# Patient Record
Sex: Female | Born: 1935 | Race: White | Hispanic: No | Smoking: Never smoker
Health system: Southern US, Community
[De-identification: ages and names within clinical notes are randomized; demographics above are authoritative.]

## PROBLEM LIST (undated history)

## (undated) DIAGNOSIS — L818 Other specified disorders of pigmentation: Secondary | ICD-10-CM

## (undated) DIAGNOSIS — I779 Disorder of arteries and arterioles, unspecified: Secondary | ICD-10-CM

## (undated) DIAGNOSIS — I10 Essential (primary) hypertension: Secondary | ICD-10-CM

## (undated) DIAGNOSIS — M1990 Unspecified osteoarthritis, unspecified site: Secondary | ICD-10-CM

## (undated) DIAGNOSIS — I209 Angina pectoris, unspecified: Secondary | ICD-10-CM

## (undated) DIAGNOSIS — I509 Heart failure, unspecified: Secondary | ICD-10-CM

## (undated) DIAGNOSIS — M8580 Other specified disorders of bone density and structure, unspecified site: Secondary | ICD-10-CM

## (undated) DIAGNOSIS — E785 Hyperlipidemia, unspecified: Secondary | ICD-10-CM

## (undated) DIAGNOSIS — K219 Gastro-esophageal reflux disease without esophagitis: Secondary | ICD-10-CM

## (undated) DIAGNOSIS — I2510 Atherosclerotic heart disease of native coronary artery without angina pectoris: Secondary | ICD-10-CM

## (undated) DIAGNOSIS — I340 Nonrheumatic mitral (valve) insufficiency: Secondary | ICD-10-CM

## (undated) DIAGNOSIS — I5030 Unspecified diastolic (congestive) heart failure: Secondary | ICD-10-CM

## (undated) DIAGNOSIS — R0602 Shortness of breath: Secondary | ICD-10-CM

## (undated) HISTORY — DX: Heart failure, unspecified: I50.9

## (undated) HISTORY — DX: Disorder of arteries and arterioles, unspecified: I77.9

## (undated) HISTORY — DX: Other specified disorders of pigmentation: L81.8

## (undated) HISTORY — DX: Hyperlipidemia, unspecified: E78.5

## (undated) HISTORY — DX: Nonrheumatic mitral (valve) insufficiency: I34.0

## (undated) HISTORY — PX: CATARACT EXTRACTION: SUR2

## (undated) HISTORY — DX: Essential (primary) hypertension: I10

## (undated) HISTORY — DX: Atherosclerotic heart disease of native coronary artery without angina pectoris: I25.10

## (undated) HISTORY — DX: Other specified disorders of bone density and structure, unspecified site: M85.80

---

## 1999-11-24 HISTORY — PX: CORONARY ANGIOPLASTY WITH STENT PLACEMENT: SHX49

## 2000-06-18 ENCOUNTER — Ambulatory Visit (HOSPITAL_COMMUNITY): Admission: RE | Admit: 2000-06-18 | Discharge: 2000-06-19 | Payer: Self-pay | Admitting: Cardiovascular Disease

## 2005-12-06 ENCOUNTER — Emergency Department (HOSPITAL_COMMUNITY): Admission: EM | Admit: 2005-12-06 | Discharge: 2005-12-06 | Payer: Self-pay | Admitting: Emergency Medicine

## 2006-04-02 ENCOUNTER — Ambulatory Visit: Payer: Self-pay | Admitting: Internal Medicine

## 2006-06-18 ENCOUNTER — Ambulatory Visit: Payer: Self-pay | Admitting: Family Medicine

## 2006-07-01 ENCOUNTER — Ambulatory Visit: Payer: Self-pay | Admitting: Family Medicine

## 2006-07-30 ENCOUNTER — Ambulatory Visit: Payer: Self-pay | Admitting: Family Medicine

## 2006-08-05 ENCOUNTER — Encounter (INDEPENDENT_AMBULATORY_CARE_PROVIDER_SITE_OTHER): Payer: Self-pay | Admitting: Specialist

## 2006-08-05 ENCOUNTER — Ambulatory Visit: Payer: Self-pay | Admitting: Internal Medicine

## 2006-09-06 ENCOUNTER — Ambulatory Visit: Payer: Self-pay | Admitting: Internal Medicine

## 2006-09-17 ENCOUNTER — Ambulatory Visit: Payer: Self-pay | Admitting: Internal Medicine

## 2006-10-22 ENCOUNTER — Ambulatory Visit: Payer: Self-pay | Admitting: Internal Medicine

## 2006-11-05 ENCOUNTER — Ambulatory Visit: Payer: Self-pay | Admitting: Internal Medicine

## 2006-12-18 DIAGNOSIS — M949 Disorder of cartilage, unspecified: Secondary | ICD-10-CM

## 2006-12-18 DIAGNOSIS — M899 Disorder of bone, unspecified: Secondary | ICD-10-CM

## 2006-12-18 DIAGNOSIS — M8580 Other specified disorders of bone density and structure, unspecified site: Secondary | ICD-10-CM | POA: Insufficient documentation

## 2007-09-21 ENCOUNTER — Encounter (INDEPENDENT_AMBULATORY_CARE_PROVIDER_SITE_OTHER): Payer: Self-pay | Admitting: Family Medicine

## 2007-09-21 ENCOUNTER — Ambulatory Visit: Payer: Self-pay | Admitting: Family Medicine

## 2007-09-21 DIAGNOSIS — M25519 Pain in unspecified shoulder: Secondary | ICD-10-CM | POA: Insufficient documentation

## 2007-09-26 ENCOUNTER — Telehealth (INDEPENDENT_AMBULATORY_CARE_PROVIDER_SITE_OTHER): Payer: Self-pay | Admitting: *Deleted

## 2008-02-21 ENCOUNTER — Emergency Department (HOSPITAL_COMMUNITY): Admission: EM | Admit: 2008-02-21 | Discharge: 2008-02-21 | Payer: Self-pay | Admitting: Emergency Medicine

## 2008-11-02 ENCOUNTER — Other Ambulatory Visit: Admission: RE | Admit: 2008-11-02 | Discharge: 2008-11-02 | Payer: Self-pay | Admitting: Gynecology

## 2008-11-02 ENCOUNTER — Encounter: Payer: Self-pay | Admitting: Gynecology

## 2008-11-02 ENCOUNTER — Ambulatory Visit: Payer: Self-pay | Admitting: Gynecology

## 2008-11-05 ENCOUNTER — Encounter: Payer: Self-pay | Admitting: Internal Medicine

## 2008-12-05 DIAGNOSIS — K5730 Diverticulosis of large intestine without perforation or abscess without bleeding: Secondary | ICD-10-CM | POA: Insufficient documentation

## 2008-12-05 DIAGNOSIS — J45909 Unspecified asthma, uncomplicated: Secondary | ICD-10-CM | POA: Insufficient documentation

## 2008-12-05 DIAGNOSIS — E785 Hyperlipidemia, unspecified: Secondary | ICD-10-CM | POA: Insufficient documentation

## 2008-12-05 DIAGNOSIS — K921 Melena: Secondary | ICD-10-CM | POA: Insufficient documentation

## 2008-12-10 ENCOUNTER — Ambulatory Visit: Payer: Self-pay | Admitting: Gynecology

## 2008-12-10 ENCOUNTER — Ambulatory Visit: Payer: Self-pay | Admitting: Internal Medicine

## 2009-11-08 ENCOUNTER — Ambulatory Visit: Payer: Self-pay | Admitting: Gynecology

## 2009-11-08 ENCOUNTER — Other Ambulatory Visit: Admission: RE | Admit: 2009-11-08 | Discharge: 2009-11-08 | Payer: Self-pay | Admitting: Gynecology

## 2010-12-12 ENCOUNTER — Other Ambulatory Visit
Admission: RE | Admit: 2010-12-12 | Discharge: 2010-12-12 | Payer: Self-pay | Source: Home / Self Care | Admitting: Gynecology

## 2010-12-12 ENCOUNTER — Ambulatory Visit
Admission: RE | Admit: 2010-12-12 | Discharge: 2010-12-12 | Payer: Self-pay | Source: Home / Self Care | Attending: Gynecology | Admitting: Gynecology

## 2010-12-12 ENCOUNTER — Other Ambulatory Visit: Payer: Self-pay | Admitting: Gynecology

## 2010-12-25 ENCOUNTER — Ambulatory Visit (INDEPENDENT_AMBULATORY_CARE_PROVIDER_SITE_OTHER): Payer: Medicare Other | Admitting: Gynecology

## 2010-12-25 ENCOUNTER — Ambulatory Visit (INDEPENDENT_AMBULATORY_CARE_PROVIDER_SITE_OTHER): Payer: Medicare Other | Admitting: Cardiovascular Disease

## 2010-12-25 ENCOUNTER — Other Ambulatory Visit: Payer: Self-pay | Admitting: Gynecology

## 2010-12-25 DIAGNOSIS — N72 Inflammatory disease of cervix uteri: Secondary | ICD-10-CM

## 2010-12-25 DIAGNOSIS — I2510 Atherosclerotic heart disease of native coronary artery without angina pectoris: Secondary | ICD-10-CM

## 2010-12-25 DIAGNOSIS — I119 Hypertensive heart disease without heart failure: Secondary | ICD-10-CM

## 2010-12-25 DIAGNOSIS — N7689 Other specified inflammation of vagina and vulva: Secondary | ICD-10-CM

## 2011-04-10 NOTE — Cardiovascular Report (Signed)
[place]  Patient:    [name], [name]                    MRN: [id number] Proc. Date: 06/18/00 Adm. Date:  [id number] Attending:  Koren Bound CC:         Cardiac Catheterization Laboratory             Gabriel Earing, M.D.                        Cardiac Catheterization  INDICATIONS:  [name] is a 77 year old female who was referred with symptoms of unstable angina.  She is referred for heart catheterization for further evaluation.  PROCEDURES:  Left heart catheterization with percutaneous transluminal coronary angioplasty and stenting of her right coronary artery.  DESCRIPTION OF PROCEDURE:  The right femoral artery was easily cannulated using the modified Seldinger technique.  HEMODYNAMICS:  The left ventricular pressure is 110/15 with an aortic pressure of 116/58.  ANGIOGRAPHY:  The left main coronary artery is relatively smooth and normal.  The left anterior descending artery has mild irregularities between 10% and 20%.  There is several moderate sized diagonal branches which have only minor luminal irregularities.  The LAD gives off two diagonal branches and then tapers fairly quickly to a fairly small branch.  It gives off multiple septal branches, each of which are normal.  The left circumflex artery is a moderate to large vessel.  There is a proximal 30% stenosis which is not flow-limiting.  The circumflex artery gives off a first obtuse marginal artery which has a 30-40% stenosis in the proximal aspect.  The distal left circumflex has a 50% stenosis just after giving off the first OM.  The left circumflex artery gives off a posterolateral segment artery which is unremarkable.  The right coronary artery is large and dominant.  There is a tight 99% stenosis in the mid vessel.  There is TIMI grade 2 flow down this vessel.  The posterior descending artery and posterolateral segment artery have only minor luminal  irregularities.  There appears to be a 30% stenosis just proximal fibrillation to this tight 99% stenosis.  LEFT VENTRICULOGRAM:  The left ventriculogram was performed in a 30 RAO position.  It reveals overall well-preserved left ventricular systolic function.  There is mild hypokinesis involving the inferior wall.  There is mild mitral regurgitation.  PERCUTANEOUS TRANSLUMINAL CORONARY ANGIOPLASTY:  Because of the patients severe coronary artery disease, we elected to proceed with angioplasty and stent procedure.  The patient was given 4100 units of heparin.  The ACT was 268 seconds.  A double bolus Integrilin drip was given.  The right coronary artery was engaged using a 7 [place] Judkins right 4 guide.  The right coronary artery was easily wired under fluoroscopy using a Patriot guidewire.  A 3.75 x 15 mm Maverick balloon was positioned across the stenosis.  It was inflated once up to 4 atmospheres for 40 seconds.  This resulted in a greatly improved vessel lumen with approximately a 50% residual stenosis.  There was no evidence of edge dissection.  A followup angiography revealed that the plaque burden extended approximately 10 mm up beyond the original stenosis.  At this point, a 3.5 x 23 mm Tetra stent was positioned across the whole length of the plaque. It was deployed at 12 atmospheres for 55 seconds which resulted in a very nice angiographic lumen.  It did appear that  there was a slight dog bone type stenosis in the middle of the stent and along the proximal edge.  At this point the 3.75 x 15 mm Maverick was repositioned back into the midportion of the stent.  It was inflated up to 10 atmospheres for 31 seconds in the mid stent and then pulled back to the proximal edge.  It was inflated there at 10 atmospheres for 30 seconds as well.  This gives a final size of 3.95 mm. Followup angiography revealed a very nice lumen with a nice step-up and step-down.  There was TIMI grade 3  flow.  There was no evidence of edge dissection.  The patient tolerated the procedure quite well.  COMPLICATIONS:  None.  CONCLUSIONS: 1. Successful percutaneous transluminal coronary angioplasty and stenting    of the right coronary artery. 2. Mild irregularities involving the other coronary arteries. DD:  06/18/00 TD:  06/19/00 Job: [id number]

## 2011-04-10 NOTE — Assessment & Plan Note (Signed)
[facility]                             PULMONARY OFFICE NOTE   NAME:HURLOCKERBreyana, [name]                    MRN:          [id number]  DATE:10/22/2006                            DOB:          1928/06/30    HISTORY:  This is a 77 year old white female initially seen on September 06, 2006 for a cough that dates back since she was 77 years old,  associated with nasal congestion and sensation of post-nasal drainage.  I recommended a sinus CT scan, which has returned negative for  significant abnormalities, effective September 17, 2006, and recommended  empiric treatment directed at rhinitis and reflux, as well as cyclical  coughing (see previous evaluation September 06, 2006).   She returns today stating she is only a little bit better and was not  able to actually take the medications as they were recommended (see  comments below).  She did take Protonix before breakfast only, not  before supper, and did not stop the Fosamax as recommended.   MEDICATIONS AT PRESENT:  Still consist of Fosamax and atenolol, as well  as Diovan, aspirin, and Rhinabid.   PHYSICAL EXAMINATION:  She is a depressed-appearing ambulatory white  female with somewhat of a helpless, hopeless affect and attitude.  VITAL SIGNS:  Stable vital signs.  HEENT:  Minimal turbinate edema.  Oropharynx is clear.  There is no  evidence of significant post-nasal drainage or cobblestoning.  NECK:  Supple without cervical adenopathy or tenderness. Trachea is  midline.  No thyromegaly.  LUNGS:  Fields are perfectly clear bilaterally to auscultation and  percussion.  There is no cough or wheeze on inspiratory or expiratory  maneuvers.  CARDIAC:  Regular rate and rhythm without murmur, gallop, or rub.  ABDOMEN:  Soft and benign.  EXTREMITIES:  Warm without calf tenderness, cyanosis, clubbing, or  edema.   IMPRESSION:  Cyclical upper airway coughing that is somewhat better  with treatment directed at  rhinitis and reflux, but she really did not  get the message that I hoped she would in terms of the nature of  empiric treatment for cyclical coughing, and only followed half the  instructions she was given.   I, therefore, spent an extra 15 to 20 minutes with her today going over  the same information I did on the previous visit, including the eye  doctor analogy to help her understand that her response to therapy will  depend on a successful outcome for treatment of cough that dates back to  age 55 will depend on her being straight-forward about not only her  taking the medicines I recommend consistently than returning here for  the next step using the 14-step algorithm designed by Dr. Stark Falls at [facility] to whom I offered to refer her  (having never encountered a patient before who was coughing since the  age of 59).   However, she agreed to try to work with me and, therefore, I recommended  the following.  Rather than redesign the wheel I simply showed her the instructions  she received last time and asked her  to take the medicines exactly as  they were prescribed.  Namely, I have asked her to stop the Fosamax for  now and take Protonix consistently before meals twice daily (enough  samples given for the next 2 weeks), to take Drixoral Cold and Allergy  b.i.d. (because she cannot afford Bromfed PD), and to use a 6-day course  of prednisone to gain control over the inflammatory component to the  problem.  Followup will be in 2 weeks, at which time the next logical  step in the workup would be to consider adding Reglan to her regimen,  and also doing more in terms of eliminating the cycle of coughing with  perhaps a course of tramadol and, perhaps, the next step after that a  methacholine challenge step to rule out asthma (but only after reflux  and rhinitis have been treated aggressively consistently).     Charlaine Dalton. Sherene Sires, MD, [facility]  Electronically  Signed    MBW/MedQ  DD: 10/22/2006  DT: 10/24/2006  Job #: [id number]   cc:   Leanne Chang, M.D.

## 2011-04-10 NOTE — Assessment & Plan Note (Signed)
[facility]                               PULMONARY OFFICE NOTE   NAME:[name], [name]                    MRN:          [id number]  DATE:09/06/2006                            DOB:          1928-03-01    HISTORY:  A 77 year old white female who states that she has had cough on  and off since she was 77 years old associated with nasal congestion and a  sensation of postnasal drainage that seems somewhat better from shots that  she received from Dr. Colvin Caroli from 1971 to 1991 and definitely worse since  May of 2007 with now daily dry coughing that is worse as the day goes on.  It is not necessarily waking her from her sleep and associated with only a  minimal increase in nasal congestion but a sensation of something draining  down the back of her throat.  She actually does not bring up excessive  mucus or having any nocturnal disturbance once she gets to sleep related to  the complaint.  She also denies any associated dyspnea, chest pain, fevers,  chills, sweats, without any PND, leg swelling, or overt reflux or sinus  symptoms.   She has, so far, failed to respond to various nasal preparations (she could  not tell me which ones but says nothing works or reflux medicines).  She  remembers she took Protonix and it did not help and did the best with  Allerest, which she said she could not take because it made her muscles  ache.  Does not seem to improve as much with Claritin.   PAST MEDICAL HISTORY:  Significant for:  1. Asthma.  2. Hyperlipidemia.  3. Sinus problems.   MEDICATIONS:  Diovan, atenolol, aspirin, and Fosamax for about 4 years.   SOCIAL HISTORY:  She has never smoked.  She denies any unusual travel, pet,  or hobby exposure.   FAMILY HISTORY:  Significant for allergies in her daughter and her father.   REVIEW OF SYSTEMS:  Taken in detail on the worksheet, significant for  problems as outlined above.   PHYSICAL EXAMINATION:  This is  an anxious white female who had a very  difficult time answering questions in a straight forward fashion.  She is afebrile on vital signs.  HEENT:  Unremarkable.  She is edentulous.  There is mild to moderate  turbinate edema with minimal pallor.  Ear canals clear bilaterally.  NECK:  Supple without cervical adenopathy or tenderness.  Trachea is midline  with no megaly.  LUNGS:  Fields are perfectly clear bilaterally to auscultation and  percussion with no cough elicited on inspiratory, expiratory maneuvers.  HEART:  Regular rhythm without murmur, gallop, or rub.  ABDOMEN:  Soft, benign.  EXTREMITIES:  Warm without calf tenderness, cyanosis, clubbing, or edema.   Chest x-ray was performed by Dr. Blossom Hoops within the last month and is  reportedly normal but not available.   Sinus CT scan was requested today.   IMPRESSION:  Chronic cough in a background setting of perennial rhinitis for  which Allerest seemed to be the best treatment but cannot  be tolerated.  This raises the issue why suddenly, given the background pattern of  rhinitis, she is now evolved to a refractory cough.  This would either be  because the rhinitis is out of control (which does not seem to be the case  clinically since she can sleep without excessive mucus drainage) or she has  a second problem like reflux that is exacerbated by coughing.   That is, I suspect that perennial rhinitis has caused upper airway cough  syndrome which is exacerbated by the fact that she is coughing so much she  has secondary reflux.   I attempted to help the patient understand the complexity of managing  chronic cough but made very little headway using any of the usual analogies  I use and could not seem to get the patient to understand the nature of  empiric trials of therapy or sense that she was going to enthusiastically  embrace anything that I had to say today.  However, I did the best I could  to reach an understanding with her  that regarding the nature of empiric  therapy based on the recently published cough guidelines and recommended the  following specifics today:   1. Take Protonix every single day before breakfast and before supper for      the next 2 weeks and then return here for followup while eliminating      Fosamax from her regimen (since it may be contributing to reflux) in      the short run only.  2. Tried to help her understand the cyclical nature of her chronic cough      with the recommendation that she use prednisone for 6 days only to gain      control of inflammation caused by the cough and suppress postnasal drip      syndrome with Bromfed-PD 1 b.i.d. (since she has found that Claritin, a      nonsedating, and therefore a non-anticholinergic antihistamine) is not      effective in controlling the cough.  3. Followup will be in 2 weeks with a sinus CT scan to be reviewed in the      meantime.            ______________________________  [name], MD, [facility]      MBW/MedQ  DD:  09/06/2006  DT:  09/07/2006  Job #:  [id number]   cc:   Leanne Chang, M.D.

## 2011-04-10 NOTE — Assessment & Plan Note (Signed)
[facility]                             PULMONARY OFFICE NOTE   NAME:[name], [name]                    MRN:          [id number]  DATE:11/05/2006                            DOB:          February 13, 1928    HISTORY OF PRESENT ILLNESS:  The patient is a 77 year old white female  patient of Dr. Thurston Hole who has a known history of cyclical upper airway  coughing that returns for a 2-week followup.  Last visit, the patient  had been started on the prednisone taper, adding in Drixoral Cold and  Allergy and Protonix with increase up to twice a day.  The patient  returns today reporting that she has not had any significant improvement  in her symptoms.  Still feels like she has a tickle in the back of her  throat requiring her to cough quite often.  The patient did undergo a CT  of the sinuses in October which was essentially unremarkable and a  previous endoscopy in September 2007 which showed a normal exam.   PAST MEDICAL HISTORY:  The patient denies any purulent sputum, fever,  chest pain, orthopnea, PND, or leg swelling.   PAST MEDICAL HISTORY:  Reviewed.   CURRENT MEDICATIONS:  Reviewed.   PHYSICAL EXAM:  The patient is a pleasant female in no acute distress.  Temperature 100.7.  Blood pressure is 128/64.  O2 saturation is 94% on  room air.  HEENT:  Nasal mucosa was somewhat pale, nontender sinuses to percussion.  Posterior pharynx clear.  NECK:  Supple without adenopathy.  No JVD.  Lung sounds are clear without any wheezes.  No crackles.  CARDIAC:  Regular rate and rhythm.  ABDOMEN:  Soft, benign.  EXTREMITIES:  Warm without any edema.   IMPRESSION AND PLAN:  Upper airway cyclical cough.  The patient is to  continue on her aggressive cough suppressive regimen.  She will add in  Delsym 2 teaspoons twice daily and may use tramadol q. 4 p.r.n. to stop  coughing.  She will continue on Protonix twice daily and  Drixoral Cold and Allergy as needed for  postnasal drip and throat  clearing.  The patient will continue to hold on Fosamax and recheck here  in 2-3 weeks with Dr. Sherene [name] or sooner if needed.      Rubye Oaks, NP  Electronically Signed      [name]. Cynthia Sires, MD, [facility]  Electronically Signed   TP/MedQ  DD: 11/05/2006  DT: 11/05/2006  Job #: [id number]

## 2011-04-21 ENCOUNTER — Other Ambulatory Visit: Payer: Self-pay | Admitting: Cardiovascular Disease

## 2011-04-21 NOTE — Telephone Encounter (Signed)
Chart said dose is 25mg  and request is 50mg , msg left to call us with clarification.Alfonso Ramus RN

## 2011-04-22 ENCOUNTER — Other Ambulatory Visit: Payer: Self-pay | Admitting: *Deleted

## 2011-04-22 MED ORDER — ATENOLOL 50 MG PO TABS: 50.0000 mg | ORAL_TABLET | Freq: Every day | ORAL | Status: DC

## 2011-04-22 NOTE — Telephone Encounter (Signed)
Spoke with pharmacy and the pt has been on tenormin 50mg  daily for a long time, no gaps in refills no breaking tab in half, order refilled as faxed. Unable to reach pt.Alfonso Ramus RN

## 2011-04-22 NOTE — Telephone Encounter (Signed)
msg left for her to return call re med dose.Alfonso Ramus RN

## 2011-04-24 ENCOUNTER — Telehealth: Payer: Self-pay | Admitting: Cardiovascular Disease

## 2011-04-24 NOTE — Telephone Encounter (Signed)
PT SAID SOMEONE CALLED HER FROM OUR # AND SHE IS NOT SURE WHY. WANTS A CALL BACK.

## 2011-05-21 ENCOUNTER — Other Ambulatory Visit: Payer: Self-pay | Admitting: Cardiology

## 2011-05-21 NOTE — Telephone Encounter (Signed)
Med refill

## 2011-05-22 ENCOUNTER — Encounter: Payer: Self-pay | Admitting: *Deleted

## 2011-05-26 ENCOUNTER — Other Ambulatory Visit: Payer: Self-pay | Admitting: *Deleted

## 2011-05-26 DIAGNOSIS — E785 Hyperlipidemia, unspecified: Secondary | ICD-10-CM

## 2011-05-28 ENCOUNTER — Other Ambulatory Visit: Payer: Self-pay | Admitting: Cardiovascular Disease

## 2011-05-28 ENCOUNTER — Encounter: Payer: Self-pay | Admitting: Cardiovascular Disease

## 2011-05-28 ENCOUNTER — Other Ambulatory Visit (INDEPENDENT_AMBULATORY_CARE_PROVIDER_SITE_OTHER): Payer: Medicare Other | Admitting: *Deleted

## 2011-05-28 ENCOUNTER — Ambulatory Visit (INDEPENDENT_AMBULATORY_CARE_PROVIDER_SITE_OTHER): Payer: Medicare Other | Admitting: Cardiovascular Disease

## 2011-05-28 VITALS — BP 138/80 | HR 60 | Wt 146.0 lb

## 2011-05-28 DIAGNOSIS — E785 Hyperlipidemia, unspecified: Secondary | ICD-10-CM

## 2011-05-28 DIAGNOSIS — I2510 Atherosclerotic heart disease of native coronary artery without angina pectoris: Secondary | ICD-10-CM | POA: Insufficient documentation

## 2011-05-28 LAB — BASIC METABOLIC PANEL
CO2: 29 mEq/L (ref 19–32)
Calcium: 9 mg/dL (ref 8.4–10.5)
Chloride: 101 mEq/L (ref 96–112)
Sodium: 136 mEq/L (ref 135–145)

## 2011-05-28 LAB — LIPID PANEL
HDL: 44.6 mg/dL (ref >39.00)
Total CHOL/HDL Ratio: 5
VLDL: 19.2 mg/dL (ref 0.0–40.0)

## 2011-05-28 LAB — LDL CHOLESTEROL, DIRECT: Direct LDL: 155.6 mg/dL

## 2011-05-28 LAB — HEPATIC FUNCTION PANEL
Alkaline Phosphatase: 43 U/L (ref 39–117)
Bilirubin, Direct: 0 mg/dL (ref 0.0–0.3)
Total Bilirubin: 0.5 mg/dL (ref 0.3–1.2)
Total Protein: 7.3 g/dL (ref 6.0–8.3)

## 2011-05-28 NOTE — Assessment & Plan Note (Signed)
Mrs. [name] is doing very well. She has not had any episodes of angina. She still remains fairly active. We'll continue with her same medications. I'll see her again in 6 months for office visit, lipid profile, basic metabolic profile, and HFP.

## 2011-05-28 NOTE — Progress Notes (Signed)
[name] Date of Birth  05-30-1928 [facility] [address]     [address] [place], [place]  [address] [phone]  Fax  [phone]  History of Present Illness:  Pt has a history of CAD - s/p stenting of the RCA in 2001.  No recent episodes of angina.  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  She gets fair amount of exercise.   Current Outpatient Prescriptions on File Prior to Visit  Medication Sig Dispense Refill  . aspirin 81 MG tablet Take 81 mg by mouth daily.        Marland Kitchen DIOVAN 160 MG tablet TAKE ONE TABLET BY MOUTH EVERY DAY  30 each  PRN  . DISCONTD: atenolol (TENORMIN) 50 MG tablet Take 1 tablet (50 mg total) by mouth daily.  90 tablet  1    Allergies  Allergen Reactions  . Codeine   . Crestor (Rosuvastatin Calcium)   . Pantoprazole Sodium     REACTION: cough  . Pravachol   . Zocor (Simvastatin - High Dose)     Past Medical History  Diagnosis Date  . Hypertension   . Coronary artery disease     status post PTCA and stenting of right coronary artery  . Asthma   . CHF (congestive heart failure)     Past Surgical History  Procedure Date  . Coronary angioplasty with stent placement     coronary angioplasty & stenting of right coronary artery  -- Mild irregularities involving the other coronary arteries    History  Smoking status  . Never Smoker   Smokeless tobacco  . Not on file    History  Alcohol Use No    Family History  Problem Relation Age of Onset  . Coronary artery disease Mother   . Cancer Brother   . Cancer Brother   . Cancer Son   . Cancer Son   . Cancer Son   . Diabetes Child     Reviw of Systems:  Reviewed in the HPI.  All other systems are negative.  Physical Exam: BP 138/80  Pulse 60  Wt 146 lb (66.225 kg) The patient is alert and oriented x 3.  The mood and affect are normal.   Skin: warm and dry.  Color is normal.    HEENT:    the sclera are nonicteric.  The mucous membranes are moist.  The carotids are 2+ without bruits.  There is no thyromegaly.  There is no JVD.    Lungs: clear.  The chest wall is non tender.    Heart: regular rate with a normal S1 and S2.  There are no murmurs, gallops, or rubs. The PMI is not displaced.     Abdomin: good bowel sounds.  There is no guarding or rebound.  There is no hepatosplenomegaly or tenderness.  There are no masses.   Extremities:  no clubbing, cyanosis, or edema.  The legs are without rashes.  The distal pulses are intact.   Neuro:  Cranial nerves II - XII are intact.  Motor and sensory functions are intact.    The gait is normal.  Assessment / Plan:

## 2011-06-01 ENCOUNTER — Telehealth: Payer: Self-pay | Admitting: *Deleted

## 2011-06-01 NOTE — Telephone Encounter (Signed)
Spoke w/ grand daughter pt at work, told her all her results were good and to call back with questions, i had tried several attempts prior.

## 2011-08-17 LAB — POCT I-STAT, CHEM 8
BUN: 14
Chloride: 100
Potassium: 4
Sodium: 138

## 2011-08-17 LAB — CBC
HCT: 36.4
Hemoglobin: 12.5
MCV: 90.8
Platelets: 149 — ABNORMAL LOW
WBC: 9.7

## 2011-08-17 LAB — POCT CARDIAC MARKERS
CKMB, poc: 1
Myoglobin, poc: 65
Operator id: 294521
Troponin i, poc: <0.05
Troponin i, poc: <0.05

## 2011-08-17 LAB — D-DIMER, QUANTITATIVE: D-Dimer, Quant: 2.27 — ABNORMAL HIGH

## 2011-11-25 ENCOUNTER — Inpatient Hospital Stay (HOSPITAL_COMMUNITY)
Admission: EM | Admit: 2011-11-25 | Discharge: 2011-11-29 | DRG: 815 | Disposition: A | Discharge status: Home / Self Care | Payer: No Typology Code available for payment source | Attending: General Surgery | Admitting: General Surgery

## 2011-11-25 ENCOUNTER — Other Ambulatory Visit: Payer: Self-pay

## 2011-11-25 ENCOUNTER — Encounter (HOSPITAL_COMMUNITY): Payer: Self-pay | Admitting: *Deleted

## 2011-11-25 ENCOUNTER — Emergency Department (HOSPITAL_COMMUNITY): Payer: No Typology Code available for payment source

## 2011-11-25 DIAGNOSIS — K921 Melena: Secondary | ICD-10-CM

## 2011-11-25 DIAGNOSIS — S060XAA Concussion with loss of consciousness status unknown, initial encounter: Secondary | ICD-10-CM | POA: Diagnosis present

## 2011-11-25 DIAGNOSIS — S36039A Unspecified laceration of spleen, initial encounter: Secondary | ICD-10-CM

## 2011-11-25 DIAGNOSIS — K5730 Diverticulosis of large intestine without perforation or abscess without bleeding: Secondary | ICD-10-CM

## 2011-11-25 DIAGNOSIS — S060X9A Concussion with loss of consciousness of unspecified duration, initial encounter: Secondary | ICD-10-CM

## 2011-11-25 DIAGNOSIS — Z833 Family history of diabetes mellitus: Secondary | ICD-10-CM

## 2011-11-25 DIAGNOSIS — S0101XA Laceration without foreign body of scalp, initial encounter: Secondary | ICD-10-CM

## 2011-11-25 DIAGNOSIS — Z8249 Family history of ischemic heart disease and other diseases of the circulatory system: Secondary | ICD-10-CM

## 2011-11-25 DIAGNOSIS — R079 Chest pain, unspecified: Secondary | ICD-10-CM

## 2011-11-25 DIAGNOSIS — S36030A Superficial (capsular) laceration of spleen, initial encounter: Principal | ICD-10-CM | POA: Diagnosis present

## 2011-11-25 DIAGNOSIS — S2691XA Contusion of heart, unspecified with or without hemopericardium, initial encounter: Secondary | ICD-10-CM

## 2011-11-25 DIAGNOSIS — I517 Cardiomegaly: Secondary | ICD-10-CM

## 2011-11-25 DIAGNOSIS — K219 Gastro-esophageal reflux disease without esophagitis: Secondary | ICD-10-CM

## 2011-11-25 DIAGNOSIS — S0100XA Unspecified open wound of scalp, initial encounter: Secondary | ICD-10-CM | POA: Diagnosis present

## 2011-11-25 DIAGNOSIS — Z7982 Long term (current) use of aspirin: Secondary | ICD-10-CM

## 2011-11-25 DIAGNOSIS — Z888 Allergy status to other drugs, medicaments and biological substances status: Secondary | ICD-10-CM

## 2011-11-25 DIAGNOSIS — S2242XA Multiple fractures of ribs, left side, initial encounter for closed fracture: Secondary | ICD-10-CM | POA: Diagnosis present

## 2011-11-25 DIAGNOSIS — I2510 Atherosclerotic heart disease of native coronary artery without angina pectoris: Secondary | ICD-10-CM

## 2011-11-25 DIAGNOSIS — M899 Disorder of bone, unspecified: Secondary | ICD-10-CM

## 2011-11-25 DIAGNOSIS — S060X0A Concussion without loss of consciousness, initial encounter: Secondary | ICD-10-CM | POA: Diagnosis present

## 2011-11-25 DIAGNOSIS — Z9861 Coronary angioplasty status: Secondary | ICD-10-CM

## 2011-11-25 DIAGNOSIS — I10 Essential (primary) hypertension: Secondary | ICD-10-CM | POA: Diagnosis present

## 2011-11-25 DIAGNOSIS — E785 Hyperlipidemia, unspecified: Secondary | ICD-10-CM

## 2011-11-25 DIAGNOSIS — Z8781 Personal history of (healed) traumatic fracture: Secondary | ICD-10-CM | POA: Diagnosis present

## 2011-11-25 DIAGNOSIS — Z79899 Other long term (current) drug therapy: Secondary | ICD-10-CM

## 2011-11-25 DIAGNOSIS — M25519 Pain in unspecified shoulder: Secondary | ICD-10-CM

## 2011-11-25 DIAGNOSIS — S2249XA Multiple fractures of ribs, unspecified side, initial encounter for closed fracture: Secondary | ICD-10-CM

## 2011-11-25 DIAGNOSIS — M949 Disorder of cartilage, unspecified: Secondary | ICD-10-CM | POA: Diagnosis present

## 2011-11-25 DIAGNOSIS — Y92410 Unspecified street and highway as the place of occurrence of the external cause: Secondary | ICD-10-CM

## 2011-11-25 DIAGNOSIS — IMO0002 Reserved for concepts with insufficient information to code with codable children: Secondary | ICD-10-CM | POA: Diagnosis present

## 2011-11-25 DIAGNOSIS — J45909 Unspecified asthma, uncomplicated: Secondary | ICD-10-CM

## 2011-11-25 HISTORY — DX: Unspecified laceration of spleen, initial encounter: S36.039A

## 2011-11-25 LAB — COMPREHENSIVE METABOLIC PANEL
AST: 33 U/L (ref 0–37)
Albumin: 3.2 g/dL — ABNORMAL LOW (ref 3.5–5.2)
Calcium: 8.8 mg/dL (ref 8.4–10.5)
Creatinine, Ser: 0.77 mg/dL (ref 0.50–1.10)

## 2011-11-25 LAB — CARDIAC PANEL(CRET KIN+CKTOT+MB+TROPI)
Relative Index: 2.4 (ref 0.0–2.5)
Total CK: 376 U/L — ABNORMAL HIGH (ref 7–177)
Total CK: 395 U/L — ABNORMAL HIGH (ref 7–177)

## 2011-11-25 LAB — CBC
HCT: 34.1 % — ABNORMAL LOW (ref 36.0–46.0)
Hemoglobin: 12.4 g/dL (ref 12.0–15.0)
MCH: 30.8 pg (ref 26.0–34.0)
MCHC: 32.6 g/dL (ref 30.0–36.0)
MCV: 93.8 fL (ref 78.0–100.0)
RBC: 4.02 MIL/uL (ref 3.87–5.11)
RDW: 13.4 % (ref 11.5–15.5)
WBC: 11.2 10*3/uL — ABNORMAL HIGH (ref 4.0–10.5)

## 2011-11-25 LAB — GLUCOSE, CAPILLARY: Glucose-Capillary: 140 mg/dL — ABNORMAL HIGH (ref 70–99)

## 2011-11-25 LAB — POCT I-STAT, CHEM 8
BUN: 9 mg/dL (ref 6–23)
Calcium, Ion: 1.14 mmol/L (ref 1.12–1.32)
Chloride: 99 mEq/L (ref 96–112)
Glucose, Bld: 148 mg/dL — ABNORMAL HIGH (ref 70–99)
TCO2: 30 mmol/L (ref 0–100)

## 2011-11-25 LAB — MRSA PCR SCREENING: MRSA by PCR: NEGATIVE

## 2011-11-25 LAB — URINALYSIS, MICROSCOPIC ONLY
Bilirubin Urine: NEGATIVE
Glucose, UA: NEGATIVE mg/dL
Ketones, ur: NEGATIVE mg/dL
Leukocytes, UA: NEGATIVE
Specific Gravity, Urine: 1.01 (ref 1.005–1.030)
pH: 7.5 (ref 5.0–8.0)

## 2011-11-25 LAB — TROPONIN I: Troponin I: <0.3 ng/mL (ref <0.30)

## 2011-11-25 LAB — LACTIC ACID, PLASMA: Lactic Acid, Venous: 2.4 mmol/L — ABNORMAL HIGH (ref 0.5–2.2)

## 2011-11-25 LAB — SAMPLE TO BLOOD BANK

## 2011-11-25 LAB — PROTIME-INR: Prothrombin Time: 12.4 seconds (ref 11.6–15.2)

## 2011-11-25 MED ORDER — TETANUS-DIPHTH-ACELL PERTUSSIS 5-2.5-18.5 LF-MCG/0.5 IM SUSP
INTRAMUSCULAR | Status: AC
  Administered 2011-11-25: 0.5 mL via INTRAMUSCULAR
  Filled 2011-11-25: qty 0.5

## 2011-11-25 MED ORDER — HYDROCODONE-ACETAMINOPHEN 5-325 MG PO TABS
1.0000 | ORAL_TABLET | ORAL | Status: DC | PRN
  Administered 2011-11-25 – 2011-11-29 (×15): 1 via ORAL
  Filled 2011-11-25 (×14): qty 1

## 2011-11-25 MED ORDER — ONDANSETRON HCL 4 MG PO TABS
4.0000 mg | ORAL_TABLET | Freq: Four times a day (QID) | ORAL | Status: DC | PRN
  Administered 2011-11-25: 4 mg via ORAL
  Filled 2011-11-25: qty 1

## 2011-11-25 MED ORDER — OLMESARTAN MEDOXOMIL 20 MG PO TABS
20.0000 mg | ORAL_TABLET | Freq: Every day | ORAL | Status: DC
  Administered 2011-11-26 – 2011-11-29 (×4): 20 mg via ORAL
  Filled 2011-11-25 (×5): qty 1

## 2011-11-25 MED ORDER — TETANUS-DIPHTHERIA TOXOIDS TD 5-2 LFU IM INJ: 0.5000 mL | INJECTION | Freq: Once | INTRAMUSCULAR | Status: DC

## 2011-11-25 MED ORDER — BACITRACIN ZINC 500 UNIT/GM EX OINT
TOPICAL_OINTMENT | Freq: Two times a day (BID) | CUTANEOUS | Status: DC
  Administered 2011-11-25 – 2011-11-29 (×9): via TOPICAL
  Filled 2011-11-25: qty 15

## 2011-11-25 MED ORDER — NON FORMULARY: 10.0000 mg | Freq: Every day | Status: DC

## 2011-11-25 MED ORDER — LIDOCAINE-EPINEPHRINE 1 %-1:100000 IJ SOLN
INTRAMUSCULAR | Status: AC
  Filled 2011-11-25: qty 2

## 2011-11-25 MED ORDER — ONDANSETRON HCL 4 MG/2ML IJ SOLN
4.0000 mg | Freq: Four times a day (QID) | INTRAMUSCULAR | Status: DC | PRN
  Administered 2011-11-25: 4 mg via INTRAVENOUS
  Filled 2011-11-25: qty 2

## 2011-11-25 MED ORDER — IOHEXOL 300 MG/ML  SOLN
80.0000 mL | Freq: Once | INTRAMUSCULAR | Status: AC | PRN
  Administered 2011-11-25: 80 mL via INTRAVENOUS

## 2011-11-25 MED ORDER — TRAMADOL HCL 50 MG PO TABS
50.0000 mg | ORAL_TABLET | Freq: Four times a day (QID) | ORAL | Status: DC
  Administered 2011-11-25 – 2011-11-29 (×15): 50 mg via ORAL
  Filled 2011-11-25 (×20): qty 1

## 2011-11-25 MED ORDER — CEFAZOLIN SODIUM 1-5 GM-% IV SOLN
INTRAVENOUS | Status: AC
  Administered 2011-11-25: 1 g via INTRAVENOUS
  Filled 2011-11-25: qty 50

## 2011-11-25 MED ORDER — OMEPRAZOLE 2 MG/ML ORAL SUSPENSION
10.0000 mg | Freq: Every day | ORAL | Status: DC
  Administered 2011-11-26 – 2011-11-29 (×4): 10 mg via ORAL
  Filled 2011-11-25 (×5): qty 5

## 2011-11-25 MED ORDER — MORPHINE SULFATE 2 MG/ML IJ SOLN: 2.0000 mg | INTRAMUSCULAR | Status: DC | PRN

## 2011-11-25 MED ORDER — SODIUM CHLORIDE 0.9 % IV BOLUS (SEPSIS): 500.0000 mL | Freq: Once | INTRAVENOUS | Status: DC

## 2011-11-25 MED ORDER — HYDROCODONE-ACETAMINOPHEN 5-325 MG PO TABS
2.0000 | ORAL_TABLET | ORAL | Status: DC | PRN
  Administered 2011-11-28: 2 via ORAL
  Filled 2011-11-25 (×2): qty 2

## 2011-11-25 MED ORDER — CEFAZOLIN SODIUM 1-5 GM-% IV SOLN
1.0000 g | Freq: Once | INTRAVENOUS | Status: AC
  Administered 2011-11-25: 1 g via INTRAVENOUS

## 2011-11-25 MED ORDER — BACITRACIN ZINC 500 UNIT/GM EX OINT
TOPICAL_OINTMENT | CUTANEOUS | Status: DC | PRN
  Filled 2011-11-25: qty 15

## 2011-11-25 MED ORDER — DOCUSATE SODIUM 100 MG PO CAPS
100.0000 mg | ORAL_CAPSULE | Freq: Two times a day (BID) | ORAL | Status: DC
  Administered 2011-11-25 – 2011-11-29 (×8): 100 mg via ORAL
  Filled 2011-11-25 (×8): qty 1

## 2011-11-25 MED ORDER — ATENOLOL 50 MG PO TABS
50.0000 mg | ORAL_TABLET | Freq: Every day | ORAL | Status: DC
  Administered 2011-11-26: 50 mg via ORAL
  Administered 2011-11-27: 25 mg via ORAL
  Administered 2011-11-28 – 2011-11-29 (×2): 50 mg via ORAL
  Filled 2011-11-25 (×5): qty 1

## 2011-11-25 MED ORDER — POTASSIUM CHLORIDE IN NACL 20-0.45 MEQ/L-% IV SOLN
INTRAVENOUS | Status: DC
  Administered 2011-11-25: 12:00:00 via INTRAVENOUS
  Administered 2011-11-26: 20 mL/h via INTRAVENOUS
  Filled 2011-11-25 (×2): qty 1000

## 2011-11-25 MED ORDER — HYDROCODONE-ACETAMINOPHEN 5-325 MG PO TABS: 0.5000 | ORAL_TABLET | ORAL | Status: DC | PRN

## 2011-11-25 NOTE — Progress Notes (Signed)
  Echocardiogram 2D Echocardiogram has been performed.  [name] 11/25/2011, 2:30 PM

## 2011-11-25 NOTE — Procedures (Signed)
Procedure: Revision and repair scalp laceration 13cm  Preop diagnosis: Scalp laceration  Postop diagnosis: Same  Surgeon: Charma Igo PA-C  Assist: None  EBL: None  Complications: None  Findings: Consent was obtained verbally from the patient requested her daughter signed a consent form. This was done. The laceration was then anesthetized with approximately 19 mL of 2% lidocaine with epinephrine. It was scrubbed with Betadine swab sticks. The wound was then cleaned with 4 x 4 soaked in sterile saline. A skin stapler was used to approximate the wound edges. The temporary sutures were removed. Patient tolerated procedure well.  Freeman Caldron, PA-C Pager: [phone] General Trauma PA Pager: [phone]

## 2011-11-25 NOTE — ED Notes (Signed)
Pt noted to have a large laceration that extends from the top of her scalp down to the back.  Bleeding is slow but controlled at this time.  Laceration is 2 inches wide.

## 2011-11-25 NOTE — ED Notes (Signed)
Pt being stitched up by Dr. Shela Commons.  Pt tolerating well.  No distress noted.

## 2011-11-25 NOTE — ED Notes (Signed)
Tinnie Gens PA with CCM will stitch up laceration to scalp

## 2011-11-25 NOTE — ED Notes (Signed)
Family at bedside. 

## 2011-11-25 NOTE — ED Notes (Signed)
Dr Elease Hashimoto at bedside, pt is ready to transport once MD exam is completed

## 2011-11-25 NOTE — ED Notes (Signed)
Pt in CT scan.  Taken by [employer]

## 2011-11-25 NOTE — Progress Notes (Signed)
CRITICAL VALUE ALERT  Critical value received:  ckmb 10.3  Date of notification:  11/25/11  Time of notification:  1500  Critical value read back:yes  Nurse who received alert:  Morrie Sheldon  MD notified (1st page):  wyatt  Time of first page:  1502  MD notified (2nd page):  Time of second page:  Responding MD:  wyatt  Time MD responded:  [phone]

## 2011-11-25 NOTE — ED Notes (Signed)
Dr wyatt at bedside speaking with pt and family.
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
## 2011-11-25 NOTE — ED Notes (Signed)
Pt complaining of discomfort to head where staples are, will give meds as ordered for trauma. Face cleaned of dry blood.

## 2011-11-25 NOTE — Procedures (Signed)
Laceration is no longer bleeding.  This patient has been seen and I agree with the findings and treatment plan.  Marta Lamas. Gae Bon, MD, FACS [phone] (pager) [phone] (direct pager) Trauma Surgeon
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
## 2011-11-25 NOTE — ED Notes (Signed)
PA jeffery made aware of BP. Order received for 500cc bolus. Pt denies any complaints. Pt alert and oriented. Family remains at bedside.

## 2011-11-25 NOTE — ED Notes (Signed)
Pt and family updated on poc.

## 2011-11-25 NOTE — Progress Notes (Signed)
PT Cancellation Note  Treatment cancelled today due to medical issues with patient which prohibited therapy.  Patient just got up to the floor.  Nursing asked PT to come back later today if able.  Will try to re-attempt this pm.  Thanks.  [facility] [phone] (pager)
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
## 2011-11-26 ENCOUNTER — Inpatient Hospital Stay (HOSPITAL_COMMUNITY): Payer: No Typology Code available for payment source

## 2011-11-26 DIAGNOSIS — I2510 Atherosclerotic heart disease of native coronary artery without angina pectoris: Secondary | ICD-10-CM

## 2011-11-26 LAB — CARDIAC PANEL(CRET KIN+CKTOT+MB+TROPI)
Relative Index: 1.8 (ref 0.0–2.5)
Total CK: 389 U/L — ABNORMAL HIGH (ref 7–177)
Troponin I: <0.3 ng/mL (ref <0.30)

## 2011-11-26 LAB — CBC
MCH: 31.5 pg (ref 26.0–34.0)
Platelets: 151 10*3/uL (ref 150–400)
RBC: 3.3 MIL/uL — ABNORMAL LOW (ref 3.87–5.11)
RDW: 13.4 % (ref 11.5–15.5)

## 2011-11-26 MED ORDER — NITROGLYCERIN 0.4 MG SL SUBL
SUBLINGUAL_TABLET | SUBLINGUAL | Status: AC
  Administered 2011-11-26: 0.4 mg via SUBLINGUAL
  Filled 2011-11-26: qty 25

## 2011-11-26 MED ORDER — NITROGLYCERIN 0.4 MG SL SUBL
0.4000 mg | SUBLINGUAL_TABLET | SUBLINGUAL | Status: DC | PRN
  Administered 2011-11-26 (×2): 0.4 mg via SUBLINGUAL
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
## 2011-11-26 NOTE — Progress Notes (Signed)
Pt c/o CP that was unrelieved by prn pain medication. Pt's vitals are stable with no change from baseline- HR SR 70's, BP 139/71, O2 sats 95% on 2L [place], with a RR of 17. Pt denies radiation of pain to arm or jaw. No EKG changes. MD Lindie Spruce paged to make aware. New orders received for nitro SL.   Will continue to monitor.   Felipa Emory
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
## 2011-11-27 NOTE — Progress Notes (Signed)
Trauma Service Note  Subjective: The patient is in bed with no complaints while at rest, says it only hurts when she tries to move.  Objective: Vital signs in last 24 hours: Temp:  [97.9 F (36.6 C)-98.9 F (37.2 C)] 98.6 F (37 C) (01/04 0725) Pulse Rate:  [57-77] 64  (01/04 0700) Resp:  [10-16] 12  (01/04 0700) BP: (94-163)/(43-82) 142/59 mmHg (01/04 0700) SpO2:  [89 %-100 %] 100 % (01/04 0700) Weight:  [150 lb 5.7 oz (68.2 kg)] 150 lb 5.7 oz (68.2 kg) (01/04 0500)    Intake/Output from previous day: 01/03 0701 - 01/04 0700 In: 960 [P.O.:500; I.V.:460] Out: 2200 [Urine:2200] Intake/Output this shift:    General: No acute distress  Lungs: Diminished a bit on the right, saturations are good.  Abd: Soft, good bowel sounds, appetite not back up to normal yet  Extremities: No DVT findings  Neuro: Intact  Lab Results: CBC   Basename 11/26/11 11/25/11 1651  WBC 8.7 10.0  HGB 10.4* 11.1*  HCT 30.9* 34.1*  PLT 151 148*   BMET  Basename 11/25/11 0638 11/25/11 0625  NA 139 136  K 3.8 3.7  CL 99 99  CO2 -- 30  GLUCOSE 148* 148*  BUN 9 10  CREATININE 0.80 0.77  CALCIUM -- 8.8   PT/INR  Basename 11/25/11 0625  LABPROT 12.4  INR 0.91   ABG No results found for this basename: PHART:2,PCO2:2,PO2:2,HCO3:2 in the last 72 hours  Studies/Results: Port Cxray  11/26/2011  *RADIOLOGY REPORT*  Clinical Data: Rib fractures, follow-up  PORTABLE CHEST - 1 VIEW  Comparison: Portable chest x-ray of 11/25/2011  Findings: There has been a slight increase in opacity at the left lung base most consistent with atelectasis and possible small left effusion. Left posterior rib fractures are noted.  The right lung is clear.  The heart is mildly enlarged and stable.  IMPRESSION: Slight increase in opacity at the left lung base.  Probable atelectasis and question tiny effusion.  Left rib fractures.  Original Report Authenticated By: Juline Patch, M.D.     Anti-infectives: Anti-infectives     Start     Dose/Rate Route Frequency Ordered Stop   11/25/11 0915   ceFAZolin (ANCEF) IVPB 1 g/50 mL premix        1 g 100 mL/hr over 30 Minutes Intravenous  Once 11/25/11 4098 11/25/11 0950          Assessment/Plan: s/p  Saline lock IV Transfer to floor Check labs in AM May be able to go home tomorrow.  LOS: 2 days   Marta Lamas. Gae Bon, MD, FACS [phone] Trauma Surgeon 11/27/2011
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
## 2011-11-27 NOTE — Progress Notes (Signed)
Clinical Social Worker following patient for discharge planning needs.  Per PT note, recommendation is for patient to return home with HHPT.  Patient confirmed that she will have family support in and out of the house at discharge.      Clinical Social Worker will sign off for now as social work intervention is no longer needed. Please consult [place] again if new need arises.  [address] [place], [place] [phone]

## 2011-11-27 NOTE — Progress Notes (Signed)
Report called to recving RN, tx w/ NT w/ Pt tolerating well. Rcvd by Nt.

## 2011-11-28 LAB — CBC
MCH: 31.3 pg (ref 26.0–34.0)
MCHC: 33.5 g/dL (ref 30.0–36.0)
MCV: 93.5 fL (ref 78.0–100.0)
Platelets: 121 10*3/uL — ABNORMAL LOW (ref 150–400)
RDW: 13.3 % (ref 11.5–15.5)

## 2011-11-28 LAB — DIFFERENTIAL
Basophils Absolute: 0 10*3/uL (ref 0.0–0.1)
Basophils Relative: 0 % (ref 0–1)
Eosinophils Absolute: 0 10*3/uL (ref 0.0–0.7)
Eosinophils Relative: 0 % (ref 0–5)
Neutrophils Relative %: 68 % (ref 43–77)

## 2011-11-28 MED ORDER — POLYETHYLENE GLYCOL 3350 17 G PO PACK
17.0000 g | PACK | Freq: Every day | ORAL | Status: DC
  Administered 2011-11-28 – 2011-11-29 (×2): 17 g via ORAL
  Filled 2011-11-28 (×2): qty 1

## 2011-11-28 NOTE — Progress Notes (Signed)
Physical Therapy Treatment Patient Details Name: [name] MRN: [id number] DOB: [birth year] Today's Date: 11/28/2011  PT Assessment/Plan  PT - Assessment/Plan Comments on Treatment Session: Pt did very well today. Ambulated on RA per MD request to wean. With fatigue her sats dropped to lower 80s but with cueing she recovered to >90% within 10 seconds by deep breathing. Pt left on RA to eat breakfast, RN aware. Pt likely to go home tomorrow. Needs to practice 1 step prior to d/c home.  PT Plan: Discharge plan remains appropriate PT Frequency: Min 5X/week Follow Up Recommendations: Home health PT Equipment Recommended: No equipment recommended. (she has RW at home which she reports she will use) PT Goals  Acute Rehab PT Goals PT Goal Formulation: With patient PT Goal: Sit to Stand - Progress: Progressing toward goal PT Goal: Stand to Sit - Progress: Progressing toward goal PT Transfer Goal: Bed to Chair/Chair to Bed - Progress: Progressing toward goal PT Goal: Ambulate - Progress: Progressing toward goal PT Goal: Up/Down Stairs - Progress: Not met PT Goal: Perform Home Exercise Program - Progress: Not met Additional Goals PT Goal: Additional Goal #1 - Progress: Discontinued (comment) (no need for this goal at this time)  PT Treatment Precautions/Restrictions  Precautions Precautions: Fall Restrictions Weight Bearing Restrictions: No Mobility (including Balance) Bed Mobility Bed Mobility: Yes Rolling Right: 5: Supervision Rolling Right Details (indicate cue type and reason): cueing for sequencing Right Sidelying to Sit: 4: Min assist Right Sidelying to Sit Details (indicate cue type and reason): min tactile cues for sequencing roll; pt initially attempting to sit straight up from supine position so educated on log roll for decreased pain and increased ease of movement Transfers Sit to Stand: 5: Supervision;From bed;From toilet Sit to Stand Details (indicate cue type and  reason): verbal cues for hand placement and set up for RW Stand to Sit: 5: Supervision;To chair/3-in-1;To toilet Stand to Sit Details: cueing for safe hand placement Ambulation/Gait Ambulation/Gait Assistance: 5: Supervision Ambulation/Gait Assistance Details (indicate cue type and reason): pt amb with RW 175 ft x2 with one 3 min rest break in between; amb with flexed posture relying more heavily on RW during gait secondary to rib pain; cueing for deeper breathing as she desated to lower 80s when fatigued needing rest break; cueing to relax shoulders and for more erect posture; slow gaurded gait with decreased strde length bilaterally  Ambulation Distance (Feet): 350 Feet Assistive device: Rolling walker  Posture/Postural Control Posture/Postural Control: No significant limitations Balance Balance Assessed: No Exercise    End of Session PT - End of Session Equipment Utilized During Treatment: Gait belt Activity Tolerance: Patient tolerated treatment well;Patient limited by pain;Patient limited by fatigue Patient left: in chair Nurse Communication: Mobility status for transfers;Mobility status for ambulation General Behavior During Session: [facility] for tasks performed Cognition: [facility] for tasks performed  [facility] HELEN 11/28/2011, 9:13 AM
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
## 2011-11-29 LAB — CBC
HCT: 27.8 % — ABNORMAL LOW (ref 36.0–46.0)
Hemoglobin: 9.4 g/dL — ABNORMAL LOW (ref 12.0–15.0)
MCH: 31.5 pg (ref 26.0–34.0)
MCHC: 33.8 g/dL (ref 30.0–36.0)
MCV: 93.3 fL (ref 78.0–100.0)
Platelets: 151 K/uL (ref 150–400)
RBC: 2.98 MIL/uL — ABNORMAL LOW (ref 3.87–5.11)
RDW: 13.4 % (ref 11.5–15.5)
WBC: 6.5 K/uL (ref 4.0–10.5)

## 2011-11-29 MED ORDER — HYDROCODONE-ACETAMINOPHEN 5-325 MG PO TABS: 1.0000 | ORAL_TABLET | ORAL | Status: AC | PRN

## 2011-11-29 MED ORDER — BACITRACIN ZINC 500 UNIT/GM EX OINT: TOPICAL_OINTMENT | Freq: Two times a day (BID) | CUTANEOUS | Status: AC
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
## 2011-11-29 NOTE — Progress Notes (Signed)
Physical Therapy Treatment Patient Details Name: [name] MRN: [id number] DOB: 05-May-1928 Today's Date: 11/29/2011  PT Assessment/Plan  PT - Assessment/Plan Comments on Treatment Session: Progressed with mobility with stair education completed today. No dyspnea today with minimal cues needed for pursed lip breathing to assist with breathing during mobility. PT Plan: Discharge plan remains appropriate PT Frequency: Min 5X/week Follow Up Recommendations: Home health PT Equipment Recommended: None recommended by PT PT Goals  Acute Rehab PT Goals PT Goal: Sit to Stand - Progress: Progressing toward goal PT Goal: Stand to Sit - Progress: Progressing toward goal PT Transfer Goal: Bed to Chair/Chair to Bed - Progress: Progressing toward goal PT Goal: Ambulate - Progress: Progressing toward goal PT Goal: Up/Down Stairs - Progress: Progressing toward goal PT Goal: Perform Home Exercise Program - Progress: Other (comment) (not addressed this session)  PT Treatment Precautions/Restrictions  Precautions Precautions: Fall Required Braces or Orthoses: No Restrictions Weight Bearing Restrictions: No Mobility (including Balance) Bed Mobility Rolling Right: 5: Supervision Rolling Right Details (indicate cue type and reason): HOB flat and no rails used. cues to reach across body with left arm. Right Sidelying to Sit: 5: Supervision;HOB flat Right Sidelying to Sit Details (indicate cue type and reason): no rails. cues to use right elbow and left hand to push trunk into sitting position. Transfers Sit to Stand: 5: Supervision;From bed;With upper extremity assist Sit to Stand Details (indicate cue type and reason): cues for safe hand placement (pt attempted to pull up on RW, cues to place hands on the surface from which standing as it is more sturdy) Stand to Sit: 5: Supervision;To chair/3-in-1;Without upper extremity assist Stand to Sit Details: good technique with controled  descent Ambulation/Gait Ambulation/Gait: Yes Ambulation/Gait Assistance: 5: Supervision Ambulation/Gait Assistance Details (indicate cue type and reason): cues for pursed lip breathing and upright posture with gait Ambulation Distance (Feet): 200 Feet Assistive device: Rolling walker Gait Pattern: Decreased stride length;Trunk flexed;Step-through pattern Stairs: Yes Stairs Assistance: 5: Supervision Stairs Assistance Details (indicate cue type and reason): cues for sequency with going up/down 1 step x2 with RW forwards Stair Management Technique: Forwards;With walker Number of Stairs: 1  (1step x2 trials)  Posture/Postural Control Posture/Postural Control: No significant limitations Exercise    End of Session PT - End of Session Equipment Utilized During Treatment: Gait belt Activity Tolerance: Patient tolerated treatment well Patient left: in chair;with call bell in reach Nurse Communication: Mobility status for transfers General Behavior During Session: [facility] for tasks performed Cognition: [facility] for tasks performed  Sallyanne Kuster 11/29/2011, 10:02 AM  Sallyanne Kuster, PTA Office- [phone]
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
## 2011-11-30 ENCOUNTER — Telehealth: Payer: Self-pay | Admitting: Orthopedic Surgery

## 2011-11-30 NOTE — Telephone Encounter (Signed)
Gave appointment for 1/10 for staple removal.

## 2011-12-03 ENCOUNTER — Encounter (INDEPENDENT_AMBULATORY_CARE_PROVIDER_SITE_OTHER): Payer: Self-pay

## 2011-12-03 ENCOUNTER — Ambulatory Visit (INDEPENDENT_AMBULATORY_CARE_PROVIDER_SITE_OTHER): Payer: Medicare Other | Admitting: Orthopedic Surgery

## 2011-12-03 DIAGNOSIS — S0100XA Unspecified open wound of scalp, initial encounter: Secondary | ICD-10-CM

## 2011-12-03 DIAGNOSIS — S0101XA Laceration without foreign body of scalp, initial encounter: Secondary | ICD-10-CM
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
## 2011-12-10 ENCOUNTER — Encounter (INDEPENDENT_AMBULATORY_CARE_PROVIDER_SITE_OTHER): Payer: Self-pay

## 2011-12-10 ENCOUNTER — Ambulatory Visit (INDEPENDENT_AMBULATORY_CARE_PROVIDER_SITE_OTHER): Payer: Medicare Other | Admitting: Physician Assistant

## 2011-12-10 DIAGNOSIS — S36039A Unspecified laceration of spleen, initial encounter: Secondary | ICD-10-CM

## 2011-12-10 DIAGNOSIS — S060X9A Concussion with loss of consciousness of unspecified duration, initial encounter: Secondary | ICD-10-CM

## 2011-12-10 DIAGNOSIS — S0101XA Laceration without foreign body of scalp, initial encounter: Secondary | ICD-10-CM

## 2011-12-10 DIAGNOSIS — S2249XA Multiple fractures of ribs, unspecified side, initial encounter for closed fracture: Secondary | ICD-10-CM

## 2011-12-10 DIAGNOSIS — S3609XA Other injury of spleen, initial encounter: Secondary | ICD-10-CM

## 2011-12-10 DIAGNOSIS — S060XAA Concussion with loss of consciousness status unknown, initial encounter: Secondary | ICD-10-CM

## 2011-12-10 DIAGNOSIS — S0100XA Unspecified open wound of scalp, initial encounter: Secondary | ICD-10-CM

## 2011-12-10 DIAGNOSIS — S2242XA Multiple fractures of ribs, left side, initial encounter for closed fracture: Secondary | ICD-10-CM

## 2011-12-10 NOTE — Patient Instructions (Signed)
You may start some driving, but avoid the pain medication prior to driving. Call in 2 weeks to let us know how things are going.  We may need to start some Physical Therapy if your neck remains painful.
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
## 2011-12-18 ENCOUNTER — Encounter: Payer: Medicare Other | Admitting: Gynecology

## 2011-12-22 ENCOUNTER — Ambulatory Visit (INDEPENDENT_AMBULATORY_CARE_PROVIDER_SITE_OTHER): Payer: Medicare Other | Admitting: Gynecology

## 2011-12-22 ENCOUNTER — Encounter: Payer: Self-pay | Admitting: Gynecology

## 2011-12-22 VITALS — BP 140/88 | Ht 58.5 in | Wt 146.0 lb

## 2011-12-22 DIAGNOSIS — I10 Essential (primary) hypertension: Secondary | ICD-10-CM

## 2011-12-22 DIAGNOSIS — Z780 Asymptomatic menopausal state: Secondary | ICD-10-CM

## 2011-12-22 DIAGNOSIS — M949 Disorder of cartilage, unspecified: Secondary | ICD-10-CM

## 2011-12-22 DIAGNOSIS — N952 Postmenopausal atrophic vaginitis: Secondary | ICD-10-CM

## 2011-12-22 DIAGNOSIS — N390 Urinary tract infection, site not specified: Secondary | ICD-10-CM

## 2011-12-22 DIAGNOSIS — M8580 Other specified disorders of bone density and structure, unspecified site: Secondary | ICD-10-CM

## 2011-12-22 DIAGNOSIS — R635 Abnormal weight gain: Secondary | ICD-10-CM

## 2011-12-22 DIAGNOSIS — E559 Vitamin D deficiency, unspecified: Secondary | ICD-10-CM | POA: Insufficient documentation

## 2011-12-22 DIAGNOSIS — Z1211 Encounter for screening for malignant neoplasm of colon: Secondary | ICD-10-CM

## 2011-12-22 LAB — URINALYSIS W MICROSCOPIC + REFLEX CULTURE
Casts: NONE SEEN
Glucose, UA: NEGATIVE mg/dL
Ketones, ur: NEGATIVE mg/dL
pH: 6 (ref 5.0–8.0)

## 2011-12-22 MED ORDER — NONFORMULARY OR COMPOUNDED ITEM: Status: DC

## 2011-12-22 NOTE — Patient Instructions (Signed)
Cholesterol Control Diet  Cholesterol levels in your body are determined significantly by your diet. Cholesterol levels may also be related to heart disease. The following material helps to explain this relationship and discusses what you can do to help keep your heart healthy. Not all cholesterol is bad. Low-density lipoprotein (LDL) cholesterol is the "bad" cholesterol. It may cause fatty deposits to build up inside your arteries. High-density lipoprotein (HDL) cholesterol is "good." It helps to remove the "bad" LDL cholesterol from your blood. Cholesterol is a very important risk factor for heart disease. Other risk factors are high blood pressure, smoking, stress, heredity, and weight. The heart muscle gets its supply of blood through the coronary arteries. If your LDL cholesterol is high and your HDL cholesterol is low, you are at risk for having fatty deposits build up in your coronary arteries. This leaves less room through which blood can flow. Without sufficient blood and oxygen, the heart muscle cannot function properly and you may feel chest pains (angina pectoris). When a coronary artery closes up entirely, a part of the heart muscle may die, causing a heart attack (myocardial infarction). CHECKING CHOLESTEROL When your caregiver sends your blood to a lab to be analyzed for cholesterol, a complete lipid (fat) profile may be done. With this test, the total amount of cholesterol and levels of LDL and HDL are determined. Triglycerides are a type of fat that circulates in the blood and can also be used to determine heart disease risk. The list below describes what the numbers should be: Test: Total Cholesterol.  Less than 200 mg/dl.  Test: LDL "bad cholesterol."  Less than 100 mg/dl.   Less than 70 mg/dl if you are at very high risk of a heart attack or sudden cardiac death.  Test: HDL "good cholesterol."  Greater than 50 mg/dl for women.    Greater than 40 mg/dl for men.  Test: Triglycerides.  Less than 150 mg/dl.  CONTROLLING CHOLESTEROL WITH DIET Although exercise and lifestyle factors are important, your diet is key. That is because certain foods are known to raise cholesterol and others to lower it. The goal is to balance foods for their effect on cholesterol and more importantly, to replace saturated and trans fat with other types of fat, such as monounsaturated fat, polyunsaturated fat, and omega-3 fatty acids. On average, a person should consume no more than 15 to 17 g of saturated fat daily. Saturated and trans fats are considered "bad" fats, and they will raise LDL cholesterol. Saturated fats are primarily found in animal products such as meats, butter, and cream. However, that does not mean you need to sacrifice all your favorite foods. Today, there are good tasting, low-fat, low-cholesterol substitutes for most of the things you like to eat. Choose low-fat or nonfat alternatives. Choose round or loin cuts of red meat, since these types of cuts are lowest in fat and cholesterol. Chicken (without the skin), fish, veal, and ground turkey breast are excellent choices. Eliminate fatty meats, such as hot dogs and salami. Even shellfish have little or no saturated fat. Have a 3 oz (85 g) portion when you eat lean meat, poultry, or fish. Trans fats are also called "partially hydrogenated oils." They are oils that have been scientifically manipulated so that they are solid at room temperature resulting in a longer shelf life and improved taste and texture of foods in which they are added. Trans fats are found in stick margarine, some tub margarines, cookies, crackers, and baked goods.    When baking and cooking, oils are an excellent substitute for butter. The monounsaturated oils are especially beneficial since it is believed they lower LDL and raise HDL. The oils you should avoid entirely are saturated tropical oils, such as coconut and  palm.  Remember to eat liberally from food groups that are naturally free of saturated and trans fat, including fish, fruit, vegetables, beans, grains (barley, rice, couscous, bulgur wheat), and pasta (without cream sauces).  IDENTIFYING FOODS THAT LOWER CHOLESTEROL  Soluble fiber may lower your cholesterol. This type of fiber is found in fruits such as apples, vegetables such as broccoli, potatoes, and carrots, legumes such as beans, peas, and lentils, and grains such as barley. Foods fortified with plant sterols (phytosterol) may also lower cholesterol. You should eat at least 2 g per day of these foods for a cholesterol lowering effect.  Read package labels to identify low-saturated fats, trans fats free, and low-fat foods at the supermarket. Select cheeses that have only 2 to 3 g saturated fat per ounce. Use a heart-healthy tub margarine that is free of trans fats or partially hydrogenated oil. When buying baked goods (cookies, crackers), avoid partially hydrogenated oils. Breads and muffins should be made from whole grains (whole-wheat or whole oat flour, instead of "flour" or "enriched flour"). Buy non-creamy canned soups with reduced salt and no added fats.  FOOD PREPARATION TECHNIQUES  Never deep-fry. If you must fry, either stir-fry, which uses very little fat, or use non-stick cooking sprays. When possible, broil, bake, or roast meats, and steam vegetables. Instead of dressing vegetables with butter or margarine, use lemon and herbs, applesauce and cinnamon (for squash and sweet potatoes), nonfat yogurt, salsa, and low-fat dressings for salads.  LOW-SATURATED FAT / LOW-FAT FOOD SUBSTITUTES Meats / Saturated Fat (g)  Avoid: Steak, marbled (3 oz/85 g) / 11 g   Choose: Steak, lean (3 oz/85 g) / 4 g   Avoid: Hamburger (3 oz/85 g) / 7 g   Choose: Hamburger, lean (3 oz/85 g) / 5 g   Avoid: Ham (3 oz/85 g) / 6 g   Choose: Ham, lean cut (3 oz/85 g) / 2.4 g   Avoid: Chicken, with skin, dark  meat (3 oz/85 g) / 4 g   Choose: Chicken, skin removed, dark meat (3 oz/85 g) / 2 g   Avoid: Chicken, with skin, light meat (3 oz/85 g) / 2.5 g   Choose: Chicken, skin removed, light meat (3 oz/85 g) / 1 g  Dairy / Saturated Fat (g)  Avoid: Whole milk (1 cup) / 5 g   Choose: Low-fat milk, 2% (1 cup) / 3 g   Choose: Low-fat milk, 1% (1 cup) / 1.5 g   Choose: Skim milk (1 cup) / 0.3 g   Avoid: Hard cheese (1 oz/28 g) / 6 g   Choose: Skim milk cheese (1 oz/28 g) / 2 to 3 g   Avoid: Cottage cheese, 4% fat (1 cup) / 6.5 g   Choose: Low-fat cottage cheese, 1% fat (1 cup) / 1.5 g   Avoid: Ice cream (1 cup) / 9 g   Choose: Sherbet (1 cup) / 2.5 g   Choose: Nonfat frozen yogurt (1 cup) / 0.3 g   Choose: Frozen fruit bar / trace   Avoid: Whipped cream (1 tbs) / 3.5 g   Choose: Nondairy whipped topping (1 tbs) / 1 g  Condiments / Saturated Fat (g)  Avoid: Mayonnaise (1 tbs) / 2 g   Choose: Low-fat   mayonnaise (1 tbs) / 1 g   Avoid: Butter (1 tbs) / 7 g   Choose: Extra light margarine (1 tbs) / 1 g   Avoid: Coconut oil (1 tbs) / 11.8 g   Choose: Olive oil (1 tbs) / 1.8 g   Choose: Corn oil (1 tbs) / 1.7 g   Choose: Safflower oil (1 tbs) / 1.2 g   Choose: Sunflower oil (1 tbs) / 1.4 g   Choose: Soybean oil (1 tbs) / 2.4 g   Choose: Canola oil (1 tbs) / 1 g  Document Released: 11/09/2005 Document Revised: 07/22/2011 Document Reviewed: 04/30/2011 Exit[employer] Patient Information 2012 [employer].  Exercise to Lose Weight Exercise and a healthy diet may help you lose weight. Your doctor may suggest specific exercises. EXERCISE IDEAS AND TIPS  Choose low-cost things you enjoy doing, such as walking, bicycling, or exercising to workout videos.   Take stairs instead of the elevator.   Walk during your lunch break.   Park your car further away from work or school.   Go to a gym or an exercise class.   Start with 5 to 10 minutes of exercise each day. Build up to  30 minutes of exercise 4 to 6 days a week.   Wear shoes with good support and comfortable clothes.   Stretch before and after working out.   Work out until you breathe harder and your heart beats faster.   Drink extra water when you exercise.   Do not do so much that you hurt yourself, feel dizzy, or get very short of breath.  Exercises that burn about 150 calories:  Running 1  miles in 15 minutes.   Playing volleyball for 45 to 60 minutes.   Washing and waxing a car for 45 to 60 minutes.   Playing touch football for 45 minutes.   Walking 1  miles in 35 minutes.   Pushing a stroller 1  miles in 30 minutes.   Playing basketball for 30 minutes.   Raking leaves for 30 minutes.   Bicycling 5 miles in 30 minutes.   Walking 2 miles in 30 minutes.   Dancing for 30 minutes.   Shoveling snow for 15 minutes.   Swimming laps for 20 minutes.   Walking up stairs for 15 minutes.   Bicycling 4 miles in 15 minutes.   Gardening for 30 to 45 minutes.   Jumping rope for 15 minutes.   Washing windows or floors for 45 to 60 minutes.  Document Released: 12/12/2010 Document Revised: 07/22/2011 Document Reviewed: 12/12/2010 [employer] Patient Information 2012 [employer].  

## 2011-12-22 NOTE — Progress Notes (Signed)
[name] 10/08/1928 [id number]   History:    78 y.o.  for the GYN exam. Review of her record indicates that she has not seen an internist her family practice doctor will several years. She is seeing Dr. Lourena Simmonds cardiologist. Patient has a history of a cardiac stent placed in 2001 and he has been prescribing her medication for her hypertension such as atenolol and Diovan. Her blood pressure today was 140/88. Her last bone density study was in March of 2012. Her lowest T score was at the right femoral neck -1.3 no Frax analysis due to the fact the patient is on drug-free holiday from Fosamax (2001-2007) she is taking her calcium and vitamin D. Last colonoscopy in 2009 reported to be normal. 2010 patient had a biopsy from the fourchette any was benign lentigo.   Past medical history,surgical history, family history and social history were all reviewed and documented in the EPIC chart.  Gynecologic History No LMP recorded. Patient is postmenopausal. Contraception: none Last Pa 2012Results were: normal Last mammogram: 2013. Results were: Results pending  Obstetric History OB History    Grav Para Term Preterm Abortions TAB SAB Ect Mult Living   3 3 3       3      # Outc Date GA Lbr Len/2nd Wgt Sex Del Anes PTL Lv   1 TRM     F SVD  No Yes   2 TRM     F SVD  No Yes   3 TRM     F SVD  No Yes       ROS:  Was performed and pertinent positives and negatives are included in the history.  Exam: chaperone present  BP 140/88  Ht 4' 10.5" (1.486 m)  Wt 146 lb (66.225 kg)  BMI 29.99 kg/m2  Body mass index is 29.99 kg/(m^2).  General appearance : Well developed well nourished female. No acute distress HEENT: Neck supple, trachea midline, no carotid bruits, no thyroidmegaly Lungs: Clear to auscultation, no rhonchi or wheezes, or rib retractions  Heart: Regular rate and rhythm, no murmurs or gallops Breast:Examined in sitting and supine position were symmetrical in appearance, no palpable  masses or tenderness,  no skin retraction, no nipple inversion, no nipple discharge, no skin discoloration, no axillary or supraclavicular lymphadenopathy Abdomen: no palpable masses or tenderness, no rebound or guarding Extremities: no edema or skin discoloration or tenderness  Pelvic:  Bartholin, Urethra, Skene Glands: Within normal limits             Vagina: No gross lesions or discharge atrophy  Cervix: No gross lesions or discharge  Uterus  axial, normal size, shape and consistency, non-tender and mobile  Adnexa  Without masses or tenderness  Anus and perineum  normal   Rectovaginal  normal sphincter tone without palpated masses or tenderness             Hemoccult obtained results pending at time of this dictation     Assessment/Plan:  78 y.o. female with evidence of vaginal atrophy. She will be placed on the following regimen: Estradiol 0.02% (1 mL pre-filled applicator) to apply twice a week. This will be called in to the compounding pharmacy. She was encouraged to take her calcium and vitamin D twice a day for osteoporosis prevention. She will need her next bone density study next March. She is encouraged to do her monthly self breast examination. She is encouraged to engage in weightbearing exercises 45 minutes 3 times a week. No  Pap smear was done today she is now 78 years of age has had normal Pap smears in the past Lines discussed. Fecal occult blood testing result pending at time of this dictation. Names of internist and family practice doctors in the community was provided for her to make an appointment to get established since her past Dr. has moved.   Ok Edwards MD, 3:09 PM 12/22/2011

## 2011-12-23 ENCOUNTER — Encounter: Payer: Self-pay | Admitting: Gynecology

## 2011-12-23 LAB — CBC WITH DIFFERENTIAL/PLATELET
Eosinophils Absolute: 0.3 10*3/uL (ref 0.0–0.7)
Eosinophils Relative: 5 % (ref 0–5)
Lymphs Abs: 1.6 10*3/uL (ref 0.7–4.0)
MCH: 29.8 pg (ref 26.0–34.0)
MCV: 95.3 fL (ref 78.0–100.0)
Monocytes Relative: 9 % (ref 3–12)
Platelets: 270 10*3/uL (ref 150–400)
RBC: 3.59 MIL/uL — ABNORMAL LOW (ref 3.87–5.11)

## 2011-12-23 LAB — COMPREHENSIVE METABOLIC PANEL
ALT: <8 U/L (ref 0–35)
AST: 14 U/L (ref 0–37)
Calcium: 8.7 mg/dL (ref 8.4–10.5)
Chloride: 102 mEq/L (ref 96–112)
Creat: 0.72 mg/dL (ref 0.50–1.10)
Potassium: 3.7 mEq/L (ref 3.5–5.3)
Sodium: 140 mEq/L (ref 135–145)
Total Protein: 6.5 g/dL (ref 6.0–8.3)

## 2011-12-23 LAB — CHOLESTEROL, TOTAL: Cholesterol: 175 mg/dL (ref 0–200)

## 2011-12-24 ENCOUNTER — Telehealth (INDEPENDENT_AMBULATORY_CARE_PROVIDER_SITE_OTHER): Payer: Self-pay | Admitting: Physician Assistant

## 2011-12-24 DIAGNOSIS — S161XXA Strain of muscle, fascia and tendon at neck level, initial encounter: Secondary | ICD-10-CM

## 2011-12-24 NOTE — Progress Notes (Signed)
Addended by: Ladona Horns E on: 12/24/2011 04:41 PM   Modules accepted: Orders

## 2011-12-24 NOTE — Telephone Encounter (Signed)
[name] is still having some neck pain and would like a referral for PT- Referral sent to [facility] by fax and will do in Epic system as well. She would like to return to work, but I encouraged her to see how she did with therapy for the next week or two before returning to work.

## 2011-12-25 ENCOUNTER — Ambulatory Visit (INDEPENDENT_AMBULATORY_CARE_PROVIDER_SITE_OTHER): Payer: Medicare Other | Admitting: Cardiovascular Disease

## 2011-12-25 ENCOUNTER — Encounter: Payer: Self-pay | Admitting: Cardiovascular Disease

## 2011-12-25 VITALS — BP 130/80 | HR 82 | Ht <= 58 in | Wt 143.0 lb

## 2011-12-25 DIAGNOSIS — I2510 Atherosclerotic heart disease of native coronary artery without angina pectoris: Secondary | ICD-10-CM

## 2011-12-25 NOTE — Assessment & Plan Note (Signed)
[name] is doing very well. We'll continue with her same medications. I'll see her again in 6 months.

## 2011-12-25 NOTE — Patient Instructions (Signed)
Your physician wants you to follow-up in: 6 months  You will receive a reminder letter in the mail two months in advance. If you don't receive a letter, please call our office to schedule the follow-up appointment.  Your physician recommends that you return for a FASTING lipid profile: 6 months   
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
## 2011-12-28 ENCOUNTER — Telehealth (INDEPENDENT_AMBULATORY_CARE_PROVIDER_SITE_OTHER): Payer: Self-pay | Admitting: Physician Assistant

## 2011-12-28 NOTE — Telephone Encounter (Signed)
Spoke with [name] concerning [name]'s having increased SOB with activities.   Started to feel SOB with activities over past couple of weeks.   Doesn't feel that much discomfort from her ribs anymore, but just feeling more SOB over past 2 weeks. She also reports not sleeping and feeling tired all the time. Referred her back to her PCP at this point . She has already called to try and get in to see her PCP and is waiting to hear back.

## 2011-12-29 ENCOUNTER — Ambulatory Visit: Payer: Medicare Other | Attending: Surgery | Admitting: Physical Therapy

## 2011-12-29 ENCOUNTER — Other Ambulatory Visit (INDEPENDENT_AMBULATORY_CARE_PROVIDER_SITE_OTHER): Payer: Self-pay | Admitting: Surgery

## 2011-12-29 DIAGNOSIS — IMO0001 Reserved for inherently not codable concepts without codable children: Secondary | ICD-10-CM | POA: Insufficient documentation

## 2011-12-29 DIAGNOSIS — M2569 Stiffness of other specified joint, not elsewhere classified: Secondary | ICD-10-CM | POA: Insufficient documentation

## 2011-12-29 DIAGNOSIS — M542 Cervicalgia: Secondary | ICD-10-CM | POA: Insufficient documentation

## 2011-12-30 ENCOUNTER — Other Ambulatory Visit: Payer: Self-pay | Admitting: *Deleted

## 2011-12-30 DIAGNOSIS — R8290 Unspecified abnormal findings in urine: Secondary | ICD-10-CM

## 2011-12-31 ENCOUNTER — Emergency Department (HOSPITAL_COMMUNITY): Payer: Medicare Other

## 2011-12-31 ENCOUNTER — Other Ambulatory Visit: Payer: Self-pay

## 2011-12-31 ENCOUNTER — Other Ambulatory Visit: Payer: Self-pay | Admitting: Interventional Radiology

## 2011-12-31 ENCOUNTER — Encounter (HOSPITAL_COMMUNITY): Payer: Self-pay | Admitting: Nurse Practitioner

## 2011-12-31 ENCOUNTER — Emergency Department (HOSPITAL_COMMUNITY)
Admit: 2011-12-31 | Discharge: 2011-12-31 | Disposition: A | Discharge status: Home / Self Care | Payer: Medicare Other | Attending: Interventional Radiology | Admitting: Interventional Radiology

## 2011-12-31 ENCOUNTER — Emergency Department (HOSPITAL_COMMUNITY)
Admission: EM | Admit: 2011-12-31 | Discharge: 2011-12-31 | Disposition: A | Discharge status: Home / Self Care | Payer: Medicare Other | Attending: Emergency Medicine | Admitting: Emergency Medicine

## 2011-12-31 DIAGNOSIS — R0609 Other forms of dyspnea: Secondary | ICD-10-CM | POA: Insufficient documentation

## 2011-12-31 DIAGNOSIS — I10 Essential (primary) hypertension: Secondary | ICD-10-CM | POA: Insufficient documentation

## 2011-12-31 DIAGNOSIS — Z7982 Long term (current) use of aspirin: Secondary | ICD-10-CM | POA: Insufficient documentation

## 2011-12-31 DIAGNOSIS — I2510 Atherosclerotic heart disease of native coronary artery without angina pectoris: Secondary | ICD-10-CM | POA: Insufficient documentation

## 2011-12-31 DIAGNOSIS — J942 Hemothorax: Secondary | ICD-10-CM

## 2011-12-31 DIAGNOSIS — Z79899 Other long term (current) drug therapy: Secondary | ICD-10-CM | POA: Insufficient documentation

## 2011-12-31 DIAGNOSIS — J45909 Unspecified asthma, uncomplicated: Secondary | ICD-10-CM | POA: Insufficient documentation

## 2011-12-31 DIAGNOSIS — R5381 Other malaise: Secondary | ICD-10-CM | POA: Insufficient documentation

## 2011-12-31 DIAGNOSIS — I509 Heart failure, unspecified: Secondary | ICD-10-CM | POA: Insufficient documentation

## 2011-12-31 DIAGNOSIS — R0602 Shortness of breath: Secondary | ICD-10-CM | POA: Insufficient documentation

## 2011-12-31 DIAGNOSIS — R0989 Other specified symptoms and signs involving the circulatory and respiratory systems: Secondary | ICD-10-CM | POA: Insufficient documentation

## 2011-12-31 DIAGNOSIS — J90 Pleural effusion, not elsewhere classified: Secondary | ICD-10-CM | POA: Insufficient documentation

## 2011-12-31 LAB — COMPREHENSIVE METABOLIC PANEL
AST: 14 U/L (ref 0–37)
Albumin: 3.1 g/dL — ABNORMAL LOW (ref 3.5–5.2)
Alkaline Phosphatase: 80 U/L (ref 39–117)
BUN: 7 mg/dL (ref 6–23)
CO2: 30 mEq/L (ref 19–32)
Chloride: 94 mEq/L — ABNORMAL LOW (ref 96–112)
Creatinine, Ser: 0.52 mg/dL (ref 0.50–1.10)
GFR calc non Af Amer: 87 mL/min — ABNORMAL LOW (ref >90)
Potassium: 4.1 mEq/L (ref 3.5–5.1)
Total Bilirubin: 0.4 mg/dL (ref 0.3–1.2)

## 2011-12-31 LAB — CBC
HCT: 34.5 % — ABNORMAL LOW (ref 36.0–46.0)
MCV: 91 fL (ref 78.0–100.0)
RBC: 3.79 MIL/uL — ABNORMAL LOW (ref 3.87–5.11)
RDW: 13.4 % (ref 11.5–15.5)
WBC: 9.6 10*3/uL (ref 4.0–10.5)

## 2011-12-31 LAB — PRO B NATRIURETIC PEPTIDE: Pro B Natriuretic peptide (BNP): 730.1 pg/mL — ABNORMAL HIGH (ref 0–450)
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
## 2011-12-31 NOTE — ED Notes (Signed)
Patient transported to CT 

## 2011-12-31 NOTE — ED Notes (Signed)
Patient transported to Ultrasound 

## 2011-12-31 NOTE — Procedures (Signed)
Successful lt US thoracentesis No comp Stable 1100 cc bloody pleural fld removed
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
## 2011-12-31 NOTE — ED Notes (Signed)
Pt c/o sob x2 wks, increase sob this am, pt [facility] today d/t possible fluid on her Left lung, pt presents w/a disk from a CXR

## 2012-01-01 ENCOUNTER — Ambulatory Visit: Payer: Medicare Other | Admitting: Physical Therapy

## 2012-01-01 ENCOUNTER — Telehealth: Payer: Self-pay | Admitting: Orthopedic Surgery

## 2012-01-01 ENCOUNTER — Other Ambulatory Visit: Payer: Medicare Other

## 2012-01-01 MED ORDER — HYDROCODONE-ACETAMINOPHEN 5-325 MG PO TABS: 1.0000 | ORAL_TABLET | ORAL | Status: DC | PRN

## 2012-01-01 NOTE — Telephone Encounter (Signed)
Daughter called to schedule appointment and patient wanted a refill on her pain medicine as she was hurting more since the procedure. She does have a PCP now and I suggested she continue to follow-up with them for the effusion. Since it was late on Friday I agreed to call in a small amount of Norco to get her through the weekend until she could contact her PCP. Called in to [facility] [phone].

## 2012-01-12 ENCOUNTER — Ambulatory Visit: Payer: Medicare Other | Admitting: Physical Therapy

## 2012-01-14 ENCOUNTER — Ambulatory Visit: Payer: Medicare Other | Admitting: Physical Therapy

## 2012-01-19 ENCOUNTER — Ambulatory Visit: Payer: Medicare Other | Admitting: Physical Therapy

## 2012-01-21 ENCOUNTER — Other Ambulatory Visit: Payer: Self-pay | Admitting: *Deleted

## 2012-01-21 ENCOUNTER — Ambulatory Visit: Payer: Medicare Other | Admitting: Physical Therapy

## 2012-01-21 MED ORDER — ATENOLOL 50 MG PO TABS: 25.0000 mg | ORAL_TABLET | Freq: Every day | ORAL | Status: DC

## 2012-01-26 ENCOUNTER — Ambulatory Visit: Payer: Medicare Other | Attending: Surgery | Admitting: Physical Therapy

## 2012-01-26 DIAGNOSIS — IMO0001 Reserved for inherently not codable concepts without codable children: Secondary | ICD-10-CM | POA: Insufficient documentation

## 2012-01-26 DIAGNOSIS — M2569 Stiffness of other specified joint, not elsewhere classified: Secondary | ICD-10-CM | POA: Insufficient documentation

## 2012-01-26 DIAGNOSIS — M542 Cervicalgia: Secondary | ICD-10-CM | POA: Insufficient documentation

## 2012-01-28 ENCOUNTER — Telehealth: Payer: Self-pay | Admitting: Cardiovascular Disease

## 2012-01-28 ENCOUNTER — Ambulatory Visit: Payer: Medicare Other | Admitting: Physical Therapy

## 2012-01-28 NOTE — Telephone Encounter (Signed)
New problem:  Per Denny Peon, patient blood pressure has increase during the office visit 168/78. Can medication Diovan be increase patient taken 160 mg daily.

## 2012-01-28 NOTE — Telephone Encounter (Signed)
Dr Elease Hashimoto said its ok to increase diovan to 320 mg daily, PA Erin advised

## 2012-01-28 NOTE — Telephone Encounter (Signed)
msg left that dr Elease Hashimoto out this am and will contact this afternoon

## 2012-02-02 ENCOUNTER — Ambulatory Visit: Payer: Medicare Other | Admitting: Physical Therapy

## 2012-02-04 ENCOUNTER — Ambulatory Visit: Payer: Medicare Other | Admitting: Physical Therapy

## 2012-03-23 ENCOUNTER — Other Ambulatory Visit: Payer: Self-pay | Admitting: *Deleted

## 2012-03-23 MED ORDER — NITROGLYCERIN 0.4 MG SL SUBL: 0.4000 mg | SUBLINGUAL_TABLET | SUBLINGUAL | Status: DC | PRN

## 2012-07-08 ENCOUNTER — Encounter: Payer: Self-pay | Admitting: Cardiovascular Disease

## 2012-07-08 ENCOUNTER — Ambulatory Visit (INDEPENDENT_AMBULATORY_CARE_PROVIDER_SITE_OTHER): Payer: Medicare Other | Admitting: Cardiovascular Disease

## 2012-07-08 VITALS — BP 128/78 | HR 63 | Ht <= 58 in | Wt 147.8 lb

## 2012-07-08 DIAGNOSIS — I10 Essential (primary) hypertension: Secondary | ICD-10-CM | POA: Insufficient documentation

## 2012-07-08 DIAGNOSIS — I2510 Atherosclerotic heart disease of native coronary artery without angina pectoris: Secondary | ICD-10-CM

## 2012-07-08 NOTE — Patient Instructions (Addendum)
Your physician wants you to follow-up in: 6 months  You will receive a reminder letter in the mail two months in advance. If you don't receive a letter, please call our office to schedule the follow-up appointment.  PT DECLINES HAVING CHOLESTEROL LEVELS CHECKED, STATES SHE DOES NOT  LIKE THE MED'S TO REDUCE IT SO I  DON'T WANT TO DO THE LAB TEST.

## 2012-07-08 NOTE — Assessment & Plan Note (Signed)
[name] has a history of coronary artery disease. She has not had any episodes of angina recently. We'll continue with her current medications.

## 2012-07-08 NOTE — Assessment & Plan Note (Signed)
[name]  presents today for further evaluation of her high blood pressure. Her blood pressure was initially measured as 180/90.   I found her blood pressure to be quite a bit lower after she had stopped and rested for a little bit. I think it her blood pressure is actually fairly well controlled.  I cautioned her about eating extra salt.
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
## 2012-11-04 ENCOUNTER — Telehealth: Payer: Self-pay | Admitting: Cardiovascular Disease

## 2012-11-04 MED ORDER — ATENOLOL 25 MG PO TABS: 25.0000 mg | ORAL_TABLET | Freq: Every day | ORAL | Status: DC

## 2012-11-04 NOTE — Telephone Encounter (Signed)
Pt dosen't want to have cut her atenolol 50mg  in half anymore so she needs a Rx called into [facility] [place] on [address] For 25mg 

## 2012-11-04 NOTE — Telephone Encounter (Signed)
MAR updated, script sent, pt informed by msg.

## 2012-12-27 ENCOUNTER — Encounter: Payer: Self-pay | Admitting: Gynecology

## 2013-01-10 ENCOUNTER — Telehealth: Payer: Self-pay | Admitting: Cardiovascular Disease

## 2013-01-10 MED ORDER — VALSARTAN 160 MG PO TABS: 160.0000 mg | ORAL_TABLET | Freq: Every day | ORAL | Status: DC

## 2013-01-10 NOTE — Telephone Encounter (Signed)
Pt's dtr calling re refill requested yesterday not called in yet and pt out, [facility] [address], pls call when done [phone]

## 2013-01-10 NOTE — Telephone Encounter (Signed)
Refill done/ family called.

## 2013-01-13 ENCOUNTER — Ambulatory Visit (INDEPENDENT_AMBULATORY_CARE_PROVIDER_SITE_OTHER): Payer: Medicare Other | Admitting: Gynecology

## 2013-01-13 ENCOUNTER — Encounter: Payer: Self-pay | Admitting: Gynecology

## 2013-01-13 VITALS — BP 140/84 | Ht <= 58 in | Wt 148.0 lb

## 2013-01-13 DIAGNOSIS — L292 Pruritus vulvae: Secondary | ICD-10-CM | POA: Insufficient documentation

## 2013-01-13 DIAGNOSIS — L293 Anogenital pruritus, unspecified: Secondary | ICD-10-CM

## 2013-01-13 DIAGNOSIS — M899 Disorder of bone, unspecified: Secondary | ICD-10-CM

## 2013-01-13 DIAGNOSIS — L290 Pruritus ani: Secondary | ICD-10-CM | POA: Insufficient documentation

## 2013-01-13 DIAGNOSIS — Z8639 Personal history of other endocrine, nutritional and metabolic disease: Secondary | ICD-10-CM

## 2013-01-13 DIAGNOSIS — M8580 Other specified disorders of bone density and structure, unspecified site: Secondary | ICD-10-CM

## 2013-01-13 DIAGNOSIS — N952 Postmenopausal atrophic vaginitis: Secondary | ICD-10-CM

## 2013-01-13 NOTE — Patient Instructions (Addendum)
Shingles Vaccine What You Need to Know WHAT IS SHINGLES?  Shingles is a painful skin rash, often with blisters. It is also called Herpes Zoster or just Zoster.  A shingles rash usually appears on one side of the face or body and lasts from 2 to 4 weeks. Its main symptom is pain, which can be quite severe. Other symptoms of shingles can include fever, headache, chills, and upset stomach. Very rarely, a shingles infection can lead to pneumonia, hearing problems, blindness, brain inflammation (encephalitis), or death.  For about 1 person in 5, severe pain can continue even after the rash clears up. This is called post-herpetic neuralgia.  Shingles is caused by the Varicella Zoster virus. This is the same virus that causes chickenpox. Only someone who has had a case of chickenpox or rarely, has gotten chickenpox vaccine, can get shingles. The virus stays in your body. It can reappear many years later to cause a case of shingles.  You cannot catch shingles from another person with shingles. However, a person who has never had chickenpox (or chickenpox vaccine) could get chickenpox from someone with shingles. This is not very common.  Shingles is far more common in people 50 and older than in younger people. It is also more common in people whose immune systems are weakened because of a disease such as cancer or drugs such as steroids or chemotherapy.  At least 1 million people get shingles per year in the [place]. SHINGLES VACCINE  A vaccine for shingles was licensed in 2006. In clinical trials, the vaccine reduced the risk of shingles by 50%. It can also reduce the pain in people who still get shingles after being vaccinated.  A single dose of shingles vaccine is recommended for adults 60 years of age and older. SOME PEOPLE SHOULD NOT GET SHINGLES VACCINE OR SHOULD WAIT A person should not get shingles vaccine if he or she:  Has ever had a life-threatening allergic reaction to gelatin, the  antibiotic neomycin, or any other component of shingles vaccine. Tell your caregiver if you have any severe allergies.  Has a weakened immune system because of current:  AIDS or another disease that affects the immune system.  Treatment with drugs that affect the immune system, such as prolonged use of high-dose steroids.  Cancer treatment, such as radiation or chemotherapy.  Cancer affecting the bone marrow or lymphatic system, such as leukemia or lymphoma.  Is pregnant, or might be pregnant. Women should not become pregnant until at least 4 weeks after getting shingles vaccine. Someone with a minor illness, such as a cold, may be vaccinated. Anyone with a moderate or severe acute illness should usually wait until he or she recovers before getting the vaccine. This includes anyone with a temperature of 101.3 F (38 C) or higher. WHAT ARE THE RISKS FROM SHINGLES VACCINE?  A vaccine, like any medicine, could possibly cause serious problems, such as severe allergic reactions. However, the risk of a vaccine causing serious harm, or death, is extremely small.  No serious problems have been identified with shingles vaccine. Mild Problems  Redness, soreness, swelling, or itching at the site of the injection (about 1 person in 3).  Headache (about 1 person in 70). Like all vaccines, shingles vaccine is being closely monitored for unusual or severe problems. WHAT IF THERE IS A MODERATE OR SEVERE REACTION? What should I look for? Any unusual condition, such as a severe allergic reaction or a high fever. If a severe allergic reaction   occurred, it would be within a few minutes to an hour after the shot. Signs of a serious allergic reaction can include difficulty breathing, weakness, hoarseness or wheezing, a fast heartbeat, hives, dizziness, paleness, or swelling of the throat. What should I do?  Call your caregiver, or get the person to a caregiver right away.  Tell the caregiver what  happened, the date and time it happened, and when the vaccination was given.  Ask the caregiver to report the reaction by filing a Vaccine Adverse Event Reporting System (VAERS) form. Or, you can file this report through the VAERS web site at www.vaers.LAgents.no or by calling 1-408-365-7257. VAERS does not provide medical advice. HOW CAN I LEARN MORE?  Ask your caregiver. He or she can give you the vaccine package insert or suggest other sources of information.  Contact the Centers for Disease Control and Prevention (CDC):  Call (806)450-3394 (1-800-CDC-INFO).  Visit the CDC website at PicCapture.uy CDC Shingles Vaccine VIS (08/28/08) Document Released: 09/06/2006 Document Revised: 02/01/2012 Document Reviewed: 08/28/2008 [facility] Patient Information 2013 [place], [place].                                                                             Patient information: Lichen sclerosus (The Basics)  What is lichen sclerosus? - Lichen sclerosus is a condition that causes white patches on the skin. It usually happens on the genitals, where it can also cause itching or pain. It is more common in women than in men. Women are most likely to get lichen sclerosus before puberty or after menopause. Puberty is the time in a girl's life when she starts getting periods. Menopause is the time in a woman's life when she stops getting them. But adult women who still have periods can also get lichen sclerosus.  What are the symptoms of lichen sclerosus? - In women and girls, symptoms can include: Itching of the vulva - This is the area around the opening of the vagina (figure 1).  Itching, bleeding, or pain around the anus - This is the opening where bowel movements come out.  Discomfort or dull pain in the vulva  Fluid that comes out of the vagina - Doctors call this "vaginal discharge."  Pain during sex or urination  Skin changes around the vulva or anus, such as:  White, wrinkled skin  Bruises   Cracked skin that sometimes bleeds  In men, lichen sclerosus is most common in men who are not circumcised. Men who are not circumcised have extra skin over the tip of the penis. The skin is called the "foreskin." Symptoms in men can include:  A tight foreskin - It might be hard to pull back for cleaning.  Skin changes on the foreskin that look like scars  A penis that is less sensitive than usual  Painful erections  Trouble urinating  Lichen sclerosus can cause white patches in areas other than the genitals. These can happen in women and men, but children do not usually get them. The patches are most common on the upper body. People with dark skin might have patches that are darker or lighter than their normal skin. Should I see a doctor or nurse? - See your doctor or  nurse if:  You or your child has itching, bleeding, or pain in the vulva or penis  You have white, light, or dark patches on your skin  Will I need tests? - Yes. Your doctor or nurse will do an exam and learn about your symptoms. Adults with lichen sclerosus often have a test called a "biopsy." In this test, a doctor takes a small sample of skin from the area with symptoms. Another doctor looks at the sample under a microscope. It can show if lichen sclerosus or a different condition is causing symptoms. Children do not usually need a biopsy.  People with lichen sclerosus sometimes have other health conditions. The doctor might do tests for these, such as:  Tests on a sample of fluid from the vagina, for women - These tests can show if there is an infection in the vagina.  Blood tests - These can show signs of other medical conditions. How is lichen sclerosus treated? - Doctors cannot get rid of lichen sclerosus that affects the vulva, but they can treat it. It is important for women with this condition to get treatment as soon as possible. Lichen sclerosus that is not treated can make scars form in the vulva. The scars can cause  permanent damage.  Lichen sclerosus treatments for women include: Learning how to keep the vulva healthy (table 1)  Medicines to relieve symptoms and keep scars from forming - These can be an ointment to put on the skin, a shot, or pills taken by mouth.  Surgery to remove scar tissue, if scars have formed. Lichen sclerosus treatments for men include medicines and surgery. The most common surgery is called "circumcision." In this surgery, a doctor removes the foreskin.  Lichen sclerosus that is not on the vulva or penis might not need treatment. If it bothers you, your doctor or nurse can suggest different treatments to relieve symptoms.  What will my life be like? - Lichen sclerosus is usually a lifelong condition. Adults who get lichen sclerosus on the genitals have a higher risk of cancer in the genital area. They should see a doctor or nurse at least 1 or 2 times a year. The doctor will check for cancer and any other changes. Women should check the vulva every month for any changes. A doctor or nurse can show the right way to check.  Doctors do not think children who get lichen sclerosus have the same cancer risk as adults who get it.

## 2013-01-13 NOTE — Progress Notes (Signed)
[name] 27-Jun-1928 [id number]   History:    79 y.o. who presented to the office today with several complaints.she was complaining of a vulvar and perianal pruritus. She has history vitamin D deficiency as well as osteopenia. Her primary physician is Dr.Erin Wallace Cullens who has been doing her lab work. Her cardiologist is Dr. Lourena Simmonds.Patient has a history of a cardiac stent placed in 2001 and he has been prescribing her medication for her hypertension such as atenolol and Diovan.Her last bone density study was in March of 2012. Her lowest T score was at the right femoral neck -1.3 no Frax analysis due to the fact the patient is on drug-free holiday from Fosamax (2001-2007) she is taking her calcium and vitamin D. Last colonoscopy in 2009 reported to be normal. 2010 patient had a biopsy from the fourchette any was benign lentigo.patient's last mammogram was in January 2014 was normal. She states that she does her monthly self breast examination. She states her flu shot and Tdap vaccines are up-to-date but she has not had her shingles vaccine.   Past medical history,surgical history, family history and social history were all reviewed and documented in the EPIC chart.  Gynecologic History No LMP recorded. Patient is postmenopausal. Contraception: post menopausal status Last Pap: 2012. Results were: normal Last mammogram: 2014. Results were: normal  Obstetric History OB History   Grav Para Term Preterm Abortions TAB SAB Ect Mult Living   3 3 3       3      # Outc Date GA Lbr Len/2nd Wgt Sex Del Anes PTL Lv   1 TRM     F SVD  No Yes   2 TRM     F SVD  No Yes   3 TRM     F SVD  No Yes       ROS: A ROS was performed and pertinent positives and negatives are included in the history.  GENERAL: No fevers or chills. HEENT: No change in vision, no earache, sore throat or sinus congestion. NECK: No pain or stiffness. CARDIOVASCULAR: No chest pain or pressure. No palpitations. PULMONARY: No shortness of  breath, cough or wheeze. GASTROINTESTINAL: No abdominal pain, nausea, vomiting or diarrhea, melena or bright red blood per rectum. GENITOURINARY: No urinary frequency, urgency, hesitancy or dysuria. MUSCULOSKELETAL: No joint or muscle pain, no back pain, no recent trauma. DERMATOLOGIC: No rash, no itching, no lesions. ENDOCRINE: No polyuria, polydipsia, no heat or cold intolerance. No recent change in weight. HEMATOLOGICAL: No anemia or easy bruising or bleeding. NEUROLOGIC: No headache, seizures, numbness, tingling or weakness. PSYCHIATRIC: No depression, no loss of interest in normal activity or change in sleep pattern.     Exam: chaperone present  BP 140/84  Ht 4\' 10"  (1.473 m)  Wt 148 lb (67.132 kg)  BMI 30.94 kg/m2  Body mass index is 30.94 kg/(m^2).  General appearance : Well developed well nourished female. No acute distress HEENT: Neck supple, trachea midline, no carotid bruits, no thyroidmegaly Lungs: Clear to auscultation, no rhonchi or wheezes, or rib retractions  Heart: Regular rate and rhythm, no murmurs or gallops Breast:Examined in sitting and supine position were symmetrical in appearance, no palpable masses or tenderness,  no skin retraction, no nipple inversion, no nipple discharge, no skin discoloration, no axillary or supraclavicular lymphadenopathy Abdomen: no palpable masses or tenderness, no rebound or guarding Extremities: no edema or skin discoloration or tenderness  Pelvic:  Bartholin, Urethra, Skene Glands: Within normal limits  Vagina: atrophic changes Cervix: No gross lesions or discharge  Uterus  axial, normal size, shape and consistency, non-tender and mobile  Adnexa  Without masses or tenderness  Anus and perineum  normal   Rectovaginal  normal sphincter tone without palpated masses or tenderness             Hemoccult card provided   .  Assessment/Plan:  79 y.o. female  was found to have perianal and perineal leukoplakic areas highly  suspicious for lichen simplex chronicus or lichen sclerosus. She will return back next week for followup biopsy. We will check her vitamin D level because of her history vitamin D deficiency. She was given a requisition for shingles vaccine and literature information was provided. She was also given Hemoccult card system it to the office for testing. We discussed importance of calcium and vitamin D and regular exercise for osteoporosis prevention. She will schedule her bone density study also which is overdue. Patient with history of osteopenia on drug holiday since she was on Fosamax from the year 2001-2007.   Ok Edwards MD, 11:07 AM 01/13/2013

## 2013-01-14 LAB — VITAMIN D 25 HYDROXY (VIT D DEFICIENCY, FRACTURES): Vit D, 25-Hydroxy: 40 ng/mL (ref 30–89)

## 2013-01-20 ENCOUNTER — Encounter: Payer: Self-pay | Admitting: Gynecology

## 2013-01-20 ENCOUNTER — Ambulatory Visit (INDEPENDENT_AMBULATORY_CARE_PROVIDER_SITE_OTHER): Payer: Medicare Other | Admitting: Gynecology

## 2013-01-20 VITALS — BP 146/88

## 2013-01-20 DIAGNOSIS — K6289 Other specified diseases of anus and rectum: Secondary | ICD-10-CM

## 2013-01-20 DIAGNOSIS — K629 Disease of anus and rectum, unspecified: Secondary | ICD-10-CM

## 2013-01-20 MED ORDER — LIDOCAINE HCL 2 % EX GEL: CUTANEOUS | Status: DC | PRN

## 2013-01-20 NOTE — Patient Instructions (Addendum)
Will call you with pathology report next week. The local anesthetic jell is ready to be picked up at your pharmacy. You can apply it as many times as you like for discomfort.

## 2013-01-20 NOTE — Progress Notes (Signed)
Patient ID: [name], female   DOB: 10-23-1928, 79 y.o.   MRN: [id number]  79 year old patient that was seen in the office on on February 21 was noted to have perianal and perineal leukoplakic areas highly suspicious for lichen simplex chronicus or lichen sclerosus. She was complaining of irritation and itching in this area.  Physical Exam  Genitourinary:     The area noted on the perirectal region as described on the picture above was biopsy in the following manner.the area was cleansed with Betadine solution. 1% lidocaine was infiltrated subdermally. A keypunch biopsy was utilized from the area mostly represented and submitted for histological evaluation. Silver nitrate and Monsel solution were utilized for hemostasis. 2% Xylocaine gel was applied topically after completion. Prescription for 2% Xylocaine gel was called in to her pharmacy. We will call her with the results and manage accordingly within a week.

## 2013-01-24 ENCOUNTER — Telehealth: Payer: Self-pay | Admitting: Cardiovascular Disease

## 2013-01-24 ENCOUNTER — Emergency Department (HOSPITAL_COMMUNITY): Payer: Medicare Other

## 2013-01-24 ENCOUNTER — Emergency Department (HOSPITAL_COMMUNITY)
Admission: EM | Admit: 2013-01-24 | Discharge: 2013-01-24 | Disposition: A | Discharge status: Home / Self Care | Payer: Medicare Other | Attending: Emergency Medicine | Admitting: Emergency Medicine

## 2013-01-24 ENCOUNTER — Encounter (HOSPITAL_COMMUNITY): Payer: Self-pay | Admitting: *Deleted

## 2013-01-24 DIAGNOSIS — Z8739 Personal history of other diseases of the musculoskeletal system and connective tissue: Secondary | ICD-10-CM | POA: Insufficient documentation

## 2013-01-24 DIAGNOSIS — I509 Heart failure, unspecified: Secondary | ICD-10-CM | POA: Insufficient documentation

## 2013-01-24 DIAGNOSIS — Z79899 Other long term (current) drug therapy: Secondary | ICD-10-CM | POA: Insufficient documentation

## 2013-01-24 DIAGNOSIS — R269 Unspecified abnormalities of gait and mobility: Secondary | ICD-10-CM | POA: Insufficient documentation

## 2013-01-24 DIAGNOSIS — R2681 Unsteadiness on feet: Secondary | ICD-10-CM

## 2013-01-24 DIAGNOSIS — I2510 Atherosclerotic heart disease of native coronary artery without angina pectoris: Secondary | ICD-10-CM | POA: Insufficient documentation

## 2013-01-24 DIAGNOSIS — Z9861 Coronary angioplasty status: Secondary | ICD-10-CM | POA: Insufficient documentation

## 2013-01-24 DIAGNOSIS — Z87828 Personal history of other (healed) physical injury and trauma: Secondary | ICD-10-CM | POA: Insufficient documentation

## 2013-01-24 DIAGNOSIS — Z7982 Long term (current) use of aspirin: Secondary | ICD-10-CM | POA: Insufficient documentation

## 2013-01-24 DIAGNOSIS — Z872 Personal history of diseases of the skin and subcutaneous tissue: Secondary | ICD-10-CM | POA: Insufficient documentation

## 2013-01-24 DIAGNOSIS — M79609 Pain in unspecified limb: Secondary | ICD-10-CM

## 2013-01-24 DIAGNOSIS — Z8639 Personal history of other endocrine, nutritional and metabolic disease: Secondary | ICD-10-CM | POA: Insufficient documentation

## 2013-01-24 DIAGNOSIS — J45909 Unspecified asthma, uncomplicated: Secondary | ICD-10-CM | POA: Insufficient documentation

## 2013-01-24 DIAGNOSIS — I10 Essential (primary) hypertension: Secondary | ICD-10-CM | POA: Insufficient documentation

## 2013-01-24 NOTE — ED Notes (Signed)
Pt sent here by her pcp for r/o dvt to R leg.  Pt has been "walking to her left" since last Mon.  Thursday she was seen by her pcp and had neg head CT and neg UA.  He told her to come here to r/o RLE dvt d/t fact that pt is experiencing a hot sensation to R leg.  No redness/warmth noted.  

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
## 2013-01-24 NOTE — ED Notes (Signed)
Pt taken to waiting room with daughter; pt alert and mentating appropriately upon d/c. Pt denies pain. Pt given d/c teaching and follow up care instructions. Pt verbalizes understanding and has no further questions upon d/c.

## 2013-01-24 NOTE — ED Notes (Addendum)
Pt has been sent her to rule out DVT in right leg; pt states for about a week she has been "walking to the left" pt states she would go towards the left when she was walking; pt denies numbness and tingling in affected leg; pt able to move leg; pt capillary refill and pulses normal in affected leg. Pt alert and mentating appropriately.

## 2013-01-24 NOTE — Telephone Encounter (Signed)
New problem    C/O walks to left . Can't walk a straight line.  PCP did a CT scan - normal . Urine culture. Daughter feels like she off balance, patient complaining that her right leg has a temp.

## 2013-01-24 NOTE — ED Notes (Signed)
RN and EDP made aware of VS

## 2013-01-24 NOTE — Telephone Encounter (Signed)
Told to go to ed, they agreed to plan.

## 2013-01-24 NOTE — ED Notes (Signed)
Pt alert and mentating appropriately; pt denies pain and denies needs at the time

## 2013-01-24 NOTE — ED Notes (Signed)
Vascular lab called and informed that pt stable to travel via wheelchair to vascular lab

## 2013-01-24 NOTE — ED Notes (Signed)
Dr Juleen China notified of pts blood pressure and verifies pt stable for d/c at this time

## 2013-01-25 ENCOUNTER — Telehealth: Payer: Self-pay

## 2013-01-25 ENCOUNTER — Telehealth: Payer: Self-pay | Admitting: Cardiovascular Disease

## 2013-01-25 NOTE — Telephone Encounter (Signed)
I had spoken with patient and informed her biopsy benign but you wanted to recheck area and discuss treatment next week.  She can only come on Fridays because she works the other days.  You are off next Friday.  She made her appointment for Friday, March 21 and wanted to be sure that was okay with you.  She did say the biopsy site is doing fine and not bothering her at all.

## 2013-01-25 NOTE — Telephone Encounter (Signed)
See my note below. Meantime patient has called back and schedule follow-up visit for next Tuesday 01/31/13.  Sorry to have bothered you unnecessarily.

## 2013-01-25 NOTE — Telephone Encounter (Signed)
App made with daughter, pt needs a Friday app and wants after 4pm because the patient works, i was not able to give her the preferred time but gave her the soonest Friday available, told her to see her pcp for further work up on her leg, seen in ed and had doppler that was negative for clot. Agreed to plan.

## 2013-01-25 NOTE — Telephone Encounter (Signed)
Pt had MRI done and was also seen in ED and providers next avail is 5/15 but pt daughter wants her seen prior to that and they want to see him

## 2013-01-30 ENCOUNTER — Other Ambulatory Visit: Payer: Self-pay | Admitting: Anesthesiology

## 2013-01-30 DIAGNOSIS — Z1211 Encounter for screening for malignant neoplasm of colon: Secondary | ICD-10-CM

## 2013-01-31 ENCOUNTER — Ambulatory Visit (INDEPENDENT_AMBULATORY_CARE_PROVIDER_SITE_OTHER): Payer: Medicare Other | Admitting: Gynecology

## 2013-01-31 ENCOUNTER — Encounter: Payer: Self-pay | Admitting: Gynecology

## 2013-01-31 ENCOUNTER — Other Ambulatory Visit: Payer: Self-pay | Admitting: Gynecology

## 2013-01-31 DIAGNOSIS — L408 Other psoriasis: Secondary | ICD-10-CM

## 2013-01-31 DIAGNOSIS — L409 Psoriasis, unspecified: Secondary | ICD-10-CM

## 2013-01-31 DIAGNOSIS — Z1211 Encounter for screening for malignant neoplasm of colon: Secondary | ICD-10-CM

## 2013-01-31 MED ORDER — CLOBETASOL PROPIONATE 0.05 % EX CREA: TOPICAL_CREAM | CUTANEOUS | Status: DC

## 2013-01-31 NOTE — Patient Instructions (Addendum)
Patient information: Psoriasis   What is psoriasis? - Psoriasis is a skin condition that makes your skin thick and red. It also often causes silver or white scales to form on the skin. Doctors do not know what causes psoriasis.  What are the symptoms of psoriasis? - The symptoms of psoriasis can include: ?Areas of skin that are dry or red, and that are usually covered with silvery or white scales  ?Rashes on the scalp, genitals, or in skin folds (like the folds you have at the elbow) ?Itching ?Nail changes that make the fingernails or toenails look pitted, crumbly, or different in color  Psoriasis has an emotional effect, too. People with the condition often feel embarrassed by their skin, and some get depressed or anxious. If you have these problems, mention them to your doctor or nurse. You might feel better with counseling or another type of mental health treatment. Is there a test for psoriasis? - No, there is no test. But your doctor or nurse should be able to tell if you have psoriasis by looking at your skin and by asking you questions. In rare cases, doctors take a small sample of skin to check if psoriasis is the problem. What can I do to reduce my symptoms? - Use unscented thick moisturizing creams and ointments to keep the skin from getting too dry.  How is psoriasis treated? - There are treatments that can relieve the symptoms of psoriasis. But the condition cannot be cured.  Treatments for psoriasis come in creams and ointments, pills, or shots. There is also a form of light therapy that can help with psoriasis. All treatments for psoriasis work by slowing the growth of skin, controlling the immune response that causes psoriasis, or both. Most people need to try different treatments or combinations of treatments before they figure out what works best. The medicines that are used most often are called steroids. These medicines are applied to the skin. What is psoriatic arthritis? - Psoriatic  arthritis is a form of arthritis that can affect some people with psoriasis. It causes pain and swelling in the joints.  People with psoriatic arthritis can take medicines to reduce pain and swelling. Exercise and physical therapy can also help. Plus, some of the same medicines that help with the skin problems caused by psoriasis also help with psoriatic arthritis

## 2013-01-31 NOTE — Progress Notes (Signed)
Patient presented to the office today for followup after perirectal biopsy was done in the office on February 28 see previous note with picture. Pathology report as follows:  [place] - SPONGIOTIC PSORIASIFORM DERMATITIS - PAS STAIN IS NEGATIVE FOR FUNGAL ORGANISMS. - NO DYSPLASIA OR MALIGNANCY IDENTIFIED. - SEE COMMENT. Microscopic Comment The findings may represent an irritant/contact dermatitis  Exam: Area biopsied previously healing.  Patient has informed me that her father and her daughter have history of psoriasis and patient also an area behind her ear/scalp there appears evidence of psoriasis as well.  Patient was applying to her perirectal area preparation H. It may have irritated the area even more.  Patient will be placed on clobetasol 0.05% to apply to the area twice a day for 10-14 days and then I would like her to apply once weekly and she will return to the office for followup in 6 months. Literature information on the psoriasis was provided.

## 2013-04-21 ENCOUNTER — Ambulatory Visit (INDEPENDENT_AMBULATORY_CARE_PROVIDER_SITE_OTHER): Payer: Medicare Other | Admitting: Cardiovascular Disease

## 2013-04-21 ENCOUNTER — Encounter: Payer: Self-pay | Admitting: Cardiovascular Disease

## 2013-04-21 VITALS — BP 154/67 | HR 65 | Ht <= 58 in | Wt 148.0 lb

## 2013-04-21 DIAGNOSIS — I10 Essential (primary) hypertension: Secondary | ICD-10-CM

## 2013-04-21 DIAGNOSIS — I2510 Atherosclerotic heart disease of native coronary artery without angina pectoris: Secondary | ICD-10-CM

## 2013-04-21 MED ORDER — CARVEDILOL 12.5 MG PO TABS: 12.5000 mg | ORAL_TABLET | Freq: Two times a day (BID) | ORAL | Status: DC
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
## 2013-04-21 NOTE — Patient Instructions (Addendum)
Your physician wants you to follow-up in: 6 months You will receive a reminder letter in the mail two months in advance. If you don't receive a letter, please call our office to schedule the follow-up appointment.  Your physician has recommended you make the following change in your medication: check blood pressure daily and call with questions or concerns [phone] Stop atenolol Start coreg/ carvedilol 12.5 mg twice a day 12 hours apart.  REDUCE HIGH SODIUM FOODS LIKE CANNED SOUP, GRAVY, SAUCES, READY PREPARED FOODS LIKE FROZEN FOODS; LEAN CUISINE, LASAGNA. BACON, SAUSAGE, LUNCH MEAT, FAST FOODS..hot dogs and chips  DASH Diet The DASH diet stands for "Dietary Approaches to Stop Hypertension." It is a healthy eating plan that has been shown to reduce high blood pressure (hypertension) in as little as 14 days, while also possibly providing other significant health benefits. These other health benefits include reducing the risk of breast cancer after menopause and reducing the risk of type 2 diabetes, heart disease, colon cancer, and stroke. Health benefits also include weight loss and slowing kidney failure in patients with chronic kidney disease.  DIET GUIDELINES  Limit salt (sodium). Your diet should contain less than 1500 mg of sodium daily.  Limit refined or processed carbohydrates. Your diet should include mostly whole grains. Desserts and added sugars should be used sparingly.  Include small amounts of heart-healthy fats. These types of fats include nuts, oils, and tub margarine. Limit saturated and trans fats. These fats have been shown to be harmful in the body. CHOOSING FOODS  The following food groups are based on a 2000 calorie diet. See your Registered Dietitian for individual calorie needs. Grains and Grain Products (6 to 8 servings daily)  Eat More Often: Whole-wheat bread, brown rice, whole-grain or wheat pasta, quinoa, popcorn without added fat or salt (air popped).  Eat Less  Often: White bread, white pasta, white rice, cornbread. Vegetables (4 to 5 servings daily)  Eat More Often: Fresh, frozen, and canned vegetables. Vegetables may be raw, steamed, roasted, or grilled with a minimal amount of fat.  Eat Less Often/Avoid: Creamed or fried vegetables. Vegetables in a cheese sauce. Fruit (4 to 5 servings daily)  Eat More Often: All fresh, canned (in natural juice), or frozen fruits. Dried fruits without added sugar. One hundred percent fruit juice ( cup [237 mL] daily).  Eat Less Often: Dried fruits with added sugar. Canned fruit in light or heavy syrup. [employer], Fish, and Poultry (2 servings or less daily. One serving is 3 to 4 oz [85-114 g]).  Eat More Often: Ninety percent or leaner ground beef, tenderloin, sirloin. Round cuts of beef, chicken breast, [place] breast. All fish. Grill, bake, or broil your meat. Nothing should be fried.  Eat Less Often/Avoid: Fatty cuts of meat, [place], or chicken leg, thigh, or wing. Fried cuts of meat or fish. Dairy (2 to 3 servings)  Eat More Often: Low-fat or fat-free milk, low-fat plain or light yogurt, reduced-fat or part-skim cheese.  Eat Less Often/Avoid: Milk (whole, 2%).Whole milk yogurt. Full-fat cheeses. Nuts, Seeds, and Legumes (4 to 5 servings per week)  Eat More Often: All without added salt.  Eat Less Often/Avoid: Salted nuts and seeds, canned beans with added salt. Fats and Sweets (limited)  Eat More Often: Vegetable oils, tub margarines without trans fats, sugar-free gelatin. Mayonnaise and salad dressings.  Eat Less Often/Avoid: Coconut oils, palm oils, butter, stick margarine, cream, half and half, cookies, candy, pie. FOR MORE INFORMATION The Dash Diet Eating Plan: www.dashdiet.org  Document Released: 10/29/2011 Document Revised: 02/01/2012 Document Reviewed: 10/29/2011 [facility] Patient Information 2014 [address], [place].

## 2013-04-21 NOTE — Assessment & Plan Note (Signed)
Stable

## 2013-04-21 NOTE — Assessment & Plan Note (Signed)
[name] continues to have mildly elevated blood pressure. We will discontinue the atenolol and start her on carvedilol 12.5 mg twice a day. We will give her information on the DASH diet.  We'll see her again in 6 months for followup visit.

## 2013-05-05 ENCOUNTER — Telehealth: Payer: Self-pay | Admitting: Cardiovascular Disease

## 2013-05-05 NOTE — Telephone Encounter (Signed)
New Problem   Pt's daughter states that since her mom has started taking the new BP medication she has felt dizzy/swimmy headed. She wants to speak with a nurse.

## 2013-05-05 NOTE — Telephone Encounter (Signed)
Left message to call back  

## 2013-05-08 NOTE — Telephone Encounter (Signed)
LMTCB

## 2013-05-09 NOTE — Telephone Encounter (Signed)
Follow UP  Ms [name] returning your call

## 2013-05-09 NOTE — Telephone Encounter (Signed)
Per daughter, pt couldn't stand the "drunk" feeling she had when she was taking the carvedilol 12.5mg  BID, she would start to feel less swimmy headed and then it would be time to take another one.  Pt stopped carvedilol Pt went back to atenolol that was stopped till she is advised further. Pt aware she will not hear back till next week.  Please advise.

## 2013-05-12 NOTE — Telephone Encounter (Signed)
Patient did not tolerate coreg.  Ok for her to go back onto Atenolol

## 2013-05-15 NOTE — Telephone Encounter (Signed)
msg left with daughter, med list updated,

## 2013-05-16 ENCOUNTER — Telehealth: Payer: Self-pay | Admitting: Cardiovascular Disease

## 2013-05-16 MED ORDER — ATENOLOL 50 MG PO TABS: 50.0000 mg | ORAL_TABLET | Freq: Every day | ORAL | Status: DC

## 2013-05-16 NOTE — Telephone Encounter (Signed)
Pt's daughter called to clarify how to take Atenolol medication. Pt's daughter is aware that pt is to take 50 mg atenolol one tablet once a day, and to stop the Amlodipine medication. A prescription send to [facility] in [place]. Pt's daughter aware.

## 2013-05-16 NOTE — Telephone Encounter (Signed)
New problem  Per pts daughter pt did not understand instructions on how to take telenol and pts daughter wants someone to explain to her

## 2013-06-09 DIAGNOSIS — R0689 Other abnormalities of breathing: Secondary | ICD-10-CM | POA: Insufficient documentation

## 2013-06-09 DIAGNOSIS — R0600 Dyspnea, unspecified: Secondary | ICD-10-CM

## 2013-06-09 DIAGNOSIS — G4700 Insomnia, unspecified: Secondary | ICD-10-CM | POA: Insufficient documentation

## 2013-06-09 DIAGNOSIS — J45909 Unspecified asthma, uncomplicated: Secondary | ICD-10-CM | POA: Insufficient documentation

## 2013-06-09 DIAGNOSIS — D649 Anemia, unspecified: Secondary | ICD-10-CM | POA: Insufficient documentation

## 2013-06-09 HISTORY — DX: Other abnormalities of breathing: R06.00

## 2013-06-09 HISTORY — DX: Dyspnea, unspecified: R06.89

## 2013-07-05 ENCOUNTER — Other Ambulatory Visit: Payer: Self-pay

## 2013-07-05 MED ORDER — VALSARTAN 160 MG PO TABS: 320.0000 mg | ORAL_TABLET | Freq: Every day | ORAL | Status: DC

## 2013-07-06 ENCOUNTER — Other Ambulatory Visit: Payer: Self-pay | Admitting: *Deleted

## 2013-08-04 ENCOUNTER — Ambulatory Visit (INDEPENDENT_AMBULATORY_CARE_PROVIDER_SITE_OTHER): Payer: Medicare Other | Admitting: Gynecology

## 2013-08-04 ENCOUNTER — Encounter: Payer: Self-pay | Admitting: Gynecology

## 2013-08-04 VITALS — BP 140/80

## 2013-08-04 DIAGNOSIS — L409 Psoriasis, unspecified: Secondary | ICD-10-CM | POA: Insufficient documentation

## 2013-08-04 DIAGNOSIS — L290 Pruritus ani: Secondary | ICD-10-CM

## 2013-08-04 DIAGNOSIS — L408 Other psoriasis: Secondary | ICD-10-CM

## 2013-08-04 MED ORDER — CLOBETASOL PROPIONATE 0.05 % EX CREA: TOPICAL_CREAM | CUTANEOUS | Status: DC

## 2013-08-04 NOTE — Patient Instructions (Signed)
Psoriasis Psoriasis is a common, long-lasting (chronic) inflammation of the skin. It affects both men and women equally, of all ages and all races. Psoriasis cannot be passed from person to person (not contagious). Psoriasis varies from mild to very severe. When severe, it can greatly affect your quality of life. Psoriasis is an inflammatory disorder affecting the skin as well as other organs including the joints (causing an arthritis). With psoriasis, the skin sheds its top layer of cells more rapidly than it does in someone without psoriasis. CAUSES  The cause of psoriasis is largely unknown. Genetics, your immune system, and the environment seem to play a role in causing psoriasis. Factors that can make psoriasis worse include:  Damage or trauma to the skin, such as cuts, scrapes, and sunburn. This damage often causes new areas of psoriasis (lesions).  Winter dryness and lack of sunlight.  Medicines such as lithium, beta-blockers, antimalarial drugs, ACE inhibitors, nonsteroidal anti-inflammatory drugs (ibuprofen, aspirin), and terbinafine. Let your caregiver know if you are taking any of these drugs.  Alcohol. Excessive alcohol use should be avoided if you have psoriasis. Drinking large amounts of alcohol can affect:  How well your psoriasis treatment works.  How safe your psoriasis treatment is.  Smoking. If you smoke, ask your caregiver for help to quit.  Stress.  Bacterial or viral infections.  Arthritis. Arthritis associated with psoriasis (psoriatic arthritis) affects less than 10% of patients with psoriasis. The arthritic intensity does not always match the skin psoriasis intensity. It is important to let your caregiver know if your joints hurt or if they are stiff. SYMPTOMS  The most common form of psoriasis begins with little red bumps that gradually become larger. The bumps begin to form scales that flake off easily. The lower layers of scales stick together. When these scales  are scratched or removed, the underlying skin is tender and bleeds easily. These areas then grow in size and may become large. Psoriasis often creates a rash that looks the same on both sides of the body (symmetrical). It often affects the elbows, knees, groin, genitals, arms, legs, scalp, and nails. Affected nails often have pitting, loosen, thicken, crumble, and are difficult to treat.  "Inverse psoriasis"occurs in the armpits, under breasts, in skin folds, and around the groin, buttocks, and genitals.  "Guttate psoriasis" generally occurs in children and young adults following a recent sore throat (strep throat). It begins with many small, red, scaly spots on the skin. It clears spontaneously in weeks or a few months without treatment. DIAGNOSIS  Psoriasis is diagnosed by physical exam. A tissue sample (biopsy) may also be taken. TREATMENT The treatment of psoriasis depends on your age, health, and living conditions.  Steroid (cortisone) creams, lotions, and ointments may be used. These treatments are associated with thinning of the skin, blood vessels that get larger (dilated), loss of skin pigmentation, and easy bruising. It is important to use these steroids as directed by your caregiver. Only treat the affected areas and not the normal, unaffected skin. People on long-term steroid treatment should wear a medical alert bracelet. Injections may be used in areas that are difficult to treat.  Scalp treatments are available as shampoos, solutions, sprays, foams, and oils. Avoid scratching the scalp and picking at the scales.  Anthralin medicine works well on areas that are difficult to treat. However, it stains clothes and skin and may cause temporary irritation.  Synthetic vitamin D (calcipotriene)can be used on small areas. It is available by prescription. The forms   of synthetic vitamin D available in health food stores do not help with psoriasis.  Coal tarsare available in various strengths  for psoriasis that is difficult to treat. They are one of the longest used treatments for difficult to treat psoriasis. However, they are messy to use.  Light therapy (UV therapy) can be carefully and professionally monitored in a dermatologist's office. Careful sunbathing is helpful for many people as directed by your caregiver. The exposure should be just long enough to cause a mild redness (erythema) of your skin. Avoid sunburn as this may make the condition worse. Sunscreen (SPF of 30 or higher) should be used to protect against sunburn. Cataracts, wrinkles, and skin aging are some of the harmful side effects of light therapy.  If creams (topical medicines) fail, there are several other options for systemic or oral medicines your caregiver can suggest. Psoriasis can sometimes be very difficult to treat. It can come and go. It is necessary to follow up with your caregiver regularly if your psoriasis is difficult to treat. Usually, with persistence you can get a good amount of relief. Maintaining consistent care is important. Do not change caregivers just because you do not see immediate results. It may take several trials to find the right combination of treatment for you. PREVENTING FLARE-UPS  Wear gloves while you wash dishes, while cleaning, and when you are outside in the cold.  If you have radiators, place a bowl of water or damp towel on the radiator. This will help put water back in the air. You can also use a humidifier to keep the air moist. Try to keep the humidity at about 60% in your home.  Apply moisturizer while your skin is still damp from bathing or showering. This traps water in the skin.  Avoid long, hot baths or showers. Keep soap use to a minimum. Soaps dry out the skin and wash away the protective oils. Use a fragrance free, dye free soap.  Drink enough water and fluids to keep your urine clear or pale yellow. Not drinking enough water depletes your skin's water  supply.  Turn off the heat at night and keep it low during the day. Cool air is less drying. SEEK MEDICAL CARE IF:  You have increasing pain in the affected areas.  You have uncontrolled bleeding in the affected areas.  You have increasing redness or warmth in the affected areas.  You start to have pain or stiffness in your joints.  You start feeling depressed about your condition.  You have a fever. Document Released: 11/06/2000 Document Revised: 02/01/2012 Document Reviewed: 05/04/2011 Exit[employer] Patient Information 2014 [employer].  

## 2013-08-04 NOTE — Progress Notes (Signed)
Patient presented to the office for 6 months followup after peri-rectal biopsy had demonstrated following:  Perianus  - SPONGIOTIC PSORIASIFORM DERMATITIS  - PAS STAIN IS NEGATIVE FOR FUNGAL ORGANISMS.  - NO DYSPLASIA OR MALIGNANCY IDENTIFIED.  - SEE COMMENT.  Microscopic Comment  The findings may represent an irritant/contact dermatitis  Patient had been prescribed clobetasol 0.05% to apply to the area twice a day for 10-14 days and then she was applied it once weekly. Patient stated that her pruritus has decreased significantly and she is otherwise asymptomatic. She had been suffering from perianal parotids for many years.  Exam: There were no vulvar, perineal, or perirectal lesions.  Assessment/plan: Patient with perineal  PSORIASIFORM DERMATITIS which has responded to clobetasol. She is going to continue to apply a thin film to the perineum and perirectal area twice a week.

## 2013-10-13 ENCOUNTER — Encounter: Payer: Self-pay | Admitting: Cardiovascular Disease

## 2013-10-13 ENCOUNTER — Ambulatory Visit (INDEPENDENT_AMBULATORY_CARE_PROVIDER_SITE_OTHER): Payer: Medicare Other | Admitting: Cardiovascular Disease

## 2013-10-13 ENCOUNTER — Encounter (INDEPENDENT_AMBULATORY_CARE_PROVIDER_SITE_OTHER): Payer: Self-pay

## 2013-10-13 VITALS — BP 144/80 | HR 62 | Ht <= 58 in | Wt 152.0 lb

## 2013-10-13 DIAGNOSIS — E785 Hyperlipidemia, unspecified: Secondary | ICD-10-CM

## 2013-10-13 DIAGNOSIS — I2510 Atherosclerotic heart disease of native coronary artery without angina pectoris: Secondary | ICD-10-CM

## 2013-10-13 DIAGNOSIS — I10 Essential (primary) hypertension: Secondary | ICD-10-CM

## 2013-10-13 LAB — HEPATIC FUNCTION PANEL
AST: 27 U/L (ref 0–37)
Albumin: 3.8 g/dL (ref 3.5–5.2)
Alkaline Phosphatase: 39 U/L (ref 39–117)
Total Protein: 6.6 g/dL (ref 6.0–8.3)

## 2013-10-13 LAB — BASIC METABOLIC PANEL
Calcium: 9.4 mg/dL (ref 8.4–10.5)
Chloride: 100 mEq/L (ref 96–112)
Creatinine, Ser: 0.7 mg/dL (ref 0.4–1.2)
Sodium: 138 mEq/L (ref 135–145)

## 2013-10-13 LAB — LIPID PANEL
Cholesterol: 165 mg/dL (ref 0–200)
HDL: 40.1 mg/dL (ref >39.00)
LDL Cholesterol: 111 mg/dL — ABNORMAL HIGH (ref 0–99)
Triglycerides: 71 mg/dL (ref 0.0–149.0)

## 2013-10-13 NOTE — Patient Instructions (Signed)
Your physician recommends that you return for lab work in: TODAY   Your physician wants you to follow-up in: 1 YEAR  You will receive a reminder letter in the mail two months in advance. If you don't receive a letter, please call our office to schedule the follow-up appointment.     
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
## 2013-10-13 NOTE — Assessment & Plan Note (Signed)
She denies any angina.  Continue current meds

## 2013-10-17 ENCOUNTER — Telehealth: Payer: Self-pay | Admitting: Cardiovascular Disease

## 2013-10-17 NOTE — Telephone Encounter (Signed)
Labs reviewed and sent to pcp.

## 2013-10-17 NOTE — Telephone Encounter (Signed)
New message    Calling to get lab results from 11-21.

## 2013-12-26 ENCOUNTER — Encounter: Payer: Self-pay | Admitting: Gynecology

## 2014-02-26 ENCOUNTER — Other Ambulatory Visit: Payer: Self-pay | Admitting: Cardiovascular Disease

## 2014-02-28 ENCOUNTER — Other Ambulatory Visit: Payer: Self-pay | Admitting: *Deleted

## 2014-02-28 MED ORDER — VALSARTAN 160 MG PO TABS: 320.0000 mg | ORAL_TABLET | Freq: Every day | ORAL | Status: DC

## 2014-03-09 DIAGNOSIS — E669 Obesity, unspecified: Secondary | ICD-10-CM | POA: Insufficient documentation

## 2014-03-23 HISTORY — PX: CORONARY ANGIOPLASTY WITH STENT PLACEMENT: SHX49

## 2014-04-06 ENCOUNTER — Telehealth: Payer: Self-pay | Admitting: Cardiovascular Disease

## 2014-04-06 NOTE — Telephone Encounter (Signed)
Calling stating she has had chest discomfort past several days when she walks any distance.  Today she took NTG with relief.  Advised her and her daughter that she needed to go to ER.  Both refused.  Wanted an appointment in office next week.  Worked her in to see Dr. Elease HashimotoNahser on Tues 5/19. She states she only has the discomfort if walks any distance.  Advised she needs to go to ER if she has any pain over the weekend.  They understand and will comply.

## 2014-04-06 NOTE — Telephone Encounter (Signed)
New Message:  Pt's daughter states the pt experiences chest pain or chest discomfort when she walks.. States her chest pain was so bad this morning that she took a Nitro... PT wants an appt ASAP w/ Dr. Elease HashimotoNahser.. Also requests to speak w/ nurse

## 2014-04-10 ENCOUNTER — Encounter: Payer: Self-pay | Admitting: Cardiovascular Disease

## 2014-04-10 ENCOUNTER — Ambulatory Visit (INDEPENDENT_AMBULATORY_CARE_PROVIDER_SITE_OTHER): Payer: Medicare Other | Admitting: Cardiovascular Disease

## 2014-04-10 ENCOUNTER — Other Ambulatory Visit: Payer: Self-pay | Admitting: Cardiovascular Disease

## 2014-04-10 ENCOUNTER — Encounter: Payer: Self-pay | Admitting: Nurse Practitioner

## 2014-04-10 VITALS — BP 132/73 | HR 60 | Ht <= 58 in | Wt 150.0 lb

## 2014-04-10 DIAGNOSIS — I200 Unstable angina: Secondary | ICD-10-CM

## 2014-04-10 DIAGNOSIS — I209 Angina pectoris, unspecified: Secondary | ICD-10-CM

## 2014-04-10 DIAGNOSIS — I10 Essential (primary) hypertension: Secondary | ICD-10-CM

## 2014-04-10 DIAGNOSIS — I2510 Atherosclerotic heart disease of native coronary artery without angina pectoris: Secondary | ICD-10-CM

## 2014-04-10 DIAGNOSIS — E785 Hyperlipidemia, unspecified: Secondary | ICD-10-CM

## 2014-04-10 LAB — CBC WITH DIFFERENTIAL/PLATELET
BASOS ABS: 0 10*3/uL (ref 0.0–0.1)
Basophils Relative: 0.6 % (ref 0.0–3.0)
EOS ABS: 0.4 10*3/uL (ref 0.0–0.7)
Eosinophils Relative: 6 % — ABNORMAL HIGH (ref 0.0–5.0)
HCT: 38.5 % (ref 36.0–46.0)
Hemoglobin: 13.1 g/dL (ref 12.0–15.0)
LYMPHS PCT: 23.2 % (ref 12.0–46.0)
Lymphs Abs: 1.5 10*3/uL (ref 0.7–4.0)
MCHC: 34 g/dL (ref 30.0–36.0)
MCV: 90.8 fl (ref 78.0–100.0)
Monocytes Absolute: 0.5 10*3/uL (ref 0.1–1.0)
Monocytes Relative: 7.8 % (ref 3.0–12.0)
Neutro Abs: 3.9 10*3/uL (ref 1.4–7.7)
Neutrophils Relative %: 62.4 % (ref 43.0–77.0)
Platelets: 226 10*3/uL (ref 150.0–400.0)
RBC: 4.24 Mil/uL (ref 3.87–5.11)
RDW: 14.3 % (ref 11.5–15.5)
WBC: 6.3 10*3/uL (ref 4.0–10.5)

## 2014-04-10 LAB — PROTIME-INR
INR: 0.9 ratio (ref 0.8–1.0)
PROTHROMBIN TIME: 10.5 s (ref 9.6–13.1)

## 2014-04-10 LAB — BASIC METABOLIC PANEL
BUN: 12 mg/dL (ref 6–23)
CHLORIDE: 92 meq/L — AB (ref 96–112)
CO2: 31 mEq/L (ref 19–32)
CREATININE: 0.8 mg/dL (ref 0.4–1.2)
Calcium: 9.1 mg/dL (ref 8.4–10.5)
GFR: 77.01 mL/min (ref >60.00)
Glucose, Bld: 102 mg/dL — ABNORMAL HIGH (ref 70–99)
POTASSIUM: 4.5 meq/L (ref 3.5–5.1)
Sodium: 128 mEq/L — ABNORMAL LOW (ref 135–145)

## 2014-04-10 MED ORDER — ASPIRIN EC 81 MG PO TBEC: 81.0000 mg | DELAYED_RELEASE_TABLET | Freq: Every day | ORAL | Status: DC

## 2014-04-10 NOTE — Assessment & Plan Note (Signed)
[name]  presents today with recent onset of angina. She has a history of an RCA stent in 2001. She now has clear-cut episodes of angina when she walks to the mailbox. Is associated with shortness of breath and severe chest tightness. She gets very fatigued. The pain resolved after she stops and rests for about 5 minutes. She also has been taking nitroglycerin glycerin.  We'll set her up for cardiac catheterization later this week. We have discussed the risks, benefits, and options of cardiac apposition. She understands and agrees to proceed.
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
## 2014-04-10 NOTE — Patient Instructions (Addendum)
Your physician has requested that you have a cardiac catheterization. Cardiac catheterization is used to diagnose and/or treat various heart conditions. Doctors may recommend this procedure for a number of different reasons. The most common reason is to evaluate chest pain. Chest pain can be a symptom of coronary artery disease (CAD), and cardiac catheterization can show whether plaque is narrowing or blocking your heart's arteries. This procedure is also used to evaluate the valves, as well as measure the blood flow and oxygen levels in different parts of your heart. For further information please visit [email or web address] Please follow instruction sheet, as given.  Your physician recommends that you have lab work:  TODAY  Your physician has recommended you make the following change in your medication:  DECREASE Aspirin to 81 mg once daily

## 2014-04-11 ENCOUNTER — Ambulatory Visit (HOSPITAL_COMMUNITY)
Admission: RE | Admit: 2014-04-11 | Discharge: 2014-04-12 | Disposition: A | Discharge status: Home / Self Care | Payer: Medicare Other | Source: Ambulatory Visit | Attending: Interventional Cardiology | Admitting: Interventional Cardiology

## 2014-04-11 ENCOUNTER — Encounter (HOSPITAL_COMMUNITY): Payer: Self-pay | Admitting: General Practice

## 2014-04-11 ENCOUNTER — Encounter (HOSPITAL_COMMUNITY)
Admission: RE | Disposition: A | Discharge status: Home / Self Care | Payer: Medicare Other | Source: Ambulatory Visit | Attending: Interventional Cardiology

## 2014-04-11 DIAGNOSIS — Z79899 Other long term (current) drug therapy: Secondary | ICD-10-CM | POA: Insufficient documentation

## 2014-04-11 DIAGNOSIS — Z7982 Long term (current) use of aspirin: Secondary | ICD-10-CM | POA: Insufficient documentation

## 2014-04-11 DIAGNOSIS — Y831 Surgical operation with implant of artificial internal device as the cause of abnormal reaction of the patient, or of later complication, without mention of misadventure at the time of the procedure: Secondary | ICD-10-CM | POA: Insufficient documentation

## 2014-04-11 DIAGNOSIS — I200 Unstable angina: Secondary | ICD-10-CM

## 2014-04-11 DIAGNOSIS — T82897A Other specified complication of cardiac prosthetic devices, implants and grafts, initial encounter: Secondary | ICD-10-CM | POA: Insufficient documentation

## 2014-04-11 DIAGNOSIS — E785 Hyperlipidemia, unspecified: Secondary | ICD-10-CM | POA: Diagnosis present

## 2014-04-11 DIAGNOSIS — D649 Anemia, unspecified: Secondary | ICD-10-CM | POA: Insufficient documentation

## 2014-04-11 DIAGNOSIS — E871 Hypo-osmolality and hyponatremia: Secondary | ICD-10-CM | POA: Insufficient documentation

## 2014-04-11 DIAGNOSIS — I2510 Atherosclerotic heart disease of native coronary artery without angina pectoris: Secondary | ICD-10-CM | POA: Insufficient documentation

## 2014-04-11 DIAGNOSIS — Z955 Presence of coronary angioplasty implant and graft: Secondary | ICD-10-CM

## 2014-04-11 DIAGNOSIS — J45909 Unspecified asthma, uncomplicated: Secondary | ICD-10-CM | POA: Insufficient documentation

## 2014-04-11 DIAGNOSIS — I5032 Chronic diastolic (congestive) heart failure: Secondary | ICD-10-CM | POA: Insufficient documentation

## 2014-04-11 DIAGNOSIS — I209 Angina pectoris, unspecified: Secondary | ICD-10-CM | POA: Diagnosis present

## 2014-04-11 DIAGNOSIS — I509 Heart failure, unspecified: Secondary | ICD-10-CM | POA: Insufficient documentation

## 2014-04-11 DIAGNOSIS — I10 Essential (primary) hypertension: Secondary | ICD-10-CM | POA: Diagnosis present

## 2014-04-11 HISTORY — DX: Shortness of breath: R06.02

## 2014-04-11 HISTORY — PX: LEFT HEART CATHETERIZATION WITH CORONARY ANGIOGRAM: SHX5451

## 2014-04-11 HISTORY — DX: Unspecified osteoarthritis, unspecified site: M19.90

## 2014-04-11 HISTORY — DX: Gastro-esophageal reflux disease without esophagitis: K21.9

## 2014-04-11 HISTORY — DX: Angina pectoris, unspecified: I20.9

## 2014-04-11 LAB — POCT ACTIVATED CLOTTING TIME: Activated Clotting Time: 764 seconds

## 2014-04-11 SURGERY — LEFT HEART CATHETERIZATION WITH CORONARY ANGIOGRAM
Anesthesia: LOCAL

## 2014-04-11 MED ORDER — SODIUM CHLORIDE 0.9 % IV BOLUS (SEPSIS)
250.0000 mL | Freq: Once | INTRAVENOUS | Status: AC
  Administered 2014-04-11: 15:00:00 250 mL via INTRAVENOUS

## 2014-04-11 MED ORDER — NITROGLYCERIN 0.4 MG SL SUBL: 0.4000 mg | SUBLINGUAL_TABLET | SUBLINGUAL | Status: DC | PRN

## 2014-04-11 MED ORDER — IPRATROPIUM BROMIDE 0.06 % NA SOLN
1.0000 | Freq: Every day | NASAL | Status: DC
  Administered 2014-04-11 – 2014-04-12 (×2): 1 via NASAL
  Filled 2014-04-11: qty 15

## 2014-04-11 MED ORDER — CLOPIDOGREL BISULFATE 75 MG PO TABS
75.0000 mg | ORAL_TABLET | Freq: Every day | ORAL | Status: DC
  Administered 2014-04-12: 10:00:00 75 mg via ORAL
  Filled 2014-04-11: qty 1

## 2014-04-11 MED ORDER — VERAPAMIL HCL 2.5 MG/ML IV SOLN
INTRAVENOUS | Status: AC
  Filled 2014-04-11: qty 2

## 2014-04-11 MED ORDER — NITROGLYCERIN 0.2 MG/ML ON CALL CATH LAB
INTRAVENOUS | Status: AC
  Filled 2014-04-11: qty 1

## 2014-04-11 MED ORDER — SODIUM CHLORIDE 0.9 % IV BOLUS (SEPSIS)
250.0000 mL | Freq: Once | INTRAVENOUS | Status: AC | PRN
Start: 2014-04-11 — End: 2014-04-11

## 2014-04-11 MED ORDER — FENTANYL CITRATE 0.05 MG/ML IJ SOLN
INTRAMUSCULAR | Status: AC
  Filled 2014-04-11: qty 2

## 2014-04-11 MED ORDER — LORATADINE 10 MG PO TABS
10.0000 mg | ORAL_TABLET | Freq: Every day | ORAL | Status: DC
  Administered 2014-04-12: 10:00:00 10 mg via ORAL
  Filled 2014-04-11: qty 1

## 2014-04-11 MED ORDER — SODIUM CHLORIDE 0.9 % IJ SOLN: 3.0000 mL | Freq: Two times a day (BID) | INTRAMUSCULAR | Status: DC

## 2014-04-11 MED ORDER — ASPIRIN 81 MG PO CHEW
81.0000 mg | CHEWABLE_TABLET | ORAL | Status: AC
  Administered 2014-04-11: 81 mg via ORAL
  Filled 2014-04-11: qty 1

## 2014-04-11 MED ORDER — ATENOLOL 50 MG PO TABS
50.0000 mg | ORAL_TABLET | Freq: Every day | ORAL | Status: DC
  Administered 2014-04-12: 10:00:00 50 mg via ORAL
  Filled 2014-04-11: qty 1

## 2014-04-11 MED ORDER — BIVALIRUDIN 250 MG IV SOLR
INTRAVENOUS | Status: AC
Start: 2014-04-11 — End: 2014-04-11
  Filled 2014-04-11: qty 250

## 2014-04-11 MED ORDER — SODIUM CHLORIDE 0.9 % IV SOLN
INTRAVENOUS | Status: DC
  Administered 2014-04-11: 1000 mL via INTRAVENOUS

## 2014-04-11 MED ORDER — LIDOCAINE HCL (PF) 1 % IJ SOLN
INTRAMUSCULAR | Status: AC
  Filled 2014-04-11: qty 30

## 2014-04-11 MED ORDER — HYDROCHLOROTHIAZIDE 12.5 MG PO CAPS
12.5000 mg | ORAL_CAPSULE | Freq: Every day | ORAL | Status: DC
  Administered 2014-04-11: 12.5 mg via ORAL
  Filled 2014-04-11: qty 1

## 2014-04-11 MED ORDER — SODIUM CHLORIDE 0.9 % IJ SOLN: 3.0000 mL | INTRAMUSCULAR | Status: DC | PRN

## 2014-04-11 MED ORDER — IRBESARTAN 150 MG PO TABS
150.0000 mg | ORAL_TABLET | Freq: Every day | ORAL | Status: DC
  Administered 2014-04-12: 150 mg via ORAL
  Filled 2014-04-11: qty 1

## 2014-04-11 MED ORDER — HEPARIN SODIUM (PORCINE) 1000 UNIT/ML IJ SOLN
INTRAMUSCULAR | Status: AC
  Filled 2014-04-11: qty 1

## 2014-04-11 MED ORDER — ISOSORBIDE MONONITRATE ER 60 MG PO TB24
60.0000 mg | ORAL_TABLET | Freq: Every day | ORAL | Status: DC
  Administered 2014-04-11 – 2014-04-12 (×2): 60 mg via ORAL
  Filled 2014-04-11 (×2): qty 1

## 2014-04-11 MED ORDER — HYDROCHLOROTHIAZIDE 25 MG PO TABS
12.5000 mg | ORAL_TABLET | Freq: Every day | ORAL | Status: DC
  Filled 2014-04-11: qty 0.5

## 2014-04-11 MED ORDER — HEPARIN (PORCINE) IN NACL 2-0.9 UNIT/ML-% IJ SOLN
INTRAMUSCULAR | Status: AC
  Filled 2014-04-11: qty 1000

## 2014-04-11 MED ORDER — SODIUM CHLORIDE 0.9 % IV SOLN: 250.0000 mL | INTRAVENOUS | Status: DC | PRN

## 2014-04-11 MED ORDER — SODIUM CHLORIDE 0.9 % IV SOLN
INTRAVENOUS | Status: DC
  Administered 2014-04-11: 11:00:00 via INTRAVENOUS

## 2014-04-11 MED ORDER — MIDAZOLAM HCL 2 MG/2ML IJ SOLN
INTRAMUSCULAR | Status: AC
  Filled 2014-04-11: qty 2

## 2014-04-11 MED ORDER — CLOPIDOGREL BISULFATE 300 MG PO TABS
ORAL_TABLET | ORAL | Status: AC
  Filled 2014-04-11: qty 2

## 2014-04-11 MED ORDER — ASPIRIN EC 325 MG PO TBEC
325.0000 mg | DELAYED_RELEASE_TABLET | Freq: Every day | ORAL | Status: DC
  Administered 2014-04-12: 10:00:00 325 mg via ORAL
  Filled 2014-04-11: qty 1

## 2014-04-11 NOTE — Progress Notes (Addendum)
Called by nursing regarding BP running low - 75 systolic. She is asymptomatic. Pt got all her AM meds this morning per nurse. On post-cath IVF. Will give 250 cc bolus now, repeat if BP does not come up. Will DC HCTZ. Has no further antihypertensives due tonight. She was on Diovan 320mg  daily at home - note this has been decreased to irbesartan 150mg  daily (half dose equivalent to 160mg  of Diovan). Monitor BP closely. Have asked nursing to update us on BP later this evening so we can make some determinations if AM meds need to be scaled back further. Dayna Dunn PA-C
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
## 2014-04-11 NOTE — Progress Notes (Signed)
TR BAND REMOVAL  LOCATION:    right radial  DEFLATED PER PROTOCOL:    yes  TIME BAND OFF / DRESSING APPLIED:    1430   SITE UPON ARRIVAL:    Level 0  SITE AFTER BAND REMOVAL:    Level 0  REVERSE ALLEN'S TEST:     positive  CIRCULATION SENSATION AND MOVEMENT:    Within Normal Limits   yes  COMMENTS:  Tolerated procedure well 

## 2014-04-11 NOTE — CV Procedure (Signed)
Left Heart Catheterization with Coronary Angiography and PCI Report  [name]  80 y.o.  female 03/26/1928  Procedure Date: 04/11/2014 Referring Physician: Kristeen MissPhilip Nahser, M.D. Primary Cardiologist: Kristeen MissPhilip Nahser, M.D.  INDICATIONS: Class III angina pectoris, Prior coronary stent 2002  PROCEDURE: 1. Left heart catheterization; 2. Coronary angiography; 3. Left ventriculography; 4. DES distal RCA  CONSENT:  The risks, benefits, and details of the procedure were explained in detail to the patient. Risks including death, stroke, heart attack, kidney injury, allergy, limb ischemia, bleeding and radiation injury were discussed.  The patient verbalized understanding and wanted to proceed.  Informed written consent was obtained.  PROCEDURE TECHNIQUE:  After Xylocaine anesthesia a 5 French Slender sheath was placed in the right radial artery with an angiocath and the modified Seldinger technique.  Coronary angiography was done using a 5 F JR 4 and JL 3.5 cm catheter.  Left ventriculography was done using the JR 4 catheter and hand injection.   The digital images were reviewed. The patient has three-vessel coronary disease. There is moderately severe distal circumflex, and moderately severe to severe mid and distal LAD disease. There is severe in-stent restenosis, greater than 95% in the distal margin of the previously placed mid right coronary stent. Of the vessels identified to be disease, the right coronary is clearly the most severely involved and felt to be the culprit for the patient's limiting angina. We proceeded with PCI on the severe restenosis in the mid right coronary. The origin of the right coronary has a somewhat anterior takeoff and was difficult to cannulate from the radial approach. There is also significant tortuosity within the subclavian making catheter torque ability difficult.  She was loaded with bivalirudin and ACT documented to be therapeutic. 600 mg of Plavix was  given orally. A 0.014 Pro-water guidewire was used after we chose a 6 [place]'s "right coronary guide catheter. The vessel wiring was difficult because of poor guide support. We ultimately placed a buddy wire to better stabilize opposition of the vessel. We initially attempted to use a Cutting Balloon, 2.5 x 10 millimeters in diameter. It would not cross the stenosis. We then use a 2.5 x 12 mm long Euphora to predilate. We will then able to use a 2.5 x 10 cutting balloon. Cutting balloon at 8 atmospheres for 60 seconds left chest discomfort. Predilatation with you for was performed to 17 atmospheres. We then positioned and deployed a 3.0 x 18 Xience Alpine with some difficulty. The buddy wire was retrieved prior to deployment. Postdilatation was performed with a 3.25 x 12 [place] track to 14 atmospheres. 2 inflations were performed. Post procedure the patient's chest discomfort resolved. No acute EKG changes were noted.  Hemostasis was achieved with the radial wrist band.   CONTRAST:  Total of 200 cc.  COMPLICATIONS:  None   HEMODYNAMICS:  Aortic pressure 102/49 mmHg; LV pressure 104/80 mmHg; LVEDP 11 mm mercury  ANGIOGRAPHIC DATA:   The left main coronary artery is heavily calcified but widely patent.  The left anterior descending artery is heavily calcified with a segmental 60-80% mid vessel stenosis after the first diagonal. The apical LAD after a moderate size diagonal contains 80% stenosis..  The left circumflex artery is is large. Three obtuse marginals arise from the circumflex.. The distal circumflex contains segmental obstruction in the 70% range before small third obtuse marginal arises..  The right coronary artery is dominant, and heavily calcified. A mid stent is noted by cinefluoroscopy. The distal  one third of the stent contains ISR of greater than 95%. The distal vessel contains moderate irregularities as does the proximal vessel. There is 50% stenoses both proximally and distal to  the stent. Large PDA and left ventricular branches arise distally.  PCI RESULTS: The greater than 95% mid RCA ISR reduced to 0% with TIMI grade 3 flow after angioplasty with cutting balloon and restenting with a 30 by 18 Xience Alpine, post dilated to 3.25 mm at 14 atmospheres.  LEFT VENTRICULOGRAM:  Left ventricular angiogram was done in the 30 RAO projection and revealed symmetrical hyperdynamic function with estimated EF of 65%   IMPRESSIONS:  1. Class III angina pectoris felt to be related to high-grade in-stent restenosis of the right coronary.  2. Angioplasty and restenting of the mid RCA with reduction in greater than 95% stenosis to 0% with TIMI grade 3 flow.  3. Moderate to moderately severe mid to distal LAD and distal circumflex.  4. Hyperdynamic left ventricular systolic function   RECOMMENDATION:  Aspirin and Plavix  Add long-acting nitrates to treat potential ischemia in the apical LAD territory  Will need a lower dose of diuretic therapy due to hyponatremia noted on the precath laboratory with sodium of 128.  IV fluids overnight.

## 2014-04-11 NOTE — Interval H&P Note (Signed)
Cath Lab Visit (complete for each Cath Lab visit)  Clinical Evaluation Leading to the Procedure:   ACS: no  Non-ACS:    Anginal Classification: CCS III  Anti-ischemic medical therapy: Minimal Therapy (1 class of medications)  Non-Invasive Test Results: No non-invasive testing performed  Prior CABG: No previous CABG      History and Physical Interval Note:  04/11/2014 7:38 AM  [name]  has presented today for surgery, with the diagnosis of angina  The various methods of treatment have been discussed with the patient and family. After consideration of risks, benefits and other options for treatment, the patient has consented to  Procedure(s): LEFT HEART CATHETERIZATION WITH CORONARY ANGIOGRAM (N/A) as a surgical intervention .  The patient's history has been reviewed, patient examined, no change in status, stable for surgery.  I have reviewed the patient's chart and labs.  Questions were answered to the patient's satisfaction.     Lyn RecordsHenry W Smith III

## 2014-04-12 LAB — BASIC METABOLIC PANEL
BUN: 11 mg/dL (ref 6–23)
CO2: 26 mEq/L (ref 19–32)
Calcium: 8.7 mg/dL (ref 8.4–10.5)
Chloride: 96 mEq/L (ref 96–112)
Creatinine, Ser: 0.71 mg/dL (ref 0.50–1.10)
GFR calc Af Amer: 89 mL/min — ABNORMAL LOW (ref >90)
GFR, EST NON AFRICAN AMERICAN: 77 mL/min — AB (ref >90)
GLUCOSE: 101 mg/dL — AB (ref 70–99)
Potassium: 4.4 mEq/L (ref 3.7–5.3)
Sodium: 133 mEq/L — ABNORMAL LOW (ref 137–147)

## 2014-04-12 LAB — CBC
HCT: 32.6 % — ABNORMAL LOW (ref 36.0–46.0)
Hemoglobin: 10.9 g/dL — ABNORMAL LOW (ref 12.0–15.0)
MCH: 30.5 pg (ref 26.0–34.0)
MCHC: 33.4 g/dL (ref 30.0–36.0)
MCV: 91.3 fL (ref 78.0–100.0)
Platelets: 188 10*3/uL (ref 150–400)
RBC: 3.57 MIL/uL — ABNORMAL LOW (ref 3.87–5.11)
RDW: 14.2 % (ref 11.5–15.5)
WBC: 5.8 10*3/uL (ref 4.0–10.5)

## 2014-04-12 LAB — PLATELET INHIBITION P2Y12: Platelet Function  P2Y12: 272 [PRU] (ref 194–418)

## 2014-04-12 MED ORDER — ISOSORBIDE MONONITRATE ER 60 MG PO TB24: 60.0000 mg | ORAL_TABLET | Freq: Every day | ORAL | Status: DC

## 2014-04-12 MED ORDER — HYDROCHLOROTHIAZIDE 25 MG PO TABS: 12.5000 mg | ORAL_TABLET | Freq: Every day | ORAL | Status: DC

## 2014-04-12 MED ORDER — VALSARTAN 160 MG PO TABS: 160.0000 mg | ORAL_TABLET | Freq: Every day | ORAL | Status: DC

## 2014-04-12 MED ORDER — CLOPIDOGREL BISULFATE 75 MG PO TABS: 75.0000 mg | ORAL_TABLET | Freq: Every day | ORAL | Status: DC

## 2014-04-12 MED FILL — Sodium Chloride IV Soln 0.9%: INTRAVENOUS | Qty: 50 | Status: AC

## 2014-04-12 NOTE — Progress Notes (Signed)
CARDIAC REHAB PHASE I   PRE:  Rate/Rhythm: 84 SR  BP:  Supine: 138/60  Sitting:   Standing:    SaO2:   MODE:  Ambulation: 500 ft   POST:  Rate/Rhythm: 110 ST  BP:  Supine:   Sitting: 148/63  Standing:    SaO2: 97 RA [id number] Pt tolerated ambulation well without c/o of cp. She has some DOE, RA sat after walk 97%. Pt admits to not doing much walking or exercise. 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 Completed stent discharge education with pt. She voices understanding Pt agrees to Outpt. CRP in [place], will send referral.  Cynthia CopaLisa Malacai Grantz RN 04/12/2014 9:01 AM

## 2014-04-12 NOTE — Discharge Instructions (Signed)
PLEASE REMEMBER TO BRING ALL OF YOUR MEDICATIONS TO EACH OF YOUR FOLLOW-UP OFFICE VISITS. ° °PLEASE ATTEND ALL SCHEDULED FOLLOW-UP APPOINTMENTS.  ° °Activity: Increase activity slowly as tolerated. You may shower, but no soaking baths (or swimming) for 1 week. No driving for 2 days. No lifting over 5 lbs for 1 week. No sexual activity for 1 week.  ° °You May Return to Work: in 1 week (if applicable) ° °Wound Care: You may wash cath site gently with soap and water. Keep cath site clean and dry. If you notice pain, swelling, bleeding or pus at your cath site, please call [phone]. ° ° ° °Cardiac Cath Site Care °Refer to this sheet in the next few weeks. These instructions provide you with information on caring for yourself after your procedure. Your caregiver may also give you more specific instructions. Your treatment has been planned according to current medical practices, but problems sometimes occur. Call your caregiver if you have any problems or questions after your procedure. °HOME CARE INSTRUCTIONS °· You may shower 24 hours after the procedure. Remove the bandage (dressing) and gently wash the site with plain soap and water. Gently pat the site dry.  °· Do not apply powder or lotion to the site.  °· Do not sit in a bathtub, swimming pool, or whirlpool for 5 to 7 days.  °· No bending, squatting, or lifting anything over 10 pounds (4.5 kg) as directed by your caregiver.  °· Inspect the site at least twice daily.  °· Do not drive home if you are discharged the same day of the procedure. Have someone else drive you.  °· You may drive 24 hours after the procedure unless otherwise instructed by your caregiver.  °What to expect: °· Any bruising will usually fade within 1 to 2 weeks.  °· Blood that collects in the tissue (hematoma) may be painful to the touch. It should usually decrease in size and tenderness within 1 to 2 weeks.  °SEEK IMMEDIATE MEDICAL CARE IF: °· You have unusual pain at the site or down the  affected limb.  °· You have redness, warmth, swelling, or pain at the site.  °· You have drainage (other than a small amount of blood on the dressing).  °· You have chills.  °· You have a fever or persistent symptoms for more than 72 hours.  °· You have a fever and your symptoms suddenly get worse.  °· Your leg becomes pale, cool, tingly, or numb.  °· You have heavy bleeding from the site. Hold pressure on the site.  °Document Released: 12/12/2010 Document Revised: 10/29/2011 Document Reviewed: 12/12/2010 °ExitCare® Patient Information ©2012 [employer]. ° °

## 2014-04-12 NOTE — Discharge Summary (Signed)
CARDIOLOGY DISCHARGE SUMMARY   Patient ID: [name] MRN: [id number] DOB/AGE: 26-Feb-1928 80 y.o.  Admit date: 04/11/2014 Discharge date: 04/12/2014  PCP: Aura Dials, MD Primary Cardiologist: Dr. Elease Hashimoto  Primary Discharge Diagnosis:   Angina, class III  Secondary Discharge Diagnosis:    HYPERLIPIDEMIA   HTN (hypertension)   Hyponatremia   Anemia   Procedures: 1. Left heart catheterization; 2. Coronary angiography; 3. Left ventriculography; 4. DES distal RCA   Hospital Course: [name] is a 80 y.o. female with a history of CAD. She saw Dr. Elease Hashimoto for progressive exertional angina associated with dyspnea. Cardiac catheterization was indicated and she came to the hospital for the procedure on 05/20.  Full results are below. She had angioplasty and stenting to the mid RCA for in-stent restenosis. Moderate disease in the LAD and circumflex will be treated medically. Her EF is preserved.  This-procedure, she had some problems with hypotension, treated with IV fluids and responded. Her medications were adjusted, with a reduction in her home ARB and diuretic. She had nitrates added to her medication regimen for the residual coronary artery disease. Additionally, her diuretic dose was decreased because of hyponatremia seen on pre-admission labs.  On 05/21, she was seen by Dr. Katrinka Blazing and all data were reviewed. She was mildly anemic which was felt secondary to procedures and blood draws. This can be followed as an outpatient. Her blood pressure had stabilized and she was having no chest pain or shortness of breath with ambulation. No further inpatient workup is indicated and she is considered stable for discharge, to follow up as an outpatient.  BP 138/60  Pulse 84  Temp(Src) 98.1 F (36.7 C) (Oral)  Resp 18  Ht 4\' 10"  (1.473 m)  Wt 156 lb 8.4 oz (71 kg)  BMI 32.72 kg/m2  SpO2 95% General: Well developed, well nourished, female in no acute distress Head: Eyes  PERRLA, No xanthomas.   Normocephalic and atraumatic  Lungs: Clear bilaterally to auscultation. Heart: HRRR S1 S2, without MRG.  Pulses are 2+ & equal. No carotid bruit. No JVD. Abdomen: Bowel sounds are present, abdomen soft and non-tender without masses or  hernias noted. Msk: Normal strength and tone for age. Extremities: No clubbing, cyanosis or edema.  Right radial cath site without ecchymosis or hematoma  Skin:  No rashes or lesions noted. Neuro: Alert and oriented X 3. Psych:  Good affect, responds appropriately   Labs:  Lab Results  Component Value Date   WBC 5.8 04/12/2014   HGB 10.9* 04/12/2014   HCT 32.6* 04/12/2014   MCV 91.3 04/12/2014   PLT 188 04/12/2014     Recent Labs Lab 04/12/14 0500  NA 133*  K 4.4  CL 96  CO2 26  BUN 11  CREATININE 0.71  CALCIUM 8.7  GLUCOSE 101*    Recent Labs  04/10/14 1051  INR 0.9      Radiology: No results found.  Cardiac Cath: 04/11/2014 ANGIOGRAPHIC DATA: The left main coronary artery is heavily calcified but widely patent.  The left anterior descending artery is heavily calcified with a segmental 60-80% mid vessel stenosis after the first diagonal. The apical LAD after a moderate size diagonal contains 80% stenosis..  The left circumflex artery is is large. Three obtuse marginals arise from the circumflex.. The distal circumflex contains segmental obstruction in the 70% range before small third obtuse marginal arises..  The right coronary artery is dominant, and heavily calcified. A mid stent is noted by  cinefluoroscopy. The distal one third of the stent contains ISR of greater than 95%. The distal vessel contains moderate irregularities as does the proximal vessel. There is 50% stenoses both proximally and distal to the stent. Large PDA and left ventricular branches arise distally.  PCI RESULTS: The greater than 95% mid RCA ISR reduced to 0% with TIMI grade 3 flow after angioplasty with cutting balloon and restenting with a 30  by 18 Xience Alpine, post dilated to 3.25 mm at 14 atmospheres.  LEFT VENTRICULOGRAM: Left ventricular angiogram was done in the 30 RAO projection and revealed symmetrical hyperdynamic function with estimated EF of 65%  IMPRESSIONS: 1. Class III angina pectoris felt to be related to high-grade in-stent restenosis of the right coronary.  2. Angioplasty and restenting of the mid RCA with reduction in greater than 95% stenosis to 0% with TIMI grade 3 flow.  3. Moderate to moderately severe mid to distal LAD and distal circumflex.  4. Hyperdynamic left ventricular systolic function  RECOMMENDATION: Aspirin and Plavix  Add long-acting nitrates to treat potential ischemia in the apical LAD territory  Will need a lower dose of diuretic therapy due to hyponatremia noted on the precath laboratory with sodium of 128.  EKG: 04/12/2014 Sinus rhythm, left bundle branch block Vent. rate 76 BPM PR interval 208 ms QRS duration 152 ms QT/QTc 432/486 ms P-R-T axes 57 10 80  FOLLOW UP PLANS AND APPOINTMENTS Allergies  Allergen Reactions  . Codeine Cough  . Crestor [Rosuvastatin Calcium] Cough  . Pantoprazole Sodium Cough  . Pravachol Cough  . Zocor [Simvastatin - High Dose] Cough     Medication List         aspirin EC 81 MG tablet  Take 81 mg by mouth daily.     atenolol 50 MG tablet  Commonly known as:  TENORMIN  Take 50 mg by mouth daily.     cetirizine 10 MG tablet  Commonly known as:  ZYRTEC  Take 10 mg by mouth daily.     clobetasol cream 0.05 %  Commonly known as:  TEMOVATE  Apply 1 application topically 2 (two) times a week. No specific days     clopidogrel 75 MG tablet  Commonly known as:  PLAVIX  Take 1 tablet (75 mg total) by mouth daily with breakfast.     hydrochlorothiazide 25 MG tablet  Commonly known as:  HYDRODIURIL  Take 0.5 tablets (12.5 mg total) by mouth daily.     ipratropium 0.06 % nasal spray  Commonly known as:  ATROVENT  Place 1 spray into both nostrils  daily.     isosorbide mononitrate 60 MG 24 hr tablet  Commonly known as:  IMDUR  Take 1 tablet (60 mg total) by mouth daily.     nitroGLYCERIN 0.4 MG SL tablet  Commonly known as:  NITROSTAT  Place 1 tablet (0.4 mg total) under the tongue every 5 (five) minutes as needed.     valsartan 160 MG tablet  Commonly known as:  DIOVAN  Take 1 tablet (160 mg total) by mouth daily.        Discharge Instructions   Diet - low sodium heart healthy    Complete by:  As directed      Increase activity slowly    Complete by:  As directed           Follow-up Information   Follow up with Tereso NewcomerScott Weaver, PA-C On 05/02/2014. (See for Dr. Elease HashimotoNahser at 10:25 am)    Specialty:  Physician Assistant   Contact information:   [address] [place] [address] [phone]       BRING ALL MEDICATIONS WITH YOU TO FOLLOW UP APPOINTMENTS  Time spent with patient to include physician time: 41 min Signed: Darrol JumpRhonda G Barrett, PA-C 04/12/2014, 8:51 AM Co-Sign MD

## 2014-04-13 NOTE — Discharge Summary (Signed)
Patient has done well post PCI and is ambulating with not symptoms. Denies side effects related to Imdur. Plan and management has been constructed with patient ready for discharge today and f/u with Dr. Elease Hashimoto. Note is accurate.

## 2014-04-17 ENCOUNTER — Telehealth: Payer: Self-pay | Admitting: Cardiovascular Disease

## 2014-04-17 NOTE — Telephone Encounter (Signed)
Called patient's daughter after speaking with Dr. Elease Hashimoto by telephone regarding patient's symptoms.  When I spoke to patient's daughter she reported that patient has been taking Diovan 320 mg as prescribed by Boyd Kerbs, Practitioner at Dr. Bonney Leitz office.  Dr. Elease Hashimoto advised patient hold diuretic for a couple of days and decrease beta blocker to 1/2 dose for a couple of days, however he was not aware that patient has been taking 320 mg Diovan daily.  I advised daughter that patient should take Diovan 160 mg daily and continue Atenolol 50 mg daily and hold the HCTZ tomorrow and Thursday since patient has already taken today.  I advised her to call me back Thursday to report if the patient has any additional episodes of dizziness and/or vomiting.  Daughter verbalized understanding and agreement and states she advised her mother to take medications with food at breakfast.

## 2014-04-17 NOTE — Telephone Encounter (Signed)
Spoke with patient's daughter who states patient had episode of dizziness x 1 on Sunday that resolved and then today patient became dizzy after arriving at work and vomited x 1.  Patient reported to daughter that dizziness resolved after 15 minutes and she drove herself back home.  Patient reported that BP when she arrived back home was 118/79; pulse unknown.  Daughter states she thinks the patient is waiting too long to eat breakfast after taking morning medications and that the patient is not drinking enough water.  Daughter advised patient to eat protein at breakfast instead of dairy products.  I reviewed medications with patient's daughter and she agreed - states she advised her mother to take one blood pressure medicine in the morning and one in the evening.  I advised daughter that I will send message to Dr. Elease Hashimoto and call her back with his advice.  Daughter verbalized understanding and agreement.

## 2014-04-17 NOTE — Telephone Encounter (Signed)
Pt's daughter called in concerned. Since her mother has been taking plavix she been real dizzy and throwing up. Please call and advise.

## 2014-04-19 ENCOUNTER — Encounter: Payer: Self-pay | Admitting: Cardiovascular Disease

## 2014-04-19 ENCOUNTER — Encounter: Payer: Self-pay | Admitting: Nurse Practitioner

## 2014-04-19 NOTE — Telephone Encounter (Signed)
Left message for patient to call back  

## 2014-04-19 NOTE — Telephone Encounter (Signed)
Spoke with patient's daughter, [name], who states patient is doing okay today; states patient is trying to drink more water instead of soda.  Daughter states patient's blood pressure has been better today - states it was low x 1 yesterday but it improved 30 minutes after patient drank a glass of water.  Daughter states patient took 1 NTG on Tuesday night for 1 episode of chest pain that awakened her.  Patient denies any chest pain since that time.  I advised daughter to monitor patient's episodes of chest pain and to notify us if it worsens or occurs more frequently.  Daughter states patient is taking medications with food and stomach issues seem to have improved.  Dr. Elease Hashimoto, who is in the office today, is aware and advised for patient to continue on current regimen of holding Hydrochlorothiazide and to take Diovan 160 mg.  I advised daughter to call back next week to assess how well this regimen is working.  Daughter verbalized understanding and agreement.

## 2014-04-19 NOTE — Telephone Encounter (Signed)
Follow up    Pt's daughter reported pt is doing better.  BP still drops some. Tus night had CP took Nitro during the night.   Today/127/75 hr 76.  Does the pt need to restart her fluid pill?   Pt's daughter would like a call back if any questions.   Pt needs a Dr note to return to work on Monday emailed to [email or web address].

## 2014-04-24 ENCOUNTER — Telehealth: Payer: Self-pay | Admitting: Cardiovascular Disease

## 2014-04-24 NOTE — Telephone Encounter (Signed)
Spoke with [name], patient's daughter who states patient needs a work clearance faxed to [employer] at [phone]; phone number [phone].  [name] states patient is feeling well - states she is happy to be back at work and says she feels great  Spoke with Anthoney Harada at [employer] who states work clearance has been received and thanked me for the call

## 2014-04-24 NOTE — Telephone Encounter (Signed)
Patient needs a note faxed immediately to work, stating it is okay for her to work. The fax number is [phone]. Any questions please call.

## 2014-04-25 ENCOUNTER — Other Ambulatory Visit: Payer: Self-pay | Admitting: Cardiovascular Disease

## 2014-05-02 ENCOUNTER — Encounter: Payer: Medicare Other | Admitting: Physician Assistant

## 2014-05-04 ENCOUNTER — Other Ambulatory Visit: Payer: Self-pay | Admitting: *Deleted

## 2014-05-04 ENCOUNTER — Ambulatory Visit (INDEPENDENT_AMBULATORY_CARE_PROVIDER_SITE_OTHER): Payer: Medicare Other | Admitting: Nurse Practitioner

## 2014-05-04 ENCOUNTER — Encounter: Payer: Self-pay | Admitting: Nurse Practitioner

## 2014-05-04 VITALS — BP 148/78 | HR 65 | Ht <= 58 in | Wt 153.1 lb

## 2014-05-04 DIAGNOSIS — I10 Essential (primary) hypertension: Secondary | ICD-10-CM

## 2014-05-04 DIAGNOSIS — Z9861 Coronary angioplasty status: Secondary | ICD-10-CM

## 2014-05-04 DIAGNOSIS — E785 Hyperlipidemia, unspecified: Secondary | ICD-10-CM

## 2014-05-04 DIAGNOSIS — Z955 Presence of coronary angioplasty implant and graft: Secondary | ICD-10-CM

## 2014-05-04 DIAGNOSIS — I2510 Atherosclerotic heart disease of native coronary artery without angina pectoris: Secondary | ICD-10-CM

## 2014-05-04 DIAGNOSIS — D649 Anemia, unspecified: Secondary | ICD-10-CM

## 2014-05-04 LAB — BASIC METABOLIC PANEL
BUN: 12 mg/dL (ref 6–23)
CO2: 31 mEq/L (ref 19–32)
Calcium: 8.9 mg/dL (ref 8.4–10.5)
Chloride: 101 mEq/L (ref 96–112)
Creatinine, Ser: 0.8 mg/dL (ref 0.4–1.2)
GFR: 78.18 mL/min (ref >60.00)
Glucose, Bld: 90 mg/dL (ref 70–99)
Potassium: 3.7 mEq/L (ref 3.5–5.1)
Sodium: 137 mEq/L (ref 135–145)

## 2014-05-04 LAB — CBC
HCT: 32.3 % — ABNORMAL LOW (ref 36.0–46.0)
Hemoglobin: 10.8 g/dL — ABNORMAL LOW (ref 12.0–15.0)
MCHC: 33.4 g/dL (ref 30.0–36.0)
MCV: 92.6 fl (ref 78.0–100.0)
Platelets: 193 10*3/uL (ref 150.0–400.0)
RBC: 3.48 Mil/uL — ABNORMAL LOW (ref 3.87–5.11)
RDW: 15.1 % (ref 11.5–15.5)
WBC: 5.4 10*3/uL (ref 4.0–10.5)

## 2014-05-04 MED ORDER — NITROGLYCERIN 0.4 MG SL SUBL: 0.4000 mg | SUBLINGUAL_TABLET | SUBLINGUAL | Status: DC | PRN
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
## 2014-05-04 NOTE — Patient Instructions (Addendum)
Stay on your current medicines - restart the HCTZ - 1/2 pill daily  Continue to momitor your BP at home - call us if staying above 150 most of the time  We will check lab today  See Dr. Elease HashimotoNahser in 3 months  Call the [facility] office at [phone] if you have any questions, problems or concerns.

## 2014-06-08 ENCOUNTER — Other Ambulatory Visit (INDEPENDENT_AMBULATORY_CARE_PROVIDER_SITE_OTHER): Payer: Medicare Other

## 2014-06-08 DIAGNOSIS — D649 Anemia, unspecified: Secondary | ICD-10-CM

## 2014-06-08 LAB — CBC WITH DIFFERENTIAL/PLATELET
Basophils Absolute: 0 10*3/uL (ref 0.0–0.1)
Basophils Relative: 0.6 % (ref 0.0–3.0)
Eosinophils Absolute: 0.2 10*3/uL (ref 0.0–0.7)
Eosinophils Relative: 4.9 % (ref 0.0–5.0)
HCT: 34.3 % — ABNORMAL LOW (ref 36.0–46.0)
Hemoglobin: 11.6 g/dL — ABNORMAL LOW (ref 12.0–15.0)
Lymphocytes Relative: 29.1 % (ref 12.0–46.0)
Lymphs Abs: 1.4 10*3/uL (ref 0.7–4.0)
MCHC: 33.8 g/dL (ref 30.0–36.0)
MCV: 91.3 fl (ref 78.0–100.0)
Monocytes Absolute: 0.4 10*3/uL (ref 0.1–1.0)
Monocytes Relative: 9.2 % (ref 3.0–12.0)
Neutro Abs: 2.7 10*3/uL (ref 1.4–7.7)
Neutrophils Relative %: 56.2 % (ref 43.0–77.0)
Platelets: 184 10*3/uL (ref 150.0–400.0)
RBC: 3.76 Mil/uL — ABNORMAL LOW (ref 3.87–5.11)
RDW: 13.9 % (ref 11.5–15.5)
WBC: 4.7 10*3/uL (ref 4.0–10.5)

## 2014-07-30 ENCOUNTER — Other Ambulatory Visit: Payer: Self-pay | Admitting: Cardiovascular Disease

## 2014-08-10 ENCOUNTER — Ambulatory Visit (INDEPENDENT_AMBULATORY_CARE_PROVIDER_SITE_OTHER): Payer: Medicare Other | Admitting: Gynecology

## 2014-08-10 ENCOUNTER — Encounter: Payer: Self-pay | Admitting: Gynecology

## 2014-08-10 VITALS — BP 148/86 | Ht <= 58 in | Wt 151.0 lb

## 2014-08-10 DIAGNOSIS — M949 Disorder of cartilage, unspecified: Secondary | ICD-10-CM

## 2014-08-10 DIAGNOSIS — M899 Disorder of bone, unspecified: Secondary | ICD-10-CM

## 2014-08-10 DIAGNOSIS — M8580 Other specified disorders of bone density and structure, unspecified site: Secondary | ICD-10-CM

## 2014-08-10 DIAGNOSIS — N898 Other specified noninflammatory disorders of vagina: Secondary | ICD-10-CM

## 2014-08-10 DIAGNOSIS — L408 Other psoriasis: Secondary | ICD-10-CM

## 2014-08-10 DIAGNOSIS — L409 Psoriasis, unspecified: Secondary | ICD-10-CM

## 2014-08-10 DIAGNOSIS — Z23 Encounter for immunization: Secondary | ICD-10-CM

## 2014-08-10 DIAGNOSIS — Z8639 Personal history of other endocrine, nutritional and metabolic disease: Secondary | ICD-10-CM | POA: Insufficient documentation

## 2014-08-10 MED ORDER — HYDROCORTISONE 2.5 % EX CREA: TOPICAL_CREAM | CUTANEOUS | Status: DC

## 2014-08-10 NOTE — Patient Instructions (Addendum)
Influenza Virus Vaccine injection (Fluarix) What is this medicine? INFLUENZA VIRUS VACCINE (in floo EN zuh VAHY ruhs vak SEEN) helps to reduce the risk of getting influenza also known as the flu. This medicine may be used for other purposes; ask your health care provider or pharmacist if you have questions. COMMON BRAND NAME(S): Fluarix, Fluzone What should I tell my health care provider before I take this medicine? They need to know if you have any of these conditions: -bleeding disorder like hemophilia -fever or infection -Guillain-Barre syndrome or other neurological problems -immune system problems -infection with the human immunodeficiency virus (HIV) or AIDS -low blood platelet counts -multiple sclerosis -an unusual or allergic reaction to influenza virus vaccine, eggs, chicken proteins, latex, gentamicin, other medicines, foods, dyes or preservatives -pregnant or trying to get pregnant -breast-feeding How should I use this medicine? This vaccine is for injection into a muscle. It is given by a health care professional. A copy of Vaccine Information Statements will be given before each vaccination. Read this sheet carefully each time. The sheet may change frequently. Talk to your pediatrician regarding the use of this medicine in children. Special care may be needed. Overdosage: If you think you have taken too much of this medicine contact a poison control center or emergency room at once. NOTE: This medicine is only for you. Do not share this medicine with others. What if I miss a dose? This does not apply. What may interact with this medicine? -chemotherapy or radiation therapy -medicines that lower your immune system like etanercept, anakinra, infliximab, and adalimumab -medicines that treat or prevent blood clots like warfarin -phenytoin -steroid medicines like prednisone or cortisone -theophylline -vaccines This list may not describe all possible interactions. Give your  health care provider a list of all the medicines, herbs, non-prescription drugs, or dietary supplements you use. Also tell them if you smoke, drink alcohol, or use illegal drugs. Some items may interact with your medicine. What should I watch for while using this medicine? Report any side effects that do not go away within 3 days to your doctor or health care professional. Call your health care provider if any unusual symptoms occur within 6 weeks of receiving this vaccine. You may still catch the flu, but the illness is not usually as bad. You cannot get the flu from the vaccine. The vaccine will not protect against colds or other illnesses that may cause fever. The vaccine is needed every year. What side effects may I notice from receiving this medicine? Side effects that you should report to your doctor or health care professional as soon as possible: -allergic reactions like skin rash, itching or hives, swelling of the face, lips, or tongue Side effects that usually do not require medical attention (report to your doctor or health care professional if they continue or are bothersome): -fever -headache -muscle aches and pains -pain, tenderness, redness, or swelling at site where injected -weak or tired This list may not describe all possible side effects. Call your doctor for medical advice about side effects. You may report side effects to FDA at 1-800-FDA-1088. Where should I keep my medicine? This vaccine is only given in a clinic, pharmacy, doctor's office, or other health care setting and will not be stored at home. NOTE: This sheet is a summary. It may not cover all possible information. If you have questions about this medicine, talk to your doctor, pharmacist, or health care provider.  2015, Elsevier/Gold Standard. (2008-06-06 09:30:40)   Remember to schedule  your bone density this year!

## 2014-08-10 NOTE — Progress Notes (Addendum)
[name] July 13, 1928 [id number]   History:    80 y.o.  for annual exam and for followup on her perianal psoriasis that was diagnosed last year. Patient had been prescribed clobetasol and has stopped it for the past few months because of cost. Patient had stents placed in 2001 and recently this year.She has history vitamin D deficiency as well as osteopenia. Her primary physician is Dr.Erin Wallace Cullens who has been doing her lab work. Her cardiologist is Dr. Lourena Simmonds.Her last bone density study was in March of 2012. Her lowest T score was at the right femoral neck -1.3 no Frax analysis due to the fact the patient is on drug-free holiday from Fosamax (2001-2007) she is taking her calcium and vitamin D. Last colonoscopy in 2009 reported to be normal. 2010 patient had a biopsy from the fourchette . Patient is otherwise asymptomatic today. Patient requested a flu vaccine today. Tdap vaccines up to date.     Past medical history,surgical history, family history and social history were all reviewed and documented in the EPIC chart.  Gynecologic History No LMP recorded. Patient is postmenopausal. Contraception: post menopausal status Last Pap: 2012. Results were: normal Last mammogram: 2015. Results were: normal  Obstetric History OB History  Gravida Para Term Preterm AB SAB TAB Ectopic Multiple Living  # Outcome Date GA Lbr Len/2nd Weight Sex Delivery Anes PTL Lv  3 TRM     F SVD  N Y  2 TRM     F SVD  N Y  1 TRM     F SVD  N Y       ROS: A ROS was performed and pertinent positives and negatives are included in the history.  GENERAL: No fevers or chills. HEENT: No change in vision, no earache, sore throat or sinus congestion. NECK: No pain or stiffness. CARDIOVASCULAR: No chest pain or pressure. No palpitations. PULMONARY: No shortness of breath, cough or wheeze. GASTROINTESTINAL: No abdominal pain, nausea, vomiting or diarrhea, melena or bright red blood per rectum.  GENITOURINARY: No urinary frequency, urgency, hesitancy or dysuria. MUSCULOSKELETAL: No joint or muscle pain, no back pain, no recent trauma. DERMATOLOGIC: No rash, no itching, no lesions. ENDOCRINE: No polyuria, polydipsia, no heat or cold intolerance. No recent change in weight. HEMATOLOGICAL: No anemia or easy bruising or bleeding. NEUROLOGIC: No headache, seizures, numbness, tingling or weakness. PSYCHIATRIC: No depression, no loss of interest in normal activity or change in sleep pattern.     Exam: chaperone present  BP 148/86  Ht  (1.473 m)  Wt 151 lb (68.493 kg)  BMI 31.57 kg/m2  Body mass index is 31.57 kg/(m^2).  General appearance : Well developed well nourished female. No acute distress HEENT: Neck supple, trachea midline, no carotid bruits, no thyroidmegaly Lungs: Clear to auscultation, no rhonchi or wheezes, or rib retractions  Heart: Regular rate and rhythm, no murmurs or gallops Breast:Examined in sitting and supine position were symmetrical in appearance, no palpable masses or tenderness,  no skin retraction, no nipple inversion, no nipple discharge, no skin discoloration, no axillary or supraclavicular lymphadenopathy Abdomen: no palpable masses or tenderness, no rebound or guarding Extremities: no edema or skin discoloration or tenderness  Pelvic:  Fourchette:              Physical Exam  Genitourinary:        Bartholin, Urethra, Skene Glands: Within normal limits  Vagina: No gross lesions or discharge, atrophic changes  Cervix: No gross lesions or discharge  Uterus  axial, normal size, shape and consistency, non-tender and mobile  Adnexa  Without masses or tenderness  Anus and perineum  normal   Rectovaginal  normal sphincter tone without palpated masses or tenderness             Hemoccult PCP provides     Assessment/Plan:  80 y.o. female originally exam and followup on her perianal psoriasis there was diagnosed last year. The psoriasis is  healed. I will prescribe her to present corticosteroid cream to apply once a week to see this is cheaper yhan  the clobetasol. Patient would many years ago had history of lentigo at the fourchette but this is more white in appearance/leukoplakic. Will have returned back to the office in 1-2 weeks for colposcopy and biopsy. She was reminded to schedule her overdue bone density study. Will check her vitamin D level today. The patient received vaccine today.  Note: This dictation was prepared with  Dragon/digital dictation along withSmart phrase technology. Any transcriptional errors that result from this process are unintentional.   Ok Edwards MD, 3:44 PM 08/10/2014

## 2014-08-13 ENCOUNTER — Ambulatory Visit (INDEPENDENT_AMBULATORY_CARE_PROVIDER_SITE_OTHER): Payer: Medicare Other | Admitting: Cardiovascular Disease

## 2014-08-13 ENCOUNTER — Encounter: Payer: Self-pay | Admitting: Cardiovascular Disease

## 2014-08-13 VITALS — BP 136/80 | HR 64 | Ht <= 58 in | Wt 147.0 lb

## 2014-08-13 DIAGNOSIS — E785 Hyperlipidemia, unspecified: Secondary | ICD-10-CM

## 2014-08-13 DIAGNOSIS — I2510 Atherosclerotic heart disease of native coronary artery without angina pectoris: Secondary | ICD-10-CM
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
## 2014-08-13 NOTE — Patient Instructions (Signed)

## 2014-08-13 NOTE — Assessment & Plan Note (Signed)
When I last saw her  several months ago she was having symptoms of unstable angina. She was sent for cardiac catheterization and was found to have a tight RCA in-stent restenosis. She underwent cutting balloon procedure. She's done well since that time. She had any episodes of angina.  Continue with the same medications he'll see her again in 6 months for followup visit and fasting labs.

## 2014-08-16 ENCOUNTER — Telehealth: Payer: Self-pay | Admitting: Cardiovascular Disease

## 2014-08-16 NOTE — Telephone Encounter (Signed)
Spoke with patient's caregiver, [name], who states patient has been feeling nauseated in the morning after taking her medications.  [name] states that patient reports she does not feel nauseous every morning; states she feels better on the weekends when she takes her medications later in the morning.  I advised [name] that patient can take medications later in the morning every day to see if this helps.  She reports patient ate a cheeseburger yesterday morning prior to taking her meds and I advised that she should try something less greasy like toast to see if this helps.  [name] reports patient did not take medications today to see if she feels better.  I advised [name] that patient should not discontinue any medications, that she should try taking them later in the morning to see if this helps.  I advised her to call back if patient continues to have problems.

## 2014-08-16 NOTE — Telephone Encounter (Signed)
New message          C/o being sick when taking medications / pt has not taken any medications this morning and she feels fine

## 2014-09-07 ENCOUNTER — Encounter: Payer: Self-pay | Admitting: Gynecology

## 2014-09-07 ENCOUNTER — Ambulatory Visit (INDEPENDENT_AMBULATORY_CARE_PROVIDER_SITE_OTHER): Payer: Medicare Other | Admitting: Gynecology

## 2014-09-07 VITALS — BP 120/76

## 2014-09-07 DIAGNOSIS — N9089 Other specified noninflammatory disorders of vulva and perineum: Secondary | ICD-10-CM

## 2014-09-07 NOTE — Progress Notes (Signed)
   Patient is a 80 year old who was seen for annual exam last month. At that time a hypopigmented area of the fourchette was noted. Patient many years ago had history of lentigo at the fourchette. Patient also has had history of perianal psoriasis that was diagnosed last year. She had been prescribed clobetasol stop it because of cost and I prescribed her corticosteroid cream to apply once we she is not done so was asymptomatic today. Patient had a normal Pap smear in 2012.  Patient underwent detail colposcopic evaluation of the external genitalia. Year before she only demonstrated decreased pigmented area from previous episiotomy scar and no suspicious lesion after applying acetic acid. The rest of the perineum, vulva and perirectal areas normal. A speculum was inserted into the vagina and only atrophic changes were noted. No lesions vagina and cervix was  Assessment/plan: Atrophic changes of bulbi and fourchette no suspicious lesion. Patient was reassured. Patient will return to the office next year for annual exam or when necessary.

## 2014-09-24 ENCOUNTER — Encounter: Payer: Self-pay | Admitting: Gynecology

## 2014-10-19 ENCOUNTER — Other Ambulatory Visit: Payer: Self-pay | Admitting: Cardiovascular Disease

## 2014-11-01 ENCOUNTER — Encounter (HOSPITAL_COMMUNITY): Payer: Self-pay | Admitting: Interventional Cardiology

## 2015-02-08 ENCOUNTER — Ambulatory Visit (INDEPENDENT_AMBULATORY_CARE_PROVIDER_SITE_OTHER): Payer: Medicare Other | Admitting: Cardiovascular Disease

## 2015-02-08 ENCOUNTER — Encounter: Payer: Self-pay | Admitting: Cardiovascular Disease

## 2015-02-08 VITALS — BP 145/85 | HR 61 | Ht <= 58 in | Wt 142.6 lb

## 2015-02-08 DIAGNOSIS — I2510 Atherosclerotic heart disease of native coronary artery without angina pectoris: Secondary | ICD-10-CM

## 2015-02-08 DIAGNOSIS — E785 Hyperlipidemia, unspecified: Secondary | ICD-10-CM

## 2015-02-08 DIAGNOSIS — I5032 Chronic diastolic (congestive) heart failure: Secondary | ICD-10-CM

## 2015-02-08 LAB — HEPATIC FUNCTION PANEL
ALT: 9 U/L (ref 0–35)
AST: 20 U/L (ref 0–37)
Albumin: 3.8 g/dL (ref 3.5–5.2)
Alkaline Phosphatase: 42 U/L (ref 39–117)
Bilirubin, Direct: 0.1 mg/dL (ref 0.0–0.3)
Total Bilirubin: 0.5 mg/dL (ref 0.2–1.2)
Total Protein: 6.9 g/dL (ref 6.0–8.3)

## 2015-02-08 LAB — LIPID PANEL
Cholesterol: 197 mg/dL (ref 0–200)
HDL: 48.6 mg/dL (ref >39.00)
LDL Cholesterol: 132 mg/dL — ABNORMAL HIGH (ref 0–99)
NonHDL: 148.4
Total CHOL/HDL Ratio: 4
Triglycerides: 81 mg/dL (ref 0.0–149.0)
VLDL: 16.2 mg/dL (ref 0.0–40.0)

## 2015-02-08 LAB — BASIC METABOLIC PANEL
BUN: 15 mg/dL (ref 6–23)
CO2: 33 mEq/L — ABNORMAL HIGH (ref 19–32)
Calcium: 9.3 mg/dL (ref 8.4–10.5)
Chloride: 97 mEq/L (ref 96–112)
Creatinine, Ser: 0.72 mg/dL (ref 0.40–1.20)
GFR: 81.8 mL/min (ref >60.00)
Glucose, Bld: 101 mg/dL — ABNORMAL HIGH (ref 70–99)
POTASSIUM: 3.9 meq/L (ref 3.5–5.1)
Sodium: 132 mEq/L — ABNORMAL LOW (ref 135–145)
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
## 2015-02-08 NOTE — Patient Instructions (Signed)

## 2015-02-11 ENCOUNTER — Encounter: Payer: Self-pay | Admitting: Gynecology

## 2015-03-27 ENCOUNTER — Other Ambulatory Visit: Payer: Self-pay | Admitting: Physician Assistant

## 2015-05-11 ENCOUNTER — Other Ambulatory Visit: Payer: Self-pay | Admitting: Physician Assistant

## 2015-06-10 ENCOUNTER — Other Ambulatory Visit: Payer: Self-pay | Admitting: Physician Assistant

## 2015-08-16 ENCOUNTER — Ambulatory Visit: Payer: Medicare Other | Admitting: Cardiovascular Disease

## 2015-08-16 ENCOUNTER — Other Ambulatory Visit: Payer: Medicare Other

## 2015-08-19 ENCOUNTER — Other Ambulatory Visit: Payer: Medicare Other

## 2015-08-19 ENCOUNTER — Encounter: Payer: Self-pay | Admitting: Cardiovascular Disease

## 2015-08-19 ENCOUNTER — Encounter: Payer: Self-pay | Admitting: Gynecology

## 2015-08-19 ENCOUNTER — Ambulatory Visit (INDEPENDENT_AMBULATORY_CARE_PROVIDER_SITE_OTHER): Payer: Medicare Other | Admitting: Gynecology

## 2015-08-19 ENCOUNTER — Ambulatory Visit (INDEPENDENT_AMBULATORY_CARE_PROVIDER_SITE_OTHER): Payer: Medicare Other | Admitting: Cardiovascular Disease

## 2015-08-19 VITALS — BP 136/84 | Ht <= 58 in | Wt 142.0 lb

## 2015-08-19 VITALS — BP 122/80 | HR 68 | Ht <= 58 in | Wt 142.0 lb

## 2015-08-19 DIAGNOSIS — I10 Essential (primary) hypertension: Secondary | ICD-10-CM

## 2015-08-19 DIAGNOSIS — Z01419 Encounter for gynecological examination (general) (routine) without abnormal findings: Secondary | ICD-10-CM

## 2015-08-19 DIAGNOSIS — E785 Hyperlipidemia, unspecified: Secondary | ICD-10-CM | POA: Diagnosis not present

## 2015-08-19 DIAGNOSIS — Z23 Encounter for immunization: Secondary | ICD-10-CM | POA: Diagnosis not present

## 2015-08-19 DIAGNOSIS — M8580 Other specified disorders of bone density and structure, unspecified site: Secondary | ICD-10-CM

## 2015-08-19 DIAGNOSIS — I2510 Atherosclerotic heart disease of native coronary artery without angina pectoris: Secondary | ICD-10-CM | POA: Diagnosis not present

## 2015-08-19 DIAGNOSIS — Z8639 Personal history of other endocrine, nutritional and metabolic disease: Secondary | ICD-10-CM | POA: Diagnosis not present

## 2015-08-19 LAB — LIPID PANEL
CHOL/HDL RATIO: 4
Cholesterol: 179 mg/dL (ref 0–200)
HDL: 45.8 mg/dL (ref >39.00)
LDL CALC: 115 mg/dL — AB (ref 0–99)
NONHDL: 133.32
Triglycerides: 91 mg/dL (ref 0.0–149.0)
VLDL: 18.2 mg/dL (ref 0.0–40.0)

## 2015-08-19 LAB — BASIC METABOLIC PANEL
BUN: 12 mg/dL (ref 6–23)
CHLORIDE: 98 meq/L (ref 96–112)
CO2: 32 mEq/L (ref 19–32)
Calcium: 9.8 mg/dL (ref 8.4–10.5)
Creatinine, Ser: 0.64 mg/dL (ref 0.40–1.20)
GFR: 93.59 mL/min (ref >60.00)
Glucose, Bld: 105 mg/dL — ABNORMAL HIGH (ref 70–99)
POTASSIUM: 4.1 meq/L (ref 3.5–5.1)
SODIUM: 136 meq/L (ref 135–145)

## 2015-08-19 LAB — HEPATIC FUNCTION PANEL
ALBUMIN: 3.9 g/dL (ref 3.5–5.2)
ALK PHOS: 40 U/L (ref 39–117)
ALT: 10 U/L (ref 0–35)
AST: 20 U/L (ref 0–37)
BILIRUBIN DIRECT: 0.1 mg/dL (ref 0.0–0.3)
TOTAL PROTEIN: 6.9 g/dL (ref 6.0–8.3)
Total Bilirubin: 0.6 mg/dL (ref 0.2–1.2)
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
## 2015-08-19 NOTE — Patient Instructions (Signed)

## 2015-08-19 NOTE — Patient Instructions (Signed)

## 2015-08-19 NOTE — Progress Notes (Signed)
[name] Case 1928/08/08 [id number]   History:    81 y.o.  for annual gyn exam who is being followed by her primary physician and cardiologist. There are drawing her blood work. Patient has past history of perianal psoriasis was diagnosed in 2014 clobetasol cream and will helped and she stopped taking it. Patient has history of cardiac stents in the past. She also has had history vitamin D deficiency in the past and history of osteopenia. She is long overdue for bone density study the last was in 2012 which indicated that the right femoral neck had a T score -1.3 and no Frax analysis had been done. Patient had been on a drug-free holiday since she had been on Fosamax from the year 2001-2007. She is not taking her calcium and vitamin D. Her last colonoscopy was normal in 2009. Patient had a benign biopsy from the area of the fourchette. Patient requesting flu vaccine today.  Patient's sister had breast cancer at the age of 33  Past medical history,surgical history, family history and social history were all reviewed and documented in the EPIC chart.  Gynecologic History No LMP recorded. Patient is postmenopausal. Contraception: post menopausal status Last Pap: 2012. Results were: normal Last mammogram: 2016. Results were: normal  Obstetric History OB History  Gravida Para Term Preterm AB SAB TAB Ectopic Multiple Living  # Outcome Date GA Lbr Len/2nd Weight Sex Delivery Anes PTL Lv  3 Term     F Vag-Spont  N Y  2 Term     F Vag-Spont  N Y  1 Term     F Vag-Spont  N Y       ROS: A ROS was performed and pertinent positives and negatives are included in the history.  GENERAL: No fevers or chills. HEENT: No change in vision, no earache, sore throat or sinus congestion. NECK: No pain or stiffness. CARDIOVASCULAR: No chest pain or pressure. No palpitations. PULMONARY: No shortness of breath, cough or wheeze. GASTROINTESTINAL: No abdominal pain, nausea, vomiting or  diarrhea, melena or bright red blood per rectum. GENITOURINARY: No urinary frequency, urgency, hesitancy or dysuria. MUSCULOSKELETAL: No joint or muscle pain, no back pain, no recent trauma. DERMATOLOGIC: No rash, no itching, no lesions. ENDOCRINE: No polyuria, polydipsia, no heat or cold intolerance. No recent change in weight. HEMATOLOGICAL: No anemia or easy bruising or bleeding. NEUROLOGIC: No headache, seizures, numbness, tingling or weakness. PSYCHIATRIC: No depression, no loss of interest in normal activity or change in sleep pattern.     Exam: chaperone present  BP 136/84 mmHg  Ht  (1.473 m)  Wt 142 lb (64.411 kg)  BMI 29.69 kg/m2  Body mass index is 29.69 kg/(m^2).  General appearance : Well developed well nourished female. No acute distress HEENT: Eyes: no retinal hemorrhage or exudates,  Neck supple, trachea midline, no carotid bruits, no thyroidmegaly Lungs: Clear to auscultation, no rhonchi or wheezes, or rib retractions  Heart: Regular rate and rhythm, no murmurs or gallops Breast:Examined in sitting and supine position were symmetrical in appearance, no palpable masses or tenderness,  no skin retraction, no nipple inversion, no nipple discharge, no skin discoloration, no axillary or supraclavicular lymphadenopathy Abdomen: no palpable masses or tenderness, no rebound or guarding Extremities: no edema or skin discoloration or tenderness  Pelvic: Severe vaginal dryness at the area the fourchette  Bartholin, Urethra, Skene Glands: Within normal limits  Vagina: No gross lesions or discharge, atrophic changes  Cervix: No gross lesions or discharge  Uterus  anteverted, normal size, shape and consistency, non-tender and mobile  Adnexa  Without masses or tenderness  Anus and perineum  normal   Rectovaginal  normal sphincter tone without palpated masses or tenderness             Hemoccult PCP provides     Assessment/Plan:  81 y.o. female for annual exam was  instructed to restart the clobetasol cream to apply external genitalia Monday Wednesdays and Friday as a result of her psoriasis/dermatitis. She was given a requisition to schedule her overdue bone density study. Because her past history vitamin D deficiency in her osteopenia were going to check a vitamin D level today. Pap smear not indicated. Her PCP we'll be doing her blood work. She received the flu vaccine today. We discussed importance of calcium vitamin D and weightbearing exercises daily.   Ok Edwards MD, 12:52 PM 08/19/2015

## 2015-08-20 ENCOUNTER — Telehealth: Payer: Self-pay | Admitting: Nurse Practitioner

## 2015-08-20 DIAGNOSIS — E785 Hyperlipidemia, unspecified: Secondary | ICD-10-CM

## 2015-08-20 LAB — VITAMIN D 25 HYDROXY (VIT D DEFICIENCY, FRACTURES): VIT D 25 HYDROXY: 28 ng/mL — AB (ref 30–100)

## 2015-08-20 MED ORDER — EZETIMIBE 10 MG PO TABS: 10.0000 mg | ORAL_TABLET | Freq: Every day | ORAL | Status: DC

## 2015-08-20 NOTE — Telephone Encounter (Signed)
Results and plan of care reviewed with patient's daughter, [name], per patient request.  She states patient is in agreement to start Zetia 10 mg daily and a 3 month lab appointment has been scheduled for 11/26/15.

## 2015-08-20 NOTE — Telephone Encounter (Signed)
-----   Message from Vesta Mixer, MD sent at 08/19/2015  3:38 PM EDT ----- Labs look better.  Cholesterol is still elevated.  Start Zetia 10 mg a day  Recheck labs in 3 months

## 2015-08-21 ENCOUNTER — Telehealth: Payer: Self-pay | Admitting: Cardiovascular Disease

## 2015-08-21 DIAGNOSIS — E785 Hyperlipidemia, unspecified: Secondary | ICD-10-CM

## 2015-08-21 DIAGNOSIS — I2510 Atherosclerotic heart disease of native coronary artery without angina pectoris: Secondary | ICD-10-CM

## 2015-08-21 DIAGNOSIS — I10 Essential (primary) hypertension: Secondary | ICD-10-CM

## 2015-08-21 NOTE — Addendum Note (Signed)
Addended by: Shela Nevin D on: 08/21/2015 01:28 PM   Modules accepted: Orders

## 2015-08-21 NOTE — Telephone Encounter (Signed)
Pt's daughter Storm Frisk called to let Dr Elease Hashimoto know that her mother is not going to take the medication prescribed "Zetia 10 mg" because  This medication is too expensive. The cost is $ 261.00 a month. Pt would like to see if MD can  prescribed  anther medication that she can afford.

## 2015-08-21 NOTE — Telephone Encounter (Signed)
Notified daughter and patient that Dr. Elease Hashimoto has referred her to the [facility] Clinic. She will be hearing from them this week to set up an appointment. [name] was agreeable and verbalized understanding.

## 2015-08-21 NOTE — Telephone Encounter (Signed)
See previous phone note. Pt has elevated lipids and does not tolerate statins. We prescribed Zetia but its too expensive. Lets refer to lipid clinic     Nahser, Deloris Ping, MD  08/21/2015 1:20 PM    [facility] [address]  [address] [place], [place]  [address] Pager [phone] Phone: [phone]; Fax: [phone]   [facility]  [address] [place], [place]  [address] [phone]   Fax [phone]

## 2015-08-21 NOTE — Telephone Encounter (Signed)
New message     Zetia 10 mg Medication cost is $ 261.00 . Was told to call back

## 2015-08-21 NOTE — Telephone Encounter (Signed)
Order placed for AMB referral to Lipid Clinic.

## 2015-08-22 ENCOUNTER — Other Ambulatory Visit: Payer: Self-pay | Admitting: Gynecology

## 2015-08-22 NOTE — Telephone Encounter (Signed)
Left message for patient's daughter to call office to schedule lipid clinic appointment

## 2015-08-23 ENCOUNTER — Other Ambulatory Visit: Payer: Self-pay | Admitting: Gynecology

## 2015-08-23 DIAGNOSIS — E559 Vitamin D deficiency, unspecified: Secondary | ICD-10-CM

## 2015-08-23 MED ORDER — VITAMIN D (ERGOCALCIFEROL) 1.25 MG (50000 UNIT) PO CAPS: 50000.0000 [IU] | ORAL_CAPSULE | ORAL | Status: DC

## 2015-09-02 ENCOUNTER — Ambulatory Visit (INDEPENDENT_AMBULATORY_CARE_PROVIDER_SITE_OTHER): Payer: Medicare Other | Admitting: Pharmacist

## 2015-09-02 DIAGNOSIS — E785 Hyperlipidemia, unspecified: Secondary | ICD-10-CM | POA: Diagnosis not present

## 2015-09-02 MED ORDER — ROSUVASTATIN CALCIUM 5 MG PO TABS: 5.0000 mg | ORAL_TABLET | Freq: Every day | ORAL | Status: DC
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
## 2015-09-02 NOTE — Patient Instructions (Signed)
Start Crestor  by mouth three days of the week.   If you have any problems, please call Kennon Rounds at [phone].

## 2015-09-03 ENCOUNTER — Other Ambulatory Visit: Payer: Self-pay | Admitting: Physician Assistant

## 2015-09-24 ENCOUNTER — Encounter: Payer: Self-pay | Admitting: Gynecology

## 2015-09-26 ENCOUNTER — Telehealth: Payer: Self-pay

## 2015-09-26 NOTE — Telephone Encounter (Signed)
[name] called stating we had called her mom and left message for her to call. She suggested it was probably about her bone density results.  She said her mom is at work all the time and will not be able to talk to me.   Upon review of her chart note in 2014 says patient denies DPR access and we cannot talk with anyone but her and also denies us leaving a detailed message on recorder at home.  I called pateint and left detailed message explaining why I cannot talk with her daughter and suggested that next time she is in she can change that if she so desires.

## 2015-10-17 ENCOUNTER — Other Ambulatory Visit: Payer: Self-pay | Admitting: Cardiovascular Disease

## 2015-11-14 ENCOUNTER — Other Ambulatory Visit: Payer: Self-pay | Admitting: Gynecology

## 2015-11-14 ENCOUNTER — Other Ambulatory Visit: Payer: Self-pay | Admitting: Cardiovascular Disease

## 2015-11-26 ENCOUNTER — Other Ambulatory Visit (INDEPENDENT_AMBULATORY_CARE_PROVIDER_SITE_OTHER): Payer: Medicare Other

## 2015-11-26 DIAGNOSIS — E785 Hyperlipidemia, unspecified: Secondary | ICD-10-CM | POA: Diagnosis not present

## 2015-11-26 NOTE — Addendum Note (Signed)
Addended by: Kelleen Stolze K on: 11/26/2015 07:50 AM   Modules accepted: Orders  

## 2015-11-26 NOTE — Addendum Note (Signed)
Addended by: Tonita PhoenixBOWDEN, Daviyon Widmayer K on: 11/26/2015 07:50 AM   Modules accepted: Orders

## 2015-11-29 ENCOUNTER — Other Ambulatory Visit (INDEPENDENT_AMBULATORY_CARE_PROVIDER_SITE_OTHER): Payer: Medicare Other | Admitting: *Deleted

## 2015-11-29 ENCOUNTER — Telehealth: Payer: Self-pay | Admitting: Nurse Practitioner

## 2015-11-29 DIAGNOSIS — E785 Hyperlipidemia, unspecified: Secondary | ICD-10-CM

## 2015-11-29 LAB — HEPATIC FUNCTION PANEL
ALBUMIN: 4 g/dL (ref 3.6–5.1)
ALK PHOS: 42 U/L (ref 33–130)
ALT: 9 U/L (ref 6–29)
AST: 21 U/L (ref 10–35)
BILIRUBIN TOTAL: 0.8 mg/dL (ref 0.2–1.2)
Bilirubin, Direct: 0.2 mg/dL (ref <=0.2)
Indirect Bilirubin: 0.6 mg/dL (ref 0.2–1.2)
Total Protein: 7 g/dL (ref 6.1–8.1)

## 2015-11-29 LAB — LIPID PANEL
CHOL/HDL RATIO: 4 ratio (ref <=5.0)
Cholesterol: 191 mg/dL (ref 125–200)
HDL: 48 mg/dL (ref >=46)
LDL CALC: 129 mg/dL (ref <130)
Triglycerides: 70 mg/dL (ref <150)
VLDL: 14 mg/dL (ref <30)

## 2015-11-29 LAB — BASIC METABOLIC PANEL
BUN: 13 mg/dL (ref 7–25)
CHLORIDE: 97 mmol/L — AB (ref 98–110)
CO2: 30 mmol/L (ref 20–31)
CREATININE: 0.75 mg/dL (ref 0.60–0.88)
Calcium: 9.6 mg/dL (ref 8.6–10.4)
Glucose, Bld: 95 mg/dL (ref 65–99)
Potassium: 3.7 mmol/L (ref 3.5–5.3)
Sodium: 136 mmol/L (ref 135–146)

## 2015-11-29 MED ORDER — ROSUVASTATIN CALCIUM 5 MG PO TABS: 5.0000 mg | ORAL_TABLET | Freq: Every day | ORAL | Status: DC

## 2015-11-29 NOTE — Telephone Encounter (Signed)
-----   Message from Vesta MixerPhilip J Nahser, MD sent at 11/29/2015  4:31 PM EST ----- BMP . Lipids, liver enz look stable

## 2015-11-29 NOTE — Telephone Encounter (Signed)
I note that she is willing to take the crestor only  3 times a week.

## 2015-11-29 NOTE — Telephone Encounter (Signed)
Reviewed lab results with patient who verbalized understanding.  She states she stopped taking Crestor when her Rx ran out.  She was very argumentative about necessity of statin.  I reviewed the importance of taking a cholesterol reduction medication with her hx of CAD and she states she will only continue Crestor 3 times per week.  I advised her that I will send refill and to please continue medication as soon as she is able to pick up the medication.  She verbalized understanding and agreement.

## 2015-11-29 NOTE — Addendum Note (Signed)
Addended by: Tonita PhoenixBOWDEN, Cianni Manny K on: 11/29/2015 10:45 AM   Modules accepted: Orders

## 2015-12-22 ENCOUNTER — Other Ambulatory Visit: Payer: Self-pay | Admitting: Physician Assistant

## 2016-02-21 ENCOUNTER — Other Ambulatory Visit: Payer: Medicare Other

## 2016-02-21 ENCOUNTER — Ambulatory Visit: Payer: Medicare Other | Admitting: Cardiovascular Disease

## 2016-02-25 ENCOUNTER — Ambulatory Visit (INDEPENDENT_AMBULATORY_CARE_PROVIDER_SITE_OTHER): Payer: Medicare Other | Admitting: Cardiovascular Disease

## 2016-02-25 ENCOUNTER — Other Ambulatory Visit (INDEPENDENT_AMBULATORY_CARE_PROVIDER_SITE_OTHER): Payer: Medicare Other | Admitting: *Deleted

## 2016-02-25 ENCOUNTER — Encounter: Payer: Self-pay | Admitting: Cardiovascular Disease

## 2016-02-25 VITALS — BP 132/82 | HR 62 | Ht <= 58 in | Wt 137.2 lb

## 2016-02-25 DIAGNOSIS — E785 Hyperlipidemia, unspecified: Secondary | ICD-10-CM

## 2016-02-25 DIAGNOSIS — I10 Essential (primary) hypertension: Secondary | ICD-10-CM

## 2016-02-25 DIAGNOSIS — I2510 Atherosclerotic heart disease of native coronary artery without angina pectoris: Secondary | ICD-10-CM | POA: Diagnosis not present

## 2016-02-25 LAB — LIPID PANEL
Cholesterol: 177 mg/dL (ref 125–200)
HDL: 45 mg/dL — AB (ref >=46)
LDL Cholesterol: 117 mg/dL (ref <130)
Total CHOL/HDL Ratio: 3.9 Ratio (ref <=5.0)
Triglycerides: 77 mg/dL (ref <150)
VLDL: 15 mg/dL (ref <30)

## 2016-02-25 NOTE — Patient Instructions (Signed)
Medication Instructions:  Your physician recommends that you continue on your current medications as directed. Please refer to the Current Medication list given to you today.   Labwork: Our office will call you with the results of your blood work done today   Testing/Procedures: None Ordered   Follow-Up: Your physician wants you to follow-up in: 6 months with Dr. Elease HashimotoNahser.  You will receive a reminder letter in the mail two months in advance. If you don't receive a letter, please call our office to schedule the follow-up appointment.   If you need a refill on your cardiac medications before your next appointment, please call your pharmacy.   Thank you for choosing [facility]! Eligha BridegroomMichelle Swinyer, RN [phone]

## 2016-02-25 NOTE — Addendum Note (Signed)
Addended by: Tonita PhoenixBOWDEN, ROBIN K on: 02/25/2016 07:35 AM   Modules accepted: Orders

## 2016-02-25 NOTE — Addendum Note (Signed)
Addended by: Coleen Cardiff K on: 02/25/2016 07:35 AM   Modules accepted: Orders  
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
## 2016-03-09 ENCOUNTER — Other Ambulatory Visit: Payer: Self-pay | Admitting: Cardiovascular Disease

## 2016-03-10 ENCOUNTER — Encounter: Payer: Self-pay | Admitting: Gynecology

## 2016-04-07 ENCOUNTER — Other Ambulatory Visit: Payer: Self-pay | Admitting: Physician Assistant

## 2016-04-18 ENCOUNTER — Other Ambulatory Visit: Payer: Self-pay | Admitting: Cardiovascular Disease

## 2016-06-23 ENCOUNTER — Encounter: Payer: Self-pay | Admitting: Gastroenterology

## 2016-08-31 ENCOUNTER — Encounter: Payer: Self-pay | Admitting: Cardiovascular Disease

## 2016-09-11 ENCOUNTER — Encounter (INDEPENDENT_AMBULATORY_CARE_PROVIDER_SITE_OTHER): Payer: Self-pay

## 2016-09-11 ENCOUNTER — Encounter: Payer: Self-pay | Admitting: Cardiovascular Disease

## 2016-09-11 ENCOUNTER — Encounter: Payer: Medicare Other | Admitting: Gynecology

## 2016-09-11 ENCOUNTER — Ambulatory Visit (INDEPENDENT_AMBULATORY_CARE_PROVIDER_SITE_OTHER): Payer: Medicare Other | Admitting: Cardiovascular Disease

## 2016-09-11 VITALS — BP 144/78 | HR 61 | Ht <= 58 in | Wt 136.0 lb

## 2016-09-11 DIAGNOSIS — I2510 Atherosclerotic heart disease of native coronary artery without angina pectoris: Secondary | ICD-10-CM

## 2016-09-11 DIAGNOSIS — I10 Essential (primary) hypertension: Secondary | ICD-10-CM | POA: Diagnosis not present

## 2016-09-11 MED ORDER — HYDROCHLOROTHIAZIDE 25 MG PO TABS: 12.5000 mg | ORAL_TABLET | Freq: Every day | ORAL | 3 refills | Status: DC

## 2016-09-11 MED ORDER — ISOSORBIDE MONONITRATE ER 60 MG PO TB24: 60.0000 mg | ORAL_TABLET | Freq: Every day | ORAL | 3 refills | Status: DC

## 2016-09-11 MED ORDER — CLOPIDOGREL BISULFATE 75 MG PO TABS: 75.0000 mg | ORAL_TABLET | Freq: Every day | ORAL | 3 refills | Status: DC

## 2016-09-11 MED ORDER — ATENOLOL 50 MG PO TABS: 50.0000 mg | ORAL_TABLET | Freq: Every day | ORAL | 3 refills | Status: DC

## 2016-09-11 MED ORDER — VALSARTAN 160 MG PO TABS: 160.0000 mg | ORAL_TABLET | Freq: Every day | ORAL | 3 refills | Status: DC

## 2016-09-11 NOTE — Patient Instructions (Signed)
Medication Instructions:  Your physician recommends that you continue on your current medications as directed. Please refer to the Current Medication list given to you today.   Labwork: None Ordered   Testing/Procedures: None Ordered   Follow-Up: Your physician wants you to follow-up in: 6 months with Dr. Nahser.  You will receive a reminder letter in the mail two months in advance. If you don't receive a letter, please call our office to schedule the follow-up appointment.   If you need a refill on your cardiac medications before your next appointment, please call your pharmacy.   Thank you for choosing [facility]! Ruvi Fullenwider, RN [phone]    
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
## 2016-09-18 ENCOUNTER — Encounter: Payer: Self-pay | Admitting: Gynecology

## 2016-09-18 ENCOUNTER — Ambulatory Visit (INDEPENDENT_AMBULATORY_CARE_PROVIDER_SITE_OTHER): Payer: Medicare Other | Admitting: Gynecology

## 2016-09-18 VITALS — BP 130/78 | Ht <= 58 in | Wt 132.0 lb

## 2016-09-18 DIAGNOSIS — Z01411 Encounter for gynecological examination (general) (routine) with abnormal findings: Secondary | ICD-10-CM | POA: Diagnosis not present

## 2016-09-18 DIAGNOSIS — M8580 Other specified disorders of bone density and structure, unspecified site: Secondary | ICD-10-CM | POA: Diagnosis not present

## 2016-09-18 NOTE — Progress Notes (Signed)
[name] 08/21/1928 [id number]   History:    82 y.o.  for annual gyn exam with no complaints today. Her PCP has been doing her blood work and her vaccines are up-to-date.Patient has past history of perianal psoriasis was diagnosed in 2014 clobetasol cream and will helped and she stopped taking it. Patient has history of cardiac stents in the past. She also has had history vitamin D deficiency in the past and history of osteopenia. Her bone density study in 2016 demonstrated the lowest T score was at the right femoral neck with a value of -1.3. Her Frax analysis demonstrated her 10 year fracture risk of the hip is 3.8% exceeding threshold overall fracture risk 17% below threshold. When compared with previous study of 2012 there was statistically significant improvement of the right femoral neck and the AP spine and slight decrease in the left femoral neck.Patient had been on a drug-free holiday since she had been on Fosamax from the year 2001-2007. She is not taking her calcium and vitamin D. Her last colonoscopy was normal in 2009. Patient had a benign biopsy from the area of the fourchette. Patient requesting flu vaccine today.  Patient's sister had breast cancer at the age of 80   Past medical history,surgical history, family history and social history were all reviewed and documented in the EPIC chart.  Gynecologic History No LMP recorded. Patient is postmenopausal. Contraception: post menopausal status Last Pap: 2012. Results were: normal Last mammogram: 2017. Results were: normal  Obstetric History OB History  Gravida Para Term Preterm AB Living  3 3 3     3   SAB TAB Ectopic Multiple Live Births          3    # Outcome Date GA Lbr Len/2nd Weight Sex Delivery Anes PTL Lv  3 Term     F Vag-Spont  N LIV  2 Term     F Vag-Spont  N LIV  1 Term     F Vag-Spont  N LIV       ROS: A ROS was performed and pertinent positives and negatives are included in the history.  GENERAL: No fevers or chills. HEENT: No change in vision, no earache, sore throat or sinus congestion. NECK: No pain or stiffness. CARDIOVASCULAR: No chest pain or pressure. No palpitations. PULMONARY: No shortness of breath, cough or wheeze. GASTROINTESTINAL: No abdominal pain, nausea, vomiting or diarrhea, melena or bright red blood per rectum. GENITOURINARY: No urinary frequency, urgency, hesitancy or dysuria. MUSCULOSKELETAL: No joint or muscle pain, no back pain, no recent trauma. DERMATOLOGIC: No rash, no itching, no lesions. ENDOCRINE: No polyuria, polydipsia, no heat or cold intolerance. No recent change in weight. HEMATOLOGICAL: No anemia or easy bruising or bleeding. NEUROLOGIC: No headache, seizures, numbness, tingling or weakness. PSYCHIATRIC: No depression, no loss of interest in normal activity or change in sleep pattern.     Exam: chaperone present  BP 130/78   Ht 4\' 10"  (1.473 m)   Wt 132 lb (59.9 kg)   BMI 27.59 kg/m   Body mass index is 27.59 kg/m.  General appearance : Well developed well nourished female. No acute distress HEENT: Eyes: no retinal hemorrhage or exudates,  Neck supple, trachea midline, no carotid bruits, no thyroidmegaly Lungs: Clear to auscultation, no rhonchi or wheezes, or rib retractions  Heart: Regular rate and rhythm, no murmurs or gallops Breast:Examined in sitting and supine position were symmetrical in appearance, no palpable masses or tenderness,  no skin retraction,  no nipple inversion, no nipple discharge, no skin discoloration, no axillary or supraclavicular lymphadenopathy Abdomen: no palpable masses or tenderness, no rebound or guarding Extremities: no edema or skin discoloration or tenderness  Pelvic:  Bartholin, Urethra, Skene Glands: Within normal limits             Vagina: No gross lesions or discharge, atrophic changes  Cervix: No gross lesions or discharge  Uterus  anteverted, normal size, shape and consistency, non-tender and  mobile  Adnexa  Without masses or tenderness  Anus and perineum  normal   Rectovaginal  normal sphincter tone without palpated masses or tenderness             Hemoccult PCP provides     Assessment/Plan:  82 y.o. female for annual exam is being followed by her PCP Dr. Yetta Barre was been doing her labs and flu vaccine. Patient with dry the area of the fourchette I've recommended she begin using her clobetasol that was previously prescribed at least once a week. Pap smear not indicated. Osteopenia on drug holiday stable bone although high risk for fracture based on age I will wait for 2 more years before reinstituting any antiresorptive agent if there is further bone loss   Ok Edwards MD, 12:22 PM 09/18/2016

## 2016-09-28 ENCOUNTER — Telehealth: Payer: Self-pay | Admitting: Cardiovascular Disease

## 2016-09-28 NOTE — Telephone Encounter (Signed)
New Message  Pts [facility] or clarity on medications sent to them.  Pts daugher voiced they received a letter stating they weren't able to fill prescriptions that was sent due to our office not replying via phone or fax.  Please f/u with pt

## 2016-09-29 MED ORDER — ATENOLOL 25 MG PO TABS: 50.0000 mg | ORAL_TABLET | Freq: Every day | ORAL | 3 refills | Status: DC

## 2016-09-29 NOTE — Telephone Encounter (Signed)
Spoke with patient's daughter, Chales AbrahamsMary Ann, who states patient received a letter from [facility] that they needed to speak with Dr. Elease HashimotoNahser prior to shipping her medications.  I spoke with Charlie PitterMelanie, RPh at [facility] and she advised atenolol 50 mg tabs are not available but they do have 25 mg tabs.  Rx was changed to indicate patient take 2 25 mg tabs daily.  I thanked Shawna OrleansMelanie for her help.

## 2017-01-15 ENCOUNTER — Other Ambulatory Visit: Payer: Self-pay | Admitting: *Deleted

## 2017-01-15 MED ORDER — NITROGLYCERIN 0.4 MG SL SUBL: 0.4000 mg | SUBLINGUAL_TABLET | SUBLINGUAL | 3 refills | Status: DC | PRN

## 2017-02-02 ENCOUNTER — Encounter: Payer: Self-pay | Admitting: Cardiovascular Disease

## 2017-02-08 ENCOUNTER — Encounter: Payer: Self-pay | Admitting: Cardiovascular Disease

## 2017-02-08 ENCOUNTER — Ambulatory Visit (INDEPENDENT_AMBULATORY_CARE_PROVIDER_SITE_OTHER): Payer: Medicare Other | Admitting: Cardiovascular Disease

## 2017-02-08 VITALS — BP 136/64 | HR 73 | Ht <= 58 in | Wt 137.1 lb

## 2017-02-08 DIAGNOSIS — E782 Mixed hyperlipidemia: Secondary | ICD-10-CM

## 2017-02-08 DIAGNOSIS — I2510 Atherosclerotic heart disease of native coronary artery without angina pectoris: Secondary | ICD-10-CM | POA: Diagnosis not present

## 2017-02-08 NOTE — Patient Instructions (Signed)
Medication Instructions:  Your physician recommends that you continue on your current medications as directed. Please refer to the Current Medication list given to you today.   Labwork: None Ordered   Testing/Procedures: None Ordered   Follow-Up: Your physician wants you to follow-up in: 6 months with Dr. Nahser.  You will receive a reminder letter in the mail two months in advance. If you don't receive a letter, please call our office to schedule the follow-up appointment.   If you need a refill on your cardiac medications before your next appointment, please call your pharmacy.   Thank you for choosing [facility]! Alauna Hayden, RN [phone]    
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
## 2017-02-26 ENCOUNTER — Encounter: Payer: Self-pay | Admitting: Gynecology

## 2017-03-18 ENCOUNTER — Telehealth: Payer: Self-pay | Admitting: Cardiovascular Disease

## 2017-03-18 NOTE — Telephone Encounter (Signed)
New message    Pt daughter is calling stating pt takes bp meds at bed time.    Pt c/o BP issue: STAT if pt c/o blurred vision, one-sided weakness or slurred speech  1. What are your last 5 BP readings? 99/54  2. Are you having any other symptoms (ex. Dizziness, headache, blurred vision, passed out)? No. She said she was dizzy once.  3. What is your BP issue? Pt takes bp meds at night and checks bp in the morning. Per pt daughter, her bp is low in the morning.

## 2017-03-19 MED ORDER — VALSARTAN 80 MG PO TABS: 80.0000 mg | ORAL_TABLET | Freq: Every day | ORAL | 3 refills | Status: DC

## 2017-03-19 NOTE — Telephone Encounter (Signed)
Left message for patient's daughter to call back. 

## 2017-03-19 NOTE — Telephone Encounter (Signed)
Spoke with patient and daughter who state patient's BP has been low with readings of 90's mmHg systolic and 50's mmHg diastolic for at least the last week. Patient states she got dizzy on one occasion. She reports that she takes her medication at night except for one (she cannot recall the name of the one she takes in the morning). I advised that she could take medication earlier in the day so that she doesn't wake up with low BP. I advised that per Dr. Elease Hashimoto, who is in the office today, she should decrease Valsartan to 80 mg daily. She states she will monitor BP and decide when to take her medication based on readings and how she is feeling. I advised her to call back with questions or concerns as she makes medication adjustments. Daughter and patient thanked me for the call.

## 2017-04-07 ENCOUNTER — Encounter: Payer: Self-pay | Admitting: Gynecology

## 2017-06-23 ENCOUNTER — Telehealth: Payer: Self-pay | Admitting: Pharmacist

## 2017-06-23 MED ORDER — LOSARTAN POTASSIUM 50 MG PO TABS: 50.0000 mg | ORAL_TABLET | Freq: Every day | ORAL | 3 refills | Status: DC

## 2017-06-23 NOTE — Telephone Encounter (Signed)
Spoke with patient daughter who states pt valsartan has been recalled. She requests alt be [facility] and she will continue taking valsartan until losartan arrives (given indication). She is aware to monitor pt pressure and call with any concerns/changes.

## 2017-07-05 ENCOUNTER — Telehealth: Payer: Self-pay | Admitting: Cardiovascular Disease

## 2017-07-05 MED ORDER — LOSARTAN POTASSIUM 100 MG PO TABS: 100.0000 mg | ORAL_TABLET | Freq: Every day | ORAL | 3 refills | Status: DC

## 2017-07-05 NOTE — Telephone Encounter (Addendum)
Spoke w/ dtr & pt (called pt at work). Reports "dizziness/swimmy-headed" since starting that new medication.  The one that replaced her Valsartan" Pt has not taken Losartan for the past 2 days. She has been having to use a cane, to steady herself, secondary to dizziness.  (she does not normally use a cane) Reports SBPs 170s + Pt was taking Valsartan 160, not 80 as instructed in April. Pt aware I will review with pharmacist and call her dtr back with recommendation/s.

## 2017-07-05 NOTE — Telephone Encounter (Signed)
New message   Pt daughter calling and stated that a medicine that was temporarily prescribed to her mother did not work and she wants to get another medication that will not make her mother feel "drunk". I asked the name of the medication and she does not know,. Requests a call back.

## 2017-07-05 NOTE — Telephone Encounter (Signed)
Reviewed with pharmacist, Raquel. Advised dtr to have pt increase Losartan to 100 mg daily. Discussed dizziness possibly r/t high BPs. Continue to monitor BPs and dizziness.   Call office if BP and/or dizziness does not improve with new medication dosing. Dtr verbalized understanding and is agreeable to plan.

## 2017-07-07 NOTE — Telephone Encounter (Signed)
Agree with note by Ilene Quahelle Swinyer, RN. Will see if her symptoms improve with Losartan

## 2017-07-07 NOTE — Telephone Encounter (Signed)
Routing to Dr. Elease HashimotoNahser for review

## 2017-08-16 ENCOUNTER — Other Ambulatory Visit: Payer: Self-pay | Admitting: *Deleted

## 2017-08-16 MED ORDER — LOSARTAN POTASSIUM 100 MG PO TABS: 100.0000 mg | ORAL_TABLET | Freq: Every day | ORAL | 1 refills | Status: DC

## 2017-09-15 ENCOUNTER — Other Ambulatory Visit: Payer: Self-pay | Admitting: Cardiovascular Disease

## 2017-10-01 ENCOUNTER — Ambulatory Visit: Payer: Medicare Other | Admitting: Gynecology

## 2017-10-01 ENCOUNTER — Ambulatory Visit: Payer: Medicare Other | Admitting: Cardiovascular Disease

## 2017-10-01 ENCOUNTER — Encounter: Payer: Self-pay | Admitting: Gynecology

## 2017-10-01 ENCOUNTER — Encounter (INDEPENDENT_AMBULATORY_CARE_PROVIDER_SITE_OTHER): Payer: Self-pay

## 2017-10-01 ENCOUNTER — Encounter: Payer: Self-pay | Admitting: Cardiovascular Disease

## 2017-10-01 VITALS — BP 156/68 | HR 66 | Ht <= 58 in | Wt 134.4 lb

## 2017-10-01 VITALS — BP 124/78 | Ht <= 58 in | Wt 134.0 lb

## 2017-10-01 DIAGNOSIS — E782 Mixed hyperlipidemia: Secondary | ICD-10-CM

## 2017-10-01 DIAGNOSIS — I2510 Atherosclerotic heart disease of native coronary artery without angina pectoris: Secondary | ICD-10-CM | POA: Diagnosis not present

## 2017-10-01 DIAGNOSIS — I10 Essential (primary) hypertension: Secondary | ICD-10-CM

## 2017-10-01 DIAGNOSIS — N952 Postmenopausal atrophic vaginitis: Secondary | ICD-10-CM | POA: Diagnosis not present

## 2017-10-01 DIAGNOSIS — M8580 Other specified disorders of bone density and structure, unspecified site: Secondary | ICD-10-CM

## 2017-10-01 DIAGNOSIS — Z01411 Encounter for gynecological examination (general) (routine) with abnormal findings: Secondary | ICD-10-CM

## 2017-10-01 LAB — BASIC METABOLIC PANEL
BUN/Creatinine Ratio: 20 (ref 12–28)
BUN: 15 mg/dL (ref 8–27)
CALCIUM: 9.5 mg/dL (ref 8.7–10.3)
CO2: 28 mmol/L (ref 20–29)
Chloride: 94 mmol/L — ABNORMAL LOW (ref 96–106)
Creatinine, Ser: 0.74 mg/dL (ref 0.57–1.00)
GFR, EST AFRICAN AMERICAN: 84 mL/min/{1.73_m2} (ref >59)
GFR, EST NON AFRICAN AMERICAN: 73 mL/min/{1.73_m2} (ref >59)
Glucose: 104 mg/dL — ABNORMAL HIGH (ref 65–99)
Potassium: 4.3 mmol/L (ref 3.5–5.2)
Sodium: 134 mmol/L (ref 134–144)

## 2017-10-01 MED ORDER — HYDROCHLOROTHIAZIDE 12.5 MG PO CAPS: 12.5000 mg | ORAL_CAPSULE | Freq: Every day | ORAL | 3 refills | Status: DC

## 2017-10-01 MED ORDER — LOSARTAN POTASSIUM 100 MG PO TABS: 100.0000 mg | ORAL_TABLET | Freq: Every day | ORAL | 3 refills | Status: DC

## 2017-10-01 MED ORDER — NITROGLYCERIN 0.4 MG SL SUBL: 0.4000 mg | SUBLINGUAL_TABLET | SUBLINGUAL | 6 refills | Status: DC | PRN

## 2017-10-01 MED ORDER — CLOPIDOGREL BISULFATE 75 MG PO TABS: 75.0000 mg | ORAL_TABLET | Freq: Every day | ORAL | 3 refills | Status: DC

## 2017-10-01 MED ORDER — ATENOLOL 25 MG PO TABS: 50.0000 mg | ORAL_TABLET | Freq: Every day | ORAL | 3 refills | Status: DC

## 2017-10-01 NOTE — Patient Instructions (Signed)
Medication Instructions:  Your physician recommends that you continue on your current medications as directed. Please refer to the Current Medication list given to you today.   Labwork: TODAY - basic metabolic panel   Testing/Procedures: None Ordered   Follow-Up: Your physician wants you to follow-up in: 6 months with Dr. Nahser. You will receive a reminder letter in the mail two months in advance. If you don't receive a letter, please call our office to schedule the follow-up appointment.   If you need a refill on your cardiac medications before your next appointment, please call your pharmacy.   Thank you for choosing [facility]! Lonney Revak, RN [phone]    

## 2017-10-01 NOTE — Patient Instructions (Signed)
Arrange to have your bone density done this coming year
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
## 2017-10-01 NOTE — Progress Notes (Signed)
    [name] 09/17/1928 [id number]        83 y.o.  [id number] for annual gynecologic exam.  Former patient of Dr. Lily [name].  Past medical history,surgical history, problem list, medications, allergies, family history and social history were all reviewed and documented as reviewed in the EPIC chart.  ROS:  Performed with pertinent positives and negatives included in the history, assessment and plan.   Additional significant findings : None   Exam: [name] assistant Vitals:   10/01/17 1431  BP: 124/78  Weight: 134 lb (60.8 kg)  Height: 4\' 9"  (1.448 m)   Body mass index is 29 kg/m.  General appearance:  Normal affect, orientation and appearance. Skin: Grossly normal HEENT: Without gross lesions.  No cervical or supraclavicular adenopathy. Thyroid normal.  Lungs:  Clear without wheezing, rales or rhonchi Cardiac: RR, without RMG Abdominal:  Soft, nontender, without masses, guarding, rebound, organomegaly or hernia Breasts:  Examined lying and sitting without masses, retractions, discharge or axillary adenopathy. Pelvic:  Ext, BUS, Vagina: With atrophic changes  Cervix: With atrophic changes  Uterus: Anteverted, normal size, shape and contour, midline and mobile nontender   Adnexa: Without masses or tenderness    Anus and perineum: Normal   Rectovaginal: Normal sphincter tone without palpated masses or tenderness.    Assessment/Plan:  83 y.o. [id number] female for annual gynecologic exam.   1. Postmenopausal/atrophic genital changes.  History of perianal psoriasis for which she uses clobetasol cream intermittently.  Exam shows atrophic changes but no significant irritative changes.  She is asymptomatic at this time. 2. Osteopenia.  DEXA 2016 T score -1.2 FRAX 17% / 3.8%.  Had previously been on Fosamax 2001 through 2007.  Recommended follow-up DEXA now with consideration for treatment based on hip FRAX elevation.  Patient states that she is arranging for a bone density  through her primary physician's office and will follow up with them in reference to this. 3. Pap smear 2012.  No Pap smear done today.  No history of significant abnormal Pap smears.  Per current screening guidelines we both agree to stop screening. 4. Mammography 02/2017.  Continue with annual mammography when due.  SBE monthly reviewed.  Breast exam normal today. 5. Colonoscopy 2007.  Will follow up with her primary physician in reference to colon screening. 6. Health maintenance.  No routine lab work done as patient does this elsewhere.  Follow-up 1 year, sooner as needed.   [name],[name], 3:06 PM 10/01/2017

## 2018-03-21 ENCOUNTER — Other Ambulatory Visit: Payer: Self-pay | Admitting: Cardiovascular Disease

## 2018-03-23 ENCOUNTER — Telehealth: Payer: Self-pay | Admitting: *Deleted

## 2018-03-23 DIAGNOSIS — M8580 Other specified disorders of bone density and structure, unspecified site: Secondary | ICD-10-CM

## 2018-03-23 HISTORY — DX: Other specified disorders of bone density and structure, unspecified site: M85.80

## 2018-03-23 NOTE — Telephone Encounter (Signed)
I lmptcb to discuss about appt on Apr 06, 2018 @ 9:45 am with Tereso Newcomer, PA. PA will be out of the office at that time on May 15. Per PA we can see pt same day still at 2:15, or if need too we ca change to another day. I lmom apologized for any inconvenience. Lmom to call back so that we may reschedule her appt.

## 2018-03-24 ENCOUNTER — Encounter: Payer: Self-pay | Admitting: *Deleted

## 2018-03-24 NOTE — Telephone Encounter (Signed)
This encounter was created in error - please disregard.

## 2018-03-24 NOTE — Telephone Encounter (Signed)
Left message x 2 to reschedule appt. PA will be out of the office at this appt time. I have held the 2:15 appt time if pt wishes to be seen the same day.

## 2018-04-06 ENCOUNTER — Encounter: Payer: Self-pay | Admitting: Gynecology

## 2018-04-06 ENCOUNTER — Ambulatory Visit: Payer: Medicare Other | Admitting: Physician Assistant

## 2018-04-08 ENCOUNTER — Telehealth: Payer: Self-pay | Admitting: *Deleted

## 2018-04-08 ENCOUNTER — Encounter: Payer: Self-pay | Admitting: Gynecology

## 2018-04-08 NOTE — Telephone Encounter (Signed)
PA out of the office 6/21, need to [facility]

## 2018-04-12 ENCOUNTER — Encounter: Payer: Self-pay | Admitting: Gynecology

## 2018-05-13 ENCOUNTER — Ambulatory Visit: Payer: Medicare Other | Admitting: Physician Assistant

## 2018-05-16 ENCOUNTER — Ambulatory Visit: Payer: Medicare Other | Admitting: Physician Assistant

## 2018-08-05 ENCOUNTER — Encounter: Payer: Self-pay | Admitting: Cardiovascular Disease

## 2018-08-05 ENCOUNTER — Ambulatory Visit: Payer: Medicare Other | Admitting: Cardiovascular Disease

## 2018-08-05 VITALS — BP 142/72 | HR 56 | Ht <= 58 in | Wt 133.0 lb

## 2018-08-05 DIAGNOSIS — I10 Essential (primary) hypertension: Secondary | ICD-10-CM | POA: Diagnosis not present

## 2018-08-05 DIAGNOSIS — J45909 Unspecified asthma, uncomplicated: Secondary | ICD-10-CM | POA: Diagnosis not present

## 2018-08-05 DIAGNOSIS — E782 Mixed hyperlipidemia: Secondary | ICD-10-CM

## 2018-08-05 DIAGNOSIS — I5032 Chronic diastolic (congestive) heart failure: Secondary | ICD-10-CM

## 2018-08-05 MED ORDER — ALBUTEROL SULFATE HFA 108 (90 BASE) MCG/ACT IN AERS: 2.0000 | INHALATION_SPRAY | Freq: Four times a day (QID) | RESPIRATORY_TRACT | 2 refills | Status: DC | PRN

## 2018-08-05 NOTE — Patient Instructions (Signed)
Medication Instructions:  Your physician has recommended you make the following change in your medication:   START: Albuterol HFA Inhaler: Use 2 puffs every 6 hours for the first 5 days, then use 2 puffs every 6 hours AS NEEDED for wheezing   Labwork: Your physician recommends that you return for a FASTING lipid profile, basic metabolic panel, and liver function panel in 6 months   Testing/Procedures: None ordered  Follow-Up: Your physician wants you to follow-up in: 6 months with Dr. Elease HashimotoNahser. You will receive a reminder letter in the mail two months in advance. If you don't receive a letter, please call our office to schedule the follow-up appointment.   Any Other Special Instructions Will Be Listed Below (If Applicable).     If you need a refill on your cardiac medications before your next appointment, please call your pharmacy.
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
## 2018-09-07 ENCOUNTER — Other Ambulatory Visit: Payer: Self-pay | Admitting: Cardiovascular Disease

## 2018-10-07 ENCOUNTER — Encounter: Payer: Self-pay | Admitting: Family Medicine

## 2018-10-07 ENCOUNTER — Ambulatory Visit (INDEPENDENT_AMBULATORY_CARE_PROVIDER_SITE_OTHER): Payer: Medicare Other | Admitting: Family Medicine

## 2018-10-07 VITALS — BP 120/82 | HR 63 | Temp 98.0°F | Wt 137.4 lb

## 2018-10-07 DIAGNOSIS — I10 Essential (primary) hypertension: Secondary | ICD-10-CM

## 2018-10-07 DIAGNOSIS — I2510 Atherosclerotic heart disease of native coronary artery without angina pectoris: Secondary | ICD-10-CM

## 2018-10-07 NOTE — Patient Instructions (Signed)
-  It was very nice to meet you - I would like to see you back in about 6 months.

## 2018-10-07 NOTE — Assessment & Plan Note (Signed)
-  Stable, denies anginal symptoms.  -She will continue to follow with cardiology.
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
## 2018-10-07 NOTE — Assessment & Plan Note (Signed)
-  BP is well controlled, continue current medications.  

## 2019-01-01 ENCOUNTER — Other Ambulatory Visit: Payer: Self-pay | Admitting: Cardiovascular Disease

## 2019-02-08 ENCOUNTER — Telehealth: Payer: Self-pay | Admitting: Nurse Practitioner

## 2019-02-08 NOTE — Telephone Encounter (Signed)
Left message for patient to call back regarding appointment with Dr. Nahser on 3/20. Call placed due to restrictions enacted for Covid 19.   

## 2019-02-13 ENCOUNTER — Ambulatory Visit: Payer: Medicare Other | Admitting: Cardiovascular Disease

## 2019-02-14 ENCOUNTER — Ambulatory Visit: Payer: Medicare Other

## 2019-02-15 ENCOUNTER — Ambulatory Visit: Payer: Medicare Other | Admitting: *Deleted

## 2019-03-07 ENCOUNTER — Telehealth: Payer: Self-pay | Admitting: Cardiovascular Disease

## 2019-03-07 NOTE — Telephone Encounter (Signed)
Spoke with patient's daughter, Corrie Dandy, who wanted to schedule her mother to see Dr. Elease Hashimoto 06/30/2019.  She said that her mother preferred to see him in person.

## 2019-04-12 ENCOUNTER — Telehealth: Payer: Self-pay | Admitting: *Deleted

## 2019-04-12 NOTE — Telephone Encounter (Signed)
Please schedule awv

## 2019-04-21 DIAGNOSIS — Z961 Presence of intraocular lens: Secondary | ICD-10-CM | POA: Diagnosis not present

## 2019-06-30 ENCOUNTER — Ambulatory Visit: Payer: Medicare Other | Admitting: Cardiovascular Disease

## 2019-07-06 ENCOUNTER — Telehealth: Payer: Self-pay

## 2019-07-06 NOTE — Telephone Encounter (Signed)
Attempted to call pt and schedule follow up appt, no answer

## 2019-07-17 ENCOUNTER — Telehealth: Payer: Self-pay | Admitting: Family Medicine

## 2019-07-17 NOTE — Telephone Encounter (Signed)
I called and left message on patient voicemail to call office and schedule 6 month follow up appointment. Patient was due for follow up appointment in May.

## 2019-08-31 ENCOUNTER — Encounter: Payer: Self-pay | Admitting: Gynecology

## 2019-09-22 ENCOUNTER — Other Ambulatory Visit: Payer: Self-pay | Admitting: Cardiovascular Disease

## 2019-10-25 ENCOUNTER — Telehealth: Payer: Self-pay

## 2019-10-25 ENCOUNTER — Other Ambulatory Visit: Payer: Self-pay

## 2019-10-25 NOTE — Telephone Encounter (Signed)
Copied from [place] [id number]. Topic: General - Inquiry >> Oct 24, 2019  5:02 PM Alease Frame wrote: Reason for CRM: Patient is wondering if she can do a virtual appt for Friday instead of coming in the office . Please advise

## 2019-10-26 ENCOUNTER — Encounter: Payer: Medicare Other | Admitting: Family Medicine

## 2019-10-26 ENCOUNTER — Ambulatory Visit: Payer: Medicare Other | Admitting: Cardiovascular Disease

## 2019-10-26 ENCOUNTER — Encounter: Payer: Self-pay | Admitting: Cardiovascular Disease

## 2019-10-26 VITALS — BP 140/70 | HR 58 | Ht <= 58 in | Wt 136.0 lb

## 2019-10-26 DIAGNOSIS — I10 Essential (primary) hypertension: Secondary | ICD-10-CM | POA: Diagnosis not present

## 2019-10-26 DIAGNOSIS — I5032 Chronic diastolic (congestive) heart failure: Secondary | ICD-10-CM

## 2019-10-26 DIAGNOSIS — I2510 Atherosclerotic heart disease of native coronary artery without angina pectoris: Secondary | ICD-10-CM

## 2019-10-26 DIAGNOSIS — E782 Mixed hyperlipidemia: Secondary | ICD-10-CM | POA: Diagnosis not present

## 2019-10-26 NOTE — Patient Instructions (Signed)
Medication Instructions:  Your physician recommends that you continue on your current medications as directed. Please refer to the Current Medication list given to you today.  *If you need a refill on your cardiac medications before your next appointment, please call your pharmacy*  Lab Work: TODAY - basic metabolic panel  If you have labs (blood work) drawn today and your tests are completely normal, you will receive your results only by: Marland Kitchen MyChart Message (if you have MyChart) OR . A paper copy in the mail If you have any lab test that is abnormal or we need to change your treatment, we will call you to review the results.   Testing/Procedures: None Ordered   Follow-Up: At [facility], you and your health needs are our priority.  As part of our continuing mission to provide you with exceptional heart care, we have created designated Provider Care Teams.  These Care Teams include your primary Cardiologist (physician) and Advanced Practice Providers (APPs -  Physician Assistants and Nurse Practitioners) who all work together to provide you with the care you need, when you need it.  Your next appointment:   1 year(s)  The format for your next appointment:   In Person  Provider:   You may see Dr. Acie Fredrickson or one of the following Advanced Practice Providers on your designated Care Team:    Richardson Dopp, PA-C  [place], [place]  Daune Perch, [place]
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
## 2019-10-27 LAB — BASIC METABOLIC PANEL
BUN/Creatinine Ratio: 15 (ref 12–28)
BUN: 12 mg/dL (ref 8–27)
CO2: 29 mmol/L (ref 20–29)
Calcium: 9.3 mg/dL (ref 8.7–10.3)
Chloride: 100 mmol/L (ref 96–106)
Creatinine, Ser: 0.81 mg/dL (ref 0.57–1.00)
GFR calc Af Amer: 74 mL/min/{1.73_m2} (ref >59)
GFR calc non Af Amer: 65 mL/min/{1.73_m2} (ref >59)
Glucose: 89 mg/dL (ref 65–99)
Potassium: 4.1 mmol/L (ref 3.5–5.2)
Sodium: 139 mmol/L (ref 134–144)

## 2020-01-03 ENCOUNTER — Encounter: Payer: Self-pay | Admitting: Nurse Practitioner

## 2020-01-03 ENCOUNTER — Ambulatory Visit (INDEPENDENT_AMBULATORY_CARE_PROVIDER_SITE_OTHER): Payer: Medicare Other

## 2020-01-03 ENCOUNTER — Ambulatory Visit (INDEPENDENT_AMBULATORY_CARE_PROVIDER_SITE_OTHER): Payer: Medicare Other | Admitting: Nurse Practitioner

## 2020-01-03 ENCOUNTER — Other Ambulatory Visit: Payer: Self-pay

## 2020-01-03 VITALS — BP 120/70 | HR 67 | Temp 96.9°F | Ht <= 58 in | Wt 134.6 lb

## 2020-01-03 DIAGNOSIS — R404 Transient alteration of awareness: Secondary | ICD-10-CM | POA: Diagnosis not present

## 2020-01-03 DIAGNOSIS — R410 Disorientation, unspecified: Secondary | ICD-10-CM | POA: Diagnosis not present

## 2020-01-03 DIAGNOSIS — R300 Dysuria: Secondary | ICD-10-CM | POA: Diagnosis not present

## 2020-01-03 DIAGNOSIS — R05 Cough: Secondary | ICD-10-CM | POA: Diagnosis not present

## 2020-01-03 LAB — CBC WITH DIFFERENTIAL/PLATELET
Basophils Absolute: 0.1 10*3/uL (ref 0.0–0.1)
Basophils Relative: 0.7 % (ref 0.0–3.0)
Eosinophils Absolute: 0.2 10*3/uL (ref 0.0–0.7)
Eosinophils Relative: 3.3 % (ref 0.0–5.0)
HCT: 33.2 % — ABNORMAL LOW (ref 36.0–46.0)
Hemoglobin: 11.3 g/dL — ABNORMAL LOW (ref 12.0–15.0)
Lymphocytes Relative: 19.9 % (ref 12.0–46.0)
Lymphs Abs: 1.5 10*3/uL (ref 0.7–4.0)
MCHC: 33.9 g/dL (ref 30.0–36.0)
MCV: 93.6 fl (ref 78.0–100.0)
Monocytes Absolute: 0.6 10*3/uL (ref 0.1–1.0)
Monocytes Relative: 7.5 % (ref 3.0–12.0)
Neutro Abs: 5.1 10*3/uL (ref 1.4–7.7)
Neutrophils Relative %: 68.6 % (ref 43.0–77.0)
Platelets: 169 10*3/uL (ref 150.0–400.0)
RBC: 3.55 Mil/uL — ABNORMAL LOW (ref 3.87–5.11)
RDW: 13.6 % (ref 11.5–15.5)
WBC: 7.4 10*3/uL (ref 4.0–10.5)

## 2020-01-03 LAB — BASIC METABOLIC PANEL
BUN: 24 mg/dL — ABNORMAL HIGH (ref 6–23)
CO2: 32 mEq/L (ref 19–32)
Calcium: 10 mg/dL (ref 8.4–10.5)
Chloride: 101 mEq/L (ref 96–112)
Creatinine, Ser: 1.04 mg/dL (ref 0.40–1.20)
GFR: 49.78 mL/min — ABNORMAL LOW (ref >60.00)
Glucose, Bld: 113 mg/dL — ABNORMAL HIGH (ref 70–99)
Potassium: 3.5 mEq/L (ref 3.5–5.1)
Sodium: 139 mEq/L (ref 135–145)

## 2020-01-03 LAB — POCT URINALYSIS DIPSTICK
Bilirubin, UA: NEGATIVE
Blood, UA: 10
Glucose, UA: NEGATIVE
Ketones, UA: NEGATIVE
Nitrite, UA: NEGATIVE
Protein, UA: NEGATIVE
Spec Grav, UA: 1.015 (ref 1.010–1.025)
Urobilinogen, UA: 1 E.U./dL
pH, UA: 6 (ref 5.0–8.0)

## 2020-01-03 LAB — TSH: TSH: 2.62 u[IU]/mL (ref 0.35–4.50)

## 2020-01-03 MED ORDER — CIPROFLOXACIN HCL 250 MG PO TABS: 250.0000 mg | ORAL_TABLET | Freq: Two times a day (BID) | ORAL | 0 refills | Status: DC

## 2020-01-03 NOTE — Patient Instructions (Addendum)
No acute finding on CXR and blood draw. Pending urine culture and head CT  Do not drive till we complete work up and symptoms resolve.  Start cipro as prescribed.
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
## 2020-01-04 ENCOUNTER — Other Ambulatory Visit: Payer: Self-pay

## 2020-01-04 ENCOUNTER — Encounter: Payer: Self-pay | Admitting: Nurse Practitioner

## 2020-01-04 DIAGNOSIS — R05 Cough: Secondary | ICD-10-CM

## 2020-01-04 DIAGNOSIS — R059 Cough, unspecified: Secondary | ICD-10-CM

## 2020-01-04 DIAGNOSIS — R053 Chronic cough: Secondary | ICD-10-CM

## 2020-01-04 LAB — URINE CULTURE
MICRO NUMBER:: 10137578
SPECIMEN QUALITY:: ADEQUATE

## 2020-01-05 ENCOUNTER — Ambulatory Visit
Admission: RE | Admit: 2020-01-05 | Discharge: 2020-01-05 | Disposition: A | Discharge status: Home / Self Care | Payer: Medicare Other | Source: Ambulatory Visit | Attending: Nurse Practitioner | Admitting: Nurse Practitioner

## 2020-01-05 ENCOUNTER — Other Ambulatory Visit: Payer: Self-pay

## 2020-01-05 DIAGNOSIS — R404 Transient alteration of awareness: Secondary | ICD-10-CM

## 2020-01-05 MED ORDER — IOPAMIDOL (ISOVUE-300) INJECTION 61%
75.0000 mL | Freq: Once | INTRAVENOUS | Status: AC | PRN
  Administered 2020-01-05: 75 mL via INTRAVENOUS

## 2020-01-05 NOTE — Addendum Note (Signed)
Addended by: Michaela Corner on: 01/05/2020 08:18 AM   Modules accepted: Orders

## 2020-01-08 ENCOUNTER — Encounter: Payer: Self-pay | Admitting: Neurology

## 2020-01-08 ENCOUNTER — Telehealth: Payer: Self-pay | Admitting: Nurse Practitioner

## 2020-01-08 NOTE — Telephone Encounter (Signed)
Spoke with the pt's daughter about the result--see re in result note.

## 2020-01-08 NOTE — Telephone Encounter (Signed)
Patient daughter is calling and wanted to see patient results from her CT were back yet. CB is [phone]

## 2020-01-09 NOTE — Progress Notes (Signed)
I called and spoke to patient daughter, patient daughter given the phone number to [facility], they had called and left message for patient to call and schedule appointment on 01/05/20.

## 2020-01-12 ENCOUNTER — Other Ambulatory Visit: Payer: Self-pay

## 2020-01-12 ENCOUNTER — Ambulatory Visit (HOSPITAL_COMMUNITY)
Admission: RE | Admit: 2020-01-12 | Discharge: 2020-01-12 | Disposition: A | Discharge status: Home / Self Care | Payer: Medicare Other | Source: Ambulatory Visit | Attending: Cardiology | Admitting: Cardiology

## 2020-01-12 DIAGNOSIS — R404 Transient alteration of awareness: Secondary | ICD-10-CM | POA: Diagnosis not present

## 2020-01-19 ENCOUNTER — Telehealth: Payer: Self-pay | Admitting: Nurse Practitioner

## 2020-01-19 NOTE — Telephone Encounter (Signed)
Pt daughter called and said we called her about having test results for ultra sound of her moms neck, Please call back when available. [phone]

## 2020-01-22 NOTE — Telephone Encounter (Signed)
Left message for patient to call back for results.  

## 2020-01-23 NOTE — Telephone Encounter (Signed)
Spoke with the daughter, aware of [place] result. Has neurology appt this Friday.   Pt is back to work now and stated she feels much better.

## 2020-01-26 ENCOUNTER — Other Ambulatory Visit: Payer: Self-pay

## 2020-01-26 ENCOUNTER — Ambulatory Visit: Payer: Medicare Other | Admitting: Neurology

## 2020-01-26 ENCOUNTER — Encounter: Payer: Self-pay | Admitting: Neurology

## 2020-01-26 VITALS — BP 156/86 | HR 71 | Ht <= 58 in | Wt 114.0 lb

## 2020-01-26 DIAGNOSIS — R404 Transient alteration of awareness: Secondary | ICD-10-CM | POA: Diagnosis not present
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
## 2020-01-26 NOTE — Patient Instructions (Addendum)
1. Schedule MRI brain with and without contrast  2. Schedule 1-hour EEG  3. As per [place] driving laws, no driving after an episode of loss of awareness until 6 months event-free  4. Follow-up in 3-4 months, call for any changes

## 2020-02-09 ENCOUNTER — Other Ambulatory Visit: Payer: Medicare Other

## 2020-02-14 ENCOUNTER — Ambulatory Visit (INDEPENDENT_AMBULATORY_CARE_PROVIDER_SITE_OTHER): Payer: Medicare Other | Admitting: Neurology

## 2020-02-14 ENCOUNTER — Other Ambulatory Visit: Payer: Self-pay

## 2020-02-14 DIAGNOSIS — R404 Transient alteration of awareness: Secondary | ICD-10-CM | POA: Diagnosis not present

## 2020-02-22 ENCOUNTER — Telehealth: Payer: Self-pay | Admitting: Nurse Practitioner

## 2020-02-22 NOTE — Progress Notes (Signed)
  Chronic Care Management   Outreach Note  02/22/2020 Name: [name] MRN: [id number] DOB: 03/28/1928  Referred by: [name], NP Reason for referral : No chief complaint on file.   An unsuccessful telephone outreach was attempted today. The patient was referred to the pharmacist for assistance with care management and care coordination.   Follow Up Plan:   SIGNATURE

## 2020-02-26 NOTE — Procedures (Signed)
ELECTROENCEPHALOGRAM REPORT  Date of Study: 02/14/2020  Patient's Name: [name] MRN: [id number] Date of Birth: 27-Feb-1928  Referring Provider: Dr. Patrcia Dolly  Clinical History: This is a 86 year old woman with an episode of confusion in February.  Medications: PROVENTIL HFA;VENTOLIN HFA 108 (90 Base) MCG/ACT inhaler aspirin EC 81 MG  TENORMIN 25 MG tablet VITAMIN D PO Take 5,000 Units daily PLAVIX 75 MG tablet MICROZIDE 12.5 MG capsule IMDUR 60 MG 24 hr tablet COZAAR 100 MG tablet NITROSTAT 0.4 MG SL tablet  Technical Summary: A multichannel digital 1-hour EEG recording measured by the international 10-20 system with electrodes applied with paste and impedances below 5000 ohms performed in our laboratory with EKG monitoring in an awake and asleep patient.  Hyperventilation was not performed. Photic stimulation was performed.  The digital EEG was referentially recorded, reformatted, and digitally filtered in a variety of bipolar and referential montages for optimal display.    Description: The patient is awake and asleep during the recording.  During maximal wakefulness, there is a symmetric, medium voltage 9 Hz posterior dominant rhythm that attenuates with eye opening.  The record is symmetric.  During drowsiness and sleep, there is an increase in theta slowing of the background, at times sharply contoured over the left temporal region without clear epileptogenic potential. Vertex waves and symmetric sleep spindles were seen.  Photic stimulation did not elicit any abnormalities.  There were no epileptiform discharges or electrographic seizures seen.    EKG lead was unremarkable.  Impression: This 1-hour awake and asleep EEG is within normal limits.  Clinical Correlation: A normal EEG does not exclude a clinical diagnosis of epilepsy.  If further clinical questions remain, prolonged EEG may be helpful.  Clinical correlation is advised.   Patrcia Dolly, M.D.

## 2020-02-28 ENCOUNTER — Telehealth: Payer: Self-pay

## 2020-02-28 ENCOUNTER — Telehealth: Payer: Self-pay | Admitting: Nurse Practitioner

## 2020-02-28 NOTE — Telephone Encounter (Signed)
-----   Message from Van Clines, MD sent at 02/27/2020  3:27 PM EDT ----- Pls let daughter know the EEG was normal, proceed with MRI brain as scheduled, thanks

## 2020-02-28 NOTE — Progress Notes (Signed)
  Chronic Care Management   Outreach Note  02/28/2020 Name: [name] MRN: [id number] DOB: [birth year]  Referred by: [name], NP Reason for referral : No chief complaint on file.   A second unsuccessful telephone outreach was attempted today. The patient was referred to pharmacist for assistance with care management and care coordination.  Follow Up Plan:   Lynnae January Upstream Scheduler

## 2020-02-28 NOTE — Telephone Encounter (Signed)
Spoke with pt daughter EEG was normal, proceed with MRI brain as scheduled. Verbalized understanding no questions asked

## 2020-03-05 ENCOUNTER — Telehealth: Payer: Self-pay | Admitting: Nurse Practitioner

## 2020-03-05 NOTE — Progress Notes (Signed)
  Chronic Care Management   Outreach Note  03/05/2020 Name: [name] MRN: [id number] DOB: 05-22-1928  Referred by: [name], NP Reason for referral : No chief complaint on file.   An unsuccessful telephone outreach was attempted today. The patient was referred to the pharmacist for assistance with care management and care coordination.   Follow Up Plan:   Lynnae January Upstream Scheduler

## 2020-03-08 ENCOUNTER — Telehealth: Payer: Self-pay | Admitting: Cardiovascular Disease

## 2020-03-08 ENCOUNTER — Ambulatory Visit
Admission: RE | Admit: 2020-03-08 | Discharge: 2020-03-08 | Disposition: A | Discharge status: Home / Self Care | Payer: Medicare Other | Source: Ambulatory Visit | Attending: Neurology | Admitting: Neurology

## 2020-03-08 DIAGNOSIS — R404 Transient alteration of awareness: Secondary | ICD-10-CM | POA: Diagnosis not present

## 2020-03-08 DIAGNOSIS — R413 Other amnesia: Secondary | ICD-10-CM | POA: Diagnosis not present

## 2020-03-08 MED ORDER — GADOBENATE DIMEGLUMINE 529 MG/ML IV SOLN
12.0000 mL | Freq: Once | INTRAVENOUS | Status: AC | PRN
  Administered 2020-03-08: 12 mL via INTRAVENOUS

## 2020-03-08 NOTE — Telephone Encounter (Signed)
We are recommending the COVID-19 vaccine to all of our patients. Cardiac medications (including blood thinners) should not deter anyone from being vaccinated and there is no need to hold any of those medications prior to vaccine administration.     Currently, there is a hotline to call (active 12/01/19) to schedule vaccination appointments as no walk-ins will be accepted.   Number: [phone].    If an appointment is not available please go to [email or web address] to sign up for notification when additional vaccine appointments are available.   If you have further questions or concerns about the vaccine process, please visit [email or web address] or contact your primary care physician.   

## 2020-03-14 ENCOUNTER — Telehealth: Payer: Self-pay | Admitting: Nurse Practitioner

## 2020-03-14 NOTE — Progress Notes (Signed)
  Chronic Care Management   Outreach Note  03/14/2020 Name: [name] MRN: [id number] DOB: 12/25/1927  Referred by: [name], NP Reason for referral : No chief complaint on file.   An unsuccessful telephone outreach was attempted today. The patient was referred to the pharmacist for assistance with care management and care coordination.   Follow Up Plan:   Lynnae January Upstream Scheduler

## 2020-03-21 ENCOUNTER — Telehealth: Payer: Self-pay | Admitting: Nurse Practitioner

## 2020-03-21 DIAGNOSIS — I10 Essential (primary) hypertension: Secondary | ICD-10-CM

## 2020-03-21 DIAGNOSIS — I2510 Atherosclerotic heart disease of native coronary artery without angina pectoris: Secondary | ICD-10-CM

## 2020-03-21 NOTE — Progress Notes (Signed)
  Chronic Care Management   Note  03/21/2020 Name: [name] MRN: [id number] DOB: [birth year]  [name] is a 86 y.o. year old female who is a primary care patient of Nche, Bonna Gains, NP. I reached out to [name] by phone today in response to a referral sent by [name]'s PCP, Nche, Bonna Gains, NP.   [name] was given information about Chronic Care Management services today including:  1. CCM service includes personalized support from designated clinical staff supervised by her physician, including individualized plan of care and coordination with other care providers 2. 24/7 contact phone numbers for assistance for urgent and routine care needs. 3. Service will only be billed when office clinical staff spend 20 minutes or more in a month to coordinate care. 4. Only one practitioner may furnish and bill the service in a calendar month. 5. The patient may stop CCM services at any time (effective at the end of the month) by phone call to the office staff.   Patient agreed to services and verbal consent obtained.   This note is not being shared with the patient for the following reason: To respect privacy (The patient or proxy has requested that the information not be shared).  Follow up plan:   Lynnae January Upstream Scheduler

## 2020-03-29 ENCOUNTER — Telehealth: Payer: Medicare Other

## 2020-03-29 NOTE — Addendum Note (Signed)
Addended by: Rene Paci on: 03/29/2020 11:59 AM   Modules accepted: Orders

## 2020-03-29 NOTE — Chronic Care Management (AMB) (Deleted)
Chronic Care Management Pharmacy  Name: [name]  MRN: [id number] DOB: 1928-03-19  Chief Complaint/ HPI  [name],  86 y.o. , female presents for their Initial CCM visit with the clinical pharmacist via telephone.  PCP : [name], NP  Their chronic conditions include: Hypertension, asthma, osteopenia, hyperlipidemia  Office Visits: 01/03/20: Patient presented to [facility] following an episode of alteration of awareness on 01/02/20. Patient started on  Ciprofloxacin for suspected UTI, but negative urine screen only trace leukocytes, negative for bacteria. Consult Visit: 01/26/20: Patient referred to Dr. Karel Jarvis (Neuro) for alteration of awareness. Patient with episode of confusion on 01/02/20. MRI brain, EEG normal.  10/25/20: Patient presented to Dr. Elease Hashimoto (Cardio) for HTN Follow-up. BP in clinic 140/70.   Medications: Outpatient Encounter Medications as of 03/29/2020  Medication Sig  . albuterol (PROVENTIL HFA;VENTOLIN HFA) 108 (90 Base) MCG/ACT inhaler Inhale 2 puffs into the lungs every 6 (six) hours as needed for wheezing or shortness of breath.  Marland Kitchen aspirin EC 81 MG tablet Take 81 mg by mouth daily.  Marland Kitchen atenolol (TENORMIN) 25 MG tablet TAKE 2 TABLETS BY MOUTH  DAILY  . Cholecalciferol (VITAMIN D PO) Take 5,000 Units daily by mouth.  . clopidogrel (PLAVIX) 75 MG tablet TAKE 1 TABLET BY MOUTH  DAILY WITH BREAKFAST  . hydrochlorothiazide (MICROZIDE) 12.5 MG capsule TAKE 1 CAPSULE BY MOUTH  DAILY  . isosorbide mononitrate (IMDUR) 60 MG 24 hr tablet TAKE 1 TABLET BY MOUTH  DAILY  . losartan (COZAAR) 100 MG tablet TAKE 1 TABLET BY MOUTH  DAILY  . nitroGLYCERIN (NITROSTAT) 0.4 MG SL tablet Place 1 tablet (0.4 mg total) every 5 (five) minutes as needed under the tongue for chest pain.   No facility-administered encounter medications on file as of 03/29/2020.     Current Diagnosis/Assessment:  Goals Addressed   None     COPD / Asthma / Tobacco   Last  spirometry score: ***  Gold Grade: {CHL HP Upstream Pharm COPD Gold AOZHY:[id number]} Current COPD Classification:  {CHL HP Upstream Pharm COPD Classification:[phone]}  Eosinophil count:   Lab Results  Component Value Date/Time   EOSPCT 3.3 01/03/2020 11:26 AM  %                               Eos (Absolute):  Lab Results  Component Value Date/Time   EOSABS 0.2 01/03/2020 11:26 AM    Tobacco Status:  Social History   Tobacco Use  Smoking Status Never Smoker  Smokeless Tobacco Never Used    Patient has failed these meds in past: *** Patient is currently {CHL Controlled/Uncontrolled:[phone]} on the following medications: ***  Albuterol 108 mcg/act 2 puff q6hr PRN  Using maintenance inhaler regularly? {yes/no:20286} Frequency of rescue inhaler use:  {CHL HP Upstream Pharm Inhaler XBMW:4132440102}  We discussed:  {CHL HP Upstream Pharmacy discussion:[phone]}  Plan  Continue {CHL HP Upstream Pharmacy Plans:[phone]}  and  Hypertension   BP today is:  {CHL HP UPSTREAM Pharmacist BP ranges:[phone]}  Office blood pressures are  BP Readings from Last 3 Encounters:  01/26/20 (!) 156/86  01/03/20 120/70  10/26/19 140/70   CMP Latest Ref Rng & Units 01/03/2020 10/26/2019 10/01/2017  Glucose 70 - 99 mg/dL 725(D) 89 664(Q)  BUN 6 - 23 mg/dL 03(K) 12 15  Creatinine 0.40 - 1.20 mg/dL 7.42 5.95 6.38  Sodium 135 - 145 mEq/L 139 139 134  Potassium 3.5 - 5.1  mEq/L 3.5 4.1 4.3  Chloride 96 - 112 mEq/L 101 100 94(L)  CO2 19 - 32 mEq/L 32 29 28  Calcium 8.4 - 10.5 mg/dL 10.0 9.3 9.5  Total Protein 6.1 - 8.1 g/dL - - -  Total Bilirubin 0.2 - 1.2 mg/dL - - -  Alkaline Phos 33 - 130 U/L - - -  AST 10 - 35 U/L - - -  ALT 6 - 29 U/L - - -   Patient has failed these meds in the past: *** Patient is currently {CHL Controlled/Uncontrolled:3083936763} on the following medications: ***  HCTZ 12.5 mg daily   Imdur 60 mg daily   Losartan 100 mg daily   Atenolol 50 mg daily     Patient checks BP at home {CHL HP BP Monitoring Frequency:[phone]}  Patient home BP readings are ranging: ***  We discussed {CHL HP Upstream Pharmacy [phone]}  Plan  Continue {CHL HP Upstream Pharmacy Plans:[phone]}     Hyperlipidemia   History of unstable angina, RCA stent 2001   Lipid Panel     Component Value Date/Time   CHOL 177 02/25/2016 0735   TRIG 77 02/25/2016 0735   HDL 45 (L) 02/25/2016 0735   CHOLHDL 3.9 02/25/2016 0735   VLDL 15 02/25/2016 0735   LDLCALC 117 02/25/2016 0735   LDLDIRECT 155.6 05/28/2011 1014     The ASCVD Risk score (Goff DC Jr., et al., 2013) failed to calculate for the following reasons:   The 2013 ASCVD risk score is only valid for ages 64 to 32   Patient has failed these meds in past: *** Patient is currently {CHL Controlled/Uncontrolled:3083936763} on the following medications: ***  Aspirin 81 mg daily   Clopidogrel 75 mg daily   Nitroglycerin SL PRN  We discussed:  {CHL HP Upstream Pharmacy [phone]}  Plan  Continue {CHL HP Upstream Pharmacy Plans:[phone]}  Osteopenia   Last DEXA Scan: 04/08/18  T-Score femoral neck: -0.8  T-Score total hip: -1.30  T-Score lumbar spine: +0.10  10-year probability of major osteoporotic fracture: 9.6%  10-year probability of hip fracture: 2.4%  Vit D, 25-Hydroxy  Date Value Ref Range Status  08/19/2015 28 (L) 30 - 100 ng/mL Final    Comment:    Vitamin D Status           25-OH Vitamin D        Deficiency                <20 ng/mL        Insufficiency         20 - 29 ng/mL        Optimal             > or = 30 ng/mL   For 25-OH Vitamin D testing on patients on D2-supplementation and patients for whom quantitation of D2 and D3 fractions is required, the QuestAssureD 25-OH VIT D, (D2,D3), LC/MS/MS is recommended: order code [phone] (patients > 2 yrs).      Patient is not a candidate for pharmacologic treatment  Patient has failed these meds in past: n/a  Patient is currently controlled on the following medications:   Vitamin D 5000 units daily  We discussed:  Recommend [id number] units of vitamin D daily. Recommend 1200 mg of calcium daily from dietary and supplemental sources.  Plan  Continue {CHL HP Upstream Pharmacy TKZSW:1093235573}  Vaccines   Reviewed and discussed patient's vaccination history.    Immunization History  Administered Date(s) Administered  . Influenza  Whole 09/21/2007  . Influenza,inj,Quad PF,6+ Mos 08/10/2014, 08/19/2015  . Tdap 11/25/2011    Plan  Recommended patient receive Covid-19 vaccine in   Medication Management   Pt uses *** pharmacy for all medications Uses pill box? {Yes or If no, why not?:20788} Pt endorses ***% compliance  We discussed: ***  Plan  {US Pharmacy UXLK:44010}    Follow up: *** month phone visit  ***

## 2020-04-02 ENCOUNTER — Telehealth: Payer: Medicare Other

## 2020-04-03 ENCOUNTER — Emergency Department (HOSPITAL_COMMUNITY)
Admission: EM | Admit: 2020-04-03 | Discharge: 2020-04-03 | Disposition: A | Discharge status: Home / Self Care | Payer: Medicare Other | Attending: Emergency Medicine | Admitting: Emergency Medicine

## 2020-04-03 ENCOUNTER — Emergency Department (HOSPITAL_COMMUNITY): Payer: Medicare Other

## 2020-04-03 ENCOUNTER — Other Ambulatory Visit: Payer: Self-pay

## 2020-04-03 ENCOUNTER — Encounter (HOSPITAL_COMMUNITY): Payer: Self-pay | Admitting: Pediatrics

## 2020-04-03 DIAGNOSIS — I110 Hypertensive heart disease with heart failure: Secondary | ICD-10-CM | POA: Diagnosis not present

## 2020-04-03 DIAGNOSIS — I509 Heart failure, unspecified: Secondary | ICD-10-CM | POA: Insufficient documentation

## 2020-04-03 DIAGNOSIS — Z20822 Contact with and (suspected) exposure to covid-19: Secondary | ICD-10-CM | POA: Diagnosis not present

## 2020-04-03 DIAGNOSIS — R918 Other nonspecific abnormal finding of lung field: Secondary | ICD-10-CM | POA: Diagnosis not present

## 2020-04-03 DIAGNOSIS — J189 Pneumonia, unspecified organism: Secondary | ICD-10-CM | POA: Diagnosis not present

## 2020-04-03 DIAGNOSIS — I10 Essential (primary) hypertension: Secondary | ICD-10-CM | POA: Diagnosis not present

## 2020-04-03 DIAGNOSIS — R509 Fever, unspecified: Secondary | ICD-10-CM | POA: Diagnosis present

## 2020-04-03 DIAGNOSIS — R4182 Altered mental status, unspecified: Secondary | ICD-10-CM | POA: Insufficient documentation

## 2020-04-03 LAB — COMPREHENSIVE METABOLIC PANEL
ALT: 13 U/L (ref 0–44)
AST: 26 U/L (ref 15–41)
Albumin: 3.4 g/dL — ABNORMAL LOW (ref 3.5–5.0)
Alkaline Phosphatase: 45 U/L (ref 38–126)
Anion gap: 12 (ref 5–15)
BUN: 17 mg/dL (ref 8–23)
CO2: 23 mmol/L (ref 22–32)
Calcium: 9.3 mg/dL (ref 8.9–10.3)
Chloride: 98 mmol/L (ref 98–111)
Creatinine, Ser: 0.94 mg/dL (ref 0.44–1.00)
GFR calc Af Amer: >60 mL/min (ref >60)
GFR calc non Af Amer: 53 mL/min — ABNORMAL LOW (ref >60)
Glucose, Bld: 115 mg/dL — ABNORMAL HIGH (ref 70–99)
Potassium: 3.5 mmol/L (ref 3.5–5.1)
Sodium: 133 mmol/L — ABNORMAL LOW (ref 135–145)
Total Bilirubin: 1 mg/dL (ref 0.3–1.2)
Total Protein: 6.7 g/dL (ref 6.5–8.1)

## 2020-04-03 LAB — URINALYSIS, ROUTINE W REFLEX MICROSCOPIC
Bacteria, UA: NONE SEEN
Bilirubin Urine: NEGATIVE
Glucose, UA: NEGATIVE mg/dL
Ketones, ur: NEGATIVE mg/dL
Leukocytes,Ua: NEGATIVE
Nitrite: NEGATIVE
Protein, ur: NEGATIVE mg/dL
Specific Gravity, Urine: 1.012 (ref 1.005–1.030)
pH: 6 (ref 5.0–8.0)

## 2020-04-03 LAB — SARS CORONAVIRUS 2 BY RT PCR (HOSPITAL ORDER, PERFORMED IN ~~LOC~~ HOSPITAL LAB): SARS Coronavirus 2: NEGATIVE

## 2020-04-03 LAB — CBC WITH DIFFERENTIAL/PLATELET
Abs Immature Granulocytes: 0.05 10*3/uL (ref 0.00–0.07)
Basophils Absolute: 0 10*3/uL (ref 0.0–0.1)
Basophils Relative: 0 %
Eosinophils Absolute: 0.1 10*3/uL (ref 0.0–0.5)
Eosinophils Relative: 1 %
HCT: 35.4 % — ABNORMAL LOW (ref 36.0–46.0)
Hemoglobin: 11.7 g/dL — ABNORMAL LOW (ref 12.0–15.0)
Immature Granulocytes: 0 %
Lymphocytes Relative: 5 %
Lymphs Abs: 0.6 10*3/uL — ABNORMAL LOW (ref 0.7–4.0)
MCH: 31.3 pg (ref 26.0–34.0)
MCHC: 33.1 g/dL (ref 30.0–36.0)
MCV: 94.7 fL (ref 80.0–100.0)
Monocytes Absolute: 0.6 10*3/uL (ref 0.1–1.0)
Monocytes Relative: 5 %
Neutro Abs: 10.3 10*3/uL — ABNORMAL HIGH (ref 1.7–7.7)
Neutrophils Relative %: 89 %
Platelets: 178 10*3/uL (ref 150–400)
RBC: 3.74 MIL/uL — ABNORMAL LOW (ref 3.87–5.11)
RDW: 12.8 % (ref 11.5–15.5)
WBC: 11.6 10*3/uL — ABNORMAL HIGH (ref 4.0–10.5)
nRBC: 0 % (ref 0.0–0.2)

## 2020-04-03 LAB — LACTIC ACID, PLASMA: Lactic Acid, Venous: 1.2 mmol/L (ref 0.5–1.9)

## 2020-04-03 MED ORDER — AZITHROMYCIN 250 MG PO TABS
500.0000 mg | ORAL_TABLET | Freq: Once | ORAL | Status: AC
  Administered 2020-04-03: 500 mg via ORAL
  Filled 2020-04-03: qty 2

## 2020-04-03 MED ORDER — DOXYCYCLINE HYCLATE 100 MG PO CAPS
100.0000 mg | ORAL_CAPSULE | Freq: Two times a day (BID) | ORAL | 0 refills | Status: DC
Start: 2020-04-03 — End: 2020-05-21

## 2020-04-03 MED ORDER — ALBUTEROL SULFATE HFA 108 (90 BASE) MCG/ACT IN AERS
1.0000 | INHALATION_SPRAY | RESPIRATORY_TRACT | Status: DC | PRN
  Administered 2020-04-03: 2 via RESPIRATORY_TRACT
  Filled 2020-04-03: qty 6.7

## 2020-04-03 MED ORDER — AEROCHAMBER PLUS FLO-VU MEDIUM MISC
1.0000 | Freq: Once | Status: AC
  Administered 2020-04-03: 15:00:00 1
  Filled 2020-04-03: qty 1

## 2020-04-03 MED ORDER — SODIUM CHLORIDE 0.9 % IV SOLN
1.0000 g | Freq: Once | INTRAVENOUS | Status: AC
  Administered 2020-04-03: 14:00:00 1 g via INTRAVENOUS
  Filled 2020-04-03: qty 10

## 2020-04-03 NOTE — ED Notes (Signed)
Taken for XRAY 
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
## 2020-04-03 NOTE — ED Triage Notes (Signed)
Came in POV; reported she doesn't know what was going on this morning and her  [place] granddaughter brought her in. Denies pain when asked. Family at bedside endorsed confusion "dazed", fatigue, weakness this morning along w/ nausea.

## 2020-04-04 LAB — URINE CULTURE

## 2020-04-08 LAB — CULTURE, BLOOD (ROUTINE X 2)
Culture: NO GROWTH
Culture: NO GROWTH
Special Requests: ADEQUATE
Special Requests: ADEQUATE

## 2020-04-10 ENCOUNTER — Telehealth: Payer: Self-pay | Admitting: Nurse Practitioner

## 2020-04-10 NOTE — Progress Notes (Signed)
  Chronic Care Management   Outreach Note  04/10/2020 Name: [name] MRN: [id number] DOB: 02/14/1928  Referred by: Nche, Charlotte Lum, NP Reason for referral : No chief complaint on file.   An unsuccessful telephone outreach was attempted today. The patient was referred to the pharmacist for assistance with care management and care coordination.   This note is not being shared with the patient for the following reason: To respect privacy (The patient or proxy has requested that the information not be shared).  Follow Up Plan:   Eileen Collins Upstream Scheduler 

## 2020-04-10 NOTE — Progress Notes (Signed)
  Chronic Care Management   Outreach Note  04/10/2020 Name: JAYLENE ARROWOOD MRN: [id number] DOB: 11-02-1928  Referred by: [name], NP Reason for referral : No chief complaint on file.   An unsuccessful telephone outreach was attempted today. The patient was referred to the pharmacist for assistance with care management and care coordination.   This note is not being shared with the patient for the following reason: To respect privacy (The patient or proxy has requested that the information not be shared).  Follow Up Plan:   Lynnae January Upstream Scheduler

## 2020-04-18 ENCOUNTER — Telehealth: Payer: Self-pay | Admitting: Nurse Practitioner

## 2020-04-18 NOTE — Progress Notes (Signed)
  Chronic Care Management   Outreach Note  04/18/2020 Name: [name] MRN: [id number] DOB: September 08, 1928  Referred by: [name], NP Reason for referral : No chief complaint on file.   Third unsuccessful telephone outreach was attempted today. The patient was referred to the pharmacist for assistance with care management and care coordination.   This note is not being shared with the patient for the following reason: To respect privacy (The patient or proxy has requested that the information not be shared).  Follow Up Plan:   Lynnae January Upstream Scheduler

## 2020-04-24 ENCOUNTER — Telehealth: Payer: Self-pay | Admitting: Nurse Practitioner

## 2020-04-24 NOTE — Progress Notes (Signed)
  Chronic Care Management   Outreach Note  04/24/2020 Name: LABRESHA MELLOR MRN: [id number] DOB: Mar 31, 1928  Referred by: [name], NP Reason for referral : No chief complaint on file.   An unsuccessful telephone outreach was attempted today. The patient was referred to the pharmacist for assistance with care management and care coordination.   This note is not being shared with the patient for the following reason: To respect privacy (The patient or proxy has requested that the information not be shared).  Follow Up Plan:   Lynnae January Upstream Scheduler

## 2020-04-30 ENCOUNTER — Ambulatory Visit: Payer: Medicare Other | Admitting: Neurology

## 2020-05-03 ENCOUNTER — Telehealth: Payer: Self-pay | Admitting: Cardiovascular Disease

## 2020-05-03 MED ORDER — NITROGLYCERIN 0.4 MG SL SUBL: 0.4000 mg | SUBLINGUAL_TABLET | SUBLINGUAL | 3 refills | Status: DC | PRN

## 2020-05-03 NOTE — Telephone Encounter (Signed)
*  STAT* If patient is at the pharmacy, call can be transferred to refill team.   1. Which medications need to be refilled? (please list name of each medication and dose if known) nitroGLYCERIN (NITROSTAT) 0.4 MG SL tablet  2. Which pharmacy/location (including street and city if local pharmacy) is medication to be sent to? [id number] - [place], [place] - [address]  3. Do they need a 30 day or 90 day supply? 2 bottles

## 2020-05-03 NOTE — Telephone Encounter (Signed)
Pt's medication was sent to pt's pharmacy as requested. Confirmation received.  °

## 2020-05-20 ENCOUNTER — Telehealth: Payer: Self-pay | Admitting: Cardiovascular Disease

## 2020-05-20 NOTE — Telephone Encounter (Signed)
Spoke with the patients daughter who would like for the patient to be seen in office as soon as possible as she feels as if her mother is declining. The patient had to take her Nitroglycerin 3 times last week in order to sleep. The patient is not currently SOB or experiencing active CP. The patient has not had any N/V, weight gain, swelling or diaphoresis. She works 35 hours a week currently. Her daughter thinks that she needs to cut back her hours and slow down but the patient is insistent on going to work, keeping busy etc. Appt made for tomorrow 6/29 8:45 am.
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
## 2020-05-20 NOTE — Telephone Encounter (Signed)
Daughter called on behalf of the patient   Pt c/o of Chest Pain: STAT if CP now or developed within 24 hours  1. Are you having CP right now? no  2. Are you experiencing any other symptoms (ex. SOB, nausea, vomiting, sweating)? Daughter is not sure.   3. How long have you been experiencing CP? Per sister " a lot more than normal"   4. Is your CP continuous or coming and going? Comes and goes   5. Have you taken Nitroglycerin? Pt took it 3 days last week  ?  Daughter of the patient called. She has been waking up in the middle of the night to use nitroglycerin more often. Her daughter said the most notable thing was that she took it 3x last week. The daughter wanted to know what Dr. Elease Hashimoto thought and if she needed to come in for a visit or not. Please call the daughter

## 2020-05-21 ENCOUNTER — Other Ambulatory Visit: Payer: Self-pay

## 2020-05-21 ENCOUNTER — Ambulatory Visit: Payer: Medicare Other | Admitting: Physician Assistant

## 2020-05-21 ENCOUNTER — Telehealth: Payer: Self-pay | Admitting: *Deleted

## 2020-05-21 ENCOUNTER — Encounter: Payer: Self-pay | Admitting: Physician Assistant

## 2020-05-21 VITALS — BP 126/70 | HR 63 | Ht <= 58 in | Wt 133.0 lb

## 2020-05-21 DIAGNOSIS — I25110 Atherosclerotic heart disease of native coronary artery with unstable angina pectoris: Secondary | ICD-10-CM | POA: Diagnosis not present

## 2020-05-21 DIAGNOSIS — I5032 Chronic diastolic (congestive) heart failure: Secondary | ICD-10-CM

## 2020-05-21 DIAGNOSIS — I10 Essential (primary) hypertension: Secondary | ICD-10-CM | POA: Diagnosis not present

## 2020-05-21 DIAGNOSIS — E782 Mixed hyperlipidemia: Secondary | ICD-10-CM | POA: Diagnosis not present

## 2020-05-21 DIAGNOSIS — E876 Hypokalemia: Secondary | ICD-10-CM

## 2020-05-21 LAB — CBC
Hematocrit: 34 % (ref 34.0–46.6)
Hemoglobin: 11.7 g/dL (ref 11.1–15.9)
MCH: 31.2 pg (ref 26.6–33.0)
MCHC: 34.4 g/dL (ref 31.5–35.7)
MCV: 91 fL (ref 79–97)
Platelets: 190 10*3/uL (ref 150–450)
RBC: 3.75 x10E6/uL — ABNORMAL LOW (ref 3.77–5.28)
RDW: 13.1 % (ref 11.7–15.4)
WBC: 5.7 10*3/uL (ref 3.4–10.8)

## 2020-05-21 LAB — BASIC METABOLIC PANEL
BUN/Creatinine Ratio: 16 (ref 12–28)
BUN: 13 mg/dL (ref 10–36)
CO2: 30 mmol/L — ABNORMAL HIGH (ref 20–29)
Calcium: 9.8 mg/dL (ref 8.7–10.3)
Chloride: 101 mmol/L (ref 96–106)
Creatinine, Ser: 0.81 mg/dL (ref 0.57–1.00)
GFR calc Af Amer: 74 mL/min/{1.73_m2} (ref >59)
GFR calc non Af Amer: 64 mL/min/{1.73_m2} (ref >59)
Glucose: 94 mg/dL (ref 65–99)
Potassium: 3.4 mmol/L — ABNORMAL LOW (ref 3.5–5.2)
Sodium: 138 mmol/L (ref 134–144)

## 2020-05-21 MED ORDER — ISOSORBIDE MONONITRATE ER 60 MG PO TB24: 90.0000 mg | ORAL_TABLET | Freq: Every day | ORAL | 0 refills | Status: DC

## 2020-05-21 MED ORDER — POTASSIUM CHLORIDE CRYS ER 20 MEQ PO TBCR
20.0000 meq | EXTENDED_RELEASE_TABLET | Freq: Every day | ORAL | 2 refills | Status: DC
Start: 2020-05-21 — End: 2020-09-27

## 2020-05-21 NOTE — Telephone Encounter (Signed)
I spoke with [name] in the cath lab and iStat to be done on 7/2 prior to cath. [name] notified of instructions from [place], [place].  Will send prescription to [facility] on [address].

## 2020-05-21 NOTE — Patient Instructions (Addendum)
Medication Instructions:  Your physician has recommended you make the following change in your medication:   1) INCREASE ISOSORBIDE TO 90MG  DAILY  *If you need a refill on your cardiac medications before your next appointment, please call your pharmacy*   Lab Work: BMET, CBC   If you have labs (blood work) drawn today and your tests are completely normal, you will receive your results only by: MyChart Message (if you have MyChart) OR . A paper copy in the mail If you have any lab test that is abnormal or we need to change your treatment, we will call you to review the results.   Testing/Procedures: Your physician has requested that you have a cardiac catheterization. Cardiac catheterization is used to diagnose and/or treat various heart conditions. Doctors may recommend this procedure for a number of different reasons. The most common reason is to evaluate chest pain. Chest pain can be a symptom of coronary artery disease (CAD), and cardiac catheterization can show whether plaque is narrowing or blocking your heart's arteries. This procedure is also used to evaluate the valves, as well as measure the blood flow and oxygen levels in different parts of your heart. For further information please visit [facility]. Please follow instruction sheet, as given.     Follow-Up: At [facility], you and your health needs are our priority.  As part of our continuing mission to provide you with exceptional heart care, we have created designated Provider Care Teams.  These Care Teams include your primary Cardiologist (physician) and Advanced Practice Providers (APPs -  Physician Assistants and Nurse Practitioners) who all work together to provide you with the care you need, when you need it.  We recommend signing up for the patient portal called "MyChart".  Sign up information is provided on this After Visit Summary.  MyChart is used to connect with patients for Virtual Visits (Telemedicine).   Patients are able to view lab/test results, encounter notes, upcoming appointments, etc.  Non-urgent messages can be sent to your provider as well.   To learn more about what you can do with MyChart, go to [facility].    Your next appointment:   06/07/2020 @ 8:45 with 06/09/2020, PA   Other Instructions    [facility] CARDIOVASCULAR DIVISION [facility] ST OFFICE [address] [place], [address] [place] Dept: [phone] Loc: [phone]  [name]  05/21/2020  You are scheduled for a Cardiac Catheterization on Friday, July 2 with Dr. Peter 06-04-1981.  1. Please arrive at the [facility] (Main Entrance A) at [facility]: [address] [place], [place] at 5:30 AM (This time is two hours before your procedure to ensure your preparation). Free valet parking service is available.   Special note: Every effort is made to have your procedure done on time. Please understand that emergencies sometimes delay scheduled procedures.  2. Diet: Do not eat solid foods after midnight.  The patient may have clear liquids until 5am upon the day of the procedure.  3. Labs: You will need to have blood drawn today.  4. Medication instructions in preparation for your procedure:   Contrast Allergy: No  Do not take Hydrochlorithiazide the morning of your procedure!  On the morning of your procedure, take your Aspirin and Clopidogrel and any morning medicines NOT listed above.  You may use sips of water.  5. Plan for one night stay--bring personal belongings. 6. Bring a current list of your  medications and current insurance cards. 7. You MUST have a responsible person to drive you home. 8. Someone MUST be with you the first 24 hours after you arrive home or your discharge will be delayed. 9. Please wear clothes that are easy to get on and off and wear slip-on shoes.  Thank you for allowing [place] to care for you!   --  [facility]

## 2020-05-21 NOTE — Telephone Encounter (Signed)
-----   Message from Beatrice Lecher, PA-C sent at 05/21/2020  1:43 PM EDT ----- Creatinine, Hgb normal.  K+ low.   PLAN:   - Start K+ >> take 40 mEq x 1, then 20 mEq once daily   - Notify cath lab so iStat can be done to make sure K+ normal prior to cath on 7/2   - If it is not possible to get iStat in hospital prior to cath, arrange repeat BMET 7/1 and run stat   - Also, schedule repeat BMET 7/16 during office visit  Tereso Newcomer, PA-C    05/21/2020 1:35 PM

## 2020-05-22 ENCOUNTER — Telehealth: Payer: Self-pay

## 2020-05-22 NOTE — Telephone Encounter (Signed)
Left detailed message as per DPR approval for daughter/ pt re:   Pt contacted pre-catheterization scheduled at [facility] for: 05/24/20.  Verified arrival time and place: [facility] Main Entrance [facility]) at: 5:30 am    No solid food after midnight prior to cath, clear liquids until 5 AM day of procedure.   AM meds can be  taken pre-cath with sips of water including: ASA 81 mg Plavix 75 mg   Hold HCTZ a.m. of 05/24/20  Confirmed patient has responsible adult to drive home post procedure and observe 24 hours after arriving home:   You are allowed ONE visitor in the waiting room during your procedure. Both you and your visitor must wear masks.   Will also send message to her My Chart.

## 2020-05-24 ENCOUNTER — Encounter (HOSPITAL_COMMUNITY)
Admission: RE | Disposition: A | Discharge status: Home / Self Care | Payer: Self-pay | Source: Home / Self Care | Attending: Cardiology

## 2020-05-24 ENCOUNTER — Ambulatory Visit (HOSPITAL_COMMUNITY)
Admission: RE | Admit: 2020-05-24 | Discharge: 2020-05-25 | Disposition: A | Discharge status: Home / Self Care | Payer: Medicare Other | Attending: Cardiology | Admitting: Cardiology

## 2020-05-24 ENCOUNTER — Other Ambulatory Visit: Payer: Self-pay

## 2020-05-24 ENCOUNTER — Encounter (HOSPITAL_COMMUNITY): Payer: Self-pay | Admitting: Cardiology

## 2020-05-24 DIAGNOSIS — T82855A Stenosis of coronary artery stent, initial encounter: Secondary | ICD-10-CM | POA: Insufficient documentation

## 2020-05-24 DIAGNOSIS — I110 Hypertensive heart disease with heart failure: Secondary | ICD-10-CM | POA: Diagnosis not present

## 2020-05-24 DIAGNOSIS — Z79899 Other long term (current) drug therapy: Secondary | ICD-10-CM | POA: Diagnosis not present

## 2020-05-24 DIAGNOSIS — Z9861 Coronary angioplasty status: Secondary | ICD-10-CM

## 2020-05-24 DIAGNOSIS — E785 Hyperlipidemia, unspecified: Secondary | ICD-10-CM | POA: Diagnosis present

## 2020-05-24 DIAGNOSIS — I200 Unstable angina: Secondary | ICD-10-CM | POA: Diagnosis present

## 2020-05-24 DIAGNOSIS — M8580 Other specified disorders of bone density and structure, unspecified site: Secondary | ICD-10-CM | POA: Diagnosis not present

## 2020-05-24 DIAGNOSIS — Z7902 Long term (current) use of antithrombotics/antiplatelets: Secondary | ICD-10-CM | POA: Diagnosis not present

## 2020-05-24 DIAGNOSIS — Z955 Presence of coronary angioplasty implant and graft: Secondary | ICD-10-CM

## 2020-05-24 DIAGNOSIS — M1990 Unspecified osteoarthritis, unspecified site: Secondary | ICD-10-CM | POA: Diagnosis not present

## 2020-05-24 DIAGNOSIS — J45909 Unspecified asthma, uncomplicated: Secondary | ICD-10-CM | POA: Insufficient documentation

## 2020-05-24 DIAGNOSIS — K219 Gastro-esophageal reflux disease without esophagitis: Secondary | ICD-10-CM | POA: Diagnosis not present

## 2020-05-24 DIAGNOSIS — Z8249 Family history of ischemic heart disease and other diseases of the circulatory system: Secondary | ICD-10-CM | POA: Diagnosis not present

## 2020-05-24 DIAGNOSIS — Z7982 Long term (current) use of aspirin: Secondary | ICD-10-CM | POA: Insufficient documentation

## 2020-05-24 DIAGNOSIS — I25110 Atherosclerotic heart disease of native coronary artery with unstable angina pectoris: Secondary | ICD-10-CM | POA: Insufficient documentation

## 2020-05-24 DIAGNOSIS — I5032 Chronic diastolic (congestive) heart failure: Secondary | ICD-10-CM | POA: Insufficient documentation

## 2020-05-24 DIAGNOSIS — I2584 Coronary atherosclerosis due to calcified coronary lesion: Secondary | ICD-10-CM | POA: Diagnosis not present

## 2020-05-24 DIAGNOSIS — Z885 Allergy status to narcotic agent status: Secondary | ICD-10-CM | POA: Diagnosis not present

## 2020-05-24 DIAGNOSIS — Z888 Allergy status to other drugs, medicaments and biological substances status: Secondary | ICD-10-CM | POA: Diagnosis not present

## 2020-05-24 DIAGNOSIS — I10 Essential (primary) hypertension: Secondary | ICD-10-CM | POA: Diagnosis present

## 2020-05-24 DIAGNOSIS — E782 Mixed hyperlipidemia: Secondary | ICD-10-CM | POA: Insufficient documentation

## 2020-05-24 DIAGNOSIS — I2510 Atherosclerotic heart disease of native coronary artery without angina pectoris: Secondary | ICD-10-CM | POA: Diagnosis present

## 2020-05-24 HISTORY — DX: Unstable angina: I20.0

## 2020-05-24 HISTORY — PX: INTRAVASCULAR ULTRASOUND/IVUS: CATH118244

## 2020-05-24 HISTORY — PX: CORONARY STENT INTERVENTION: CATH118234

## 2020-05-24 HISTORY — PX: LEFT HEART CATH AND CORONARY ANGIOGRAPHY: CATH118249

## 2020-05-24 HISTORY — DX: Unspecified diastolic (congestive) heart failure: I50.30

## 2020-05-24 LAB — POCT ACTIVATED CLOTTING TIME
Activated Clotting Time: 219 seconds
Activated Clotting Time: 224 seconds
Activated Clotting Time: 241 seconds
Activated Clotting Time: 307 seconds
Activated Clotting Time: 318 seconds

## 2020-05-24 LAB — CBC
HCT: 33.7 % — ABNORMAL LOW (ref 36.0–46.0)
Hemoglobin: 10.9 g/dL — ABNORMAL LOW (ref 12.0–15.0)
MCH: 30.6 pg (ref 26.0–34.0)
MCHC: 32.3 g/dL (ref 30.0–36.0)
MCV: 94.7 fL (ref 80.0–100.0)
Platelets: 150 10*3/uL (ref 150–400)
RBC: 3.56 MIL/uL — ABNORMAL LOW (ref 3.87–5.11)
RDW: 13.2 % (ref 11.5–15.5)
WBC: 7.2 10*3/uL (ref 4.0–10.5)
nRBC: 0 % (ref 0.0–0.2)

## 2020-05-24 LAB — CREATININE, SERUM
Creatinine, Ser: 0.74 mg/dL (ref 0.44–1.00)
GFR calc Af Amer: >60 mL/min (ref >60)
GFR calc non Af Amer: >60 mL/min (ref >60)

## 2020-05-24 LAB — POTASSIUM: Potassium: 4.6 mmol/L (ref 3.5–5.1)

## 2020-05-24 SURGERY — LEFT HEART CATH AND CORONARY ANGIOGRAPHY
Anesthesia: LOCAL

## 2020-05-24 MED ORDER — FENTANYL CITRATE (PF) 100 MCG/2ML IJ SOLN
INTRAMUSCULAR | Status: DC | PRN
  Administered 2020-05-24: 25 ug via INTRAVENOUS

## 2020-05-24 MED ORDER — LABETALOL HCL 5 MG/ML IV SOLN
10.0000 mg | INTRAVENOUS | Status: AC | PRN
  Administered 2020-05-24 (×4): 10 mg via INTRAVENOUS

## 2020-05-24 MED ORDER — ISOSORBIDE MONONITRATE ER 60 MG PO TB24
90.0000 mg | ORAL_TABLET | Freq: Every day | ORAL | Status: DC
  Administered 2020-05-25: 90 mg via ORAL
  Filled 2020-05-24: qty 1

## 2020-05-24 MED ORDER — CLOPIDOGREL BISULFATE 75 MG PO TABS
75.0000 mg | ORAL_TABLET | Freq: Every day | ORAL | Status: DC
  Administered 2020-05-25: 75 mg via ORAL
  Filled 2020-05-24: qty 1

## 2020-05-24 MED ORDER — HYDRALAZINE HCL 20 MG/ML IJ SOLN
10.0000 mg | INTRAMUSCULAR | Status: AC | PRN
  Administered 2020-05-24: 10 mg via INTRAVENOUS

## 2020-05-24 MED ORDER — HEPARIN SODIUM (PORCINE) 1000 UNIT/ML IJ SOLN
INTRAMUSCULAR | Status: DC | PRN
  Administered 2020-05-24 (×5): 3000 [IU] via INTRAVENOUS

## 2020-05-24 MED ORDER — SODIUM CHLORIDE 0.9 % IV SOLN: 250.0000 mL | INTRAVENOUS | Status: DC | PRN

## 2020-05-24 MED ORDER — MIDAZOLAM HCL 2 MG/2ML IJ SOLN
INTRAMUSCULAR | Status: DC | PRN
  Administered 2020-05-24: 1 mg via INTRAVENOUS

## 2020-05-24 MED ORDER — POTASSIUM CHLORIDE CRYS ER 20 MEQ PO TBCR
20.0000 meq | EXTENDED_RELEASE_TABLET | Freq: Every day | ORAL | Status: DC
  Administered 2020-05-25: 20 meq via ORAL
  Filled 2020-05-24: qty 1

## 2020-05-24 MED ORDER — HEPARIN SODIUM (PORCINE) 1000 UNIT/ML IJ SOLN
INTRAMUSCULAR | Status: AC
  Filled 2020-05-24: qty 1

## 2020-05-24 MED ORDER — LIDOCAINE HCL (PF) 1 % IJ SOLN
INTRAMUSCULAR | Status: AC
  Filled 2020-05-24: qty 30

## 2020-05-24 MED ORDER — SODIUM CHLORIDE 0.9 % WEIGHT BASED INFUSION: 1.0000 mL/kg/h | INTRAVENOUS | Status: AC

## 2020-05-24 MED ORDER — NITROGLYCERIN 0.4 MG SL SUBL: 0.4000 mg | SUBLINGUAL_TABLET | SUBLINGUAL | Status: DC | PRN

## 2020-05-24 MED ORDER — ASPIRIN EC 81 MG PO TBEC
81.0000 mg | DELAYED_RELEASE_TABLET | Freq: Every day | ORAL | Status: DC
  Administered 2020-05-25: 81 mg via ORAL
  Filled 2020-05-24: qty 1

## 2020-05-24 MED ORDER — ONDANSETRON HCL 4 MG/2ML IJ SOLN
INTRAMUSCULAR | Status: AC
  Filled 2020-05-24: qty 2

## 2020-05-24 MED ORDER — MIDAZOLAM HCL 2 MG/2ML IJ SOLN
INTRAMUSCULAR | Status: AC
  Filled 2020-05-24: qty 2

## 2020-05-24 MED ORDER — VERAPAMIL HCL 2.5 MG/ML IV SOLN
INTRAVENOUS | Status: AC
  Filled 2020-05-24: qty 2

## 2020-05-24 MED ORDER — HYDRALAZINE HCL 20 MG/ML IJ SOLN
INTRAMUSCULAR | Status: AC
  Filled 2020-05-24: qty 1

## 2020-05-24 MED ORDER — NITROGLYCERIN 1 MG/10 ML FOR IR/CATH LAB
INTRA_ARTERIAL | Status: DC | PRN
  Administered 2020-05-24 (×3): 200 ug via INTRACORONARY

## 2020-05-24 MED ORDER — ASPIRIN 81 MG PO CHEW: 81.0000 mg | CHEWABLE_TABLET | ORAL | Status: DC

## 2020-05-24 MED ORDER — HEPARIN (PORCINE) IN NACL 1000-0.9 UT/500ML-% IV SOLN
INTRAVENOUS | Status: AC
  Filled 2020-05-24: qty 1000

## 2020-05-24 MED ORDER — HYDROCHLOROTHIAZIDE 12.5 MG PO CAPS
12.5000 mg | ORAL_CAPSULE | Freq: Every day | ORAL | Status: DC
  Administered 2020-05-25: 12.5 mg via ORAL
  Filled 2020-05-24: qty 1

## 2020-05-24 MED ORDER — LIDOCAINE HCL (PF) 1 % IJ SOLN
INTRAMUSCULAR | Status: DC | PRN
  Administered 2020-05-24: 2 mL

## 2020-05-24 MED ORDER — FENTANYL CITRATE (PF) 100 MCG/2ML IJ SOLN
INTRAMUSCULAR | Status: AC
  Filled 2020-05-24: qty 2

## 2020-05-24 MED ORDER — HEPARIN (PORCINE) IN NACL 1000-0.9 UT/500ML-% IV SOLN
INTRAVENOUS | Status: DC | PRN
  Administered 2020-05-24 (×3): 500 mL

## 2020-05-24 MED ORDER — SODIUM CHLORIDE 0.9 % WEIGHT BASED INFUSION: 1.0000 mL/kg/h | INTRAVENOUS | Status: DC

## 2020-05-24 MED ORDER — ATENOLOL 25 MG PO TABS
50.0000 mg | ORAL_TABLET | Freq: Every day | ORAL | Status: DC
  Administered 2020-05-24 – 2020-05-25 (×2): 50 mg via ORAL
  Filled 2020-05-24 (×2): qty 2

## 2020-05-24 MED ORDER — LABETALOL HCL 5 MG/ML IV SOLN
INTRAVENOUS | Status: AC
  Filled 2020-05-24: qty 4

## 2020-05-24 MED ORDER — ZINC 50 MG PO TABS: 50.0000 mg | ORAL_TABLET | Freq: Every day | ORAL | Status: DC

## 2020-05-24 MED ORDER — LOSARTAN POTASSIUM 50 MG PO TABS
100.0000 mg | ORAL_TABLET | Freq: Every day | ORAL | Status: DC
  Administered 2020-05-25: 100 mg via ORAL
  Filled 2020-05-24: qty 2

## 2020-05-24 MED ORDER — ENOXAPARIN SODIUM 30 MG/0.3ML ~~LOC~~ SOLN
30.0000 mg | SUBCUTANEOUS | Status: DC
  Administered 2020-05-25: 30 mg via SUBCUTANEOUS
  Filled 2020-05-24: qty 0.3

## 2020-05-24 MED ORDER — HEPARIN (PORCINE) IN NACL 1000-0.9 UT/500ML-% IV SOLN
INTRAVENOUS | Status: AC
  Filled 2020-05-24: qty 500

## 2020-05-24 MED ORDER — SODIUM CHLORIDE 0.9% FLUSH: 3.0000 mL | Freq: Two times a day (BID) | INTRAVENOUS | Status: DC

## 2020-05-24 MED ORDER — ACETAMINOPHEN 325 MG PO TABS
650.0000 mg | ORAL_TABLET | ORAL | Status: DC | PRN
  Administered 2020-05-25: 650 mg via ORAL
  Filled 2020-05-24: qty 2

## 2020-05-24 MED ORDER — CALCIUM CARBONATE-VITAMIN D 500-200 MG-UNIT PO TABS
1.0000 | ORAL_TABLET | Freq: Every day | ORAL | Status: DC
  Administered 2020-05-25: 1 via ORAL
  Filled 2020-05-24: qty 1

## 2020-05-24 MED ORDER — IOHEXOL 350 MG/ML SOLN
INTRAVENOUS | Status: DC | PRN
  Administered 2020-05-24: 150 mL

## 2020-05-24 MED ORDER — ONDANSETRON HCL 4 MG/2ML IJ SOLN
4.0000 mg | Freq: Four times a day (QID) | INTRAMUSCULAR | Status: DC | PRN
  Administered 2020-05-24: 4 mg via INTRAVENOUS

## 2020-05-24 MED ORDER — VERAPAMIL HCL 2.5 MG/ML IV SOLN
INTRAVENOUS | Status: DC | PRN
  Administered 2020-05-24: 10 mL via INTRA_ARTERIAL

## 2020-05-24 MED ORDER — VITAMIN D 25 MCG (1000 UNIT) PO TABS
1000.0000 [IU] | ORAL_TABLET | Freq: Every day | ORAL | Status: DC
  Administered 2020-05-25: 1000 [IU] via ORAL
  Filled 2020-05-24: qty 1

## 2020-05-24 MED ORDER — SODIUM CHLORIDE 0.9 % WEIGHT BASED INFUSION
3.0000 mL/kg/h | INTRAVENOUS | Status: DC
  Administered 2020-05-24: 3 mL/kg/h via INTRAVENOUS

## 2020-05-24 MED ORDER — SODIUM CHLORIDE 0.9% FLUSH: 3.0000 mL | INTRAVENOUS | Status: DC | PRN

## 2020-05-24 MED ORDER — NITROGLYCERIN 1 MG/10 ML FOR IR/CATH LAB
INTRA_ARTERIAL | Status: AC
  Filled 2020-05-24: qty 10

## 2020-05-24 SURGICAL SUPPLY — 30 items
BALLN EMERGE MR 4.0X20 (BALLOONS) ×2
BALLN SAPPHIRE 2.5X12 (BALLOONS) ×2
BALLN SAPPHIRE ~~LOC~~ 2.5X12 (BALLOONS) ×1 IMPLANT
BALLN SAPPHIRE ~~LOC~~ 3.0X12 (BALLOONS) ×1 IMPLANT
BALLN SAPPHIRE ~~LOC~~ 3.5X12 (BALLOONS) ×1 IMPLANT
BALLN ~~LOC~~ EMERGE MR 2.0X12 (BALLOONS) ×2
BALLN ~~LOC~~ EMERGE MR 4.0X20 (BALLOONS) ×2
BALLOON EMERGE MR 4.0X20 (BALLOONS) IMPLANT
BALLOON SAPPHIRE 2.5X12 (BALLOONS) IMPLANT
BALLOON ~~LOC~~ EMERGE MR 2.0X12 (BALLOONS) IMPLANT
BALLOON ~~LOC~~ EMERGE MR 4.0X20 (BALLOONS) IMPLANT
CATH 5FR JL3.5 JR4 ANG PIG MP (CATHETERS) ×1 IMPLANT
CATH LAUNCHER 6FR AL.75 (CATHETERS) ×1 IMPLANT
CATH OPTICROSS HD (CATHETERS) ×1 IMPLANT
CATH SHOCKWAVE 3.0X12 (CATHETERS) IMPLANT
CATH TELESCOPE 6F GEC (CATHETERS) ×1 IMPLANT
CATHETER SHOCKWAVE 3.0X12 (CATHETERS) ×2
DEVICE RAD TR BAND REGULAR (VASCULAR PRODUCTS) ×1 IMPLANT
GLIDESHEATH SLEND SS 6F .021 (SHEATH) ×1 IMPLANT
GUIDEWIRE INQWIRE 1.5J.035X260 (WIRE) IMPLANT
INQWIRE 1.5J .035X260CM (WIRE) ×2
KIT ENCORE 26 ADVANTAGE (KITS) ×1 IMPLANT
KIT HEART LEFT (KITS) ×2 IMPLANT
PACK CARDIAC CATHETERIZATION (CUSTOM PROCEDURE TRAY) ×2 IMPLANT
SLED PULL BACK IVUS (MISCELLANEOUS) ×1 IMPLANT
STENT SYNERGY XD 3.50X48 (Permanent Stent) IMPLANT
SYNERGY XD 3.50X48 (Permanent Stent) ×2 IMPLANT
TRANSDUCER W/STOPCOCK (MISCELLANEOUS) ×2 IMPLANT
TUBING CIL FLEX 10 FLL-RA (TUBING) ×2 IMPLANT
WIRE ASAHI PROWATER 180CM (WIRE) ×2 IMPLANT

## 2020-05-24 NOTE — Progress Notes (Signed)
Patients TR band ooze constant since arrival. Arm elevated and many attempts to lowwer bp. Charge contacted and with help of lab staff band removed and hematoma expressed but hemastatis never obtained. New Tr band placed. Site still oozing Dr Swaziland to room and discussed next step.  Rad stat placed by charge and no bleeding noted. Site CDI level 1 d/t bruising.  Patient states more comfortable now.

## 2020-05-24 NOTE — Interval H&P Note (Signed)
History and Physical Interval Note:  05/24/2020 7:23 AM  [name]  has presented today for surgery, with the diagnosis of cad.  The various methods of treatment have been discussed with the patient and family. After consideration of risks, benefits and other options for treatment, the patient has consented to  Procedure(s): LEFT HEART CATH AND CORONARY ANGIOGRAPHY (N/A) as a surgical intervention.  The patient's history has been reviewed, patient examined, no change in status, stable for surgery.  I have reviewed the patient's chart and labs.  Questions were answered to the patient's satisfaction.    Cath Lab Visit (complete for each Cath Lab visit)  Clinical Evaluation Leading to the Procedure:   ACS: No.  Non-ACS:    Anginal Classification: CCS III  Anti-ischemic medical therapy: Maximal Therapy (2 or more classes of medications)  Non-Invasive Test Results: No non-invasive testing performed  Prior CABG: No previous CABG       Theron Arista [facility] 05/24/2020 7:23 AM

## 2020-05-25 ENCOUNTER — Encounter (HOSPITAL_COMMUNITY): Payer: Self-pay | Admitting: Cardiology

## 2020-05-25 DIAGNOSIS — I200 Unstable angina: Secondary | ICD-10-CM

## 2020-05-25 DIAGNOSIS — I2584 Coronary atherosclerosis due to calcified coronary lesion: Secondary | ICD-10-CM | POA: Diagnosis not present

## 2020-05-25 DIAGNOSIS — I25119 Atherosclerotic heart disease of native coronary artery with unspecified angina pectoris: Secondary | ICD-10-CM | POA: Diagnosis not present

## 2020-05-25 DIAGNOSIS — T82855A Stenosis of coronary artery stent, initial encounter: Secondary | ICD-10-CM | POA: Diagnosis not present

## 2020-05-25 DIAGNOSIS — I25110 Atherosclerotic heart disease of native coronary artery with unstable angina pectoris: Secondary | ICD-10-CM | POA: Diagnosis not present

## 2020-05-25 DIAGNOSIS — Z955 Presence of coronary angioplasty implant and graft: Secondary | ICD-10-CM | POA: Diagnosis not present

## 2020-05-25 LAB — BASIC METABOLIC PANEL
Anion gap: 7 (ref 5–15)
BUN: 12 mg/dL (ref 8–23)
CO2: 26 mmol/L (ref 22–32)
Calcium: 9 mg/dL (ref 8.9–10.3)
Chloride: 104 mmol/L (ref 98–111)
Creatinine, Ser: 0.92 mg/dL (ref 0.44–1.00)
GFR calc Af Amer: >60 mL/min (ref >60)
GFR calc non Af Amer: 55 mL/min — ABNORMAL LOW (ref >60)
Glucose, Bld: 102 mg/dL — ABNORMAL HIGH (ref 70–99)
Potassium: 3.6 mmol/L (ref 3.5–5.1)
Sodium: 137 mmol/L (ref 135–145)

## 2020-05-25 LAB — CBC
HCT: 30.4 % — ABNORMAL LOW (ref 36.0–46.0)
Hemoglobin: 9.9 g/dL — ABNORMAL LOW (ref 12.0–15.0)
MCH: 31.4 pg (ref 26.0–34.0)
MCHC: 32.6 g/dL (ref 30.0–36.0)
MCV: 96.5 fL (ref 80.0–100.0)
Platelets: 149 10*3/uL — ABNORMAL LOW (ref 150–400)
RBC: 3.15 MIL/uL — ABNORMAL LOW (ref 3.87–5.11)
RDW: 13.6 % (ref 11.5–15.5)
WBC: 5.9 10*3/uL (ref 4.0–10.5)
nRBC: 0 % (ref 0.0–0.2)

## 2020-05-25 NOTE — H&P (View-Only) (Signed)
Progress Note  Patient Name: [name] Date of Encounter: 05/25/2020  Primary Cardiologist: [name], MD  Subjective   No chest pain or shortness of breath.  No right wrist pain.  Has not been up yet to ambulate other than in the room this morning.  Inpatient Medications    Scheduled Meds: . aspirin EC  81 mg Oral Daily  . atenolol  50 mg Oral Daily  . calcium-vitamin D  1 tablet Oral Q breakfast  . cholecalciferol  1,000 Units Oral Daily  . clopidogrel  75 mg Oral Daily  . enoxaparin (LOVENOX) injection  30 mg Subcutaneous Q24H  . hydrochlorothiazide  12.5 mg Oral Daily  . isosorbide mononitrate  90 mg Oral Daily  . losartan  100 mg Oral Daily  . potassium chloride SA  20 mEq Oral Daily  . sodium chloride flush  3 mL Intravenous Q12H   Continuous Infusions: . sodium chloride     PRN Meds: sodium chloride, acetaminophen, nitroGLYCERIN, ondansetron (ZOFRAN) IV, sodium chloride flush   Vital Signs    Vitals:   05/24/20 1743 05/24/20 1934 05/25/20 0010 05/25/20 0441  BP: (!) 128/92 136/63 124/63 (!) 146/76  Pulse: 85 96 96 84  Resp:  20 17 15   Temp:  98.7 F (37.1 C) (!) 100.7 F (38.2 C) 98.4 F (36.9 C)  TempSrc:  Axillary Oral Oral  SpO2:  100% 100% 100%  Weight:    65.5 kg  Height:        Intake/Output Summary (Last 24 hours) at 05/25/2020 0836 Last data filed at 05/24/2020 2200 Gross per 24 hour  Intake 480 ml  Output 300 ml  Net 180 ml   Filed Weights   05/24/20 0547 05/25/20 0441  Weight: 60.3 kg 65.5 kg    Telemetry    Sinus rhythm.  Personally reviewed.  ECG    An ECG dated 05/25/2020 was personally reviewed today and demonstrated:  Sinus rhythm with prolonged PR interval, IVCD, nonspecific ST-T changes.  Physical Exam   GEN:  Elderly woman.  No acute distress.   Neck: No JVD. Cardiac: RRR, soft systolic murmur, no gallop.  Respiratory: Nonlabored. Clear to auscultation bilaterally. GI: Soft, nontender, bowel sounds  present. MS: No edema; No deformity.  Right radial access site stable, no hematoma or bleeding, ecchymosis present. Neuro:  Nonfocal. Psych: Alert and oriented x 3. Normal affect.  Labs    Chemistry Recent Labs  Lab 05/21/20 [phone] 05/24/20 0611 05/24/20 1532 05/25/20 0516  NA 138  --   --  137  K 3.4* 4.6  --  3.6  CL 101  --   --  104  CO2 30*  --   --  26  GLUCOSE 94  --   --  102*  BUN 13  --   --  12  CREATININE 0.81  --  0.74 0.92  CALCIUM 9.8  --   --  9.0  GFRNONAA 64  --  >60 55*  GFRAA 74  --  >60 >60  ANIONGAP  --   --   --  7     Hematology Recent Labs  Lab 05/21/20 0951 05/24/20 1532 05/25/20 0516  WBC 5.7 7.2 5.9  RBC 3.75* 3.56* 3.15*  HGB 11.7 10.9* 9.9*  HCT 34.0 33.7* 30.4*  MCV 91 94.7 96.5  MCH 31.2 30.6 31.4  MCHC 34.4 32.3 32.6  RDW 13.1 13.2 13.6  PLT 190 150 149*    Radiology  CARDIAC CATHETERIZATION  Result Date: 05/24/2020  Mid LAD lesion is 90% stenosed.  Mid LAD to Dist LAD lesion is 90% stenosed.  3rd Diag lesion is 80% stenosed.  Ost Cx to Prox Cx lesion is 60% stenosed.  Prox RCA to Mid RCA lesion is 15% stenosed.  Mid RCA lesion is 95% stenosed.  Prox RCA lesion is 95% stenosed.  A drug-eluting stent was successfully placed using a SYNERGY XD [id number].  Post intervention, there is a 0% residual stenosis.  Post intervention, there is a 0% residual stenosis.  Post intervention, there is a 0% residual stenosis.  The left ventricular systolic function is normal.  LV end diastolic pressure is mildly elevated.  1. Severe 2 vessel obstructive CAD    - 90% mid LAD. 90% distal LAD at bifurcation with the third diagonal. The LAD is quite large in the mid vessel but the distal vessel is small and tortuous.    - 60% proximal LCx    - 95% proximal and mid RCA in stent restenosis 2. Normal LV function 3. Mildly elevated LVEDP 23 mm Hg 4. Successful PCI of the proximal to mid RCA using IVUS guidance, Shock wave US therapy and long DES stent  x 1 Plan: continue DAPT with ASA and Plavix indefinitely. Overnight observation with anticipated DC in am. Planned staged PCI of the mid LAD with atherectomy in a couple of weeks. Tentatively planned on July 15.    Patient Profile     86 y.o. female with a history of hypertension, hyperlipidemia, asthma, chronic diastolic heart failure, and CAD status post DES intervention to the RCA in 2015 secondary to ISR with medically managed LAD and circumflex disease.  Presents now with unstable angina.  Assessment & Plan    1.  Multivessel CAD with 90% mid and distal LAD stenoses, 60% circumflex gnosis, and 95% proximal and mid RCA in-stent restenosis now status post DES intervention to the proximal and mid RCA on July 2 by Dr. Jordan.  Plan is for staged PCI of the mid LAD with atherectomy on July 15.  Currently on aspirin, Plavix, atenolol, Imdur, and losartan.  2.  Chronic diastolic heart failure, appears euvolemic.  On HCTZ with potassium supplement, otherwise atenolol and losartan.  LVEF normal at angiography.  3.  Essential hypertension, systolics ranging 130s-140s.  4.  Mixed hyperlipidemia with statin intolerance.  Follows with Dr. Nahser.  Could consider Zetia.  Anticipate discharge home today after progression and ambulation.  Tentative plan is for staged LAD PCI by Dr. Jordan on July 15 per review of the chart.  Continue aspirin, Plavix, atenolol, HCTZ with potassium supplement, losartan, and Imdur.  Should have interval follow-up in the office for reassessment prior to next PCI.  Signed, Bailey Faiella, MD  05/25/2020, 8:36 AM   

## 2020-05-25 NOTE — Progress Notes (Signed)
CARDIAC REHAB PHASE I   PRE:  Rate/Rhythm: NSR/77  BP:  Sitting: 125/68        SaO2: 94%  MODE:  Ambulation: 220 ft HR 82  SaO2 93% RA  POST:  Rate/Rhythm: NSR/77 BP:  Sitting: 143/67      SaO2: 95%  10:00- 11:00  Pt received in bed, agrees to ambulate. Assisted to bathroom to void before walk. Pt ambulated with steady gait using rolling walker. Denies SOB, dizziness, or pain. Pt back to bed per request. Provided education to pt and daughter [name] (via phone). Provided stent card to patient. Provided information on low Na and heart healthy diet, Stressed to take Plavix and asa as directed by MD. Reviewed wound care, activity restrictions, precautions, and S/S to report to MD. Reviewed use of NTG. Referral to [facility] CRP2 made. Pt verbalized understanding.   Lorin Picket, MS, ACSM EP-C, CCRP 05/25/2020  10:50 AM

## 2020-05-25 NOTE — Discharge Instructions (Signed)
**PLEASE REMEMBER TO BRING ALL OF YOUR MEDICATIONS TO EACH OF YOUR FOLLOW-UP OFFICE VISITS. NO HEAVY LIFTING OR SEXUAL ACTIVITY X 7 DAYS. NO DRIVING X 3-5 DAYS. NO SOAKING BATHS, HOT TUBS, POOLS, ETC., X 7 DAYS.   Your provider has recommended a cardiac catherization  You are scheduled for a cardiac catheterization on 06/06/20 with Dr. Swaziland.  Please arrive at the [facility] (Main Entrance) at [facility] at [address] [place] -  2nd Floor Short Stay on 06/06/20 at 7:00am.    Special note: Every effort is made to have your procedure done on time.   Please understand that emergencies sometimes delay a scheduled   procedure.  No solid foods after midnight on 06/05/20. On the morning of your procedure, take your medications as prescribed, with one exception - please do not take your hydrochlorothiazide on the morning of your cath.   You may take your morning medications with a sip of water on the day of your procedure.  Please take a baby aspirin (81 mg) and plavix (75mg ) on the morning of your procedure.   Medications to HOLD - Hydrochlorothiazide  Plan for a one night stay -- bring personal belongings.  Bring a current list of your medications and current insurance cards.  You MUST have a responsible person to drive you home. Someone MUST be with you the first 24 hours after you arrive home or your discharge will be delayed. Wear clothes that are easy to get on and off and wear slip on shoes.    Coronary Angiogram A coronary angiogram, also called coronary angiography, is an X-ray procedure used to look at the arteries in the heart. In this procedure, a dye (contrast dye) is injected through a long, hollow tube (catheter). The catheter is about the size of a piece of cooked spaghetti and is inserted through your groin, wrist, or arm. The dye is injected into each artery, and X-rays are then taken to show if there is a blockage in the arteries of your heart.  LET [facility] CARE PROVIDER KNOW ABOUT:  Any allergies you have, including allergies to shellfish or contrast dye.    All medicines you are taking, including vitamins, herbs, eye drops, creams, and over-the-counter medicines.    Previous problems you or members of your family have had with the use of anesthetics.    Any blood disorders you have.    Previous surgeries you have had.  History of kidney problems or failure.    Other medical conditions you have.  RISKS AND COMPLICATIONS  Generally, a coronary angiogram is a safe procedure. However, about 1 person out of 1000 can have problems that may include:  Allergic reaction to the dye.  Bleeding/bruising from the access site or other locations.  Kidney injury, especially in people with impaired kidney function.   Stroke (rare).  Heart attack (rare).  Irregular rhythms (rare)  Death (rare)  BEFORE THE PROCEDURE   Do not eat or drink anything after midnight the night before the procedure or as directed by your health care provider.    Ask your health care provider about changing or stopping your regular medicines. This is especially important if you are taking diabetes medicines or blood thinners.  PROCEDURE  You may be given a medicine to help you relax (sedative) before the procedure. This medicine is given through an intravenous (IV) access tube that is inserted into one of your veins.    The area where the  catheter will be inserted will be washed and shaved. This is usually done in the groin but may be done in the fold of your arm (near your elbow) or in the wrist.     A medicine will be given to numb the area where the catheter will be inserted (local anesthetic).    The health care provider will insert the catheter into an artery. The catheter will be guided by using a special type of X-ray (fluoroscopy) of the blood vessel being examined.    A special dye will then be injected into the catheter, and X-rays will be  taken. The dye will help to show where any narrowing or blockages are located in the heart arteries.     AFTER THE PROCEDURE   If the procedure is done through the leg, you will be kept in bed lying flat for several hours. You will be instructed to not bend or cross your legs.  The insertion site will be checked frequently.    The pulse in your feet or wrist will be checked frequently.    Additional blood tests, X-rays, and an electrocardiogram may be done.   ________  Radial Site Care Refer to this sheet in the next few weeks. These instructions provide you with information on caring for yourself after your procedure. Your caregiver may also give you more specific instructions. Your treatment has been planned according to current medical practices, but problems sometimes occur. Call your caregiver if you have any problems or questions after your procedure. HOME CARE INSTRUCTIONS  You may shower the day after the procedure.Remove the bandage (dressing) and gently wash the site with plain soap and water.Gently pat the site dry.   Do not apply powder or lotion to the site.   Do not submerge the affected site in water for 3 to 5 days.   Inspect the site at least twice daily.   Do not flex or bend the affected arm for 24 hours.   No lifting over 5 pounds (2.3 kg) for 5 days after your procedure.   Do not drive home if you are discharged the same day of the procedure. Have someone else drive you.   What to expect:  Any bruising will usually fade within 1 to 2 weeks.   Blood that collects in the tissue (hematoma) may be painful to the touch. It should usually decrease in size and tenderness within 1 to 2 weeks.  SEEK IMMEDIATE MEDICAL CARE IF:  You have unusual pain at the radial site.   You have redness, warmth, swelling, or pain at the radial site.   You have drainage (other than a small amount of blood on the dressing).   You have chills.   You have a fever or persistent  symptoms for more than 72 hours.   You have a fever and your symptoms suddenly get worse.   Your arm becomes pale, cool, tingly, or numb.   You have heavy bleeding from the site. Hold pressure on the site.

## 2020-05-25 NOTE — Discharge Summary (Signed)
Discharge Summary    Patient ID: [name] MRN: [id number]; DOB: 1928-11-11  Admit date: 05/24/2020 Discharge date: 05/25/2020  Primary Care Provider: [name], NP  Primary Cardiologist: [name], MD  Primary Electrophysiologist:  None   Discharge Diagnoses    Principal Problem:   Unstable angina [facility])  **s/p PCI/DES to the RCA this admission secondary to in-stent restenosis.  **Staged intervention of the LAD pending-tentatively scheduled for June 06, 2020.  Active Problems:   CAD (coronary artery disease), native coronary artery   Essential hypertension   Hyperlipidemia   HFpEF   Asthma  Diagnostic Studies/Procedures    Cardiac Catheterization and Percutaneous Coronary Intervention 7.2.2021  Left Main  Vessel was injected. Vessel is normal in caliber. Vessel is angiographically normal.  Left Anterior Descending  Mid LAD lesion is 90% stenosed. The lesion is eccentric. The lesion is severely calcified.  Mid LAD to Dist LAD lesion is 90% stenosed.      **Pending staged intervention**  Third Diagonal Branch  3rd Diag lesion is 80% stenosed.  Left Circumflex  Ost Cx to Prox Cx lesion is 60% stenosed.  Right Coronary Artery  Prox RCA lesion is 95% stenosed. The lesion is severely calcified. Ultrasound (IVUS) was performed. Severe plaque burden was detected. IVUS has determined that the lesion is calcified.  Prox RCA to Mid RCA lesion is 15% stenosed. The lesion was previously treated using a stent (unknown type) over 2 years ago.  Mid RCA lesion is 95% stenosed. The lesion is severely calcified.      **The proximal to mid RCA was successfully treated using shockwave ultrasound therapy followed by placement of a 3.5 x 48 mm Synergy XD drug-eluting stent**  Right Posterior Descending Artery  Collaterals  RPDA filled by collaterals from Dist LAD.   Left Ventricle The left ventricular size is normal. The left ventricular systolic function is normal. LV end  diastolic pressure is mildly elevated. No regional wall motion abnormalities.  _____________   History of Present Illness     [name] is a 86 y.o. female with a history of CAD status post prior RCA stenting in 2015, hypertension, hyperlipidemia, HFpEF, and asthma.  She was recently evaluated in cardiology clinic secondary to progressive dyspnea on exertion and nitrate responsive nocturnal chest pain radiating to left arm.  Given concern for worsening angina, decision was made to pursue diagnostic catheterization.  Hospital Course     Consultants: None   Patient presented to the [facility] cardiac catheterization laboratory on May 24, 2020 and underwent diagnostic catheterization revealing severe mid LAD disease and severe in-stent restenosis in the proximal and mid RCA.  LV function was normal with mildly elevated LVEDP.  Intravascular ultrasound was performed within the right coronary artery and this demonstrated severe 360 degree calcification in the lesion.  Since this was within a stented segment, atherectomy could not be performed and shockwave ultrasound therapy was delivered followed by placement of a 3.5 x 48 mm Synergy drug-eluting stent addressing both the proximal and mid lesions.  In the setting of residual LAD disease, she will require a staged percutaneous intervention, tentatively planned for July 15.  Post RCA PCI, she has not had any recurrent chest pain.  Her right wrist catheterization site is without bleeding, bruit, or hematoma.  She has been ambulating without recurrent symptoms will be discharged home today in good condition on dual antiplatelet therapy.  Of note, she is not on a statin due to prior intolerance.  She will follow up in our office within the next 7 to 10 days, at which time we will repeat a CBC and basic metabolic panel in preparation for staged PCI July 15.  Did the patient have an acute coronary syndrome (MI, NSTEMI, STEMI, etc) this admission?:  No                                Did the patient have a percutaneous coronary intervention (stent / angioplasty)?:  Yes.     Cath/PCI Registry Performance & Quality Measures: 1. Aspirin prescribed? - Yes 2. ADP Receptor Inhibitor (Plavix/Clopidogrel, Brilinta/Ticagrelor or Effient/Prasugrel) prescribed (includes medically managed patients)? - Yes 3. High Intensity Statin (Lipitor 40-80mg  or Crestor 20-40mg ) prescribed? - No - Intolerant 4. For EF <40%, was ACEI/ARB prescribed? - Not Applicable (EF >/= 40%) 5. For EF <40%, Aldosterone Antagonist (Spironolactone or Eplerenone) prescribed? - Not Applicable (EF >/= 40%) 6. Cardiac Rehab Phase II ordered? - Yes   _____________  Discharge Vitals Blood pressure 136/60, pulse 79, temperature 99 F (37.2 C), temperature source Oral, resp. rate 18, height 4\' 9"  (1.448 m), weight 65.5 kg, SpO2 94 %.  Filed Weights   05/24/20 0547 05/25/20 0441  Weight: 60.3 kg 65.5 kg    Labs & Radiologic Studies    CBC Recent Labs    05/24/20 1532 05/25/20 0516  WBC 7.2 5.9  HGB 10.9* 9.9*  HCT 33.7* 30.4*  MCV 94.7 96.5  PLT 150 149*   Basic Metabolic Panel Recent Labs    07/26/20 0611 05/24/20 1532 05/25/20 0516  NA  --   --  137  K 4.6  --  3.6  CL  --   --  104  CO2  --   --  26  GLUCOSE  --   --  102*  BUN  --   --  12  CREATININE  --  0.74 0.92  CALCIUM  --   --  9.0  _____________   Disposition   Pt is being discharged home today in good condition.  Follow-up Plans & Appointments     Follow-up Information    [facility] Follow up on 06/06/2020.   Why: Present to the main entrance - 06/08/2020 - at [employer] for your 9am heart catheteriation. Contact information: [address] [place] [address]       [phone] T, PA-C Follow up in 1 week(s).   Specialties: Cardiology, Physician Assistant Why: We will arrange for a follow-up appointment with Scott prior to your next  procedure and contact you with date/time. Contact information: [address] [place] [phone]              Discharge Instructions    Call MD for:  difficulty breathing, headache or visual disturbances   Complete by: As directed    Call MD for:  extreme fatigue   Complete by: As directed    Call MD for:  persistant dizziness or light-headedness   Complete by: As directed    Call MD for:  redness, tenderness, or signs of infection (pain, swelling, redness, odor or green/yellow discharge around incision site)   Complete by: As directed    Call MD for:  severe uncontrolled pain   Complete by: As directed    Diet - low sodium heart healthy   Complete by: As directed    Increase activity  slowly   Complete by: As directed       Discharge Medications   Allergies as of 05/25/2020      Reactions   Codeine Cough, Other (See Comments)   Other reaction(s): Cough Other reaction(s): Cough   Crestor [rosuvastatin Calcium] Cough   Lisinopril Cough   Other reaction(s): COUGH   Pantoprazole Sodium Cough   Pantoprazole Sodium Cough   Other reaction(s): Cough   Pravachol Cough   Pravastatin Cough   Other reaction(s): Cough   Rosuvastatin Cough   Other reaction(s): Cough   Simvastatin Cough   Other reaction(s): Cough      Medication List    TAKE these medications   albuterol 108 (90 Base) MCG/ACT inhaler Commonly known as: VENTOLIN HFA Inhale 2 puffs into the lungs every 6 (six) hours as needed for wheezing or shortness of breath.   aspirin EC 81 MG tablet Take 81 mg by mouth daily.   atenolol 25 MG tablet Commonly known as: TENORMIN TAKE 2 TABLETS BY MOUTH  DAILY   Calcium+D3 600-800 MG-UNIT Tabs Generic drug: Calcium Carb-Cholecalciferol Take 1 tablet by mouth daily.   clopidogrel 75 MG tablet Commonly known as: PLAVIX TAKE 1 TABLET BY MOUTH  DAILY WITH BREAKFAST What changed: when to take this   hydrochlorothiazide 12.5 MG  capsule Commonly known as: MICROZIDE TAKE 1 CAPSULE BY MOUTH  DAILY   isosorbide mononitrate 60 MG 24 hr tablet Commonly known as: IMDUR Take 1.5 tablets (90 mg total) by mouth daily.   losartan 100 MG tablet Commonly known as: COZAAR TAKE 1 TABLET BY MOUTH  DAILY   nitroGLYCERIN 0.4 MG SL tablet Commonly known as: NITROSTAT Place 1 tablet (0.4 mg total) under the tongue every 5 (five) minutes as needed for chest pain.   potassium chloride SA 20 MEQ tablet Commonly known as: KLOR-CON Take 1 tablet (20 mEq total) by mouth daily.   VITAMIN D PO Take 1,000 Units by mouth daily.   Zinc 50 MG Tabs Take 50 mg by mouth daily.        Outstanding Labs/Studies   F/u CBC/BMET @ outpt visit prior to staged PCI 7/15.  Duration of Discharge Encounter   Greater than 30 minutes including physician time.  Signed, Nicolasa Ducking, NP 05/25/2020, 9:31 AM

## 2020-05-25 NOTE — Progress Notes (Signed)
Progress Note  Patient Name: [name] Date of Encounter: 05/25/2020  Primary Cardiologist: [name], MD  Subjective   No chest pain or shortness of breath.  No right wrist pain.  Has not been up yet to ambulate other than in the room this morning.  Inpatient Medications    Scheduled Meds: . aspirin EC  81 mg Oral Daily  . atenolol  50 mg Oral Daily  . calcium-vitamin D  1 tablet Oral Q breakfast  . cholecalciferol  1,000 Units Oral Daily  . clopidogrel  75 mg Oral Daily  . enoxaparin (LOVENOX) injection  30 mg Subcutaneous Q24H  . hydrochlorothiazide  12.5 mg Oral Daily  . isosorbide mononitrate  90 mg Oral Daily  . losartan  100 mg Oral Daily  . potassium chloride SA  20 mEq Oral Daily  . sodium chloride flush  3 mL Intravenous Q12H   Continuous Infusions: . sodium chloride     PRN Meds: sodium chloride, acetaminophen, nitroGLYCERIN, ondansetron (ZOFRAN) IV, sodium chloride flush   Vital Signs    Vitals:   05/24/20 1743 05/24/20 1934 05/25/20 0010 05/25/20 0441  BP: (!) 128/92 136/63 124/63 (!) 146/76  Pulse: 85 96 96 84  Resp:  20 17 15   Temp:  98.7 F (37.1 C) (!) 100.7 F (38.2 C) 98.4 F (36.9 C)  TempSrc:  Axillary Oral Oral  SpO2:  100% 100% 100%  Weight:    65.5 kg  Height:        Intake/Output Summary (Last 24 hours) at 05/25/2020 0836 Last data filed at 05/24/2020 2200 Gross per 24 hour  Intake 480 ml  Output 300 ml  Net 180 ml   Filed Weights   05/24/20 0547 05/25/20 0441  Weight: 60.3 kg 65.5 kg    Telemetry    Sinus rhythm.  Personally reviewed.  ECG    An ECG dated 05/25/2020 was personally reviewed today and demonstrated:  Sinus rhythm with prolonged PR interval, IVCD, nonspecific ST-T changes.  Physical Exam   GEN:  Elderly woman.  No acute distress.   Neck: No JVD. Cardiac: RRR, soft systolic murmur, no gallop.  Respiratory: Nonlabored. Clear to auscultation bilaterally. GI: Soft, nontender, bowel sounds  present. MS: No edema; No deformity.  Right radial access site stable, no hematoma or bleeding, ecchymosis present. Neuro:  Nonfocal. Psych: Alert and oriented x 3. Normal affect.  Labs    Chemistry Recent Labs  Lab 05/21/20 [phone] 05/24/20 0611 05/24/20 1532 05/25/20 0516  NA 138  --   --  137  K 3.4* 4.6  --  3.6  CL 101  --   --  104  CO2 30*  --   --  26  GLUCOSE 94  --   --  102*  BUN 13  --   --  12  CREATININE 0.81  --  0.74 0.92  CALCIUM 9.8  --   --  9.0  GFRNONAA 64  --  >60 55*  GFRAA 74  --  >60 >60  ANIONGAP  --   --   --  7     Hematology Recent Labs  Lab 05/21/20 0951 05/24/20 1532 05/25/20 0516  WBC 5.7 7.2 5.9  RBC 3.75* 3.56* 3.15*  HGB 11.7 10.9* 9.9*  HCT 34.0 33.7* 30.4*  MCV 91 94.7 96.5  MCH 31.2 30.6 31.4  MCHC 34.4 32.3 32.6  RDW 13.1 13.2 13.6  PLT 190 150 149*    Radiology  CARDIAC CATHETERIZATION  Result Date: 05/24/2020  Mid LAD lesion is 90% stenosed.  Mid LAD to Dist LAD lesion is 90% stenosed.  3rd Diag lesion is 80% stenosed.  Ost Cx to Prox Cx lesion is 60% stenosed.  Prox RCA to Mid RCA lesion is 15% stenosed.  Mid RCA lesion is 95% stenosed.  Prox RCA lesion is 95% stenosed.  A drug-eluting stent was successfully placed using a SYNERGY XD [id number].  Post intervention, there is a 0% residual stenosis.  Post intervention, there is a 0% residual stenosis.  Post intervention, there is a 0% residual stenosis.  The left ventricular systolic function is normal.  LV end diastolic pressure is mildly elevated.  1. Severe 2 vessel obstructive CAD    - 90% mid LAD. 90% distal LAD at bifurcation with the third diagonal. The LAD is quite large in the mid vessel but the distal vessel is small and tortuous.    - 60% proximal LCx    - 95% proximal and mid RCA in stent restenosis 2. Normal LV function 3. Mildly elevated LVEDP 23 mm Hg 4. Successful PCI of the proximal to mid RCA using IVUS guidance, Shock wave [place] therapy and long DES stent  x 1 Plan: continue DAPT with ASA and Plavix indefinitely. Overnight observation with anticipated DC in am. Planned staged PCI of the mid LAD with atherectomy in a couple of weeks. Tentatively planned on July 15.    Patient Profile     86 y.o. female with a history of hypertension, hyperlipidemia, asthma, chronic diastolic heart failure, and CAD status post DES intervention to the RCA in 2015 secondary to ISR with medically managed LAD and circumflex disease.  Presents now with unstable angina.  Assessment & Plan    1.  Multivessel CAD with 90% mid and distal LAD stenoses, 60% circumflex gnosis, and 95% proximal and mid RCA in-stent restenosis now status post DES intervention to the proximal and mid RCA on July 2 by Dr. [place].  Plan is for staged PCI of the mid LAD with atherectomy on July 15.  Currently on aspirin, Plavix, atenolol, Imdur, and losartan.  2.  Chronic diastolic heart failure, appears euvolemic.  On HCTZ with potassium supplement, otherwise atenolol and losartan.  LVEF normal at angiography.  3.  Essential hypertension, systolics ranging 130s-140s.  4.  Mixed hyperlipidemia with statin intolerance.  Follows with Dr. Elease Hashimoto.  Could consider Zetia.  Anticipate discharge home today after progression and ambulation.  Tentative plan is for staged LAD PCI by Dr. [place] on July 15 per review of the chart.  Continue aspirin, Plavix, atenolol, HCTZ with potassium supplement, losartan, and Imdur.  Should have interval follow-up in the office for reassessment prior to next PCI.  Signed, Nona Dell, MD  05/25/2020, 8:36 AM

## 2020-05-30 ENCOUNTER — Telehealth: Payer: Self-pay | Admitting: *Deleted

## 2020-05-30 DIAGNOSIS — I25110 Atherosclerotic heart disease of native coronary artery with unstable angina pectoris: Secondary | ICD-10-CM

## 2020-05-30 DIAGNOSIS — Z01812 Encounter for preprocedural laboratory examination: Secondary | ICD-10-CM

## 2020-05-30 NOTE — Telephone Encounter (Signed)
Call to patient to arrange pre-procedure BMP/CBC, spoke with patient's daughter, [name] [facility]). Pt will go to [facility] 06/03/20 for BMP/CBC prior to Coronary Stent Intervention 06/06/20.

## 2020-06-03 ENCOUNTER — Other Ambulatory Visit: Payer: Medicare Other | Admitting: *Deleted

## 2020-06-03 ENCOUNTER — Other Ambulatory Visit: Payer: Self-pay

## 2020-06-03 DIAGNOSIS — I25110 Atherosclerotic heart disease of native coronary artery with unstable angina pectoris: Secondary | ICD-10-CM

## 2020-06-04 ENCOUNTER — Encounter (HOSPITAL_COMMUNITY): Payer: Self-pay | Admitting: Cardiology

## 2020-06-04 LAB — BASIC METABOLIC PANEL
BUN/Creatinine Ratio: 14 (ref 12–28)
BUN: 14 mg/dL (ref 10–36)
CO2: 25 mmol/L (ref 20–29)
Calcium: 9.9 mg/dL (ref 8.7–10.3)
Chloride: 98 mmol/L (ref 96–106)
Creatinine, Ser: 1 mg/dL (ref 0.57–1.00)
GFR calc Af Amer: 57 mL/min/{1.73_m2} — ABNORMAL LOW (ref >59)
GFR calc non Af Amer: 50 mL/min/{1.73_m2} — ABNORMAL LOW (ref >59)
Glucose: 97 mg/dL (ref 65–99)
Potassium: 4.8 mmol/L (ref 3.5–5.2)
Sodium: 136 mmol/L (ref 134–144)

## 2020-06-04 LAB — CBC
Hematocrit: 32.6 % — ABNORMAL LOW (ref 34.0–46.6)
Hemoglobin: 11 g/dL — ABNORMAL LOW (ref 11.1–15.9)
MCH: 31.5 pg (ref 26.6–33.0)
MCHC: 33.7 g/dL (ref 31.5–35.7)
MCV: 93 fL (ref 79–97)
Platelets: 230 10*3/uL (ref 150–450)
RBC: 3.49 x10E6/uL — ABNORMAL LOW (ref 3.77–5.28)
RDW: 12.8 % (ref 11.7–15.4)
WBC: 5.6 10*3/uL (ref 3.4–10.8)

## 2020-06-04 NOTE — Telephone Encounter (Addendum)
Pt contacted pre-coronary stent intervention  scheduled at [facility] for: Thursday June 06, 2020 9 AM Verified arrival time and place: [facility] Main Entrance [facility]) at: 7 AM   No solid food after midnight prior to cath, clear liquids until 5 AM day of procedure.  Hold: Losartan-day before and day of procedure-GFR 50 HCTZ/KCl-day before and day of procedure -GFR 50  Except hold medications AM meds can be  taken pre-cath with sips of water including: ASA 81 mg Plavix 75 mg  Confirmed patient has responsible adult to drive home post procedure and observe 24 hours after arriving home: yes  You are allowed ONE visitor in the waiting room during your procedure. Both you and your visitor must wear a mask once you enter the hospital.      COVID-19 Pre-Screening Questions:  . In the past 7 to 10 days have you had a new cough, shortness of breath, headache, congestion, fever (100 or greater) unexplained body aches, new sore throat, or sudden loss of taste or sense of smell? no . In the past 7 to 10 days have you been around anyone with known Covid 19? No   Reviewed procedure/mask/visitor instructions, COVID-19 questions with patient's daughter (DPR), Storm Frisk.  Per pt's daughter, pt has not been vaccinated for COVID-19.

## 2020-06-06 ENCOUNTER — Other Ambulatory Visit: Payer: Self-pay

## 2020-06-06 ENCOUNTER — Ambulatory Visit (HOSPITAL_COMMUNITY)
Admission: RE | Admit: 2020-06-06 | Discharge: 2020-06-07 | Disposition: A | Discharge status: Home / Self Care | Payer: Medicare Other | Attending: Cardiology | Admitting: Cardiology

## 2020-06-06 ENCOUNTER — Encounter (HOSPITAL_COMMUNITY)
Admission: RE | Disposition: A | Discharge status: Home / Self Care | Payer: Self-pay | Source: Home / Self Care | Attending: Cardiology

## 2020-06-06 DIAGNOSIS — I2510 Atherosclerotic heart disease of native coronary artery without angina pectoris: Secondary | ICD-10-CM | POA: Diagnosis present

## 2020-06-06 DIAGNOSIS — I5032 Chronic diastolic (congestive) heart failure: Secondary | ICD-10-CM

## 2020-06-06 DIAGNOSIS — J45909 Unspecified asthma, uncomplicated: Secondary | ICD-10-CM | POA: Insufficient documentation

## 2020-06-06 DIAGNOSIS — I209 Angina pectoris, unspecified: Secondary | ICD-10-CM | POA: Insufficient documentation

## 2020-06-06 DIAGNOSIS — Z7902 Long term (current) use of antithrombotics/antiplatelets: Secondary | ICD-10-CM | POA: Insufficient documentation

## 2020-06-06 DIAGNOSIS — I25119 Atherosclerotic heart disease of native coronary artery with unspecified angina pectoris: Secondary | ICD-10-CM

## 2020-06-06 DIAGNOSIS — I110 Hypertensive heart disease with heart failure: Secondary | ICD-10-CM | POA: Diagnosis not present

## 2020-06-06 DIAGNOSIS — E782 Mixed hyperlipidemia: Secondary | ICD-10-CM | POA: Diagnosis not present

## 2020-06-06 DIAGNOSIS — Z7982 Long term (current) use of aspirin: Secondary | ICD-10-CM | POA: Insufficient documentation

## 2020-06-06 DIAGNOSIS — Z79899 Other long term (current) drug therapy: Secondary | ICD-10-CM | POA: Diagnosis not present

## 2020-06-06 DIAGNOSIS — I5042 Chronic combined systolic (congestive) and diastolic (congestive) heart failure: Secondary | ICD-10-CM

## 2020-06-06 DIAGNOSIS — I5043 Acute on chronic combined systolic (congestive) and diastolic (congestive) heart failure: Secondary | ICD-10-CM

## 2020-06-06 DIAGNOSIS — Z955 Presence of coronary angioplasty implant and graft: Secondary | ICD-10-CM | POA: Insufficient documentation

## 2020-06-06 DIAGNOSIS — I200 Unstable angina: Secondary | ICD-10-CM | POA: Diagnosis present

## 2020-06-06 DIAGNOSIS — I10 Essential (primary) hypertension: Secondary | ICD-10-CM | POA: Diagnosis present

## 2020-06-06 DIAGNOSIS — I25110 Atherosclerotic heart disease of native coronary artery with unstable angina pectoris: Secondary | ICD-10-CM | POA: Insufficient documentation

## 2020-06-06 DIAGNOSIS — R079 Chest pain, unspecified: Secondary | ICD-10-CM | POA: Insufficient documentation

## 2020-06-06 DIAGNOSIS — E785 Hyperlipidemia, unspecified: Secondary | ICD-10-CM | POA: Diagnosis present

## 2020-06-06 HISTORY — PX: CORONARY ATHERECTOMY: CATH118238

## 2020-06-06 HISTORY — PX: CORONARY STENT INTERVENTION: CATH118234

## 2020-06-06 HISTORY — DX: Chest pain, unspecified: R07.9

## 2020-06-06 LAB — POCT ACTIVATED CLOTTING TIME
Activated Clotting Time: 224 seconds
Activated Clotting Time: 252 seconds
Activated Clotting Time: 346 seconds

## 2020-06-06 SURGERY — CORONARY STENT INTERVENTION
Anesthesia: LOCAL

## 2020-06-06 MED ORDER — LIDOCAINE HCL (PF) 1 % IJ SOLN
INTRAMUSCULAR | Status: AC
  Filled 2020-06-06: qty 30

## 2020-06-06 MED ORDER — VERAPAMIL HCL 2.5 MG/ML IV SOLN
INTRAVENOUS | Status: DC | PRN
  Administered 2020-06-06: 10 mL via INTRA_ARTERIAL

## 2020-06-06 MED ORDER — HEPARIN (PORCINE) IN NACL 1000-0.9 UT/500ML-% IV SOLN
INTRAVENOUS | Status: DC | PRN
  Administered 2020-06-06 (×2): 500 mL

## 2020-06-06 MED ORDER — SODIUM CHLORIDE 0.9% FLUSH: 3.0000 mL | INTRAVENOUS | Status: DC | PRN

## 2020-06-06 MED ORDER — SODIUM CHLORIDE 0.9 % IV SOLN: 250.0000 mL | INTRAVENOUS | Status: DC | PRN

## 2020-06-06 MED ORDER — VIPERSLIDE LUBRICANT OPTIME
TOPICAL | Status: DC | PRN
  Administered 2020-06-06: 20 mL via SURGICAL_CAVITY

## 2020-06-06 MED ORDER — IOHEXOL 350 MG/ML SOLN
INTRAVENOUS | Status: DC | PRN
  Administered 2020-06-06: 125 mL

## 2020-06-06 MED ORDER — FENTANYL CITRATE (PF) 100 MCG/2ML IJ SOLN
INTRAMUSCULAR | Status: DC | PRN
  Administered 2020-06-06 (×2): 25 ug via INTRAVENOUS

## 2020-06-06 MED ORDER — LABETALOL HCL 5 MG/ML IV SOLN: 10.0000 mg | INTRAVENOUS | Status: AC | PRN

## 2020-06-06 MED ORDER — LOSARTAN POTASSIUM 50 MG PO TABS
100.0000 mg | ORAL_TABLET | Freq: Every day | ORAL | Status: DC
  Administered 2020-06-07: 100 mg via ORAL
  Filled 2020-06-06 (×2): qty 2

## 2020-06-06 MED ORDER — ONDANSETRON HCL 4 MG/2ML IJ SOLN: 4.0000 mg | Freq: Four times a day (QID) | INTRAMUSCULAR | Status: DC | PRN

## 2020-06-06 MED ORDER — SODIUM CHLORIDE 0.9% FLUSH
3.0000 mL | Freq: Two times a day (BID) | INTRAVENOUS | Status: DC
  Administered 2020-06-07: 3 mL via INTRAVENOUS

## 2020-06-06 MED ORDER — CLOPIDOGREL BISULFATE 75 MG PO TABS
75.0000 mg | ORAL_TABLET | Freq: Every day | ORAL | Status: DC
  Administered 2020-06-07: 75 mg via ORAL
  Filled 2020-06-06: qty 1

## 2020-06-06 MED ORDER — POTASSIUM CHLORIDE CRYS ER 20 MEQ PO TBCR
20.0000 meq | EXTENDED_RELEASE_TABLET | Freq: Every day | ORAL | Status: DC
  Administered 2020-06-07: 20 meq via ORAL
  Filled 2020-06-06 (×2): qty 1

## 2020-06-06 MED ORDER — HEPARIN SODIUM (PORCINE) 1000 UNIT/ML IJ SOLN
INTRAMUSCULAR | Status: DC | PRN
  Administered 2020-06-06: 3000 [IU] via INTRAVENOUS
  Administered 2020-06-06: 6000 [IU] via INTRAVENOUS
  Administered 2020-06-06: 2000 [IU] via INTRAVENOUS

## 2020-06-06 MED ORDER — VERAPAMIL HCL 2.5 MG/ML IV SOLN
INTRAVENOUS | Status: AC
  Filled 2020-06-06: qty 2

## 2020-06-06 MED ORDER — HEPARIN (PORCINE) IN NACL 1000-0.9 UT/500ML-% IV SOLN
INTRAVENOUS | Status: AC
  Filled 2020-06-06: qty 1000

## 2020-06-06 MED ORDER — HYDROCHLOROTHIAZIDE 12.5 MG PO CAPS
12.5000 mg | ORAL_CAPSULE | Freq: Every day | ORAL | Status: DC
  Administered 2020-06-07: 12.5 mg via ORAL
  Filled 2020-06-06 (×2): qty 1

## 2020-06-06 MED ORDER — ZINC SULFATE 220 (50 ZN) MG PO CAPS
220.0000 mg | ORAL_CAPSULE | Freq: Every day | ORAL | Status: DC
  Administered 2020-06-07: 220 mg via ORAL
  Filled 2020-06-06: qty 1

## 2020-06-06 MED ORDER — ASPIRIN 81 MG PO CHEW: 81.0000 mg | CHEWABLE_TABLET | ORAL | Status: DC

## 2020-06-06 MED ORDER — SODIUM CHLORIDE 0.9 % WEIGHT BASED INFUSION: 1.0000 mL/kg/h | INTRAVENOUS | Status: DC

## 2020-06-06 MED ORDER — ATENOLOL 50 MG PO TABS
50.0000 mg | ORAL_TABLET | Freq: Every day | ORAL | Status: DC
  Administered 2020-06-07: 50 mg via ORAL
  Filled 2020-06-06: qty 2
  Filled 2020-06-06 (×2): qty 1
  Filled 2020-06-06: qty 2

## 2020-06-06 MED ORDER — HYDRALAZINE HCL 20 MG/ML IJ SOLN
INTRAMUSCULAR | Status: DC | PRN
  Administered 2020-06-06: 10 mg via INTRAVENOUS

## 2020-06-06 MED ORDER — ZINC 50 MG PO TABS: 50.0000 mg | ORAL_TABLET | Freq: Every day | ORAL | Status: DC

## 2020-06-06 MED ORDER — VIPERSLIDE LUBRICANT OPTIME: TOPICAL | Status: DC | PRN

## 2020-06-06 MED ORDER — VITAMIN D 25 MCG (1000 UNIT) PO TABS
1000.0000 [IU] | ORAL_TABLET | Freq: Every day | ORAL | Status: DC
  Administered 2020-06-07: 1000 [IU] via ORAL
  Filled 2020-06-06 (×2): qty 1

## 2020-06-06 MED ORDER — HEPARIN SODIUM (PORCINE) 1000 UNIT/ML IJ SOLN
INTRAMUSCULAR | Status: AC
  Filled 2020-06-06: qty 1

## 2020-06-06 MED ORDER — SODIUM CHLORIDE 0.9 % WEIGHT BASED INFUSION
1.0000 mL/kg/h | INTRAVENOUS | Status: AC
  Administered 2020-06-06: 1 mL/kg/h via INTRAVENOUS

## 2020-06-06 MED ORDER — NITROGLYCERIN 1 MG/10 ML FOR IR/CATH LAB
INTRA_ARTERIAL | Status: DC | PRN
  Administered 2020-06-06 (×2): 200 ug via INTRACORONARY

## 2020-06-06 MED ORDER — CALCIUM CARB-CHOLECALCIFEROL 600-800 MG-UNIT PO TABS: 1.0000 | ORAL_TABLET | Freq: Every day | ORAL | Status: DC

## 2020-06-06 MED ORDER — MIDAZOLAM HCL 2 MG/2ML IJ SOLN
INTRAMUSCULAR | Status: AC
  Filled 2020-06-06: qty 2

## 2020-06-06 MED ORDER — CALCIUM CARBONATE-VITAMIN D 500-200 MG-UNIT PO TABS
1.0000 | ORAL_TABLET | Freq: Every day | ORAL | Status: DC
  Administered 2020-06-07: 1 via ORAL
  Filled 2020-06-06: qty 1

## 2020-06-06 MED ORDER — ONDANSETRON HCL 4 MG/2ML IJ SOLN
INTRAMUSCULAR | Status: DC | PRN
  Administered 2020-06-06: 4 mg via INTRAVENOUS

## 2020-06-06 MED ORDER — ONDANSETRON HCL 4 MG/2ML IJ SOLN
INTRAMUSCULAR | Status: AC
  Filled 2020-06-06: qty 2

## 2020-06-06 MED ORDER — HYDRALAZINE HCL 20 MG/ML IJ SOLN: 10.0000 mg | INTRAMUSCULAR | Status: AC | PRN

## 2020-06-06 MED ORDER — LIDOCAINE HCL (PF) 1 % IJ SOLN
INTRAMUSCULAR | Status: DC | PRN
  Administered 2020-06-06: 2 mL

## 2020-06-06 MED ORDER — NITROGLYCERIN 1 MG/10 ML FOR IR/CATH LAB
INTRA_ARTERIAL | Status: AC
  Filled 2020-06-06: qty 10

## 2020-06-06 MED ORDER — MIDAZOLAM HCL 2 MG/2ML IJ SOLN
INTRAMUSCULAR | Status: DC | PRN
  Administered 2020-06-06 (×2): 1 mg via INTRAVENOUS

## 2020-06-06 MED ORDER — ISOSORBIDE MONONITRATE ER 60 MG PO TB24
90.0000 mg | ORAL_TABLET | Freq: Every day | ORAL | Status: DC
  Administered 2020-06-07: 90 mg via ORAL
  Filled 2020-06-06: qty 1

## 2020-06-06 MED ORDER — ASPIRIN EC 81 MG PO TBEC
81.0000 mg | DELAYED_RELEASE_TABLET | Freq: Every day | ORAL | Status: DC
  Administered 2020-06-07: 81 mg via ORAL
  Filled 2020-06-06: qty 1

## 2020-06-06 MED ORDER — ACETAMINOPHEN 325 MG PO TABS: 650.0000 mg | ORAL_TABLET | ORAL | Status: DC | PRN

## 2020-06-06 MED ORDER — HYDRALAZINE HCL 20 MG/ML IJ SOLN
INTRAMUSCULAR | Status: AC
  Filled 2020-06-06: qty 1

## 2020-06-06 MED ORDER — FENTANYL CITRATE (PF) 100 MCG/2ML IJ SOLN
INTRAMUSCULAR | Status: AC
  Filled 2020-06-06: qty 2

## 2020-06-06 MED ORDER — SODIUM CHLORIDE 0.9 % WEIGHT BASED INFUSION
3.0000 mL/kg/h | INTRAVENOUS | Status: DC
  Administered 2020-06-06: 3 mL/kg/h via INTRAVENOUS

## 2020-06-06 MED ORDER — NITROGLYCERIN 0.4 MG SL SUBL: 0.4000 mg | SUBLINGUAL_TABLET | SUBLINGUAL | Status: DC | PRN

## 2020-06-06 SURGICAL SUPPLY — 28 items
BALLN SAPPHIRE 2.5X15 (BALLOONS) ×2
BALLN SAPPHIRE ~~LOC~~ 2.5X12 (BALLOONS) ×1 IMPLANT
BALLN SAPPHIRE ~~LOC~~ 3.25X15 (BALLOONS) ×1 IMPLANT
BALLN WOLVERINE 3.00X10 (BALLOONS) ×2
BALLOON SAPPHIRE 2.5X15 (BALLOONS) IMPLANT
BALLOON WOLVERINE 3.00X10 (BALLOONS) IMPLANT
CATH SHOCKWAVE 3.0X12 (CATHETERS) IMPLANT
CATH TELESCOPE 6F GEC (CATHETERS) ×1 IMPLANT
CATH VISTA GUIDE 6FR XBLAD3.5 (CATHETERS) ×1 IMPLANT
CATHETER SHOCKWAVE 3.0X12 (CATHETERS) ×2
COVER SWIFTLINK CONNECTOR (BAG) ×1 IMPLANT
CROWN DIAMONDBACK CLASSIC 1.25 (BURR) ×1 IMPLANT
DEVICE RAD TR BAND REGULAR (VASCULAR PRODUCTS) ×1 IMPLANT
ELECT DEFIB PAD ADLT CADENCE (PAD) ×1 IMPLANT
GLIDESHEATH SLEND SS 6F .021 (SHEATH) ×1 IMPLANT
GUIDEWIRE INQWIRE 1.5J.035X260 (WIRE) IMPLANT
INQWIRE 1.5J .035X260CM (WIRE) ×2
KIT ENCORE 26 ADVANTAGE (KITS) ×1 IMPLANT
KIT HEART LEFT (KITS) ×2 IMPLANT
LUBRICANT VIPERSLIDE CORONARY (MISCELLANEOUS) ×1 IMPLANT
PACK CARDIAC CATHETERIZATION (CUSTOM PROCEDURE TRAY) ×2 IMPLANT
SHEATH PROBE COVER 6X72 (BAG) ×1 IMPLANT
STENT RESOLUTE ONYX 3.0X22 (Permanent Stent) ×1 IMPLANT
TRANSDUCER W/STOPCOCK (MISCELLANEOUS) ×2 IMPLANT
TUBING CIL FLEX 10 FLL-RA (TUBING) ×2 IMPLANT
WIRE ASAHI PROWATER 180CM (WIRE) ×1 IMPLANT
WIRE HI TORQ VERSACORE-J 145CM (WIRE) ×1 IMPLANT
WIRE VIPERWIRE COR FLEX .012 (WIRE) ×1 IMPLANT

## 2020-06-06 NOTE — Interval H&P Note (Signed)
History and Physical Interval Note:  06/06/2020 8:49 AM  [name]  has presented today for surgery, with the diagnosis of CAD.  The various methods of treatment have been discussed with the patient and family. After consideration of risks, benefits and other options for treatment, the patient has consented to  Procedure(s): CORONARY STENT INTERVENTION (N/A) CORONARY ATHERECTOMY (N/A) as a surgical intervention.  The patient's history has been reviewed, patient examined, no change in status, stable for surgery.  I have reviewed the patient's chart and labs.  Questions were answered to the patient's satisfaction.   Cath Lab Visit (complete for each Cath Lab visit)  Clinical Evaluation Leading to the Procedure:   ACS: No.  Non-ACS:    Anginal Classification: CCS III  Anti-ischemic medical therapy: Maximal Therapy (2 or more classes of medications)  Non-Invasive Test Results: No non-invasive testing performed  Prior CABG: No previous CABG        Theron Arista [facility] 06/06/2020 8:49 AM

## 2020-06-06 NOTE — Progress Notes (Signed)
Pt and daughters refused to take BP meds d/t the  Last two times she was in for a PCI her BP dropped and the DR changed the dose on one of her BP meds (pt and daughters are unaware of the name of the med). This nurse looked in this admissions notes along with the previous admit notes and was unable to find a medication that indicated the Dr reduced the dose just for the day of the PCI. When this nurse brought in her meds, she refused her BP meds, and stated she would just take all of her meds in the morning, which included her vit d and potassium. This nurse paged team c, PA twice before the 5pm hour and no response back and also paged the on-call PA with no response back.
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
## 2020-06-06 NOTE — Discharge Instructions (Signed)
Post Cardiac Catheterization: NO HEAVY LIFTING OR SEXUAL ACTIVITY X 7 DAYS. NO DRIVING X 3-5 DAYS. NO SOAKING BATHS, HOT TUBS, POOLS, ETC., X 7 DAYS.  Radial Site Care: Refer to this sheet in the next few weeks. These instructions provide you with information on caring for yourself after your procedure. Your caregiver may also give you more specific instructions. Your treatment has been planned according to current medical practices, but problems sometimes occur. Call your caregiver if you have any problems or questions after your procedure. HOME CARE INSTRUCTIONS  You may shower the day after the procedure.Remove the bandage (dressing) and gently wash the site with plain soap and water.Gently pat the site dry.   Do not apply powder or lotion to the site.   Do not submerge the affected site in water for 3 to 5 days.   Inspect the site at least twice daily.   Do not flex or bend the affected arm for 24 hours.   No lifting over 5 pounds (2.3 kg) for 5 days after your procedure.   Do not drive home if you are discharged the same day of the procedure. Have someone else drive you.  What to expect:  Any bruising will usually fade within 1 to 2 weeks.   Blood that collects in the tissue (hematoma) may be painful to the touch. It should usually decrease in size and tenderness within 1 to 2 weeks.  SEEK IMMEDIATE MEDICAL CARE IF:  You have unusual pain at the radial site.   You have redness, warmth, swelling, or pain at the radial site.   You have drainage (other than a small amount of blood on the dressing).   You have chills.   You have a fever or persistent symptoms for more than 72 hours.   You have a fever and your symptoms suddenly get worse.   Your arm becomes pale, cool, tingly, or numb.   You have heavy bleeding from the site. Hold pressure on the site.    

## 2020-06-07 ENCOUNTER — Ambulatory Visit: Payer: Medicare Other | Admitting: Physician Assistant

## 2020-06-07 ENCOUNTER — Encounter (HOSPITAL_COMMUNITY): Payer: Self-pay | Admitting: Cardiology

## 2020-06-07 ENCOUNTER — Other Ambulatory Visit: Payer: Medicare Other

## 2020-06-07 DIAGNOSIS — I110 Hypertensive heart disease with heart failure: Secondary | ICD-10-CM | POA: Diagnosis not present

## 2020-06-07 DIAGNOSIS — I200 Unstable angina: Secondary | ICD-10-CM | POA: Diagnosis not present

## 2020-06-07 DIAGNOSIS — I5032 Chronic diastolic (congestive) heart failure: Secondary | ICD-10-CM

## 2020-06-07 DIAGNOSIS — I5042 Chronic combined systolic (congestive) and diastolic (congestive) heart failure: Secondary | ICD-10-CM

## 2020-06-07 DIAGNOSIS — E782 Mixed hyperlipidemia: Secondary | ICD-10-CM | POA: Diagnosis not present

## 2020-06-07 DIAGNOSIS — I5043 Acute on chronic combined systolic (congestive) and diastolic (congestive) heart failure: Secondary | ICD-10-CM

## 2020-06-07 DIAGNOSIS — I25110 Atherosclerotic heart disease of native coronary artery with unstable angina pectoris: Secondary | ICD-10-CM | POA: Diagnosis not present

## 2020-06-07 LAB — CBC
HCT: 33.6 % — ABNORMAL LOW (ref 36.0–46.0)
Hemoglobin: 11.1 g/dL — ABNORMAL LOW (ref 12.0–15.0)
MCH: 31.5 pg (ref 26.0–34.0)
MCHC: 33 g/dL (ref 30.0–36.0)
MCV: 95.5 fL (ref 80.0–100.0)
Platelets: 198 10*3/uL (ref 150–400)
RBC: 3.52 MIL/uL — ABNORMAL LOW (ref 3.87–5.11)
RDW: 13.2 % (ref 11.5–15.5)
WBC: 5.4 10*3/uL (ref 4.0–10.5)
nRBC: 0 % (ref 0.0–0.2)

## 2020-06-07 LAB — BASIC METABOLIC PANEL
Anion gap: 8 (ref 5–15)
BUN: 9 mg/dL (ref 8–23)
CO2: 28 mmol/L (ref 22–32)
Calcium: 9.2 mg/dL (ref 8.9–10.3)
Chloride: 101 mmol/L (ref 98–111)
Creatinine, Ser: 0.87 mg/dL (ref 0.44–1.00)
GFR calc Af Amer: >60 mL/min (ref >60)
GFR calc non Af Amer: 59 mL/min — ABNORMAL LOW (ref >60)
Glucose, Bld: 103 mg/dL — ABNORMAL HIGH (ref 70–99)
Potassium: 4.5 mmol/L (ref 3.5–5.1)
Sodium: 137 mmol/L (ref 135–145)
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
## 2020-06-07 NOTE — Discharge Summary (Addendum)
Discharge Summary    Patient ID: Cynthia Rumpfnnie E Hossain MRN: [id number]; DOB: 08/01/1928  Admit date: 06/06/2020 Discharge date: 06/07/2020  Primary Care Provider: Anne NgNche, Charlotte Lum, NP  Primary Cardiologist: Kristeen MissPhilip Nahser, MD  Primary Electrophysiologist:  None   Discharge Diagnoses    Principal Problem:   Progressive angina [facility](HCC) Active Problems:   CAD (coronary artery disease), native coronary artery   Hyperlipidemia   Essential hypertension   Chronic diastolic CHF (congestive heart failure) (HCC)    Diagnostic Studies/Procedures    Coronary Stent Intervention 06/06/2020:  Mid LAD lesion is 90% stenosed.  Post intervention, there is a 0% residual stenosis.  A drug-eluting stent was successfully placed using a STENT RESOLUTE ONYX 3.0X22.   1. Successful PCI of the mid LAD with orbital atherectomy, Cutting balloon angioplasty, Shockwave therapy and DES x 1.  Plan: DAPT for at least one year- and probably indefinitely given multiple stent layers in RCA. Will observe overnight and anticipate DC in am.   Diagnostic Dominance: Right  Intervention     _____________   History of Present Illness     [name] is a 86 y.o. female with a history of CAD with prior stenting to RCA and DES to in-stent restenosis of RCA in 2015, chronic diastolic CHF, hypertension, hyperlipidemia, and asthma who is followed by Dr. Elease HashimotoNahser. She was seen by Tereso NewcomerScott Weaver, PA-C, on 05/21/2020 after calling our office with reports of increased amounts of chest pain requiring Nitro. At that visit, she reported worsening shortness of breath with exertion over the past several months. She also reported waking up in the middle of the night with left-sided chest discomfort radiating to her left arm. She would take Nitro with prompt relief. No associated diaphoresis, nausea, syncope, orthopnea, or significant lower extremity swelling. She also noted recent episodes of transient confusion and is being  worked up by Neurology. Brain MRI in 02/2020 showed small vessel ischemic disease and cerebral atrophy but no acute abnormalities. Symptoms concerning for unstable angina. Patient discussed with Dr. Elease HashimotoNahser and decision made to proceed with cardiac catheterization. Patient underwent cardiac catheterization on 05/24/2020 showed severe 2 vessel CAD with 90% stenosis of mid to distal LAD followed by another 90% stenosis of mid to distal LAD, 80% stenosis of 3rd Diag, and 95% stenosis of proximal RCA followed by another 95% stenosis of mid RCA with in-stent restenosis of prior stents. Patient underwent successful PCI of the proximal to mid RCA using IVUS guidance, shock wave US therapy and a long DES stent with plans for patient to return staged PCI of mid LAD with atherectomy on 06/06/2020.    Hospital Course     Consultants: None  Patient presented for staged PCI of mid LAD on 06/06/2020. She underwent successful PCI of the mid LAD with orbital atherectomy, cutting balloon angioplasty, shockwave therapy, and DES x1. Patient admitted overnight for observation. Patient tolerated procedure well. Right radial cath site soft with no signs of hematoma. Renal function stable. Will have Cardiac Rehab see patient prior to discharge. Continue dual antiplatelet therapy with Aspirin and Plavix. No changes to home medications.  Patient seen and examined by Dr. Clifton JamesMcAlhany and determined to be stable for discharge. Outpatient follow-up arranged. Medications as below.  Did the patient have an acute coronary syndrome (MI, NSTEMI, STEMI, etc) this admission?:  No  Did the patient have a percutaneous coronary intervention (stent / angioplasty)?:  Yes.     Cath/PCI Registry Performance & Quality Measures: 1. Aspirin prescribed? - Yes 2. ADP Receptor Inhibitor (Plavix/Clopidogrel, Brilinta/Ticagrelor or Effient/Prasugrel) prescribed (includes medically managed patients)? - Yes 3. High Intensity  Statin (Lipitor 40-80mg  or Crestor 20-40mg ) prescribed? - No - Intolerant. 4. For EF <40%, was ACEI/ARB prescribed? - Not Applicable (EF >/= 40%) 5. For EF <40%, Aldosterone Antagonist (Spironolactone or Eplerenone) prescribed? - Not Applicable (EF >/= 40%) 6. Cardiac Rehab Phase II ordered? - Yes   _____________  Discharge Vitals Blood pressure 130/68, pulse 72, temperature 99.2 F (37.3 C), temperature source Oral, resp. rate 16, height 4\' 9"  (1.448 m), weight 59.8 kg, SpO2 96 %.  Filed Weights   06/06/20 0648 06/07/20 0448  Weight: 58.3 kg 59.8 kg   General: 86 y.o. female resting comfortably in no acute distress.  HEENT: Normocephalic and atraumatic. Sclera clear.  Neck: Supple.  Heart: RRR. Distinct S1 and S2. No murmurs, gallops, or rubs. Radial pulses 2+ and equal bilaterally. Right radial cath site soft with no signs of hematoma. Lungs: No increased work of breathing. Clear to ausculation bilaterally. No wheezes, rhonchi, or rales.  Abdomen: Soft, non-distended, and non-tender to palpation.  Extremities: No lower extremity edema.    Skin: Warm and dry. Neuro: No focal deficits. Psych: Normal affect. Responds appropriately.  Labs & Radiologic Studies    CBC Recent Labs    06/07/20 0314  WBC 5.4  HGB 11.1*  HCT 33.6*  MCV 95.5  PLT 198   Basic Metabolic Panel Recent Labs    06/09/20 0314  NA 137  K 4.5  CL 101  CO2 28  GLUCOSE 103*  BUN 9  CREATININE 0.87  CALCIUM 9.2   Liver Function Tests No results for input(s): AST, ALT, ALKPHOS, BILITOT, PROT, ALBUMIN in the last 72 hours. No results for input(s): LIPASE, AMYLASE in the last 72 hours. High Sensitivity Troponin:   No results for input(s): TROPONINIHS in the last 720 hours.  BNP Invalid input(s): POCBNP D-Dimer No results for input(s): DDIMER in the last 72 hours. Hemoglobin A1C No results for input(s): HGBA1C in the last 72 hours. Fasting Lipid Panel No results for input(s): CHOL, HDL, LDLCALC,  TRIG, CHOLHDL, LDLDIRECT in the last 72 hours. Thyroid Function Tests No results for input(s): TSH, T4TOTAL, T3FREE, THYROIDAB in the last 72 hours.  Invalid input(s): FREET3 _____________  CARDIAC CATHETERIZATION  Result Date: 06/06/2020  Mid LAD lesion is 90% stenosed.  Post intervention, there is a 0% residual stenosis.  A drug-eluting stent was successfully placed using a STENT RESOLUTE ONYX 3.0X22.  1. Successful PCI of the mid LAD with orbital atherectomy, Cutting balloon angioplasty, Shockwave therapy and DES x 1. Plan: DAPT for at least one year- and probably indefinitely given multiple stent layers in RCA. Will observe overnight and anticipate DC in am.   CARDIAC CATHETERIZATION  Result Date: 05/24/2020  Mid LAD lesion is 90% stenosed.  Mid LAD to Dist LAD lesion is 90% stenosed.  3rd Diag lesion is 80% stenosed.  Ost Cx to Prox Cx lesion is 60% stenosed.  Prox RCA to Mid RCA lesion is 15% stenosed.  Mid RCA lesion is 95% stenosed.  Prox RCA lesion is 95% stenosed.  A drug-eluting stent was successfully placed using a SYNERGY XD 3.50X48.  Post intervention, there is a 0% residual stenosis.  Post intervention, there is a 0% residual stenosis.  Post intervention, there is  a 0% residual stenosis.  The left ventricular systolic function is normal.  LV end diastolic pressure is mildly elevated.  1. Severe 2 vessel obstructive CAD    - 90% mid LAD. 90% distal LAD at bifurcation with the third diagonal. The LAD is quite large in the mid vessel but the distal vessel is small and tortuous.    - 60% proximal LCx    - 95% proximal and mid RCA in stent restenosis 2. Normal LV function 3. Mildly elevated LVEDP 23 mm Hg 4. Successful PCI of the proximal to mid RCA using IVUS guidance, Shock wave [place] therapy and long DES stent x 1 Plan: continue DAPT with ASA and Plavix indefinitely. Overnight observation with anticipated DC in am. Planned staged PCI of the mid LAD with atherectomy in a couple of  weeks. Tentatively planned on July 15.   Disposition   Patient is being discharged home today in good condition.  Follow-up Plans & Appointments     Follow-up Information    Beatrice Lecher, PA-C Follow up.   Specialties: Cardiology, Physician Assistant Why: Hospital follow-up scheduled for 06/12/2020 at 8:45am with Tereso Newcomer, PA-C. Please arrive 15 minutes early for check-in. If this date/time does not work for you, please call our office to reschedule. Contact information: [address] [place] [address] [phone]              Discharge Instructions    Diet - low sodium heart healthy   Complete by: As directed    Increase activity slowly   Complete by: As directed       Discharge Medications   Allergies as of 06/07/2020      Reactions   Codeine Cough, Other (See Comments)   Other reaction(s): Cough Other reaction(s): Cough   Crestor [rosuvastatin Calcium] Cough   Lisinopril Cough   Other reaction(s): COUGH   Pantoprazole Sodium Cough   Pantoprazole Sodium Cough   Other reaction(s): Cough   Pravachol Cough   Pravastatin Cough   Other reaction(s): Cough   Rosuvastatin Cough   Other reaction(s): Cough   Simvastatin Cough   Other reaction(s): Cough      Medication List    TAKE these medications   albuterol 108 (90 Base) MCG/ACT inhaler Commonly known as: VENTOLIN HFA Inhale 2 puffs into the lungs every 6 (six) hours as needed for wheezing or shortness of breath.   aspirin EC 81 MG tablet Take 81 mg by mouth daily.   atenolol 25 MG tablet Commonly known as: TENORMIN TAKE 2 TABLETS BY MOUTH  DAILY   Calcium+D3 600-800 MG-UNIT Tabs Generic drug: Calcium Carb-Cholecalciferol Take 1 tablet by mouth daily.   clopidogrel 75 MG tablet Commonly known as: PLAVIX TAKE 1 TABLET BY MOUTH  DAILY WITH BREAKFAST What changed: when to take this   hydrochlorothiazide 12.5 MG capsule Commonly known as: MICROZIDE TAKE 1 CAPSULE BY  MOUTH  DAILY   isosorbide mononitrate 60 MG 24 hr tablet Commonly known as: IMDUR Take 1.5 tablets (90 mg total) by mouth daily.   losartan 100 MG tablet Commonly known as: COZAAR TAKE 1 TABLET BY MOUTH  DAILY   nitroGLYCERIN 0.4 MG SL tablet Commonly known as: NITROSTAT Place 1 tablet (0.4 mg total) under the tongue every 5 (five) minutes as needed for chest pain.   potassium chloride SA 20 MEQ tablet Commonly known as: KLOR-CON Take 1 tablet (20 mEq total) by mouth daily.   VITAMIN D PO Take 1,000 Units  by mouth daily.   Zinc 50 MG Tabs Take 50 mg by mouth daily.          Outstanding Labs/Studies   N/A  Duration of Discharge Encounter   Greater than 30 minutes including physician time.  Signed, Corrin Parker, PA-C 06/07/2020, 9:11 AM  I have personally seen and examined this patient. I agree with the assessment and plan as outlined above.  Pt doing well post PCI. No chest pain. Discharge home today on ASA and Plavix.   Verne Carrow 06/07/2020 10:09 AM
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
## 2020-06-12 ENCOUNTER — Other Ambulatory Visit: Payer: Self-pay

## 2020-06-12 ENCOUNTER — Ambulatory Visit: Payer: Medicare Other | Admitting: Physician Assistant

## 2020-06-12 ENCOUNTER — Encounter: Payer: Self-pay | Admitting: Physician Assistant

## 2020-06-12 VITALS — BP 140/70 | HR 69 | Ht <= 58 in | Wt 131.0 lb

## 2020-06-12 DIAGNOSIS — I5032 Chronic diastolic (congestive) heart failure: Secondary | ICD-10-CM

## 2020-06-12 DIAGNOSIS — E782 Mixed hyperlipidemia: Secondary | ICD-10-CM

## 2020-06-12 DIAGNOSIS — I10 Essential (primary) hypertension: Secondary | ICD-10-CM | POA: Diagnosis not present

## 2020-06-12 DIAGNOSIS — I2510 Atherosclerotic heart disease of native coronary artery without angina pectoris: Secondary | ICD-10-CM | POA: Diagnosis not present

## 2020-06-12 MED ORDER — EZETIMIBE 10 MG PO TABS: 10.0000 mg | ORAL_TABLET | Freq: Every day | ORAL | 6 refills | Status: DC

## 2020-06-12 NOTE — Patient Instructions (Signed)
Medication Instructions:  Your physician has recommended you make the following change in your medication:   1) Start Zetia 10 mg, 1 tablet by mouth once a day  *If you need a refill on your cardiac medications before your next appointment, please call your pharmacy*  Lab Work: None ordered today  Testing/Procedures: None ordered today  Follow-Up:  On 09/17/20 at 8:40AM with [name], MD   Other Instructions Check blood pressure and call if your blood pressure is consistently 130/80 or higher

## 2020-06-25 ENCOUNTER — Ambulatory Visit: Payer: Medicare Other | Admitting: Cardiovascular Disease

## 2020-08-03 ENCOUNTER — Other Ambulatory Visit: Payer: Self-pay | Admitting: Cardiovascular Disease

## 2020-08-08 ENCOUNTER — Telehealth: Payer: Self-pay

## 2020-08-08 NOTE — Telephone Encounter (Signed)
Received fax from [facility] that patient is due for a bone density exam, called to inform patient and her daughter. Pt daughter states she is aware and plans to schedule pt for that and a mammogram soon. Daughter states pt still trying to get back to normal after having her 2 stents placed.

## 2020-09-06 ENCOUNTER — Encounter (HOSPITAL_COMMUNITY): Payer: Self-pay | Admitting: Cardiology

## 2020-09-17 ENCOUNTER — Ambulatory Visit: Payer: Medicare Other | Admitting: Cardiovascular Disease

## 2020-09-27 ENCOUNTER — Encounter: Payer: Self-pay | Admitting: Cardiovascular Disease

## 2020-09-27 ENCOUNTER — Ambulatory Visit: Payer: Medicare Other | Admitting: Cardiovascular Disease

## 2020-09-27 ENCOUNTER — Telehealth: Payer: Self-pay | Admitting: Cardiovascular Disease

## 2020-09-27 ENCOUNTER — Other Ambulatory Visit: Payer: Self-pay

## 2020-09-27 VITALS — BP 172/80 | HR 76 | Ht <= 58 in | Wt 136.8 lb

## 2020-09-27 DIAGNOSIS — I2510 Atherosclerotic heart disease of native coronary artery without angina pectoris: Secondary | ICD-10-CM | POA: Diagnosis not present

## 2020-09-27 DIAGNOSIS — Z79899 Other long term (current) drug therapy: Secondary | ICD-10-CM

## 2020-09-27 DIAGNOSIS — E782 Mixed hyperlipidemia: Secondary | ICD-10-CM | POA: Diagnosis not present

## 2020-09-27 MED ORDER — HYDROCHLOROTHIAZIDE 25 MG PO TABS
25.0000 mg | ORAL_TABLET | Freq: Every day | ORAL | 3 refills | Status: DC
Start: 2020-09-27 — End: 2021-08-01

## 2020-09-27 NOTE — Telephone Encounter (Signed)
Spoke with the pts sister and she advised that the pt had already at some point increased her HCTZ to 25 mg daily... per Dr. Elease Hashimoto, pt to stay on the dose she is on (25mg ) and to watch her NA intake and to let know how she is doing after several days and she agreed.

## 2020-09-27 NOTE — Patient Instructions (Addendum)
Medication Instructions:  Your physician has recommended you make the following change in your medication:  1. INCREASE Hydrochlorothiazide to 25 mg once daily 2. STOP Zetia  *If you need a refill on your cardiac medications before your next appointment, please call your pharmacy*   Lab Work: Your physician recommends that you return for lab work in: 3 weeks for BMP If you have labs (blood work) drawn today and your tests are completely normal, you will receive your results only by: Marland Kitchen MyChart Message (if you have MyChart) OR . A paper copy in the mail If you have any lab test that is abnormal or we need to change your treatment, we will call you to review the results.   Testing/Procedures: None ordered   Follow-Up: At [facility], you and your health needs are our priority.  As part of our continuing mission to provide you with exceptional heart care, we have created designated Provider Care Teams.  These Care Teams include your primary Cardiologist (physician) and Advanced Practice Providers (APPs -  Physician Assistants and Nurse Practitioners) who all work together to provide you with the care you need, when you need it.   You have been referred to our pharmacist to further discuss Lipid management.   Your next appointment:   6 month(s)  The format for your next appointment:   In Person  Provider:   You will see one of the following Advanced Practice Providers on your designated Care Team:    Tereso Newcomer, PA-C  [place], [place]    Thank you for choosing [facility]!!     Other Instructions  For your  leg edema you  should do  the following 1. Leg elevation - I recommend the Lounge Dr. Leg rest.  See below for details  2. Salt restriction  -  Use potassium chloride instead of regular salt as a salt substitute. 3. Walk regularly 4. Compression hose - [facility] [employer] 5. Weight loss    Available on [email or web address] Or  Go to  [email or web address]
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
## 2020-09-27 NOTE — Telephone Encounter (Signed)
Sister of the patient wanted to confirm dosages of the patient medications with the Nurse

## 2020-11-29 ENCOUNTER — Ambulatory Visit: Payer: Medicare Other | Admitting: Neurology

## 2021-01-15 ENCOUNTER — Telehealth: Payer: Self-pay | Admitting: Nurse Practitioner

## 2021-01-15 NOTE — Telephone Encounter (Signed)
Attempted to schedule AWV. Unable to LVM.  Will try at later time.  

## 2021-01-23 ENCOUNTER — Other Ambulatory Visit: Payer: Self-pay

## 2021-01-24 ENCOUNTER — Ambulatory Visit (INDEPENDENT_AMBULATORY_CARE_PROVIDER_SITE_OTHER): Payer: Medicare Other | Admitting: Nurse Practitioner

## 2021-01-24 ENCOUNTER — Encounter: Payer: Self-pay | Admitting: Nurse Practitioner

## 2021-01-24 VITALS — BP 130/70 | HR 74 | Temp 97.4°F | Wt 136.4 lb

## 2021-01-24 DIAGNOSIS — I5032 Chronic diastolic (congestive) heart failure: Secondary | ICD-10-CM

## 2021-01-24 DIAGNOSIS — I10 Essential (primary) hypertension: Secondary | ICD-10-CM

## 2021-01-24 DIAGNOSIS — E782 Mixed hyperlipidemia: Secondary | ICD-10-CM | POA: Diagnosis not present

## 2021-01-24 DIAGNOSIS — J4520 Mild intermittent asthma, uncomplicated: Secondary | ICD-10-CM | POA: Diagnosis not present

## 2021-01-24 DIAGNOSIS — D649 Anemia, unspecified: Secondary | ICD-10-CM

## 2021-01-24 MED ORDER — ALBUTEROL SULFATE HFA 108 (90 BASE) MCG/ACT IN AERS: 1.0000 | INHALATION_SPRAY | Freq: Four times a day (QID) | RESPIRATORY_TRACT | 2 refills | Status: DC | PRN

## 2021-01-24 NOTE — Assessment & Plan Note (Signed)
BP at goal with atenolol, imdur and losartan BP Readings from Last 3 Encounters:  01/24/21 130/70  09/27/20 (!) 172/80  06/12/20 140/70   Repeat BMP Maintain current medications

## 2021-01-24 NOTE — Assessment & Plan Note (Signed)
Repeat lipid panel Unable to tolerate statins

## 2021-01-24 NOTE — Assessment & Plan Note (Signed)
Controlled with use of albuterol <2x/week Refill sent

## 2021-01-24 NOTE — Progress Notes (Signed)
Subjective:  Patient ID: [name], female    DOB: 09-29-1928  Age: 87 y.o. MRN: [id number]  CC: Follow-up (Pt in need of inhaler refill and would like to discuss. )  HPI Accompanied by daughter.  Asthma Controlled with use of albuterol <2x/week Refill sent  Essential hypertension BP at goal with atenolol, imdur and losartan BP Readings from Last 3 Encounters:  01/24/21 130/70  09/27/20 (!) 172/80  06/12/20 140/70   Repeat BMP Maintain current medications  Hyperlipidemia Repeat lipid panel Unable to tolerate statins  Chronic diastolic CHF (congestive heart failure) (HCC) Chronic LE edema (L>R), improves with elevation per patient) No SOB, no cough, no PND or orthopnea. BP Readings from Last 3 Encounters:  01/24/21 130/70  09/27/20 (!) 172/80  06/12/20 140/70   Wt Readings from Last 3 Encounters:  01/24/21 136 lb 6.4 oz (61.9 kg)  09/27/20 136 lb 12.8 oz (62.1 kg)  06/12/20 131 lb (59.4 kg)   Repeat BMP Encourage to maintain appt with cardiology in May Maintain DASh diet  Wt Readings from Last 3 Encounters:  01/24/21 136 lb 6.4 oz (61.9 kg)  09/27/20 136 lb 12.8 oz (62.1 kg)  06/12/20 131 lb (59.4 kg)   Reviewed past Medical, Social and Family history today.  Outpatient Medications Prior to Visit  Medication Sig Dispense Refill  . aspirin EC 81 MG tablet Take 81 mg by mouth daily.    Marland Kitchen atenolol (TENORMIN) 25 MG tablet TAKE 2 TABLETS BY MOUTH  DAILY 180 tablet 3  . Calcium Carb-Cholecalciferol (CALCIUM+D3) 600-800 MG-UNIT TABS Take 1 tablet by mouth daily.    . Cholecalciferol (VITAMIN D PO) Take 1,000 Units by mouth daily.     . clopidogrel (PLAVIX) 75 MG tablet TAKE 1 TABLET BY MOUTH  DAILY WITH BREAKFAST 90 tablet 3  . isosorbide mononitrate (IMDUR) 60 MG 24 hr tablet TAKE 1 TABLET BY MOUTH  DAILY 90 tablet 3  . losartan (COZAAR) 100 MG tablet Take 100 mg by mouth daily.    . nitroGLYCERIN (NITROSTAT) 0.4 MG SL tablet Place 1 tablet (0.4 mg total)  under the tongue every 5 (five) minutes as needed for chest pain. 25 tablet 3  . Zinc 50 MG TABS Take 50 mg by mouth daily.    Marland Kitchen albuterol (PROVENTIL HFA;VENTOLIN HFA) 108 (90 Base) MCG/ACT inhaler Inhale 2 puffs into the lungs every 6 (six) hours as needed for wheezing or shortness of breath. 1 Inhaler 2  . ezetimibe (ZETIA) 10 MG tablet Take 1 tablet (10 mg total) by mouth daily. (Patient not taking: Reported on 01/24/2021) 30 tablet 6  . hydrochlorothiazide (HYDRODIURIL) 25 MG tablet Take 1 tablet (25 mg total) by mouth daily. 90 tablet 3   No facility-administered medications prior to visit.    ROS See HPI  Objective:  BP 130/70 (BP Location: Right Arm, Patient Position: Sitting, Cuff Size: Normal)   Pulse 74   Temp (!) 97.4 F (36.3 C) (Temporal)   Wt 136 lb 6.4 oz (61.9 kg)   SpO2 98%   BMI 29.52 kg/m   Physical Exam Cardiovascular:     Rate and Rhythm: Normal rate and regular rhythm.     Pulses: Normal pulses.     Heart sounds: Normal heart sounds.  Pulmonary:     Effort: Pulmonary effort is normal.     Breath sounds: Normal breath sounds.  Musculoskeletal:        General: No tenderness.     Right lower leg: No edema.  Left lower leg: Edema present.     Comments: States swelling is chronic, improves with elevation  Skin:    Findings: No erythema.  Neurological:     Mental Status: She is alert and oriented to person, place, and time.  Psychiatric:        Mood and Affect: Mood normal.        Behavior: Behavior normal.        Thought Content: Thought content normal.    Assessment & Plan:  This visit occurred during the SARS-CoV-2 public health emergency.  Safety protocols were in place, including screening questions prior to the visit, additional usage of staff PPE, and extensive cleaning of exam room while observing appropriate contact time as indicated for disinfecting solutions.   [name] was seen today for follow-up.  Diagnoses and all orders for this  visit:  Mild intermittent asthma without complication  Essential hypertension -     Comprehensive metabolic panel  Mixed hyperlipidemia -     Comprehensive metabolic panel -     Lipid panel  Anemia, unspecified type -     CBC with Differential/Platelet -     Iron, TIBC and Ferritin Panel  Chronic diastolic CHF (congestive heart failure) (HCC)  Other orders -     albuterol (VENTOLIN HFA) 108 (90 Base) MCG/ACT inhaler; Inhale 1-2 puffs into the lungs every 6 (six) hours as needed for wheezing or shortness of breath.    Problem List Items Addressed This Visit      Cardiovascular and Mediastinum   Chronic diastolic CHF (congestive heart failure) (HCC)    Chronic LE edema (L>R), improves with elevation per patient) No SOB, no cough, no PND or orthopnea. BP Readings from Last 3 Encounters:  01/24/21 130/70  09/27/20 (!) 172/80  06/12/20 140/70   Wt Readings from Last 3 Encounters:  01/24/21 136 lb 6.4 oz (61.9 kg)  09/27/20 136 lb 12.8 oz (62.1 kg)  06/12/20 131 lb (59.4 kg)   Repeat BMP Encourage to maintain appt with cardiology in May Maintain DASh diet      Essential hypertension    BP at goal with atenolol, imdur and losartan BP Readings from Last 3 Encounters:  01/24/21 130/70  09/27/20 (!) 172/80  06/12/20 140/70   Repeat BMP Maintain current medications      Relevant Orders   Comprehensive metabolic panel     Respiratory   Asthma - Primary    Controlled with use of albuterol <2x/week Refill sent      Relevant Medications   albuterol (VENTOLIN HFA) 108 (90 Base) MCG/ACT inhaler     Other   Anemia   Relevant Orders   CBC with Differential/Platelet   Iron, TIBC and Ferritin Panel   Hyperlipidemia    Repeat lipid panel Unable to tolerate statins      Relevant Orders   Comprehensive metabolic panel   Lipid panel      Follow-up: Return in about 6 months (around 07/27/2021) for HTN and , hyperlipidemia.  [name], NP

## 2021-01-24 NOTE — Assessment & Plan Note (Signed)
Chronic LE edema (L>R), improves with elevation per patient) No SOB, no cough, no PND or orthopnea. BP Readings from Last 3 Encounters:  01/24/21 130/70  09/27/20 (!) 172/80  06/12/20 140/70   Wt Readings from Last 3 Encounters:  01/24/21 136 lb 6.4 oz (61.9 kg)  09/27/20 136 lb 12.8 oz (62.1 kg)  06/12/20 131 lb (59.4 kg)   Repeat BMP Encourage to maintain appt with cardiology in May Maintain DASh diet

## 2021-01-24 NOTE — Patient Instructions (Signed)
Go to lab for blood draw.  Albuterol sent

## 2021-01-25 LAB — COMPREHENSIVE METABOLIC PANEL
AG Ratio: 1.2 (calc) (ref 1.0–2.5)
ALT: 6 U/L (ref 6–29)
AST: 18 U/L (ref 10–35)
Albumin: 3.9 g/dL (ref 3.6–5.1)
Alkaline phosphatase (APISO): 59 U/L (ref 37–153)
BUN/Creatinine Ratio: 21 (calc) (ref 6–22)
BUN: 22 mg/dL (ref 7–25)
CO2: 26 mmol/L (ref 20–32)
Calcium: 9.8 mg/dL (ref 8.6–10.4)
Chloride: 103 mmol/L (ref 98–110)
Creat: 1.07 mg/dL — ABNORMAL HIGH (ref 0.60–0.88)
Globulin: 3.2 g/dL (calc) (ref 1.9–3.7)
Glucose, Bld: 124 mg/dL — ABNORMAL HIGH (ref 65–99)
Potassium: 4.2 mmol/L (ref 3.5–5.3)
Sodium: 140 mmol/L (ref 135–146)
Total Bilirubin: 0.6 mg/dL (ref 0.2–1.2)
Total Protein: 7.1 g/dL (ref 6.1–8.1)

## 2021-01-25 LAB — CBC WITH DIFFERENTIAL/PLATELET
Absolute Monocytes: 311 cells/uL (ref 200–950)
Basophils Absolute: 41 cells/uL (ref 0–200)
Basophils Relative: 0.8 %
Eosinophils Absolute: 413 cells/uL (ref 15–500)
Eosinophils Relative: 8.1 %
HCT: 37.4 % (ref 35.0–45.0)
Hemoglobin: 12.6 g/dL (ref 11.7–15.5)
Lymphs Abs: 2193 cells/uL (ref 850–3900)
MCH: 30.7 pg (ref 27.0–33.0)
MCHC: 33.7 g/dL (ref 32.0–36.0)
MCV: 91.2 fL (ref 80.0–100.0)
MPV: 9.8 fL (ref 7.5–12.5)
Monocytes Relative: 6.1 %
Neutro Abs: 2142 cells/uL (ref 1500–7800)
Neutrophils Relative %: 42 %
Platelets: 201 10*3/uL (ref 140–400)
RBC: 4.1 10*6/uL (ref 3.80–5.10)
RDW: 13.4 % (ref 11.0–15.0)
Total Lymphocyte: 43 %
WBC: 5.1 10*3/uL (ref 3.8–10.8)

## 2021-01-25 LAB — LIPID PANEL
Cholesterol: 169 mg/dL (ref <200)
HDL: 37 mg/dL — ABNORMAL LOW (ref >=50)
LDL Cholesterol (Calc): 108 mg/dL (calc) — ABNORMAL HIGH
Non-HDL Cholesterol (Calc): 132 mg/dL (calc) — ABNORMAL HIGH (ref <130)
Total CHOL/HDL Ratio: 4.6 (calc) (ref <5.0)
Triglycerides: 128 mg/dL (ref <150)

## 2021-01-25 LAB — IRON,TIBC AND FERRITIN PANEL
%SAT: 22 % (calc) (ref 16–45)
Ferritin: 24 ng/mL (ref 16–288)
Iron: 85 ug/dL (ref 45–160)
TIBC: 383 mcg/dL (calc) (ref 250–450)

## 2021-02-28 ENCOUNTER — Telehealth: Payer: Self-pay | Admitting: Cardiovascular Disease

## 2021-02-28 NOTE — Telephone Encounter (Signed)
Pt c/o of Chest Pain: STAT if CP now or developed within 24 hours  1. Are you having CP right now? No, not at this time- it woke her up out of her sleep  With chest hurting- this have happen 2 times  2. Are you experiencing any other symptoms (ex. SOB, nausea, vomiting, sweating)? no  3. How long have you been experiencing CP? Started on Tuesday  4. Is your CP continuous or coming and going?  When it happen it stays until she take her Nitro  5. Have you taken Nitroglycerin? Yes- pt wants to be seen before Wednesday, would like to be seen asap ?

## 2021-02-28 NOTE — Telephone Encounter (Signed)
Spoke with the patient's daughter. She reports the patient woke up on Tuesday night and sat straight up with CP. She took 2 NTG tablets and the pain was relieved. She had absolutely no other symptoms.  Then the pain occurred again last night. She again took 2 NTG tablets and the pain was relieved.   She has no symptoms any other time of day.  The patient is scheduled for an appointment with Dr. Elease Hashimoto 4/13. Her daughter understands to call 911 if CP is not relieved with 3 NTG tablets. She will call the on-call over the weekend if assistance is needed. They will keep appointment on 4/13 and call if symptoms worsen prior to that time.

## 2021-02-28 NOTE — Telephone Encounter (Signed)
This was sent to preop pool by accident

## 2021-03-03 NOTE — Telephone Encounter (Signed)
Call made to patients daughter. Appointment rescheduled for Tuesday 4/12 at 9am.

## 2021-03-04 ENCOUNTER — Encounter: Payer: Self-pay | Admitting: Cardiovascular Disease

## 2021-03-04 ENCOUNTER — Other Ambulatory Visit: Payer: Self-pay

## 2021-03-04 ENCOUNTER — Ambulatory Visit: Payer: Medicare Other | Admitting: Cardiovascular Disease

## 2021-03-04 VITALS — BP 154/74 | HR 55 | Ht <= 58 in | Wt 130.0 lb

## 2021-03-04 DIAGNOSIS — I209 Angina pectoris, unspecified: Secondary | ICD-10-CM

## 2021-03-04 LAB — CBC
Hematocrit: 36 % (ref 34.0–46.6)
Hemoglobin: 12.1 g/dL (ref 11.1–15.9)
MCH: 30.5 pg (ref 26.6–33.0)
MCHC: 33.6 g/dL (ref 31.5–35.7)
MCV: 91 fL (ref 79–97)
Platelets: 195 10*3/uL (ref 150–450)
RBC: 3.97 x10E6/uL (ref 3.77–5.28)
RDW: 13.4 % (ref 11.7–15.4)
WBC: 6.3 10*3/uL (ref 3.4–10.8)

## 2021-03-04 LAB — BASIC METABOLIC PANEL
BUN/Creatinine Ratio: 23 (ref 12–28)
BUN: 23 mg/dL (ref 10–36)
CO2: 24 mmol/L (ref 20–29)
Calcium: 9.3 mg/dL (ref 8.7–10.3)
Chloride: 100 mmol/L (ref 96–106)
Creatinine, Ser: 1 mg/dL (ref 0.57–1.00)
Glucose: 101 mg/dL — ABNORMAL HIGH (ref 65–99)
Potassium: 3.8 mmol/L (ref 3.5–5.2)
Sodium: 139 mmol/L (ref 134–144)
eGFR: 53 mL/min/{1.73_m2} — ABNORMAL LOW (ref >59)
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
## 2021-03-04 NOTE — Patient Instructions (Signed)
Medication Instructions:  Your physician recommends that you continue on your current medications as directed. Please refer to the Current Medication list given to you today.  *If you need a refill on your cardiac medications before your next appointment, please call your pharmacy*   Lab Work: TODAY:CBC. BMET If you have labs (blood work) drawn today and your tests are completely normal, you will receive your results only by: Marland Kitchen MyChart Message (if you have MyChart) OR . A paper copy in the mail If you have any lab test that is abnormal or we need to change your treatment, we will call you to review the results.   Testing/Procedures: Your physician has requested that you have a cardiac catheterization. Cardiac catheterization is used to diagnose and/or treat various heart conditions. Doctors may recommend this procedure for a number of different reasons. The most common reason is to evaluate chest pain. Chest pain can be a symptom of coronary artery disease (CAD), and cardiac catheterization can show whether plaque is narrowing or blocking your heart's arteries. This procedure is also used to evaluate the valves, as well as measure the blood flow and oxygen levels in different parts of your heart. For further information please visit [email or web address] Please follow instruction sheet, as given.  SEE BELOW   Follow-Up: At [facility], you and your health needs are our priority.  As part of our continuing mission to provide you with exceptional heart care, we have created designated Provider Care Teams.  These Care Teams include your primary Cardiologist (physician) and Advanced Practice Providers (APPs -  Physician Assistants and Nurse Practitioners) who all work together to provide you with the care you need, when you need it.   Your next appointment:    05/09/2021 at 10:45am   The format for your next appointment:   In Person  Provider:   Tereso Newcomer, PA-C   Other  Instructions     MEDICAL GROUP [facility] CARDIOVASCULAR DIVISION [facility] ST OFFICE [address] [place] [address] Dept: [phone] Loc: [phone]  Reginia Battie Knoop  03/04/2021  You are scheduled for a Cardiac Catheterization on Friday, April 15 with Dr. Verdis Prime.  1. Please arrive at the [facility] (Main Entrance A) at [facility]: [address] [place], [place] [address] at 5:30 AM (This time is two hours before your procedure to ensure your preparation). Free valet parking service is available.   Special note: Every effort is made to have your procedure done on time. Please understand that emergencies sometimes delay scheduled procedures.  2. Diet: Do not eat solid foods after midnight.  The patient may have clear liquids until 5am upon the day of the procedure.  3. Labs: You will have blood drawn today  Due to recent COVID-19 restrictions implemented by our local and state authorities and in an effort to keep both patients and staff as safe as possible, our hospital system requires COVID-19 testing prior to certain scheduled hospital procedures.  Please go to [address] [facility]. [place], [place] [address] on 4/13 at 11:45am  .  This is a drive up testing site.  You will not need to exit your vehicle.  You will not be billed at the time of testing but may receive a bill later depending on your insurance. You must agree to self-quarantine from the time of your testing until the procedure date on 4/15.  This should included staying home with ONLY the people you live with.  Avoid take-out, grocery store shopping or leaving the house for any non-emergent reason.  Failure to have your COVID-19 test done on the date and time you have been scheduled will result in cancellation of your procedure.  Please call our office at [phone] if you have any questions.   4. Medication instructions in preparation for your procedure  HOLD  Losartan the morning of your procedure    On the morning of your procedure, take your Plavix/Clopidogreland aspirin and any morning medicines NOT listed above.  You may use sips of water.  5. Plan for one night stay--bring personal belongings. 6. Bring a current list of your medications and current insurance cards. 7. You MUST have a responsible person to drive you home. 8. Someone MUST be with you the first 24 hours after you arrive home or your discharge will be delayed. 9. Please wear clothes that are easy to get on and off and wear slip-on shoes.  Thank you for allowing [place] to care for you!   -- [place] [facility]
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
## 2021-03-05 ENCOUNTER — Other Ambulatory Visit (HOSPITAL_COMMUNITY)
Admission: RE | Admit: 2021-03-05 | Discharge: 2021-03-05 | Disposition: A | Discharge status: Home / Self Care | Payer: Medicare Other | Source: Ambulatory Visit | Attending: Interventional Cardiology | Admitting: Interventional Cardiology

## 2021-03-05 ENCOUNTER — Ambulatory Visit: Payer: Medicare Other | Admitting: Cardiovascular Disease

## 2021-03-05 DIAGNOSIS — Z20822 Contact with and (suspected) exposure to covid-19: Secondary | ICD-10-CM | POA: Diagnosis not present

## 2021-03-05 DIAGNOSIS — Z01812 Encounter for preprocedural laboratory examination: Secondary | ICD-10-CM | POA: Insufficient documentation

## 2021-03-05 LAB — SARS CORONAVIRUS 2 (TAT 6-24 HRS): SARS Coronavirus 2: NEGATIVE

## 2021-03-06 ENCOUNTER — Telehealth: Payer: Self-pay | Admitting: *Deleted

## 2021-03-06 NOTE — Telephone Encounter (Signed)
Pt contacted pre-catheterization scheduled at [facility] for: Friday March 07, 2021 7:30 AM Verified arrival time and place: [name]) at: 5:30 AM   No solid food after midnight prior to cath, clear liquids until 5 AM day of procedure.  Hold: Losartan-AM of procedure-GFR 53 Lasix-AM of procedure-GFR 53 NSAIDs-until post procedure-GFR 53  Except hold medications AM meds can be  taken pre-cath with sips of water including: ASA 81 mg Plavix 75 mg  Confirmed patient has responsible adult to drive home post procedure and be with patient first 24 hours after arriving home: yes  You are allowed ONE visitor in the waiting room during the time you are at the hospital for your procedure. Both you and your visitor must wear a mask once you enter the hospital.   Reviewed procedure/mask/visitor instructions with patient's daughter (DPR), [name].

## 2021-03-07 ENCOUNTER — Other Ambulatory Visit: Payer: Self-pay

## 2021-03-07 ENCOUNTER — Ambulatory Visit (HOSPITAL_COMMUNITY)
Admission: RE | Admit: 2021-03-07 | Discharge: 2021-03-07 | Disposition: A | Discharge status: Home / Self Care | Payer: Medicare Other | Attending: Interventional Cardiology | Admitting: Interventional Cardiology

## 2021-03-07 ENCOUNTER — Encounter (HOSPITAL_COMMUNITY): Payer: Self-pay | Admitting: Interventional Cardiology

## 2021-03-07 ENCOUNTER — Encounter (HOSPITAL_COMMUNITY)
Admission: RE | Disposition: A | Discharge status: Home / Self Care | Payer: Self-pay | Source: Home / Self Care | Attending: Interventional Cardiology

## 2021-03-07 DIAGNOSIS — Z955 Presence of coronary angioplasty implant and graft: Secondary | ICD-10-CM | POA: Insufficient documentation

## 2021-03-07 DIAGNOSIS — I110 Hypertensive heart disease with heart failure: Secondary | ICD-10-CM | POA: Diagnosis not present

## 2021-03-07 DIAGNOSIS — E785 Hyperlipidemia, unspecified: Secondary | ICD-10-CM | POA: Diagnosis not present

## 2021-03-07 DIAGNOSIS — I25110 Atherosclerotic heart disease of native coronary artery with unstable angina pectoris: Secondary | ICD-10-CM | POA: Insufficient documentation

## 2021-03-07 DIAGNOSIS — Z885 Allergy status to narcotic agent status: Secondary | ICD-10-CM | POA: Diagnosis not present

## 2021-03-07 DIAGNOSIS — I5032 Chronic diastolic (congestive) heart failure: Secondary | ICD-10-CM | POA: Diagnosis not present

## 2021-03-07 DIAGNOSIS — J45909 Unspecified asthma, uncomplicated: Secondary | ICD-10-CM | POA: Diagnosis not present

## 2021-03-07 DIAGNOSIS — I5042 Chronic combined systolic (congestive) and diastolic (congestive) heart failure: Secondary | ICD-10-CM | POA: Diagnosis present

## 2021-03-07 DIAGNOSIS — I200 Unstable angina: Secondary | ICD-10-CM | POA: Diagnosis present

## 2021-03-07 DIAGNOSIS — I5043 Acute on chronic combined systolic (congestive) and diastolic (congestive) heart failure: Secondary | ICD-10-CM | POA: Diagnosis present

## 2021-03-07 DIAGNOSIS — Z7982 Long term (current) use of aspirin: Secondary | ICD-10-CM | POA: Diagnosis not present

## 2021-03-07 DIAGNOSIS — Z79899 Other long term (current) drug therapy: Secondary | ICD-10-CM | POA: Insufficient documentation

## 2021-03-07 DIAGNOSIS — Z888 Allergy status to other drugs, medicaments and biological substances status: Secondary | ICD-10-CM | POA: Diagnosis not present

## 2021-03-07 DIAGNOSIS — I25119 Atherosclerotic heart disease of native coronary artery with unspecified angina pectoris: Secondary | ICD-10-CM

## 2021-03-07 DIAGNOSIS — I2510 Atherosclerotic heart disease of native coronary artery without angina pectoris: Secondary | ICD-10-CM | POA: Diagnosis present

## 2021-03-07 DIAGNOSIS — I209 Angina pectoris, unspecified: Secondary | ICD-10-CM

## 2021-03-07 HISTORY — PX: LEFT HEART CATH AND CORONARY ANGIOGRAPHY: CATH118249

## 2021-03-07 SURGERY — LEFT HEART CATH AND CORONARY ANGIOGRAPHY
Anesthesia: LOCAL

## 2021-03-07 MED ORDER — SODIUM CHLORIDE 0.9 % WEIGHT BASED INFUSION
3.0000 mL/kg/h | INTRAVENOUS | Status: DC
Start: 2021-03-08 — End: 2021-03-07
  Administered 2021-03-07: 3 mL/kg/h via INTRAVENOUS

## 2021-03-07 MED ORDER — LIDOCAINE HCL (PF) 1 % IJ SOLN
INTRAMUSCULAR | Status: DC | PRN
  Administered 2021-03-07: 2 mL

## 2021-03-07 MED ORDER — SODIUM CHLORIDE 0.9 % IV SOLN: INTRAVENOUS | Status: DC

## 2021-03-07 MED ORDER — SODIUM CHLORIDE 0.9% FLUSH: 3.0000 mL | INTRAVENOUS | Status: DC | PRN

## 2021-03-07 MED ORDER — HEPARIN (PORCINE) IN NACL 1000-0.9 UT/500ML-% IV SOLN
INTRAVENOUS | Status: AC
  Filled 2021-03-07: qty 1000

## 2021-03-07 MED ORDER — SODIUM CHLORIDE 0.9 % IV SOLN: 250.0000 mL | INTRAVENOUS | Status: DC | PRN

## 2021-03-07 MED ORDER — LIDOCAINE HCL (PF) 1 % IJ SOLN
INTRAMUSCULAR | Status: AC
  Filled 2021-03-07: qty 30

## 2021-03-07 MED ORDER — ASPIRIN 81 MG PO CHEW: 81.0000 mg | CHEWABLE_TABLET | ORAL | Status: DC

## 2021-03-07 MED ORDER — HEPARIN (PORCINE) IN NACL 1000-0.9 UT/500ML-% IV SOLN
INTRAVENOUS | Status: DC | PRN
Start: 2021-03-07 — End: 2021-03-07
  Administered 2021-03-07 (×2): 500 mL

## 2021-03-07 MED ORDER — HEPARIN SODIUM (PORCINE) 1000 UNIT/ML IJ SOLN
INTRAMUSCULAR | Status: DC | PRN
  Administered 2021-03-07: 3000 [IU] via INTRAVENOUS

## 2021-03-07 MED ORDER — SODIUM CHLORIDE 0.9 % WEIGHT BASED INFUSION: 1.0000 mL/kg/h | INTRAVENOUS | Status: DC

## 2021-03-07 MED ORDER — LABETALOL HCL 5 MG/ML IV SOLN: 10.0000 mg | INTRAVENOUS | Status: DC | PRN

## 2021-03-07 MED ORDER — VERAPAMIL HCL 2.5 MG/ML IV SOLN
INTRAVENOUS | Status: DC | PRN
  Administered 2021-03-07: 10 mL via INTRA_ARTERIAL

## 2021-03-07 MED ORDER — MIDAZOLAM HCL 2 MG/2ML IJ SOLN
INTRAMUSCULAR | Status: DC | PRN
Start: 2021-03-07 — End: 2021-03-07
  Administered 2021-03-07: 0.5 mg via INTRAVENOUS

## 2021-03-07 MED ORDER — ONDANSETRON HCL 4 MG/2ML IJ SOLN: 4.0000 mg | Freq: Four times a day (QID) | INTRAMUSCULAR | Status: DC | PRN

## 2021-03-07 MED ORDER — CLOPIDOGREL BISULFATE 75 MG PO TABS: 75.0000 mg | ORAL_TABLET | Freq: Every day | ORAL | Status: DC

## 2021-03-07 MED ORDER — ACETAMINOPHEN 325 MG PO TABS: 650.0000 mg | ORAL_TABLET | ORAL | Status: DC | PRN

## 2021-03-07 MED ORDER — CLOPIDOGREL BISULFATE 75 MG PO TABS
75.0000 mg | ORAL_TABLET | Freq: Once | ORAL | Status: AC
  Administered 2021-03-07: 75 mg via ORAL
  Filled 2021-03-07: qty 1

## 2021-03-07 MED ORDER — ASPIRIN 81 MG PO CHEW
CHEWABLE_TABLET | ORAL | Status: AC
  Administered 2021-03-07: 81 mg via ORAL
  Filled 2021-03-07: qty 1

## 2021-03-07 MED ORDER — ASPIRIN 81 MG PO CHEW
81.0000 mg | CHEWABLE_TABLET | ORAL | Status: AC
Start: 2021-03-07 — End: 2021-03-07

## 2021-03-07 MED ORDER — IOHEXOL 350 MG/ML SOLN
INTRAVENOUS | Status: DC | PRN
  Administered 2021-03-07: 70 mL

## 2021-03-07 MED ORDER — MIDAZOLAM HCL 2 MG/2ML IJ SOLN
INTRAMUSCULAR | Status: AC
  Filled 2021-03-07: qty 2

## 2021-03-07 MED ORDER — FENTANYL CITRATE (PF) 100 MCG/2ML IJ SOLN
INTRAMUSCULAR | Status: AC
  Filled 2021-03-07: qty 2

## 2021-03-07 MED ORDER — FENTANYL CITRATE (PF) 100 MCG/2ML IJ SOLN
INTRAMUSCULAR | Status: DC | PRN
  Administered 2021-03-07: 12.5 ug via INTRAVENOUS

## 2021-03-07 MED ORDER — HEPARIN SODIUM (PORCINE) 1000 UNIT/ML IJ SOLN
INTRAMUSCULAR | Status: AC
  Filled 2021-03-07: qty 1

## 2021-03-07 MED ORDER — SODIUM CHLORIDE 0.9% FLUSH: 3.0000 mL | Freq: Two times a day (BID) | INTRAVENOUS | Status: DC

## 2021-03-07 MED ORDER — ASPIRIN 81 MG PO CHEW: 81.0000 mg | CHEWABLE_TABLET | Freq: Every day | ORAL | Status: DC

## 2021-03-07 MED ORDER — HYDRALAZINE HCL 20 MG/ML IJ SOLN: 10.0000 mg | INTRAMUSCULAR | Status: DC | PRN

## 2021-03-07 MED ORDER — SODIUM CHLORIDE 0.9% FLUSH
3.0000 mL | INTRAVENOUS | Status: DC | PRN
Start: 2021-03-07 — End: 2021-03-07

## 2021-03-07 MED ORDER — VERAPAMIL HCL 2.5 MG/ML IV SOLN
INTRAVENOUS | Status: AC
  Filled 2021-03-07: qty 2

## 2021-03-07 SURGICAL SUPPLY — 11 items
CATH 5FR JL3.5 JR4 ANG PIG MP (CATHETERS) ×1 IMPLANT
DEVICE RAD COMP TR BAND LRG (VASCULAR PRODUCTS) ×1 IMPLANT
GLIDESHEATH SLEND A-KIT 6F 22G (SHEATH) ×1 IMPLANT
GUIDEWIRE INQWIRE 1.5J.035X260 (WIRE) IMPLANT
INQWIRE 1.5J .035X260CM (WIRE) ×2
KIT HEART LEFT (KITS) ×2 IMPLANT
PACK CARDIAC CATHETERIZATION (CUSTOM PROCEDURE TRAY) ×2 IMPLANT
SHEATH PROBE COVER 6X72 (BAG) ×1 IMPLANT
TRANSDUCER W/STOPCOCK (MISCELLANEOUS) ×2 IMPLANT
TUBING CIL FLEX 10 FLL-RA (TUBING) ×2 IMPLANT
WIRE HI TORQ VERSACORE-J 145CM (WIRE) ×1 IMPLANT

## 2021-03-07 NOTE — Progress Notes (Signed)
Per Dr Katrinka Blazing, pt may take her AM meds that she brought with her.

## 2021-03-07 NOTE — CV Procedure (Signed)
   Previously stented LAD and RCA segments are widely patent.  Proximal LAD near ostium is 50%, slightly worse than 05/2020 and is where GuideLiner crossed 30% stenosis. This is not flow limiting and would not cause rest pain.  Diffuse Cfx disease within a tortuous vessel.  Overall, anatomy is very similar to July 2021 with the exception that there is progression of 30% proximal LAD stenosis to 50%.  There is distal disease in LAD, proximal to mid circumflex disease, and diffuse distal RCA disease that could cause angina.  Recommend uptitrate medical therapy.  Use nitroglycerin for angina.  Previously placed stents are widely patent.

## 2021-03-07 NOTE — Discharge Instructions (Signed)
Radial Site Care KEEP RIGHT WRIST ELEVATED 24 HOURS  This sheet gives you information about how to care for yourself after your procedure. Your health care provider may also give you more specific instructions. If you have problems or questions, contact your health care provider. What can I expect after the procedure? After the procedure, it is common to have:  Bruising and tenderness at the catheter insertion area. Follow these instructions at home: Medicines  Take over-the-counter and prescription medicines only as told by your health care provider. Insertion site care  Follow instructions from your health care provider about how to take care of your insertion site. Make sure you: ? Wash your hands with soap and water before you change your bandage (dressing). If soap and water are not available, use hand sanitizer. ? Change your dressing as told by your health care provider. ? Leave stitches (sutures), skin glue, or adhesive strips in place. These skin closures may need to stay in place for 2 weeks or longer. If adhesive strip edges start to loosen and curl up, you may trim the loose edges. Do not remove adhesive strips completely unless your health care provider tells you to do that.  Check your insertion site every day for signs of infection. Check for: ? Redness, swelling, or pain. ? Fluid or blood. ? Pus or a bad smell. ? Warmth.  Do not take baths, swim, or use a hot tub until your health care provider approves.  You may shower 24-48 hours after the procedure, or as directed by your health care provider. ? Remove the dressing and gently wash the site with plain soap and water. ? Pat the area dry with a clean towel. ? Do not rub the site. That could cause bleeding.  Do not apply powder or lotion to the site. Activity  For 24 hours after the procedure, or as directed by your health care provider: ? Do not flex or bend the affected arm. ? Do not push or pull heavy objects  with the affected arm. ? Do not drive yourself home from the hospital or clinic. You may drive 24 hours after the procedure unless your health care provider tells you not to. ? Do not operate machinery or power tools.  Do not lift anything that is heavier than 10 lb (4.5 kg), or the limit that you are told, until your health care provider says that it is safe.  Ask your health care provider when it is okay to: ? Return to work or school. ? Resume usual physical activities or sports. ? Resume sexual activity.   General instructions  If the catheter site starts to bleed, raise your arm and put firm pressure on the site. If the bleeding does not stop, get help right away. This is a medical emergency.  If you went home on the same day as your procedure, a responsible adult should be with you for the first 24 hours after you arrive home.  Keep all follow-up visits as told by your health care provider. This is important. Contact a health care provider if:  You have a fever.  You have redness, swelling, or yellow drainage around your insertion site. Get help right away if:  You have unusual pain at the radial site.  The catheter insertion area swells very fast.  The insertion area is bleeding, and the bleeding does not stop when you hold steady pressure on the area.  Your arm or hand becomes pale, cool, tingly, or numb.  These symptoms may represent a serious problem that is an emergency. Do not wait to see if the symptoms will go away. Get medical help right away. Call your local emergency services (911 in the [place] Do not drive yourself to the hospital. Summary  After the procedure, it is common to have bruising and tenderness at the site.  Follow instructions from your health care provider about how to take care of your radial site wound. Check the wound every day for signs of infection.  Do not lift anything that is heavier than 10 lb (4.5 kg), or the limit that you are told, until  your health care provider says that it is safe. This information is not intended to replace advice given to you by your health care provider. Make sure you discuss any questions you have with your health care provider. Document Revised: 12/15/2017 Document Reviewed: 12/15/2017 Elsevier Patient Education  2021 [employer].

## 2021-03-07 NOTE — Interval H&P Note (Signed)
Cath Lab Visit (complete for each Cath Lab visit)  Clinical Evaluation Leading to the Procedure:   ACS: No.  Non-ACS:    Anginal Classification: CCS II  Anti-ischemic medical therapy: Maximal Therapy (2 or more classes of medications)  Non-Invasive Test Results: No non-invasive testing performed  Prior CABG: No previous CABG      History and Physical Interval Note:  03/07/2021 7:40 AM  [name]  has presented today for surgery, with the diagnosis of unstable angina.  The various methods of treatment have been discussed with the patient and family. After consideration of risks, benefits and other options for treatment, the patient has consented to  Procedure(s): LEFT HEART CATH AND CORONARY ANGIOGRAPHY (N/A) as a surgical intervention.  The patient's history has been reviewed, patient examined, no change in status, stable for surgery.  I have reviewed the patient's chart and labs.  Questions were answered to the patient's satisfaction.     Lyn Records III

## 2021-03-25 ENCOUNTER — Other Ambulatory Visit: Payer: Self-pay | Admitting: Physician Assistant

## 2021-04-17 DIAGNOSIS — H353132 Nonexudative age-related macular degeneration, bilateral, intermediate dry stage: Secondary | ICD-10-CM | POA: Diagnosis not present

## 2021-04-17 DIAGNOSIS — Z961 Presence of intraocular lens: Secondary | ICD-10-CM | POA: Diagnosis not present
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
## 2021-05-09 ENCOUNTER — Encounter: Payer: Self-pay | Admitting: Physician Assistant

## 2021-05-09 ENCOUNTER — Ambulatory Visit: Payer: Medicare Other | Admitting: Physician Assistant

## 2021-05-09 ENCOUNTER — Other Ambulatory Visit: Payer: Self-pay

## 2021-05-09 VITALS — BP 100/60 | HR 89 | Ht <= 58 in | Wt 131.0 lb

## 2021-05-09 DIAGNOSIS — I5032 Chronic diastolic (congestive) heart failure: Secondary | ICD-10-CM

## 2021-05-09 DIAGNOSIS — I25119 Atherosclerotic heart disease of native coronary artery with unspecified angina pectoris: Secondary | ICD-10-CM | POA: Diagnosis not present

## 2021-05-09 DIAGNOSIS — I10 Essential (primary) hypertension: Secondary | ICD-10-CM

## 2021-05-09 DIAGNOSIS — E782 Mixed hyperlipidemia: Secondary | ICD-10-CM

## 2021-05-09 MED ORDER — ISOSORBIDE MONONITRATE ER 60 MG PO TB24: 90.0000 mg | ORAL_TABLET | Freq: Every day | ORAL | 3 refills | Status: DC

## 2021-05-09 MED ORDER — LOSARTAN POTASSIUM 50 MG PO TABS: 50.0000 mg | ORAL_TABLET | Freq: Every day | ORAL | 3 refills | Status: DC

## 2021-05-09 NOTE — Patient Instructions (Signed)
Medication Instructions:  Your physician has recommended you make the following change in your medication:    DECREASE LOSARTAN one tablet by mouth (50 mg) daily  INCREASE IMDUR one and one half tablet by mouth (90 mg) daily.  *If you need a refill on your cardiac medications before your next appointment, please call your pharmacy*   Lab Work:  -NONE  If you have labs (blood work) drawn today and your tests are completely normal, you will receive your results only by: MyChart Message (if you have MyChart) OR A paper copy in the mail If you have any lab test that is abnormal or we need to change your treatment, we will call you to review the results.   Testing/Procedures:  -NONE   Follow-Up: At [facility], you and your health needs are our priority.  As part of our continuing mission to provide you with exceptional heart care, we have created designated Provider Care Teams.  These Care Teams include your primary Cardiologist (physician) and Advanced Practice Providers (APPs -  Physician Assistants and Nurse Practitioners) who all work together to provide you with the care you need, when you need it.  We recommend signing up for the patient portal called "MyChart".  Sign up information is provided on this After Visit Summary.  MyChart is used to connect with patients for Virtual Visits (Telemedicine).  Patients are able to view lab/test results, encounter notes, upcoming appointments, etc.  Non-urgent messages can be sent to your provider as well.   To learn more about what you can do with MyChart, go to [email or web address].    Your next appointment:   6 month(s) with Dr. Elease Hashimoto on Friday, December 2 @ 11:20 am.   The format for your next appointment:   In Person  Provider:   [name], MD   Other Instructions  Please take blood pressure X 2 weeks and send readings in by [facility] or call @ [phone].    You have been referred to the lipid clinic on Friday,  July 8 @ 1:30 pm.

## 2021-05-27 ENCOUNTER — Ambulatory Visit: Payer: Medicare Other

## 2021-05-27 ENCOUNTER — Telehealth: Payer: Self-pay | Admitting: Nurse Practitioner

## 2021-05-27 NOTE — Telephone Encounter (Signed)
Left message on home phone for patient to call back and reschedule 05/27/21 Medicare Annual Wellness Visit (AWV) in office. Unable to reach patient's daughter, [name], her voice mail was full and no answer at work.   If not able to come in office, please offer to do virtually or by telephone.   Due for AWVI  Please schedule at anytime with Nurse Health Advisor.

## 2021-05-30 ENCOUNTER — Ambulatory Visit (INDEPENDENT_AMBULATORY_CARE_PROVIDER_SITE_OTHER): Payer: Medicare Other | Admitting: Pharmacist

## 2021-05-30 ENCOUNTER — Other Ambulatory Visit: Payer: Self-pay

## 2021-05-30 VITALS — Wt 130.1 lb

## 2021-05-30 DIAGNOSIS — G720 Drug-induced myopathy: Secondary | ICD-10-CM

## 2021-05-30 DIAGNOSIS — I2510 Atherosclerotic heart disease of native coronary artery without angina pectoris: Secondary | ICD-10-CM | POA: Diagnosis not present

## 2021-05-30 DIAGNOSIS — E782 Mixed hyperlipidemia: Secondary | ICD-10-CM | POA: Diagnosis not present

## 2021-05-30 DIAGNOSIS — T466X5A Adverse effect of antihyperlipidemic and antiarteriosclerotic drugs, initial encounter: Secondary | ICD-10-CM | POA: Diagnosis not present

## 2021-05-30 MED ORDER — REPATHA SURECLICK 140 MG/ML ~~LOC~~ SOAJ: 1.0000 "pen " | SUBCUTANEOUS | 11 refills | Status: DC

## 2021-05-30 NOTE — Patient Instructions (Addendum)
Your LDL cholesterol is 108 and your goal is < 55  Start Repatha injections once every 2 weeks in the fatty tissue of your stomach or upper outer thigh. This will lower your LDL cholesterol by 60% and helps to reduce the plaque in your vessels. Store the medication in the fridge  Continue taking ezetimibe (Zetia)  Have primary care check fasting cholesterol when you have your appointment on 9/9
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
## 2021-07-06 ENCOUNTER — Other Ambulatory Visit: Payer: Self-pay | Admitting: Nurse Practitioner

## 2021-07-07 NOTE — Telephone Encounter (Signed)
Chart supports Rx Quantity 30 sent to pharmacy and instructions for patient to keep upcoming appointment for additional refills, due to previous canceled appointments.

## 2021-07-25 ENCOUNTER — Telehealth: Payer: Self-pay

## 2021-07-25 NOTE — Telephone Encounter (Signed)
Called patients home number and spoke to Tappan, patient's grand-daughter, she spoke to patient and declines to schedule a AWV at this time. Dm/cma

## 2021-07-31 ENCOUNTER — Other Ambulatory Visit: Payer: Self-pay | Admitting: Cardiovascular Disease

## 2021-08-01 ENCOUNTER — Ambulatory Visit: Payer: Medicare Other | Admitting: Nurse Practitioner

## 2021-08-04 ENCOUNTER — Other Ambulatory Visit: Payer: Self-pay

## 2021-08-04 ENCOUNTER — Telehealth: Payer: Self-pay | Admitting: Nurse Practitioner

## 2021-08-04 MED ORDER — NITROGLYCERIN 0.4 MG SL SUBL: 0.4000 mg | SUBLINGUAL_TABLET | SUBLINGUAL | 1 refills | Status: DC | PRN

## 2021-08-04 MED ORDER — EZETIMIBE 10 MG PO TABS: 10.0000 mg | ORAL_TABLET | Freq: Every day | ORAL | 2 refills | Status: DC

## 2021-08-04 MED ORDER — ISOSORBIDE MONONITRATE ER 60 MG PO TB24: 90.0000 mg | ORAL_TABLET | Freq: Every day | ORAL | 2 refills | Status: DC

## 2021-08-04 MED ORDER — HYDROCHLOROTHIAZIDE 25 MG PO TABS: 25.0000 mg | ORAL_TABLET | Freq: Every day | ORAL | 2 refills | Status: DC

## 2021-08-04 MED ORDER — LOSARTAN POTASSIUM 50 MG PO TABS: 50.0000 mg | ORAL_TABLET | Freq: Every day | ORAL | 2 refills | Status: DC

## 2021-08-04 MED ORDER — REPATHA SURECLICK 140 MG/ML ~~LOC~~ SOAJ: 1.0000 "pen " | SUBCUTANEOUS | 11 refills | Status: DC

## 2021-08-04 NOTE — Telephone Encounter (Signed)
Medication quantity change can be discussed at follow up appointment that needs to be made this month. Pt was informed to return in 6 months and is due for appointment. Please call and schedule.

## 2021-08-04 NOTE — Telephone Encounter (Signed)
Pt caregiver called to report pt needs 90 day supply of her daily medicine instead of 30... she did not know which medication this was. There was recently a handful of meds refilled by another MD... so there may be some confusion.

## 2021-08-05 NOTE — Telephone Encounter (Signed)
Made an appt for Oct 28th

## 2021-08-11 ENCOUNTER — Other Ambulatory Visit: Payer: Self-pay | Admitting: Nurse Practitioner

## 2021-09-19 ENCOUNTER — Other Ambulatory Visit: Payer: Self-pay

## 2021-09-19 ENCOUNTER — Encounter: Payer: Self-pay | Admitting: Nurse Practitioner

## 2021-09-19 ENCOUNTER — Ambulatory Visit (INDEPENDENT_AMBULATORY_CARE_PROVIDER_SITE_OTHER): Payer: Medicare Other | Admitting: Nurse Practitioner

## 2021-09-19 VITALS — BP 116/60 | HR 76 | Temp 97.9°F | Wt 128.0 lb

## 2021-09-19 DIAGNOSIS — Z23 Encounter for immunization: Secondary | ICD-10-CM | POA: Diagnosis not present

## 2021-09-19 DIAGNOSIS — E782 Mixed hyperlipidemia: Secondary | ICD-10-CM | POA: Diagnosis not present

## 2021-09-19 DIAGNOSIS — I10 Essential (primary) hypertension: Secondary | ICD-10-CM

## 2021-09-19 DIAGNOSIS — M8580 Other specified disorders of bone density and structure, unspecified site: Secondary | ICD-10-CM

## 2021-09-19 DIAGNOSIS — D649 Anemia, unspecified: Secondary | ICD-10-CM

## 2021-09-19 NOTE — Patient Instructions (Addendum)
Schedule fasting lab appt. Need to be fasting 8hrs prior to blood draw. Ok to drink water. You will be contacted to schedule appt for bone density.

## 2021-09-19 NOTE — Progress Notes (Signed)
Subjective:  Patient ID: [name], female    DOB: Sep 19, 1928  Age: 87 y.o. MRN: [id number]  CC: Follow-up (Pt here to follow up on cholesterol medication. Pt is not fasting. )  HPI Accompanied by daughter. She declined to continue mammogram , PAP smears and colonoscopy screens.  Essential hypertension BP at goal with losartan, HCTZ, and atenolol BP Readings from Last 3 Encounters:  09/19/21 116/60  05/09/21 100/60  03/07/21 (!) 156/69     Osteopenia Agreed to repeat dexa scan. Advised to continue calcium 600mg  and vitamin D 1000IU daily Reports 32falls in last 65months. (tripped on pet x2 and unsteady door hinged-now fixed). Denies any injury from fall Discussed ways to prevent fall at home. Encouraged to use walker more often.  dexa scan ordered  Hyperlipidemia Managed by cardiology with repatha and zetia. Repeat lipid panel and CMP  Anemia Current use of plavix Denies any bruising, GU or GI bleed. Advised to avoid use of NSAIDs Ok to use tylenol for pain  repeat cbc  Wt Readings from Last 3 Encounters:  09/19/21 128 lb (58.1 kg)  05/30/21 130 lb 1.1 oz (59 kg)  05/09/21 131 lb (59.4 kg)    Reviewed past Medical, Social and Family history today.  Outpatient Medications Prior to Visit  Medication Sig Dispense Refill   albuterol (VENTOLIN HFA) 108 (90 Base) MCG/ACT inhaler Inhale 1-2 puffs into the lungs every 6 (six) hours as needed for wheezing or shortness of breath. 1 each 2   aspirin EC 81 MG tablet Take 81 mg by mouth daily.     calcium elemental as carbonate (TUMS ULTRA 1000) 400 MG chewable tablet Chew 1,000 mg by mouth daily as needed for heartburn.     cholecalciferol (VITAMIN D3) 25 MCG (1000 UNIT) tablet Take 1,000 Units by mouth daily.     clopidogrel (PLAVIX) 75 MG tablet TAKE 1 TABLET BY MOUTH  DAILY WITH BREAKFAST 30 tablet 0   Evolocumab (REPATHA SURECLICK) 140 MG/ML SOAJ Inject 1 pen into the skin every 14 (fourteen) days. 2 mL 11    ezetimibe (ZETIA) 10 MG tablet Take 1 tablet (10 mg total) by mouth daily. 90 tablet 2   hydrochlorothiazide (HYDRODIURIL) 25 MG tablet Take 1 tablet (25 mg total) by mouth daily. 90 tablet 2   isosorbide mononitrate (IMDUR) 60 MG 24 hr tablet Take 1.5 tablets (90 mg total) by mouth daily. 90 tablet 2   losartan (COZAAR) 50 MG tablet Take 1 tablet (50 mg total) by mouth daily. 90 tablet 2   Multiple Vitamins-Minerals (ZINC PO) Take 2 tablets by mouth daily.     naproxen sodium (ALEVE) 220 MG tablet Take 440 mg by mouth daily as needed (pain).     nitroGLYCERIN (NITROSTAT) 0.4 MG SL tablet Place 1 tablet (0.4 mg total) under the tongue every 5 (five) minutes as needed for chest pain. 75 tablet 1   atenolol (TENORMIN) 25 MG tablet TAKE 2 TABLETS BY MOUTH  DAILY 60 tablet 0   No facility-administered medications prior to visit.    ROS See HPI  Objective:  BP 116/60 (BP Location: Left Arm, Patient Position: Sitting, Cuff Size: Normal)   Pulse 76   Temp 97.9 F (36.6 C) (Temporal)   Wt 128 lb (58.1 kg)   SpO2 98%   BMI 27.70 kg/m   Physical Exam Vitals reviewed.  Constitutional:      General: She is not in acute distress. Cardiovascular:     Rate and Rhythm: Normal  rate and regular rhythm.     Pulses: Normal pulses.     Heart sounds: Normal heart sounds.  Pulmonary:     Effort: Pulmonary effort is normal.     Breath sounds: Normal breath sounds.  Abdominal:     General: Bowel sounds are normal.     Palpations: Abdomen is soft.  Musculoskeletal:     Right lower leg: No edema.     Left lower leg: No edema.  Skin:    General: Skin is warm and dry.     Findings: No bruising.  Neurological:     Mental Status: She is alert and oriented to person, place, and time.  Psychiatric:        Mood and Affect: Mood normal.        Behavior: Behavior normal.        Thought Content: Thought content normal.    Assessment & Plan:  This visit occurred during the SARS-CoV-2 public health  emergency.  Safety protocols were in place, including screening questions prior to the visit, additional usage of staff PPE, and extensive cleaning of exam room while observing appropriate contact time as indicated for disinfecting solutions.   [name] was seen today for follow-up.  Diagnoses and all orders for this visit:  Mixed hyperlipidemia -     Lipid panel; Future -     Comprehensive metabolic panel; Future  Flu vaccine need -     Flu Vaccine QUAD High Dose(Fluad)  Essential hypertension -     Comprehensive metabolic panel; Future -     TSH; Future -     atenolol (TENORMIN) 25 MG tablet; Take 2 tablets (50 mg total) by mouth daily.  Anemia, unspecified type -     CBC; Future  Osteopenia, unspecified location -     DG BONE DENSITY (DXA); Future   Problem List Items Addressed This Visit       Cardiovascular and Mediastinum   Essential hypertension    BP at goal with losartan, HCTZ, and atenolol BP Readings from Last 3 Encounters:  09/19/21 116/60  05/09/21 100/60  03/07/21 (!) 156/69         Relevant Medications   atenolol (TENORMIN) 25 MG tablet   Other Relevant Orders   Comprehensive metabolic panel   TSH     Musculoskeletal and Integument   Osteopenia    Agreed to repeat dexa scan. Advised to continue calcium 600mg  and vitamin D 1000IU daily Reports 31falls in last 71months. (tripped on pet x2 and unsteady door hinged-now fixed). Denies any injury from fall Discussed ways to prevent fall at home. Encouraged to use walker more often.  dexa scan ordered      Relevant Orders   DG BONE DENSITY (DXA)     Other   Anemia    Current use of plavix Denies any bruising, GU or GI bleed. Advised to avoid use of NSAIDs Ok to use tylenol for pain  repeat cbc      Relevant Orders   CBC   Hyperlipidemia - Primary    Managed by cardiology with repatha and zetia. Repeat lipid panel and CMP      Relevant Medications   atenolol (TENORMIN) 25 MG tablet    Other Relevant Orders   Lipid panel   Comprehensive metabolic panel   Other Visit Diagnoses     Flu vaccine need       Relevant Orders   Flu Vaccine QUAD High Dose(Fluad) (Completed)  Follow-up: Return in about 6 months (around 03/20/2022) for AWV.  [name], NP

## 2021-09-21 ENCOUNTER — Encounter: Payer: Self-pay | Admitting: Nurse Practitioner

## 2021-09-21 MED ORDER — ATENOLOL 25 MG PO TABS: 50.0000 mg | ORAL_TABLET | Freq: Every day | ORAL | 3 refills | Status: DC

## 2021-09-21 NOTE — Assessment & Plan Note (Signed)
Managed by cardiology with repatha and zetia. Repeat lipid panel and CMP

## 2021-09-21 NOTE — Assessment & Plan Note (Signed)
Current use of plavix Denies any bruising, GU or GI bleed. Advised to avoid use of NSAIDs Ok to use tylenol for pain  repeat cbc

## 2021-09-21 NOTE — Assessment & Plan Note (Signed)
BP at goal with losartan, HCTZ, and atenolol BP Readings from Last 3 Encounters:  09/19/21 116/60  05/09/21 100/60  03/07/21 (!) 156/69

## 2021-09-21 NOTE — Assessment & Plan Note (Signed)
Agreed to repeat dexa scan. Advised to continue calcium 600mg  and vitamin D 1000IU daily Reports 23falls in last 13months. (tripped on pet x2 and unsteady door hinged-now fixed). Denies any injury from fall Discussed ways to prevent fall at home. Encouraged to use walker more often.  dexa scan ordered

## 2021-09-26 ENCOUNTER — Other Ambulatory Visit (INDEPENDENT_AMBULATORY_CARE_PROVIDER_SITE_OTHER): Payer: Medicare Other

## 2021-09-26 ENCOUNTER — Other Ambulatory Visit: Payer: Self-pay

## 2021-09-26 DIAGNOSIS — I10 Essential (primary) hypertension: Secondary | ICD-10-CM | POA: Diagnosis not present

## 2021-09-26 DIAGNOSIS — E782 Mixed hyperlipidemia: Secondary | ICD-10-CM | POA: Diagnosis not present

## 2021-09-26 DIAGNOSIS — D649 Anemia, unspecified: Secondary | ICD-10-CM | POA: Diagnosis not present

## 2021-09-26 LAB — COMPREHENSIVE METABOLIC PANEL
ALT: 8 U/L (ref 0–35)
AST: 19 U/L (ref 0–37)
Albumin: 3.9 g/dL (ref 3.5–5.2)
Alkaline Phosphatase: 59 U/L (ref 39–117)
BUN: 24 mg/dL — ABNORMAL HIGH (ref 6–23)
CO2: 33 mEq/L — ABNORMAL HIGH (ref 19–32)
Calcium: 9.3 mg/dL (ref 8.4–10.5)
Chloride: 103 mEq/L (ref 96–112)
Creatinine, Ser: 1 mg/dL (ref 0.40–1.20)
GFR: 49.17 mL/min — ABNORMAL LOW (ref >60.00)
Glucose, Bld: 90 mg/dL (ref 70–99)
Potassium: 4.6 mEq/L (ref 3.5–5.1)
Sodium: 140 mEq/L (ref 135–145)
Total Bilirubin: 0.5 mg/dL (ref 0.2–1.2)
Total Protein: 6.6 g/dL (ref 6.0–8.3)

## 2021-09-26 LAB — LIPID PANEL
Cholesterol: 102 mg/dL (ref 0–200)
HDL: 35.6 mg/dL — ABNORMAL LOW (ref >39.00)
LDL Cholesterol: 46 mg/dL (ref 0–99)
NonHDL: 66.76
Total CHOL/HDL Ratio: 3
Triglycerides: 104 mg/dL (ref 0.0–149.0)
VLDL: 20.8 mg/dL (ref 0.0–40.0)

## 2021-09-26 LAB — CBC
HCT: 35.8 % — ABNORMAL LOW (ref 36.0–46.0)
Hemoglobin: 12.1 g/dL (ref 12.0–15.0)
MCHC: 33.7 g/dL (ref 30.0–36.0)
MCV: 93.3 fl (ref 78.0–100.0)
Platelets: 187 10*3/uL (ref 150.0–400.0)
RBC: 3.83 Mil/uL — ABNORMAL LOW (ref 3.87–5.11)
RDW: 14.2 % (ref 11.5–15.5)
WBC: 5.1 10*3/uL (ref 4.0–10.5)

## 2021-09-26 LAB — TSH: TSH: 2.99 u[IU]/mL (ref 0.35–5.50)

## 2021-10-05 ENCOUNTER — Other Ambulatory Visit: Payer: Self-pay | Admitting: Cardiovascular Disease

## 2021-10-20 ENCOUNTER — Other Ambulatory Visit: Payer: Self-pay

## 2021-10-20 ENCOUNTER — Encounter: Payer: Self-pay | Admitting: Nurse Practitioner

## 2021-10-20 ENCOUNTER — Ambulatory Visit: Payer: Medicare Other | Admitting: Nurse Practitioner

## 2021-10-20 ENCOUNTER — Ambulatory Visit (INDEPENDENT_AMBULATORY_CARE_PROVIDER_SITE_OTHER): Payer: Medicare Other

## 2021-10-20 VITALS — BP 100/58 | HR 61 | Temp 97.0°F | Ht <= 58 in | Wt 125.4 lb

## 2021-10-20 DIAGNOSIS — J069 Acute upper respiratory infection, unspecified: Secondary | ICD-10-CM

## 2021-10-20 LAB — POC INFLUENZA A&B (BINAX/QUICKVUE)
Influenza A, POC: NEGATIVE
Influenza B, POC: NEGATIVE

## 2021-10-20 NOTE — Progress Notes (Signed)
Subjective:  Patient ID: [name], female    DOB: February 11, 1928  Age: 87 y.o. MRN: [id number]  CC: Acute Visit (Pt c/o rash on her face around her mouth and chin x 1 week. Pt states it may be due to trying a new topical cream/ointment. )  URI  This is a new problem. The current episode started in the past 7 days. The problem has been unchanged. There has been no fever. Associated symptoms include congestion, coughing, a rash and sinus pain. Pertinent negatives include no abdominal pain, chest pain, diarrhea, dysuria, ear pain, headaches, joint pain, joint swelling, nausea, neck pain, plugged ear sensation, rhinorrhea, sneezing, sore throat, swollen glands, vomiting or wheezing. She has tried NSAIDs, acetaminophen and antihistamine for the symptoms. The treatment provided mild relief.  Rash around her lips noted prior to using lipbalm  Reviewed past Medical, Social and Family history today.  Outpatient Medications Prior to Visit  Medication Sig Dispense Refill   albuterol (VENTOLIN HFA) 108 (90 Base) MCG/ACT inhaler Inhale 1-2 puffs into the lungs every 6 (six) hours as needed for wheezing or shortness of breath. 1 each 2   aspirin EC 81 MG tablet Take 81 mg by mouth daily.     atenolol (TENORMIN) 25 MG tablet Take 2 tablets (50 mg total) by mouth daily. 180 tablet 3   calcium elemental as carbonate (TUMS ULTRA 1000) 400 MG chewable tablet Chew 1,000 mg by mouth daily as needed for heartburn.     cholecalciferol (VITAMIN D3) 25 MCG (1000 UNIT) tablet Take 1,000 Units by mouth daily.     clopidogrel (PLAVIX) 75 MG tablet TAKE 1 TABLET BY MOUTH  DAILY WITH BREAKFAST 30 tablet 0   Evolocumab (REPATHA SURECLICK) 140 MG/ML SOAJ Inject 1 pen into the skin every 14 (fourteen) days. 2 mL 11   ezetimibe (ZETIA) 10 MG tablet Take 1 tablet (10 mg total) by mouth daily. 90 tablet 2   hydrochlorothiazide (HYDRODIURIL) 25 MG tablet Take 1 tablet (25 mg total) by mouth daily. 90 tablet 2   isosorbide  mononitrate (IMDUR) 60 MG 24 hr tablet Take 1.5 tablets (90 mg total) by mouth daily. 90 tablet 2   losartan (COZAAR) 50 MG tablet Take 1 tablet (50 mg total) by mouth daily. 90 tablet 2   Multiple Vitamins-Minerals (ZINC PO) Take 2 tablets by mouth daily.     nitroGLYCERIN (NITROSTAT) 0.4 MG SL tablet DISSOLVE 1 TABLET UNDER THE TONGUE EVERY 5 MINUTES AS  NEEDED FOR CHEST PAIN. MAX  OF 3 TABLETS IN 15 MINUTES. CALL 911 IF PAIN PERSISTS. 100 tablet 3   No facility-administered medications prior to visit.    ROS See HPI  Objective:  BP (!) 100/58 (BP Location: Right Arm, Patient Position: Sitting, Cuff Size: Normal)   Pulse 61   Temp (!) 97 F (36.1 C) (Temporal)   Ht 4\' 9"  (1.448 m)   Wt 125 lb 6.4 oz (56.9 kg)   SpO2 97%   BMI 27.14 kg/m   Physical Exam Vitals reviewed.  Constitutional:      General: She is not in acute distress. Cardiovascular:     Rate and Rhythm: Normal rate and regular rhythm.     Pulses: Normal pulses.     Heart sounds: Normal heart sounds.  Pulmonary:     Effort: Pulmonary effort is normal.     Breath sounds: Normal breath sounds.  Skin:    Findings: Rash present. No erythema.     Comments: Dry scalped  lesion on upper lip  Neurological:     Mental Status: She is alert and oriented to person, place, and time.   Assessment & Plan:  This visit occurred during the SARS-CoV-2 public health emergency.  Safety protocols were in place, including screening questions prior to the visit, additional usage of staff PPE, and extensive cleaning of exam room while observing appropriate contact time as indicated for disinfecting solutions.   [name] was seen today for acute visit.  Diagnoses and all orders for this visit:  Viral upper respiratory tract infection -     POC Influenza A&B (Binax test) -     Cancel: POC COVID-19 -     DG Chest 2 View  She declined COVID rapid antigen test. Negative flu test Go to lab for CXR: no acute finding. Encourage adequate  oral hydration. Use over-the-counter  "cold" medicines  such as coricidin HBp or "Mucinex"  for cough and congestion.  Avoid decongestants if you have high blood pressure. Use" Delsym" or" Robitussin" cough syrup varietis for cough. You can use plain "Tylenol"  for fever, chills and achyness.  Problem List Items Addressed This Visit   None Visit Diagnoses     Viral upper respiratory tract infection    -  Primary   Relevant Orders   POC Influenza A&B (Binax test) (Completed)   DG Chest 2 View (Completed)       Follow-up: No follow-ups on file.  [name], NP

## 2021-10-20 NOTE — Patient Instructions (Addendum)
Negative flu test Go to lab for CXR Encourage adequate oral hydration.  Use over-the-counter  "cold" medicines  such as "Tylenol cold",  "Mucinex" or" Mucinex D"  for cough and congestion.  Avoid decongestants if you have high blood pressure. Use" Delsym" or" Robitussin" cough syrup varietis for cough. You can use plain "Tylenol"  for fever, chills and achyness.   "Common cold" symptoms are usually triggered by a virus.  The antibiotics are usually not necessary. On average, a" viral cold" illness would take 4-10 days to resolve. Please, make an appointment if you are not better or if you're worse.

## 2021-10-24 ENCOUNTER — Other Ambulatory Visit: Payer: Self-pay

## 2021-10-24 ENCOUNTER — Encounter: Payer: Self-pay | Admitting: Cardiovascular Disease

## 2021-10-24 ENCOUNTER — Ambulatory Visit: Payer: Medicare Other | Admitting: Cardiovascular Disease

## 2021-10-24 VITALS — BP 90/54 | HR 68 | Ht <= 58 in | Wt 123.0 lb

## 2021-10-24 DIAGNOSIS — E782 Mixed hyperlipidemia: Secondary | ICD-10-CM | POA: Diagnosis not present

## 2021-10-24 DIAGNOSIS — I10 Essential (primary) hypertension: Secondary | ICD-10-CM | POA: Diagnosis not present

## 2021-10-24 DIAGNOSIS — I2510 Atherosclerotic heart disease of native coronary artery without angina pectoris: Secondary | ICD-10-CM | POA: Diagnosis not present

## 2021-10-24 NOTE — Patient Instructions (Signed)
Medication Instructions:  Your physician recommends that you continue on your current medications as directed. Please refer to the Current Medication list given to you today.  *If you need a refill on your cardiac medications before your next appointment, please call your pharmacy*   Lab Work: NONE If you have labs (blood work) drawn today and your tests are completely normal, you will receive your results only by: MyChart Message (if you have MyChart) OR A paper copy in the mail If you have any lab test that is abnormal or we need to change your treatment, we will call you to review the results.   Testing/Procedures: NONE   Follow-Up: At [facility], you and your health needs are our priority.  As part of our continuing mission to provide you with exceptional heart care, we have created designated Provider Care Teams.  These Care Teams include your primary Cardiologist (physician) and Advanced Practice Providers (APPs -  Physician Assistants and Nurse Practitioners) who all work together to provide you with the care you need, when you need it.  We recommend signing up for the patient portal called "MyChart".  Sign up information is provided on this After Visit Summary.  MyChart is used to connect with patients for Virtual Visits (Telemedicine).  Patients are able to view lab/test results, encounter notes, upcoming appointments, etc.  Non-urgent messages can be sent to your provider as well.   To learn more about what you can do with MyChart, go to [email or web address].    Your next appointment:   6 month(s)  The format for your next appointment:   In Person  Provider:   Tereso Newcomer, PA-C       :1}
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
## 2022-01-05 ENCOUNTER — Other Ambulatory Visit: Payer: Self-pay | Admitting: Nurse Practitioner

## 2022-02-19 ENCOUNTER — Encounter: Payer: Self-pay | Admitting: Physician Assistant

## 2022-02-19 ENCOUNTER — Telehealth: Payer: Self-pay | Admitting: Cardiovascular Disease

## 2022-02-19 ENCOUNTER — Ambulatory Visit: Payer: Medicare Other | Admitting: Physician Assistant

## 2022-02-19 VITALS — BP 102/60 | HR 77 | Ht <= 58 in | Wt 128.0 lb

## 2022-02-19 DIAGNOSIS — I25110 Atherosclerotic heart disease of native coronary artery with unstable angina pectoris: Secondary | ICD-10-CM | POA: Diagnosis not present

## 2022-02-19 DIAGNOSIS — I952 Hypotension due to drugs: Secondary | ICD-10-CM

## 2022-02-19 DIAGNOSIS — R42 Dizziness and giddiness: Secondary | ICD-10-CM | POA: Diagnosis not present

## 2022-02-19 DIAGNOSIS — I10 Essential (primary) hypertension: Secondary | ICD-10-CM

## 2022-02-19 DIAGNOSIS — E782 Mixed hyperlipidemia: Secondary | ICD-10-CM

## 2022-02-19 MED ORDER — LOSARTAN POTASSIUM 25 MG PO TABS: 25.0000 mg | ORAL_TABLET | Freq: Every day | ORAL | 3 refills | Status: DC
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
## 2022-02-19 NOTE — Telephone Encounter (Signed)
Spoke with patient's daughter [name] (on [place]). ? ?[name] reports patient experienced an episode of dizziness around 8AM this morning. She has not had another episode since then. [name] states this concerns her because she had a similar episode prior to her having a stent placed.  ? ?[name] reports patient denies any chest pain, SOB. No BP/HR readings obtained. ? ?Patient has an appointment to see Chelsea Aus, PA today at 2:20PM. ? ?Egbert Garibaldi to take patient to ED if she experiences another episode of dizziness, feeling like she might faint, or if she has chest pain/SOB prior to arriving at appt today. ? ?[name] verbalized understanding of the above. ?

## 2022-02-19 NOTE — Telephone Encounter (Signed)
STAT if patient feels like he/she is going to faint  ? ?Are you dizzy now? No  ? ?Do you feel faint or have you passed out? No ? ?Do you have any other symptoms? No ? ?Have you checked your HR and BP (record if available)? No ? ?Last episode of dizziness was this morning ?Pt was put schedule for today @ 2:20 to see PA Vin ?

## 2022-02-19 NOTE — Patient Instructions (Signed)
Medication Instructions:  ?DECREASE Losartan to 25mg  take 1 tablet once a day  ?*If you need a refill on your cardiac medications before your next appointment, please call your pharmacy* ? ? ?Lab Work: ?None Ordered  ? ? ?Testing/Procedures: ?None Ordered ? ? ?Follow-Up: ?At [facility], you and your health needs are our priority.  As part of our continuing mission to provide you with exceptional heart care, we have created designated Provider Care Teams.  These Care Teams include your primary Cardiologist (physician) and Advanced Practice Providers (APPs -  Physician Assistants and Nurse Practitioners) who all work together to provide you with the care you need, when you need it. ? ?We recommend signing up for the patient portal called "MyChart".  Sign up information is provided on this After Visit Summary.  MyChart is used to connect with patients for Virtual Visits (Telemedicine).  Patients are able to view lab/test results, encounter notes, upcoming appointments, etc.  Non-urgent messages can be sent to your provider as well.   ?To learn more about what you can do with MyChart, go to [email or web address].   ? ?Your next appointment:   ?AS SCHEDULED ? ?The format for your next appointment:   ?In Person ? ?Provider:   ?[name], MD   ? ? ?Other Instructions ?  ?

## 2022-03-01 ENCOUNTER — Other Ambulatory Visit: Payer: Self-pay | Admitting: Cardiovascular Disease

## 2022-03-17 ENCOUNTER — Other Ambulatory Visit: Payer: Self-pay | Admitting: Cardiovascular Disease

## 2022-03-20 ENCOUNTER — Encounter: Payer: Self-pay | Admitting: Nurse Practitioner

## 2022-03-20 ENCOUNTER — Ambulatory Visit (INDEPENDENT_AMBULATORY_CARE_PROVIDER_SITE_OTHER): Payer: Medicare Other | Admitting: Nurse Practitioner

## 2022-03-20 VITALS — BP 126/60 | HR 60 | Temp 97.5°F | Ht <= 58 in | Wt 124.8 lb

## 2022-03-20 DIAGNOSIS — E782 Mixed hyperlipidemia: Secondary | ICD-10-CM

## 2022-03-20 DIAGNOSIS — I5032 Chronic diastolic (congestive) heart failure: Secondary | ICD-10-CM

## 2022-03-20 DIAGNOSIS — E559 Vitamin D deficiency, unspecified: Secondary | ICD-10-CM

## 2022-03-20 DIAGNOSIS — Z0000 Encounter for general adult medical examination without abnormal findings: Secondary | ICD-10-CM | POA: Diagnosis not present

## 2022-03-20 DIAGNOSIS — Z1231 Encounter for screening mammogram for malignant neoplasm of breast: Secondary | ICD-10-CM

## 2022-03-20 DIAGNOSIS — M8580 Other specified disorders of bone density and structure, unspecified site: Secondary | ICD-10-CM

## 2022-03-20 DIAGNOSIS — I10 Essential (primary) hypertension: Secondary | ICD-10-CM

## 2022-03-20 LAB — COMPREHENSIVE METABOLIC PANEL
ALT: 7 U/L (ref 0–35)
AST: 20 U/L (ref 0–37)
Albumin: 3.9 g/dL (ref 3.5–5.2)
Alkaline Phosphatase: 52 U/L (ref 39–117)
BUN: 18 mg/dL (ref 6–23)
CO2: 32 mEq/L (ref 19–32)
Calcium: 9.6 mg/dL (ref 8.4–10.5)
Chloride: 99 mEq/L (ref 96–112)
Creatinine, Ser: 0.77 mg/dL (ref 0.40–1.20)
GFR: 67.05 mL/min (ref >60.00)
Glucose, Bld: 89 mg/dL (ref 70–99)
Potassium: 4.7 mEq/L (ref 3.5–5.1)
Sodium: 137 mEq/L (ref 135–145)
Total Bilirubin: 0.5 mg/dL (ref 0.2–1.2)
Total Protein: 6.9 g/dL (ref 6.0–8.3)

## 2022-03-20 LAB — CBC
HCT: 37.2 % (ref 36.0–46.0)
Hemoglobin: 12.6 g/dL (ref 12.0–15.0)
MCHC: 33.8 g/dL (ref 30.0–36.0)
MCV: 92.1 fl (ref 78.0–100.0)
Platelets: 172 10*3/uL (ref 150.0–400.0)
RBC: 4.04 Mil/uL (ref 3.87–5.11)
RDW: 13.9 % (ref 11.5–15.5)
WBC: 4.9 10*3/uL (ref 4.0–10.5)

## 2022-03-20 LAB — VITAMIN D 25 HYDROXY (VIT D DEFICIENCY, FRACTURES): VITD: 66.44 ng/mL (ref 30.00–100.00)

## 2022-03-20 NOTE — Assessment & Plan Note (Signed)
Repeat lipid panel ?

## 2022-03-20 NOTE — Progress Notes (Signed)
? ?Annual Wellness Visit ? ?Patient: [name], Female    DOB: 09-22-1928, 88 y.o.   MRN: [id number] ? ?Subjective  ?Chief Complaint  ?Patient presents with  ? Follow-up  ?  AWV  ?Accompanied by daughter. ? ?ACURA DISALVO is a 88 y.o. female who presents today for her Annual Wellness Visit. ?She reports consuming a general diet. No home exercise.  She generally feels well. She reports sleeping well. She does not have additional problems to discuss today.  ? ?HPI ?[name] is accompanied by her daughter today. 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 Reports she is struggling to adjust, hence tennds to take daytime naps and up at night. She is home with her dog, her grand daughter and son in law. She ambulates with a cane. She is not interested in joining a senior center. She wants to continue with mammogram screen and bone density. ?  ?Vision:Within last year and Dental: No current dental problems: has upper and lower dentures, recently replaced but still using old pair due to improper fitting. Plans to return new pair to be adjusted. ? ?Hearing Screening  ? 125Hz  250Hz  500Hz  1000Hz  2000Hz  3000Hz  4000Hz   ?Right ear 0 0 0 0 Pass Pass Pass  ?Left ear 0 0 0 0 0 50 0  ? ?Vision Screening  ? Right eye Left eye Both eyes  ?Without correction 20/50 20/40 20/40   ?With correction     ?  ? ?  03/20/2022  ? 10:30 AM 09/19/2021  ?  2:50 PM  ?MMSE - Mini Mental State Exam  ?Orientation to time 3 3  ?Orientation to Place 5 5  ?Registration 3 3  ?Attention/ Calculation 0 0  ?Recall 2 2  ?Language- name 2 objects 2 2  ?Language- repeat 1 1  ?Language- follow 3 step command 3 3  ?Language- read & follow direction 1 1  ?Write a sentence 0 0  ?Copy design 1 1  ?Total score 21 21  ?  ?Vitamin D deficiency ?Repeat vit. D ? ?Osteopenia ?Maintain calcium 600mg  and vit. D 1000Iu daily ?Repeat dexa scan ? ?Hyperlipidemia ?Repeat lipid panel ? ?Chronic heart failure with preserved ejection fraction ([place]) ?Euvolemic ?stable  BP ?Under the care of Dr. Acie Fredrickson ?BP Readings from Last 3 Encounters:  ?03/20/22 126/60  ?02/19/22 102/60  ?10/24/21 (!) 90/54  ? ?Wt Readings from Last 3 Encounters:  ?03/20/22 124 lb 12.8 oz (56.6 kg)  ?02/19/22 128 lb (58.1 kg)  ?10/24/21 123 lb (55.8 kg)  ? ?  ?Patient Active Problem List  ? Diagnosis Date Noted  ? Statin myopathy 05/30/2021  ? Chronic diastolic CHF (congestive heart failure) ([place]) 06/07/2020  ? Angina pectoris ([place]) 06/06/2020  ? Progressive angina ([place]) 05/24/2020  ? Unstable angina ([place]) 05/24/2020  ? Anemia 06/09/2013  ? Asthma 06/09/2013  ? Dyspnea and respiratory abnormality 06/09/2013  ? Psoriasis 01/31/2013  ? Essential hypertension 07/08/2012  ? Vitamin D deficiency 12/22/2011  ? Concussion 11/25/2011  ? Multiple fractures of ribs of left side 11/25/2011  ? Grade 2 splenic laceration 11/25/2011  ? GERD (gastroesophageal reflux disease) 11/25/2011  ? Atherosclerotic heart disease of native coronary artery without angina pectoris 05/28/2011  ? Hyperlipidemia 12/05/2008  ? ASTHMA, UNSPECIFIED 12/05/2008  ? Diverticulosis of colon 12/05/2008  ? HEMOCCULT POSITIVE STOOL 12/05/2008  ? Arthropathy 12/18/2006  ? Osteopenia 12/18/2006  ? ?Social History  ? ?Tobacco Use  ? Smoking status: Never  ? Smokeless tobacco: Never  ?Vaping Use  ?  Vaping Use: Never used  ?Substance Use Topics  ? Alcohol use: No  ?  Alcohol/week: 0.0 standard drinks  ? Drug use: No  ? ?Medications: ?Outpatient Medications Prior to Visit  ?Medication Sig  ? albuterol (VENTOLIN HFA) 108 (90 Base) MCG/ACT inhaler Inhale 1-2 puffs into the lungs every 6 (six) hours as needed for wheezing or shortness of breath.  ? aspirin EC 81 MG tablet Take 81 mg by mouth daily.  ? atenolol (TENORMIN) 25 MG tablet Take 2 tablets (50 mg total) by mouth daily.  ? calcium elemental as carbonate (TUMS ULTRA 1000) 400 MG chewable tablet Chew 1,000 mg by mouth daily as needed for heartburn.  ? cholecalciferol (VITAMIN D3) 25 MCG (1000 UNIT) tablet  Take 1,000 Units by mouth daily.  ? clopidogrel (PLAVIX) 75 MG tablet TAKE 1 TABLET BY MOUTH  DAILY WITH BREAKFAST  ? ezetimibe (ZETIA) 10 MG tablet Take 1 tablet (10 mg total) by mouth daily.  ? hydrochlorothiazide (HYDRODIURIL) 25 MG tablet TAKE 1 TABLET BY MOUTH  DAILY  ? isosorbide mononitrate (IMDUR) 60 MG 24 hr tablet TAKE 1 AND 1/2 TABLETS BY  MOUTH DAILY  ? losartan (COZAAR) 25 MG tablet Take 1 tablet (25 mg total) by mouth daily.  ? Multiple Vitamins-Minerals (ZINC PO) Take 2 tablets by mouth daily.  ? nitroGLYCERIN (NITROSTAT) 0.4 MG SL tablet DISSOLVE 1 TABLET UNDER THE TONGUE EVERY 5 MINUTES AS  NEEDED FOR CHEST PAIN. MAX  OF 3 TABLETS IN 15 MINUTES. CALL 911 IF PAIN PERSISTS.  ? ?No facility-administered medications prior to visit.  ?  ?Allergies  ?Allergen Reactions  ? Codeine Cough  ? Crestor [Rosuvastatin Calcium] Cough  ? Lisinopril Cough  ? Pantoprazole Sodium Cough  ? Pravastatin Cough  ? Simvastatin Cough  ? ?Patient Care Team: ?Shaquel Josephson, Charlene Brooke, NP as PCP - General (Internal Medicine) ?Nahser, Wonda Cheng, MD as PCP - Cardiology (Cardiology) ?Germaine Pomfret, [facility] as Pharmacist (Pharmacist) ?Sharmon Revere as Physician Assistant (Cardiology) ?Pa, [facility] as Consulting Physician (Ophthalmology) ? ?Review of Systems  ?Constitutional:  Negative for fever, malaise/fatigue and weight loss.  ?HENT:  Negative for congestion and sore throat.   ?Eyes:   ?     Negative for visual changes  ?Respiratory:  Negative for cough and shortness of breath.   ?Cardiovascular:  Negative for chest pain, palpitations and leg swelling.  ?Gastrointestinal:  Negative for blood in stool, constipation, diarrhea and heartburn.  ?Genitourinary:  Negative for dysuria, frequency and urgency.  ?Musculoskeletal:  Negative for falls, joint pain and myalgias.  ?Skin:  Negative for rash.  ?Neurological:  Negative for dizziness, sensory change, weakness and headaches.  ?Endo/Heme/Allergies:  Does not bruise/bleed  easily.  ?Psychiatric/Behavioral:  Negative for depression, hallucinations, memory loss, substance abuse and suicidal ideas. The patient is not nervous/anxious and does not have insomnia.   ?All other systems reviewed and are negative. ? ?  ? ?Objective  ?BP 126/60 (BP Location: Left Arm, Patient Position: Sitting, Cuff Size: Normal)   Pulse 60   Temp (!) 97.5 ?F (36.4 ?C) (Temporal)   Ht 4\' 9"  (1.448 m)   Wt 124 lb 12.8 oz (56.6 kg)   SpO2 97%   BMI 27.01 kg/m?  ?BP Readings from Last 3 Encounters:  ?03/20/22 126/60  ?02/19/22 102/60  ?10/24/21 (!) 90/54  ? ?Wt Readings from Last 3 Encounters:  ?03/20/22 124 lb 12.8 oz (56.6 kg)  ?02/19/22 128 lb (58.1 kg)  ?10/24/21 123 lb (55.8 kg)  ? ?  Physical Exam ?Vitals reviewed.  ?Cardiovascular:  ?   Rate and Rhythm: Normal rate and regular rhythm.  ?   Pulses: Normal pulses.  ?   Heart sounds: Normal heart sounds.  ?Pulmonary:  ?   Effort: Pulmonary effort is normal.  ?   Breath sounds: Normal breath sounds.  ?Abdominal:  ?   General: Bowel sounds are normal.  ?   Palpations: Abdomen is soft.  ?Musculoskeletal:  ?   Right lower leg: No edema.  ?   Left lower leg: No edema.  ?Skin: ?   General: Skin is warm and dry.  ?Neurological:  ?   Mental Status: She is alert and oriented to person, place, and time.  ?   Cranial Nerves: Cranial nerves 2-12 are intact.  ?   Motor: Weakness present. No tremor.  ?   Coordination: Romberg sign positive. Coordination normal. Finger-Nose-Finger Test and Heel to [facility] Test normal.  ?   Gait: Gait abnormal.  ?   Comments: slow gait, short strides with minimal arm swing when walking. ?Sit to stand test: <10seconds, use of chair arm rest to assist standing  ?Psychiatric:     ?   Mood and Affect: Mood normal.     ?   Behavior: Behavior normal.     ?   Thought Content: Thought content normal.  ? ?Most recent functional status assessment: ? ?  03/20/2022  ? 10:15 AM  ?In your present state of health, do you have any difficulty performing the  following activities:  ?Hearing? 0  ?Vision? 1  ?Difficulty concentrating or making decisions? 1  ?Walking or climbing stairs? 1  ?Dressing or bathing? 0  ?Doing errands, shopping? 1  ? ?Most recent fall risk as

## 2022-03-20 NOTE — Assessment & Plan Note (Signed)
Repeat vit. D °

## 2022-03-20 NOTE — Assessment & Plan Note (Signed)
Maintain calcium 600mg  and vit. D 1000Iu daily ?Repeat dexa scan ?

## 2022-03-20 NOTE — Patient Instructions (Addendum)
Schedule appt for bone dexa scan and mammogram. ?Go to lab. ?Schedule appt for TDAP and shingrix vaccine at [facility] pharmacy ? ? ?Exercises to do While Sitting ? ?Exercises that you do while sitting (chair exercises) can give you many of the same benefits as full exercise. Benefits include strengthening your heart, burning calories, and keeping muscles and joints healthy. Exercise can also improve your mood and help with depression and anxiety. ?You may benefit from chair exercises if you are unable to do standing exercises due to: ?Diabetic foot pain. ?Obesity. ?Illness. ?Arthritis. ?Recovery from surgery or injury. ?Breathing problems. ?Balance problems. ?Another type of disability. ?Before starting chair exercises, check with your health care provider or a physical therapist to find out how much exercise you can tolerate and which exercises are safe for you. If your health care provider approves: ?Start out slowly and build up over time. Aim to work up to about 10-20 minutes for each exercise session. ?Make exercise part of your daily routine. ?Drink water when you exercise. Do not wait until you are thirsty. Drink every 10-15 minutes. ?Stop exercising right away if you have pain, nausea, shortness of breath, or dizziness. ?If you are exercising in a wheelchair, make sure to lock the wheels. ?Ask your health care provider whether you can do tai chi or yoga. Many positions in these mind-body exercises can be modified to do while seated. ?Warm-up ?Before starting other exercises: ?Sit up as straight as you can. Have your knees bent at 90 degrees, which is the shape of the capital letter "L." Keep your feet flat on the floor. ?Sit at the front edge of your chair, if you can. ?Pull in (tighten) the muscles in your abdomen and stretch your spine and neck as straight as you can. Hold this position for a few minutes. ?Breathe in and out evenly. Try to concentrate on your breathing, and relax your  mind. ?Stretching ?Exercise A: Arm stretch ?Hold your arms out straight in front of your body. ?Bend your hands at the wrist with your fingers pointing up, as if signaling someone to stop. Notice the slight tension in your forearms as you hold the position. ?Keeping your arms out and your hands bent, rotate your hands outward as far as you can and hold this stretch. Aim to have your thumbs pointing up and your pinkie fingers pointing down. ?Slowly repeat arm stretches for one minute as tolerated. ?Exercise B: Leg stretch ?If you can move your legs, try to "draw" letters on the floor with the toes of your foot. Write your name with one foot. ?Write your name with the toes of your other foot. ?Slowly repeat the movements for one minute as tolerated. ?Exercise C: Reach for the sky ?Reach your hands as far over your head as you can to stretch your spine. ?Move your hands and arms as if you are climbing a rope. ?Slowly repeat the movements for one minute as tolerated. ?Range of motion exercises ?Exercise A: Shoulder roll ?Let your arms hang loosely at your sides. ?Lift just your shoulders up toward your ears, then let them relax back down. ?When your shoulders feel loose, rotate your shoulders in backward and forward circles. ?Do shoulder rolls slowly for one minute as tolerated. ?Exercise B: March in place ?As if you are marching, pump your arms and lift your legs up and down. Lift your knees as high as you can. ?If you are unable to lift your knees, just pump your arms and move your  ankles and feet up and down. ?March in place for one minute as tolerated. ?Exercise C: Seated jumping jacks ?Let your arms hang down straight. ?Keeping your arms straight, lift them up over your head. Aim to point your fingers to the ceiling. ?While you lift your arms, straighten your legs and slide your heels along the floor to your sides, as wide as you can. ?As you bring your arms back down to your sides, slide your legs back  together. ?If you are unable to use your legs, just move your arms. ?Slowly repeat seated jumping jacks for one minute as tolerated. ?Strengthening exercises ?Exercise A: Shoulder squeeze ?Hold your arms straight out from your body to your sides, with your elbows bent and your fists pointed at the ceiling. ?Keeping your arms in the bent position, move them forward so your elbows and forearms meet in front of your face. ?Open your arms back out as wide as you can with your elbows still bent, until you feel your shoulder blades squeezing together. Hold for 5 seconds. ?Slowly repeat the movements forward and backward for one minute as tolerated. ?Contact a health care provider if: ?You have to stop exercising due to any of the following: ?Pain. ?Nausea. ?Shortness of breath. ?Dizziness. ?Fatigue. ?You have significant pain or soreness after exercising. ?Get help right away if: ?You have chest pain. ?You have difficulty breathing. ?These symptoms may represent a serious problem that is an emergency. Do not wait to see if the symptoms will go away. Get medical help right away. Call your local emergency services (911 in the [place] Do not drive yourself to the hospital. ?Summary ?Exercises that you do while sitting (chair exercises) can strengthen your heart, burn calories, and keep muscles and joints healthy. ?You may benefit from chair exercises if you are unable to do standing exercises due to diabetic foot pain, obesity, recovery from surgery or injury, or other conditions. ?Before starting chair exercises, check with your health care provider or a physical therapist to find out how much exercise you can tolerate and which exercises are safe for you. ?This information is not intended to replace advice given to you by your health care provider. Make sure you discuss any questions you have with your health care provider. ?Document Revised: 01/05/2021 Document Reviewed: 01/05/2021 ?Elsevier Patient Education ? [place]. ?Sit-to-Stand Exercise ? ?The sit-to-stand exercise (also known as the chair stand or chair rise exercise) strengthens your lower body and helps you maintain or improve your mobility and independence. The end goal is to do the sit-to-stand exercise without using your hands. This will be easier as you become stronger. You should always talk with your health care provider before starting any exercise program, especially if you have had recent surgery. ?Do the exercise exactly as told by your health care provider and adjust it as directed. It is normal to feel mild stretching, pulling, tightness, or discomfort as you do this exercise, but you should stop right away if you feel sudden pain or your pain gets worse. Do not begin doing this exercise until told by your health care provider. ?What the sit-to-stand exercise does ?The sit-to-stand exercise helps to strengthen the muscles in your thighs and the muscles in the center of your body that give you stability (core muscles). This exercise is especially helpful if: ?You have had knee or hip surgery. ?You have trouble getting up from a chair, out of a car, or off the toilet due to muscle weakness. ?How  to do the sit-to-stand exercise ?Sit toward the front edge of a sturdy chair without armrests. Your knees should be bent and your feet should be flat on the floor and shoulder-width apart and underneath your hips. ?Place your hands lightly on each side of the seat. Keep your back and neck as straight as possible, with your chest slightly forward. ?Breathe in slowly. Lean forward and slightly shift your weight to the front of your feet. ?Breathe out as you slowly stand up. Try not to support any weight with your hands. ?Stand and pause for a full breath in and out. ?Breathe in as you sit down slowly. Tighten your core and abdominal muscles to control your lowering as much as possible. You should lower yourself back to the chair slowly, not just drop back into  the seat. ?Breathe out slowly. ?Do this exercise 10-15 times. If needed, do it fewer times until you build up strength. ?Rest for 1 minute, then do another set of 10-15 repetitions. ?To change the difficulty of the sit-to-stand exercise ?If

## 2022-03-20 NOTE — Assessment & Plan Note (Signed)
Euvolemic ?stable BP ?Under the care of Dr. Elease Hashimoto ?BP Readings from Last 3 Encounters:  ?03/20/22 126/60  ?02/19/22 102/60  ?10/24/21 (!) 90/54  ? ?Wt Readings from Last 3 Encounters:  ?03/20/22 124 lb 12.8 oz (56.6 kg)  ?02/19/22 128 lb (58.1 kg)  ?10/24/21 123 lb (55.8 kg)  ? ? ?

## 2022-03-23 ENCOUNTER — Telehealth: Payer: Self-pay | Admitting: *Deleted

## 2022-03-23 NOTE — Chronic Care Management (AMB) (Signed)
?  Care Management  ? ?Note ? ?03/23/2022 ?Name: [name] MRN: [id number] DOB: May 08, 1928 ? ?[name] is a 88 y.o. year old female who is a primary care patient of Nche, Charlene Brooke, NP. I reached out to Leodis Binet by phone today offer care coordination services.  ? ?[name] was given information about care management services today including:  ?Care management services include personalized support from designated clinical staff supervised by her physician, including individualized plan of care and coordination with other care providers ?24/7 contact phone numbers for assistance for urgent and routine care needs. ?The patient may stop care management services at any time by phone call to the office staff. ? ?Patient agreed to services and verbal consent obtained.  ? ?Follow up plan: ?Telephone appointment with care management team member scheduled for: 04/30/2022 ? ?Colter Magowan, CCMA ?Care Guide, Embedded Care Coordination ?[facility]  Care Management  ?Direct Dial: [phone] ? ? ?

## 2022-04-15 ENCOUNTER — Other Ambulatory Visit: Payer: Self-pay | Admitting: Physician Assistant

## 2022-04-27 ENCOUNTER — Telehealth: Payer: Self-pay

## 2022-04-27 NOTE — Progress Notes (Signed)
    Chronic Care Management Pharmacy Assistant   Name: [name]  MRN: [id number] DOB: August 18, 1928  Chart review for the clinical pharmacist for 04/30/2022 at 1:00 pm.  Conditions to be addressed/monitored: CHF, CAD, HTN, HLD, GERD, Asthma, Osteopenia, and Progressive angina, Vitamin D deficiency, Anemia  Primary concerns for visit include: None ID   Recent office visits:  03/20/2022 Wilfred Lacy NP (PCP) Medicare Wellness Completed, No medication Changes noted, return in 1 year   Recent consult visits:  04/13/2022 Dr. Gershon Crane MD (Ophthalmology) No Medication Changes noted 02/19/2022 Leanor Kail PA (Cardiology) Decrease Losartan to 25 mg daily  Hospital visits: None in previous 6 months  Questions for Clinical Pharmacist:   Left Voice message to do initial question prior to patient appointment on 04/30/2022  for CCM at 1:00 pm with Junius Argyle the Clinical pharmacist.       Medications: Outpatient Encounter Medications as of 04/27/2022  Medication Sig   albuterol (VENTOLIN HFA) 108 (90 Base) MCG/ACT inhaler Inhale 1-2 puffs into the lungs every 6 (six) hours as needed for wheezing or shortness of breath.   aspirin EC 81 MG tablet Take 81 mg by mouth daily.   atenolol (TENORMIN) 25 MG tablet Take 2 tablets (50 mg total) by mouth daily.   calcium elemental as carbonate (TUMS ULTRA 1000) 400 MG chewable tablet Chew 1,000 mg by mouth daily as needed for heartburn.   cholecalciferol (VITAMIN D3) 25 MCG (1000 UNIT) tablet Take 1,000 Units by mouth daily.   clopidogrel (PLAVIX) 75 MG tablet TAKE 1 TABLET BY MOUTH  DAILY WITH BREAKFAST   ezetimibe (ZETIA) 10 MG tablet Take 1 tablet (10 mg total) by mouth daily.   hydrochlorothiazide (HYDRODIURIL) 25 MG tablet TAKE 1 TABLET BY MOUTH  DAILY   isosorbide mononitrate (IMDUR) 60 MG 24 hr tablet TAKE 1 AND 1/2 TABLETS BY  MOUTH DAILY   losartan (COZAAR) 25 MG tablet TAKE 1 TABLET BY MOUTH DAILY   Multiple Vitamins-Minerals  (ZINC PO) Take 2 tablets by mouth daily.   nitroGLYCERIN (NITROSTAT) 0.4 MG SL tablet DISSOLVE 1 TABLET UNDER THE TONGUE EVERY 5 MINUTES AS  NEEDED FOR CHEST PAIN. MAX  OF 3 TABLETS IN 15 MINUTES. CALL 911 IF PAIN PERSISTS.   No facility-administered encounter medications on file as of 04/27/2022.    Care Gaps: Covid-19 Vaccine Shingrix Vaccine Tetanus Vaccine  Star Rating Drugs: Losatatn 25 mg last filled on 02/19/2022 90 day supply at [facility].  Medication Fill Gaps: Atenolol 25 mg last filled on 12/11/2021 90 day supply. Clopidogrel 75 mg last filled on 01/06/2022 90 day supply. Ezetimibe 10 mg last filled on 01/05/2022 90 day supply. Isosorbide Mononitrate 60 mg last filled on 01/05/2022 90 day supply.   [place] Pharmacist Assistant [phone]

## 2022-04-30 ENCOUNTER — Ambulatory Visit (INDEPENDENT_AMBULATORY_CARE_PROVIDER_SITE_OTHER): Payer: Medicare Other

## 2022-04-30 DIAGNOSIS — I10 Essential (primary) hypertension: Secondary | ICD-10-CM

## 2022-04-30 DIAGNOSIS — I2510 Atherosclerotic heart disease of native coronary artery without angina pectoris: Secondary | ICD-10-CM

## 2022-04-30 DIAGNOSIS — I5032 Chronic diastolic (congestive) heart failure: Secondary | ICD-10-CM

## 2022-04-30 DIAGNOSIS — J4520 Mild intermittent asthma, uncomplicated: Secondary | ICD-10-CM

## 2022-04-30 DIAGNOSIS — M85851 Other specified disorders of bone density and structure, right thigh: Secondary | ICD-10-CM

## 2022-04-30 NOTE — Progress Notes (Incomplete)
Chronic Care Management Pharmacy Note  04/30/2022 Name:  [name] MRN:  [id number] DOB:  10-Apr-1928  Summary: *** -Neck stiffness  Recently retired from working as a Loss adjuster, chartered made from today's visit: ***  Plan: ***   Subjective: [name] is an 88 y.o. year old female who is a primary patient of Nche, Charlene Brooke, NP.  The CCM team was consulted for assistance with disease management and care coordination needs.    Engaged with patient by telephone for initial visit in response to provider referral for pharmacy case management and/or care coordination services.   Consent to Services:  The patient was given the following information about Chronic Care Management services today, agreed to services, and gave verbal consent: 1. CCM service includes personalized support from designated clinical staff supervised by the primary care provider, including individualized plan of care and coordination with other care providers 2. 24/7 contact phone numbers for assistance for urgent and routine care needs. 3. Service will only be billed when office clinical staff spend 20 minutes or more in a month to coordinate care. 4. Only one practitioner may furnish and bill the service in a calendar month. 5.The patient may stop CCM services at any time (effective at the end of the month) by phone call to the office staff. 6. The patient will be responsible for cost sharing (co-pay) of up to 20% of the service fee (after annual deductible is met). Patient agreed to services and consent obtained.  Patient Care Team: Nche, Charlene Brooke, NP as PCP - General (Internal Medicine) Nahser, Wonda Cheng, MD as PCP - Cardiology (Cardiology) Germaine Pomfret, [facility] [place] as Pharmacist (Pharmacist) Sharmon Revere as Physician Assistant (Cardiology) Pa, [facility] as Consulting Physician (Ophthalmology)  Recent office visits: 03/20/2022 Wilfred Lacy NP (PCP) Medicare  Wellness Completed, No medication Changes noted, return in 1 year   Recent consult visits: 04/13/2022 Dr. Gershon Crane MD (Ophthalmology) No Medication Changes noted 02/19/2022 Leanor Kail PA (Cardiology) Decrease Losartan to 25 mg daily  Hospital visits: None in previous 6 months   Objective:  Lab Results  Component Value Date   CREATININE 0.77 03/20/2022   BUN 18 03/20/2022   GFR 67.05 03/20/2022   EGFR 53 (L) 03/04/2021   GFRNONAA 59 (L) 06/07/2020   GFRAA >60 06/07/2020   NA 137 03/20/2022   K 4.7 03/20/2022   CALCIUM 9.6 03/20/2022   CO2 32 03/20/2022   GLUCOSE 89 03/20/2022    Lab Results  Component Value Date/Time   GFR 67.05 03/20/2022 11:34 AM   GFR 49.17 (L) 09/26/2021 07:57 AM    Last diabetic Eye exam: No results found for: "HMDIABEYEEXA"  Last diabetic Foot exam: No results found for: "HMDIABFOOTEX"   Lab Results  Component Value Date   CHOL 102 09/26/2021   HDL 35.60 (L) 09/26/2021   LDLCALC 46 09/26/2021   LDLDIRECT 155.6 05/28/2011   TRIG 104.0 09/26/2021   CHOLHDL 3 09/26/2021       Latest Ref Rng & Units 03/20/2022   11:34 AM 09/26/2021    7:57 AM 01/24/2021    2:50 PM  Hepatic Function  Total Protein 6.0 - 8.3 g/dL 6.9  6.6  7.1   Albumin 3.5 - 5.2 g/dL 3.9  3.9    AST 0 - 37 U/L 20  19  18    ALT 0 - 35 U/L 7  8  6    Alk Phosphatase 39 - 117 U/L 52  59  Total Bilirubin 0.2 - 1.2 mg/dL 0.5  0.5  0.6     Lab Results  Component Value Date/Time   TSH 2.99 09/26/2021 07:57 AM   TSH 2.62 01/03/2020 11:26 AM       Latest Ref Rng & Units 03/20/2022   11:34 AM 09/26/2021    7:57 AM 03/04/2021    9:58 AM  CBC  WBC 4.0 - 10.5 K/uL 4.9  5.1  6.3   Hemoglobin 12.0 - 15.0 g/dL 12.6  12.1  12.1   Hematocrit 36.0 - 46.0 % 37.2  35.8  36.0   Platelets 150.0 - 400.0 K/uL 172.0  187.0  195     Lab Results  Component Value Date/Time   VD25OH 66.44 03/20/2022 11:34 AM   VD25OH 28 (L) 08/19/2015 12:54 PM   VD25OH 40 01/13/2013 11:10 AM     Clinical ASCVD: No  The ASCVD Risk score (Arnett DK, et al., 2019) failed to calculate for the following reasons:   The 2019 ASCVD risk score is only valid for ages 45 to 63       03/20/2022   11:23 AM 09/19/2021    4:38 PM 10/07/2018   11:03 AM  Depression screen PHQ 2/9  Decreased Interest 3 0 0  Down, Depressed, Hopeless 0 0 0  PHQ - 2 Score 3 0 0  Altered sleeping 2 1   Tired, decreased energy 1 1   Change in appetite 0 0   Feeling bad or failure about yourself  0 0   Trouble concentrating 3 1   Moving slowly or fidgety/restless 0 0   Suicidal thoughts 0 0   PHQ-9 Score 9 3   Difficult doing work/chores Not difficult at all      Social History   Tobacco Use  Smoking Status Never  Smokeless Tobacco Never   BP Readings from Last 3 Encounters:  03/20/22 126/60  02/19/22 102/60  10/24/21 (!) 90/54   Pulse Readings from Last 3 Encounters:  03/20/22 60  02/19/22 77  10/24/21 68   Wt Readings from Last 3 Encounters:  03/20/22 124 lb 12.8 oz (56.6 kg)  02/19/22 128 lb (58.1 kg)  10/24/21 123 lb (55.8 kg)   BMI Readings from Last 3 Encounters:  03/20/22 27.01 kg/m  02/19/22 27.70 kg/m  10/24/21 26.62 kg/m    Assessment/Interventions: Review of patient past medical history, allergies, medications, health status, including review of consultants reports, laboratory and other test data, was performed as part of comprehensive evaluation and provision of chronic care management services.   SDOH:  (Social Determinants of Health) assessments and interventions performed: Yes  SDOH Screenings   Alcohol Screen: Not on file  Depression (PHQ2-9): Medium Risk (03/20/2022)   Depression (PHQ2-9)    PHQ-2 Score: 9  Financial Resource Strain: Not on file  Food Insecurity: Not on file  Housing: Not on file  Physical Activity: Not on file  Social Connections: Not on file  Stress: Not on file  Tobacco Use: Low Risk  (03/20/2022)   Patient History    Smoking Tobacco  Use: Never    Smokeless Tobacco Use: Never    Passive Exposure: Not on file  Transportation Needs: Not on file    CCM Care Plan  Allergies  Allergen Reactions   Codeine Cough   Crestor [Rosuvastatin Calcium] Cough   Lisinopril Cough   Pantoprazole Sodium Cough   Pravastatin Cough   Simvastatin Cough    Medications Reviewed Today     Reviewed by  Nche, Charlene Brooke, NP (Nurse Practitioner) on 03/20/22 at 1540  Med List Status: <None>   Medication Order Taking? Sig Documenting Provider Last Dose Status Informant  albuterol (VENTOLIN HFA) 108 (90 Base) MCG/ACT inhaler [id number] Yes Inhale 1-2 puffs into the lungs every 6 (six) hours as needed for wheezing or shortness of breath. [name], NP Taking Active Multiple Informants  aspirin EC 81 MG tablet [id number] Yes Take 81 mg by mouth daily. Nahser, Wonda Cheng, MD Taking Active Multiple Informants  atenolol (TENORMIN) 25 MG tablet [id number] Yes Take 2 tablets (50 mg total) by mouth daily. Nche, Charlene Brooke, NP Taking Active   calcium elemental as carbonate (TUMS ULTRA 1000) 400 MG chewable tablet [id number] Yes Chew 1,000 mg by mouth daily as needed for heartburn. [provider] Taking Active Multiple Informants  cholecalciferol (VITAMIN D3) 25 MCG (1000 UNIT) tablet [id number] Yes Take 1,000 Units by mouth daily. [provider] Taking Active Multiple Informants  clopidogrel (PLAVIX) 75 MG tablet [id number] Yes TAKE 1 TABLET BY MOUTH  DAILY WITH BREAKFAST Nche, Charlene Brooke, NP Taking Active   ezetimibe (ZETIA) 10 MG tablet [id number] Yes Take 1 tablet (10 mg total) by mouth daily. Nahser, Wonda Cheng, MD Taking Active   hydrochlorothiazide (HYDRODIURIL) 25 MG tablet [id number] Yes TAKE 1 TABLET BY MOUTH  DAILY Nahser, Wonda Cheng, MD Taking Active   isosorbide mononitrate (IMDUR) 60 MG 24 hr tablet [id number] Yes TAKE 1 AND 1/2 TABLETS BY  MOUTH DAILY Nahser, Wonda Cheng, MD Taking Active   losartan (COZAAR) 25 MG tablet  [id number] Yes Take 1 tablet (25 mg total) by mouth daily. Leanor Kail, PA Taking Active   Multiple Vitamins-Minerals (ZINC PO) [id number] Yes Take 2 tablets by mouth daily. [provider] Taking Active Multiple Informants  nitroGLYCERIN (NITROSTAT) 0.4 MG SL tablet [id number] Yes DISSOLVE 1 TABLET UNDER THE TONGUE EVERY 5 MINUTES AS  NEEDED FOR CHEST PAIN. MAX  OF 3 TABLETS IN 15 MINUTES. CALL 911 IF PAIN PERSISTS. Nahser, Wonda Cheng, MD Taking Active             Patient Active Problem List   Diagnosis Date Noted   Statin myopathy 05/30/2021   Chronic heart failure with preserved ejection fraction ([place]) 06/07/2020   Angina pectoris ([place]) 06/06/2020   Progressive angina ([place]) 05/24/2020   Unstable angina ([place]) 05/24/2020   Anemia 06/09/2013   Asthma 06/09/2013   Dyspnea and respiratory abnormality 06/09/2013   Psoriasis 01/31/2013   Essential hypertension 07/08/2012   Vitamin D deficiency 12/22/2011   Concussion 11/25/2011   Multiple fractures of ribs of left side 11/25/2011   Grade 2 splenic laceration 11/25/2011   GERD (gastroesophageal reflux disease) 11/25/2011   Atherosclerotic heart disease of native coronary artery without angina pectoris 05/28/2011   Hyperlipidemia 12/05/2008   ASTHMA, UNSPECIFIED 12/05/2008   Diverticulosis of colon 12/05/2008   HEMOCCULT POSITIVE STOOL 12/05/2008   Arthropathy 12/18/2006   Osteopenia 12/18/2006    Immunization History  Administered Date(s) Administered   Fluad Quad(high Dose 65+) 09/09/2019, 09/19/2021   Influenza Split 08/24/2011, 08/23/2013, 08/10/2014, 07/25/2015, 08/19/2015, 09/11/2016   Influenza Whole 09/21/2007   Influenza, High Dose Seasonal PF 10/04/2017, 10/01/2018   Influenza,inj,Quad PF,6+ Mos 08/10/2014, 08/19/2015, 09/11/2016   Influenza-Unspecified 07/25/2015   Pneumococcal Conjugate-13 08/08/2012   Pneumococcal Polysaccharide-23 01/25/2015   Tdap 11/25/2011    Conditions to be  addressed/monitored:  Hypertension, Hyperlipidemia, Heart Failure, Coronary Artery Disease, GERD, Asthma, and Osteopenia  There are no care plans that you recently  modified to display for this patient.    Medication Assistance: {MEDASSISTANCEINFO:25044}  Compliance/Adherence/Medication fill history: Care Gaps: ***  Star-Rating Drugs: ***  Patient's preferred pharmacy is:  [place], [place] [address] Phone: [phone] Fax: [phone]  [facility] - [place], [place] [facility] [address] [place] [address] [place] [address] Phone: [phone] Fax: [phone]  [id number] [address] [address]Phone: [phone]: [phone] [facility] - [address] [address]Phone: [phone]: [phone] Uses pill box? {Yes or If no, why not?:20788} Pt endorses ***% compliance - Forgets her medications 1-2 x   We discussed: {Pharmacy options:24294} Patient decided to: {US Pharmacy Plan:23885}  Care Plan and Follow Up Patient Decision:  {FOLLOWUP:24991}  Plan: {CM FOLLOW UP PVHSJ:29090} ***  Current Barriers:  {pharmacybarriers:24917}  Pharmacist Clinical Goal(s):  Patient will {PHARMACYGOALCHOICES:24921} through collaboration with PharmD and provider.   Interventions: 1:1 collaboration with Nche, CCharlene Brooke NP regarding development and update of comprehensive plan of care as evidenced by provider attestation and co-signature Inter-disciplinary care team collaboration (see longitudinal plan of care) Comprehensive medication review performed; medication list updated in electronic medical record  Heart Failure (Goal: manage symptoms and prevent exacerbations) -{US  controlled/uncontrolled:25276} -Last ejection fraction: *** (Date: ***) -HF type: Diastolic -NYHA Class: {CHL HP Upstream Pharm NYHA Class:[phone]} -AHA HF Stage: {CHL HP Upstream Pharm AHA HF Stage:[phone]} -Current treatment: Atenolol 25 mg 2 tablets daily  HCTZ 25 mg daily  Imdur 60 mg 1.5 tablets daily  Losartan 25 mg daily  -Medications previously tried: ***  -Current home BP/HR readings: Does not monitor routinely; does own a machine  -Current dietary habits: *** -Current exercise habits: *** -Educated on {CCM HF Counseling:25125} -{CCMPHARMDINTERVENTION:25122}  Hyperlipidemia: (LDL goal < ***) -{US controlled/uncontrolled:25276} -CAD s/p stent RCA 2001; DES RCA 2015, res-stenting July 2022  -Current treatment: Ezetimibe 10 mg daily  -Current treatment: Aspirin 81 mg daily  Clopidogrel 75 mg daily  -Medications previously tried: ***  -Current dietary patterns: *** -Current exercise habits: *** -Educated on {CCM HLD Counseling:25126} -{CCMPHARMDINTERVENTION:25122}  Osteoporosis / Osteopenia (Goal ***) -{US controlled/uncontrolled:25276} -Last DEXA Scan: May 2019   T-Score femoral neck: -0.8  T-Score total hip: -1.3  T-Score lumbar spine: +0.1  10-year probability of major osteoporotic fracture: 9.6%  10-year probability of hip fracture: 2.4% -Patient is not a candidate for pharmacologic treatment -Current treatment  Calcium carbonate  Vitamin D3  -Medications previously tried: ***  -{Osteoporosis Counseling:23892} -{CCMPHARMDINTERVENTION:25122}  Asthma (Goal: control symptoms and prevent exacerbations) -{US controlled/uncontrolled:25276} -Current treatment  *** -Medications previously tried: ***  -Gold Grade: {CHL HP Upstream Pharm COPD Gold GBOBOF:[id number]}-Current COPD Classification:  {CHL HP Upstream Pharm COPD Classification:[phone]} -MMRC/CAT score: *** -Pulmonary function testing: *** -Exacerbations requiring treatment in last 6 months:  *** -Patient {Actions; denies-reports:120008} consistent use of maintenance inhaler -Frequency of rescue inhaler use: <1  -Counseled on {CCMINHALERCOUNSELING:25121} -{CCMPHARMDINTERVENTION:25122}   Patient Goals/Self-Care Activities Patient will:  - {pharmacypatientgoals:24919}  Follow Up Plan: {CM FOLLOW UP PHRVA:44584}

## 2022-05-22 DIAGNOSIS — E785 Hyperlipidemia, unspecified: Secondary | ICD-10-CM

## 2022-05-22 DIAGNOSIS — I5030 Unspecified diastolic (congestive) heart failure: Secondary | ICD-10-CM

## 2022-05-22 DIAGNOSIS — I2510 Atherosclerotic heart disease of native coronary artery without angina pectoris: Secondary | ICD-10-CM | POA: Diagnosis not present

## 2022-05-22 DIAGNOSIS — J45909 Unspecified asthma, uncomplicated: Secondary | ICD-10-CM

## 2022-06-02 ENCOUNTER — Encounter (HOSPITAL_COMMUNITY): Payer: Self-pay

## 2022-06-02 ENCOUNTER — Inpatient Hospital Stay (HOSPITAL_COMMUNITY)
Admission: EM | Admit: 2022-06-02 | Discharge: 2022-06-06 | DRG: 312 | Disposition: A | Discharge status: Home Health | Payer: Medicare Other | Attending: Internal Medicine | Admitting: Internal Medicine

## 2022-06-02 ENCOUNTER — Emergency Department (HOSPITAL_COMMUNITY): Payer: Medicare Other

## 2022-06-02 ENCOUNTER — Other Ambulatory Visit: Payer: Self-pay

## 2022-06-02 ENCOUNTER — Observation Stay (HOSPITAL_COMMUNITY): Payer: Medicare Other

## 2022-06-02 DIAGNOSIS — R55 Syncope and collapse: Secondary | ICD-10-CM | POA: Diagnosis not present

## 2022-06-02 DIAGNOSIS — I10 Essential (primary) hypertension: Secondary | ICD-10-CM | POA: Diagnosis not present

## 2022-06-02 DIAGNOSIS — E785 Hyperlipidemia, unspecified: Secondary | ICD-10-CM | POA: Diagnosis present

## 2022-06-02 DIAGNOSIS — Z955 Presence of coronary angioplasty implant and graft: Secondary | ICD-10-CM

## 2022-06-02 DIAGNOSIS — I2510 Atherosclerotic heart disease of native coronary artery without angina pectoris: Secondary | ICD-10-CM | POA: Diagnosis present

## 2022-06-02 DIAGNOSIS — Z7982 Long term (current) use of aspirin: Secondary | ICD-10-CM

## 2022-06-02 DIAGNOSIS — M1990 Unspecified osteoarthritis, unspecified site: Secondary | ICD-10-CM | POA: Diagnosis present

## 2022-06-02 DIAGNOSIS — I25119 Atherosclerotic heart disease of native coronary artery with unspecified angina pectoris: Secondary | ICD-10-CM | POA: Diagnosis present

## 2022-06-02 DIAGNOSIS — K219 Gastro-esophageal reflux disease without esophagitis: Secondary | ICD-10-CM | POA: Diagnosis not present

## 2022-06-02 DIAGNOSIS — I951 Orthostatic hypotension: Secondary | ICD-10-CM | POA: Diagnosis not present

## 2022-06-02 DIAGNOSIS — Z885 Allergy status to narcotic agent status: Secondary | ICD-10-CM

## 2022-06-02 DIAGNOSIS — E871 Hypo-osmolality and hyponatremia: Secondary | ICD-10-CM | POA: Diagnosis present

## 2022-06-02 DIAGNOSIS — Z8249 Family history of ischemic heart disease and other diseases of the circulatory system: Secondary | ICD-10-CM

## 2022-06-02 DIAGNOSIS — I110 Hypertensive heart disease with heart failure: Secondary | ICD-10-CM | POA: Diagnosis present

## 2022-06-02 DIAGNOSIS — E559 Vitamin D deficiency, unspecified: Secondary | ICD-10-CM | POA: Diagnosis present

## 2022-06-02 DIAGNOSIS — E782 Mixed hyperlipidemia: Secondary | ICD-10-CM

## 2022-06-02 DIAGNOSIS — Z888 Allergy status to other drugs, medicaments and biological substances status: Secondary | ICD-10-CM

## 2022-06-02 DIAGNOSIS — Z7902 Long term (current) use of antithrombotics/antiplatelets: Secondary | ICD-10-CM

## 2022-06-02 DIAGNOSIS — Z79899 Other long term (current) drug therapy: Secondary | ICD-10-CM

## 2022-06-02 DIAGNOSIS — E876 Hypokalemia: Secondary | ICD-10-CM | POA: Diagnosis present

## 2022-06-02 DIAGNOSIS — I5032 Chronic diastolic (congestive) heart failure: Secondary | ICD-10-CM | POA: Diagnosis present

## 2022-06-02 LAB — BASIC METABOLIC PANEL
Anion gap: 8 (ref 5–15)
BUN: 13 mg/dL (ref 8–23)
CO2: 29 mmol/L (ref 22–32)
Calcium: 8.8 mg/dL — ABNORMAL LOW (ref 8.9–10.3)
Chloride: 97 mmol/L — ABNORMAL LOW (ref 98–111)
Creatinine, Ser: 0.79 mg/dL (ref 0.44–1.00)
GFR, Estimated: >60 mL/min (ref >60)
Glucose, Bld: 113 mg/dL — ABNORMAL HIGH (ref 70–99)
Potassium: 3.7 mmol/L (ref 3.5–5.1)
Sodium: 134 mmol/L — ABNORMAL LOW (ref 135–145)

## 2022-06-02 LAB — CBC
HCT: 35.7 % — ABNORMAL LOW (ref 36.0–46.0)
Hemoglobin: 11.9 g/dL — ABNORMAL LOW (ref 12.0–15.0)
MCH: 31.4 pg (ref 26.0–34.0)
MCHC: 33.3 g/dL (ref 30.0–36.0)
MCV: 94.2 fL (ref 80.0–100.0)
Platelets: 192 10*3/uL (ref 150–400)
RBC: 3.79 MIL/uL — ABNORMAL LOW (ref 3.87–5.11)
RDW: 13.2 % (ref 11.5–15.5)
WBC: 6.5 10*3/uL (ref 4.0–10.5)
nRBC: 0 % (ref 0.0–0.2)

## 2022-06-02 LAB — CBG MONITORING, ED: Glucose-Capillary: 117 mg/dL — ABNORMAL HIGH (ref 70–99)

## 2022-06-02 LAB — MAGNESIUM: Magnesium: 1.7 mg/dL (ref 1.7–2.4)

## 2022-06-02 LAB — TROPONIN I (HIGH SENSITIVITY)
Troponin I (High Sensitivity): 12 ng/L (ref <18)
Troponin I (High Sensitivity): 10 ng/L (ref <18)

## 2022-06-02 LAB — D-DIMER, QUANTITATIVE: D-Dimer, Quant: 1.11 ug/mL-FEU — ABNORMAL HIGH (ref 0.00–0.50)

## 2022-06-02 MED ORDER — VITAMIN D 25 MCG (1000 UNIT) PO TABS
1000.0000 [IU] | ORAL_TABLET | Freq: Every day | ORAL | Status: DC
  Administered 2022-06-03 – 2022-06-06 (×4): 1000 [IU] via ORAL
  Filled 2022-06-02 (×4): qty 1

## 2022-06-02 MED ORDER — NITROGLYCERIN 0.4 MG SL SUBL: 0.4000 mg | SUBLINGUAL_TABLET | SUBLINGUAL | Status: DC | PRN

## 2022-06-02 MED ORDER — ACETAMINOPHEN 650 MG RE SUPP: 650.0000 mg | Freq: Four times a day (QID) | RECTAL | Status: DC | PRN

## 2022-06-02 MED ORDER — FLEET ENEMA 7-19 GM/118ML RE ENEM: 1.0000 | ENEMA | Freq: Once | RECTAL | Status: DC | PRN

## 2022-06-02 MED ORDER — CALCIUM CARBONATE ANTACID 500 MG PO CHEW: 1000.0000 mg | CHEWABLE_TABLET | Freq: Every day | ORAL | Status: DC | PRN

## 2022-06-02 MED ORDER — ASPIRIN 81 MG PO TBEC
81.0000 mg | DELAYED_RELEASE_TABLET | Freq: Every day | ORAL | Status: DC
  Administered 2022-06-03 – 2022-06-06 (×4): 81 mg via ORAL
  Filled 2022-06-02 (×4): qty 1

## 2022-06-02 MED ORDER — ALUM & MAG HYDROXIDE-SIMETH 200-200-20 MG/5ML PO SUSP: 30.0000 mL | Freq: Four times a day (QID) | ORAL | Status: DC | PRN

## 2022-06-02 MED ORDER — SORBITOL 70 % SOLN: 30.0000 mL | Freq: Every day | Status: DC | PRN

## 2022-06-02 MED ORDER — ALBUTEROL SULFATE (2.5 MG/3ML) 0.083% IN NEBU: 2.5000 mg | INHALATION_SOLUTION | Freq: Four times a day (QID) | RESPIRATORY_TRACT | Status: DC | PRN

## 2022-06-02 MED ORDER — ONDANSETRON HCL 4 MG PO TABS: 4.0000 mg | ORAL_TABLET | Freq: Four times a day (QID) | ORAL | Status: DC | PRN

## 2022-06-02 MED ORDER — EZETIMIBE 10 MG PO TABS
10.0000 mg | ORAL_TABLET | Freq: Every day | ORAL | Status: DC
  Administered 2022-06-03 – 2022-06-06 (×4): 10 mg via ORAL
  Filled 2022-06-02 (×4): qty 1

## 2022-06-02 MED ORDER — SODIUM CHLORIDE 0.9 % IV BOLUS
500.0000 mL | Freq: Once | INTRAVENOUS | Status: AC
  Administered 2022-06-02: 500 mL via INTRAVENOUS

## 2022-06-02 MED ORDER — SODIUM CHLORIDE 0.9% FLUSH
3.0000 mL | Freq: Two times a day (BID) | INTRAVENOUS | Status: DC
  Administered 2022-06-03 – 2022-06-06 (×5): 3 mL via INTRAVENOUS

## 2022-06-02 MED ORDER — SODIUM CHLORIDE 0.9 % IV SOLN
INTRAVENOUS | Status: AC
Start: 2022-06-02 — End: 2022-06-03

## 2022-06-02 MED ORDER — ENOXAPARIN SODIUM 40 MG/0.4ML IJ SOSY
40.0000 mg | PREFILLED_SYRINGE | INTRAMUSCULAR | Status: DC
Start: 2022-06-02 — End: 2022-06-04
  Administered 2022-06-02 – 2022-06-03 (×2): 40 mg via SUBCUTANEOUS
  Filled 2022-06-02 (×2): qty 0.4

## 2022-06-02 MED ORDER — FAMOTIDINE 40 MG/5ML PO SUSR
20.0000 mg | Freq: Every day | ORAL | Status: DC
  Administered 2022-06-03 – 2022-06-06 (×4): 20 mg via ORAL
  Filled 2022-06-02 (×4): qty 2.5

## 2022-06-02 MED ORDER — CLOPIDOGREL BISULFATE 75 MG PO TABS
75.0000 mg | ORAL_TABLET | Freq: Every day | ORAL | Status: DC
  Administered 2022-06-03 – 2022-06-06 (×4): 75 mg via ORAL
  Filled 2022-06-02 (×4): qty 1

## 2022-06-02 MED ORDER — ONDANSETRON HCL 4 MG/2ML IJ SOLN: 4.0000 mg | Freq: Four times a day (QID) | INTRAMUSCULAR | Status: DC | PRN

## 2022-06-02 MED ORDER — ACETAMINOPHEN 325 MG PO TABS: 650.0000 mg | ORAL_TABLET | Freq: Four times a day (QID) | ORAL | Status: DC | PRN

## 2022-06-02 MED ORDER — POLYETHYLENE GLYCOL 3350 17 G PO PACK: 17.0000 g | PACK | Freq: Every day | ORAL | Status: DC | PRN

## 2022-06-02 NOTE — ED Triage Notes (Signed)
Pt BIB EMS after having a witnessed syncopal episode in her daughters car. Patient became altered and then unresponsive , when EMS was called. Upon EMS arrival patient was still altered but had regained consciousness. Patient became more responsive on route with EMS and is now axox4.

## 2022-06-02 NOTE — ED Notes (Signed)
Called lab regarding adding on D-dimer to patient's previously collected labs per lab they will add on.

## 2022-06-02 NOTE — ED Provider Notes (Signed)
[facility] EMERGENCY DEPARTMENT Provider Note   CSN: [id number] Arrival date & time: 06/02/22  1243     History  Chief Complaint  Patient presents with   Loss of Consciousness    [name] is a 88 y.o. female who presents the emergency department after a witnessed syncopal episode at around 11:30 AM.  Patient states that she woke up this morning, had a bit of a donut for breakfast and a sip of water.  She took her blood pressure medication, and then her daughter picked her up.  While in the car patient states that she was feeling slightly dizzy, and when daughter tried to carry on a conversation she realized patient was not responding.  She tried to get her to wake up, and after about 5 minutes of being able to do so she called 911.  Patient regained consciousness once EMS got there, and became more responsive while in route.  Patient is currently on several blood pressure medications, and recently changed from taking them in the morning to taking them at night. She forgot to take them last night, and took them this morning instead. Patient denied any chest pain/discomfort, headache, or difficulty breathing.   Loss of Consciousness Associated symptoms: dizziness   Associated symptoms: no chest pain, no headaches, no nausea, no seizures, no shortness of breath and no vomiting        Home Medications Prior to Admission medications   Medication Sig Start Date End Date Taking? Authorizing Provider  albuterol (VENTOLIN HFA) 108 (90 Base) MCG/ACT inhaler Inhale 1-2 puffs into the lungs every 6 (six) hours as needed for wheezing or shortness of breath. 01/24/21   Nche, Bonna Gains, NP  aspirin EC 81 MG tablet Take 81 mg by mouth daily. 04/10/14   Nahser, Deloris Ping, MD  atenolol (TENORMIN) 25 MG tablet Take 2 tablets (50 mg total) by mouth daily. 09/21/21   Nche, Bonna Gains, NP  calcium elemental as carbonate (TUMS ULTRA 1000) 400 MG chewable tablet Chew 1,000 mg by  mouth daily as needed for heartburn.    [provider]  cholecalciferol (VITAMIN D3) 25 MCG (1000 UNIT) tablet Take 1,000 Units by mouth daily.    [provider]  clopidogrel (PLAVIX) 75 MG tablet TAKE 1 TABLET BY MOUTH  DAILY WITH BREAKFAST Patient taking differently: Take 75 mg by mouth daily. 01/06/22   Nche, Bonna Gains, NP  Evolocumab (REPATHA SURECLICK) 140 MG/ML SOAJ Inject 140 mg into the skin every 14 (fourteen) days.    [provider]  ezetimibe (ZETIA) 10 MG tablet Take 1 tablet (10 mg total) by mouth daily. Patient not taking: Reported on 04/30/2022 08/04/21   Nahser, Deloris Ping, MD  hydrochlorothiazide (HYDRODIURIL) 25 MG tablet TAKE 1 TABLET BY MOUTH  DAILY Patient taking differently: Take 25 mg by mouth daily. 03/18/22   Nahser, Deloris Ping, MD  isosorbide mononitrate (IMDUR) 60 MG 24 hr tablet TAKE 1 AND 1/2 TABLETS BY  MOUTH DAILY Patient taking differently: Take 30 mg by mouth daily. 03/02/22   Nahser, Deloris Ping, MD  losartan (COZAAR) 25 MG tablet TAKE 1 TABLET BY MOUTH DAILY Patient taking differently: Take 25 mg by mouth daily. 04/16/22   Nahser, Deloris Ping, MD  Multiple Vitamins-Minerals (PRESERVISION AREDS 2+MULTI VIT PO) Take 1 capsule by mouth in the morning and at bedtime.    [provider]  nitroGLYCERIN (NITROSTAT) 0.4 MG SL tablet DISSOLVE 1 TABLET UNDER THE TONGUE EVERY 5 MINUTES AS  NEEDED FOR CHEST PAIN. MAX  OF 3 TABLETS IN 15 MINUTES. CALL 911 IF PAIN PERSISTS. Patient taking differently: Place 0.4 mg under the tongue every 5 (five) minutes as needed for chest pain. 10/06/21   Nahser, Deloris Ping, MD      Allergies    Codeine, Crestor [rosuvastatin calcium], Lisinopril, Pantoprazole sodium, Pravastatin, and Simvastatin    Review of Systems   Review of Systems  Respiratory:  Negative for shortness of breath.   Cardiovascular:  Positive for syncope. Negative for chest pain.  Gastrointestinal:  Negative for abdominal pain, nausea and  vomiting.  Neurological:  Positive for dizziness and syncope. Negative for seizures, facial asymmetry, speech difficulty, numbness and headaches.  All other systems reviewed and are negative.   Physical Exam Updated Vital Signs BP 134/67   Pulse 83   Temp 98 F (36.7 C) (Oral)   Resp (!) 24   Ht 4\' 9"  (1.448 m)   Wt 56.6 kg   SpO2 100%   BMI 27.00 kg/m  Physical Exam Vitals and nursing note reviewed.  Constitutional:      Appearance: Normal appearance.  HENT:     Head: Normocephalic and atraumatic.  Eyes:     Conjunctiva/sclera: Conjunctivae normal.  Cardiovascular:     Rate and Rhythm: Normal rate and regular rhythm.  Pulmonary:     Effort: Pulmonary effort is normal. No respiratory distress.     Breath sounds: Normal breath sounds.  Abdominal:     General: There is no distension.     Palpations: Abdomen is soft.     Tenderness: There is no abdominal tenderness.  Skin:    General: Skin is warm and dry.  Neurological:     General: No focal deficit present.     Mental Status: She is alert.     Comments: Neuro: Speech is clear, able to follow commands. CN III-XII intact grossly intact. PERRLA. EOMI. Sensation intact throughout. Str 5/5 all extremities.     ED Results / Procedures / Treatments   Labs (all labs ordered are listed, but only abnormal results are displayed) Labs Reviewed  CBC - Abnormal; Notable for the following components:      Result Value   RBC 3.79 (*)    Hemoglobin 11.9 (*)    HCT 35.7 (*)    All other components within normal limits  BASIC METABOLIC PANEL - Abnormal; Notable for the following components:   Sodium 134 (*)    Chloride 97 (*)    Glucose, Bld 113 (*)    Calcium 8.8 (*)    All other components within normal limits  CBG MONITORING, ED - Abnormal; Notable for the following components:   Glucose-Capillary 117 (*)    All other components within normal limits  URINE CULTURE  URINALYSIS, ROUTINE W REFLEX MICROSCOPIC  TROPONIN I  (HIGH SENSITIVITY)  TROPONIN I (HIGH SENSITIVITY)    EKG None  Radiology CT Head Wo Contrast  Result Date: 06/02/2022 CLINICAL DATA:  Syncope EXAM: CT HEAD WITHOUT CONTRAST TECHNIQUE: Contiguous axial images were obtained from the base of the skull through the vertex without intravenous contrast. RADIATION DOSE REDUCTION: This exam was performed according to the departmental dose-optimization program which includes automated exposure control, adjustment of the mA and/or kV according to patient size and/or use of iterative reconstruction technique. COMPARISON:  Head CT dated Apr 03, 2020 FINDINGS: Brain: Chronic white matter ischemic change and generalized volume loss. No evidence of acute infarction, hemorrhage, hydrocephalus, extra-axial collection or mass lesion/mass  effect. Vascular: No hyperdense vessel or unexpected calcification. Skull: Normal. Negative for fracture or focal lesion. Sinuses/Orbits: No acute finding. Other: None. IMPRESSION: No acute intracranial abnormality. Electronically Signed   By: Allegra Lai M.D.   On: 06/02/2022 15:17    Procedures Procedures    Medications Ordered in ED Medications  0.9 %  sodium chloride infusion (has no administration in time range)  sodium chloride 0.9 % bolus 500 mL (500 mLs Intravenous New Bag/Given 06/02/22 1521)    ED Course/ Medical Decision Making/ A&P                           Medical Decision Making Amount and/or Complexity of Data Reviewed Labs: ordered.  This patient is a 88 y.o. female  who presents to the ED for concern of syncopal episode lasting around 5-10 minutes prior to ER arrival. No trauma.   Differential diagnoses prior to evaluation: The emergent differential diagnosis includes, but is not limited to,  CVA, ACS, arrhythmia, vasovagal syncope, orthostatic hypotension, sepsis, hypoglycemia, electrolyte disturbance, respiratory failure, symptomatic anemia, dehydration, heat injury, polypharmacy, malignancy,  anxiety/panic attack.  This is not an exhaustive differential.   Past Medical History / Co-morbidities: HTN, CAD, asthma, GERD, HFpEF  Physical Exam: Physical exam performed. The pertinent findings include: Patient with borderline soft systolic blood pressures.  Heart regular rate and rhythm, no respiratory distress.  Normal neurologic exam as above.  Lab Tests/Imaging studies: I personally interpreted labs/imaging and the pertinent results include: No leukocytosis, hemoglobin stable compared to prior.  Normal kidney function.  Glucose 117.  Initial troponin of 12. CT head showed no acute intracranial abnormality. I agree with the radiologist interpretation. Urinalysis pending.    Medications: I ordered medication including IV fluids for possible orthostatic hypotension.  I have reviewed the patients home medicines and have made adjustments as needed.  Consultations obtained: I consulted with hospitalist Dr Janee Morn who will admit   Disposition: After consideration of the diagnostic results and the patients response to treatment, I feel that patient is requiring admission for syncopal workup.   I discussed this case with my attending physician Dr. Particia Nearing who cosigned this note including patient's presenting symptoms, physical exam, and planned diagnostics and interventions. Attending physician stated agreement with plan or made changes to plan which were implemented.    Final Clinical Impression(s) / ED Diagnoses Final diagnoses:  Syncope, unspecified syncope type    Rx / DC Orders ED Discharge Orders     None      Portions of this report may have been transcribed using voice recognition software. Every effort was made to ensure accuracy; however, inadvertent computerized transcription errors may be present.    [name] 06/02/22 1720    Jacalyn Lefevre, MD 06/04/22 2027
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
## 2022-06-02 NOTE — ED Notes (Addendum)
Called lab regarding adding on troponin to previous collected las. Per lab, they will add on.

## 2022-06-02 NOTE — ED Notes (Signed)
Patient transported to CT 

## 2022-06-03 ENCOUNTER — Observation Stay (HOSPITAL_COMMUNITY): Payer: Medicare Other

## 2022-06-03 ENCOUNTER — Observation Stay (HOSPITAL_BASED_OUTPATIENT_CLINIC_OR_DEPARTMENT_OTHER): Payer: Medicare Other

## 2022-06-03 DIAGNOSIS — R55 Syncope and collapse: Secondary | ICD-10-CM

## 2022-06-03 LAB — COMPREHENSIVE METABOLIC PANEL
ALT: 9 U/L (ref 0–44)
AST: 17 U/L (ref 15–41)
Albumin: 2.8 g/dL — ABNORMAL LOW (ref 3.5–5.0)
Alkaline Phosphatase: 45 U/L (ref 38–126)
Anion gap: 5 (ref 5–15)
BUN: 14 mg/dL (ref 8–23)
CO2: 26 mmol/L (ref 22–32)
Calcium: 8.4 mg/dL — ABNORMAL LOW (ref 8.9–10.3)
Chloride: 101 mmol/L (ref 98–111)
Creatinine, Ser: 0.82 mg/dL (ref 0.44–1.00)
GFR, Estimated: >60 mL/min (ref >60)
Glucose, Bld: 97 mg/dL (ref 70–99)
Potassium: 3.3 mmol/L — ABNORMAL LOW (ref 3.5–5.1)
Sodium: 132 mmol/L — ABNORMAL LOW (ref 135–145)
Total Bilirubin: 0.7 mg/dL (ref 0.3–1.2)
Total Protein: 5.8 g/dL — ABNORMAL LOW (ref 6.5–8.1)

## 2022-06-03 LAB — ECHOCARDIOGRAM COMPLETE
AR max vel: 1.81 cm2
AV Peak grad: 6.7 mmHg
Ao pk vel: 1.29 m/s
Area-P 1/2: 4.39 cm2
Calc EF: 57.2 %
Height: 57 in
P 1/2 time: 626 msec
S' Lateral: 3.1 cm
Single Plane A2C EF: 58 %
Single Plane A4C EF: 57.5 %
Weight: 1990.4 oz

## 2022-06-03 LAB — CBC
HCT: 32.3 % — ABNORMAL LOW (ref 36.0–46.0)
Hemoglobin: 10.6 g/dL — ABNORMAL LOW (ref 12.0–15.0)
MCH: 31.3 pg (ref 26.0–34.0)
MCHC: 32.8 g/dL (ref 30.0–36.0)
MCV: 95.3 fL (ref 80.0–100.0)
Platelets: 163 10*3/uL (ref 150–400)
RBC: 3.39 MIL/uL — ABNORMAL LOW (ref 3.87–5.11)
RDW: 13.2 % (ref 11.5–15.5)
WBC: 5.8 10*3/uL (ref 4.0–10.5)
nRBC: 0 % (ref 0.0–0.2)

## 2022-06-03 MED ORDER — POTASSIUM CHLORIDE CRYS ER 20 MEQ PO TBCR
40.0000 meq | EXTENDED_RELEASE_TABLET | Freq: Once | ORAL | Status: AC
Start: 2022-06-03 — End: 2022-06-03
  Administered 2022-06-03: 40 meq via ORAL
  Filled 2022-06-03: qty 2

## 2022-06-03 MED ORDER — ISOSORBIDE MONONITRATE ER 60 MG PO TB24
90.0000 mg | ORAL_TABLET | Freq: Every day | ORAL | Status: DC
  Administered 2022-06-03 – 2022-06-04 (×2): 90 mg via ORAL
  Filled 2022-06-03 (×2): qty 1

## 2022-06-03 MED ORDER — IOHEXOL 350 MG/ML SOLN
100.0000 mL | Freq: Once | INTRAVENOUS | Status: AC | PRN
  Administered 2022-06-03: 70 mL via INTRAVENOUS

## 2022-06-03 MED ORDER — ATENOLOL 25 MG PO TABS
50.0000 mg | ORAL_TABLET | Freq: Every day | ORAL | Status: DC
  Administered 2022-06-03 – 2022-06-04 (×2): 50 mg via ORAL
  Filled 2022-06-03 (×2): qty 2

## 2022-06-03 MED ORDER — HYDROCHLOROTHIAZIDE 25 MG PO TABS
25.0000 mg | ORAL_TABLET | Freq: Every day | ORAL | Status: DC
  Administered 2022-06-03: 25 mg via ORAL
  Filled 2022-06-03: qty 1

## 2022-06-03 MED ORDER — LOSARTAN POTASSIUM 25 MG PO TABS
25.0000 mg | ORAL_TABLET | Freq: Every day | ORAL | Status: DC
Start: 2022-06-03 — End: 2022-06-06
  Administered 2022-06-03 – 2022-06-06 (×4): 25 mg via ORAL
  Filled 2022-06-03 (×4): qty 1

## 2022-06-03 NOTE — ED Notes (Signed)
Ambulated to and forth from the toilet with minimum assistance to the bathroom. And now currently sitting up at bedside eating breakfast.

## 2022-06-03 NOTE — Progress Notes (Addendum)
PROGRESS NOTE    [name]  XBM:[id number] DOB: [birth year] DOA: 06/02/2022 PCP: [name], NP   Brief Narrative:  HPI: [name] is a 88 y.o. female with medical history significant of hypertension, CAD status post multiple stents, GERD, hypertension, vitamin D deficiency presented to the ED after syncopal episode.  Patient and daughter gave the history and stated that this morning patient took her blood pressure medications was in the car with her daughter and daughter stopped at the bank.  Daughter went into the bank and when she came back out it was noted that patient's eyes were closed, mouth open some occasional yawning with unresponsiveness.  After 5 minutes daughter called 911 on route to the hospital stopped on the side of the street and EMS arrived.  Patient does endorse some dizziness prior to syncopal episode.  After the patient was placed in the EMS truck responsiveness improved and patient back to baseline.  Patient denies any fever, no chills, no chest pain, no shortness of breath, no nausea, no vomiting, no abdominal pain, no diarrhea, no constipation, no hematemesis, no hematochezia, no melena.  Patient denies any tongue biting or urinary incontinence.  Patient with no recent surgeries, no recent long car rides.  Patient with no prior history of DVTs.   ED Course: Patient seen in the ED noted to have soft blood pressure with systolics in the 90s to low 100s.  Basic metabolic profile obtained with a sodium of 134, chloride of 97, glucose of 113, calcium of 8.8 otherwise was within normal limits.  CBC obtained with a hemoglobin of 11.9 otherwise within normal limits.  Blood glucose noted at 113.  High-sensitivity troponin negative.  CT head done with no acute intracranial abnormalities.  Orthostatics checked in the ED noted to be positive.  Hospitalist were called to admit the patient for further evaluation and management.  Assessment & Plan:   Principal  Problem:   Syncope Active Problems:   Orthostatic hypotension   Hyperlipidemia   GERD (gastroesophageal reflux disease)   Vitamin D deficiency   Essential hypertension   CAD (coronary artery disease)  syncopal episode/orthostatic hypotension: Syncopal episode likely due to orthostatic hypotension which was evidenced at the time of admission.  CT head negative.  Doppler carotid unremarkable.  Echo shows normal ejection fraction with grade 1 diastolic dysfunction but no significant valvular defect.  Patient is still orthostatic positive but blood pressure much elevated as opposed to hypotension yesterday so I will resume all home medications.  D-dimer was elevated, CTA was obtained which ruled out PE.  Continue to monitor orthostatics every shift.  GERD -Pepcid.  Hypertension: Elevated.  Resuming home medications now that she is hypertensive.  CAD: Asymptomatic. -Continue home regimen aspirin, Plavix.  Hyperlipidemia -Continue Zetia.  7Vitamin D deficiency -Resume oral vitamin D supplementation.  Hypokalemia: We will replace.  DVT prophylaxis: enoxaparin (LOVENOX) injection 40 mg Start: 06/02/22 2000 TED hose Start: 06/02/22 1806   Code Status: Full Code  Family Communication:  None present at bedside.  Plan of care discussed with patient in length and he/she verbalized understanding and agreed with it.  Status is: Observation The patient will require care spanning > 2 midnights and should be moved to inpatient because: Needs further observation since she is still orthostatic hypotensive.   Estimated body mass index is 26.92 kg/m as calculated from the following:   Height as of this encounter: 4\' 9"  (1.448 m).   Weight as of this encounter: 56.4 kg.  Nutritional Assessment: Body mass index is 26.92 kg/m.Marland Kitchen Seen by dietician.  I agree with the assessment and plan as outlined below: Nutrition Status:        . Skin Assessment: I have examined the patient's skin and I  agree with the wound assessment as performed by the wound care RN as outlined below:    Consultants:  None  Procedures:  None  Antimicrobials:  Anti-infectives (From admission, onward)    None         Subjective: Seen and examined this morning in the ED, she was feeling fine and did not have any complaint however later on she was positive orthostatic.  Objective: Vitals:   06/03/22 1216 06/03/22 1321 06/03/22 1430 06/03/22 1440  BP: (!) 179/82 (!) 176/88 (!) 179/83 (!) 188/100  Pulse: 62   87  Resp:      Temp:      TempSrc:      SpO2: 98%   93%  Weight:      Height:        Intake/Output Summary (Last 24 hours) at 06/03/2022 1555 Last data filed at 06/02/2022 2131 Gross per 24 hour  Intake 500 ml  Output --  Net 500 ml   Filed Weights   06/02/22 1332 06/03/22 0927  Weight: 56.6 kg 56.4 kg    Examination:  General exam: Appears calm and comfortable  Respiratory system: Clear to auscultation. Respiratory effort normal. Cardiovascular system: S1 & S2 heard, RRR. No JVD, murmurs, rubs, gallops or clicks. No pedal edema. Gastrointestinal system: Abdomen is nondistended, soft and nontender. No organomegaly or masses felt. Normal bowel sounds heard. Central nervous system: Alert and oriented. No focal neurological deficits. Extremities: Symmetric 5 x 5 power. Skin: No rashes, lesions or ulcers Psychiatry: Judgement and insight appear normal. Mood & affect appropriate.    Data Reviewed: I have personally reviewed following labs and imaging studies  CBC: Recent Labs  Lab 06/02/22 1257 06/03/22 0338  WBC 6.5 5.8  HGB 11.9* 10.6*  HCT 35.7* 32.3*  MCV 94.2 95.3  PLT 192 163   Basic Metabolic Panel: Recent Labs  Lab 06/02/22 1500 06/02/22 1930 06/03/22 0338  NA 134*  --  132*  K 3.7  --  3.3*  CL 97*  --  101  CO2 29  --  26  GLUCOSE 113*  --  97  BUN 13  --  14  CREATININE 0.79  --  0.82  CALCIUM 8.8*  --  8.4*  MG  --  1.7  --     GFR: Estimated Creatinine Clearance: 31.6 mL/min (by C-G formula based on SCr of 0.82 mg/dL). Liver Function Tests: Recent Labs  Lab 06/03/22 0338  AST 17  ALT 9  ALKPHOS 45  BILITOT 0.7  PROT 5.8*  ALBUMIN 2.8*   No results for input(s): "LIPASE", "AMYLASE" in the last 168 hours. No results for input(s): "AMMONIA" in the last 168 hours. Coagulation Profile: No results for input(s): "INR", "PROTIME" in the last 168 hours. Cardiac Enzymes: No results for input(s): "CKTOTAL", "CKMB", "CKMBINDEX", "TROPONINI" in the last 168 hours. BNP (last 3 results) No results for input(s): "PROBNP" in the last 8760 hours. HbA1C: No results for input(s): "HGBA1C" in the last 72 hours. CBG: Recent Labs  Lab 06/02/22 1526  GLUCAP 117*   Lipid Profile: No results for input(s): "CHOL", "HDL", "LDLCALC", "TRIG", "CHOLHDL", "LDLDIRECT" in the last 72 hours. Thyroid Function Tests: No results for input(s): "TSH", "T4TOTAL", "FREET4", "T3FREE", "THYROIDAB" in the  last 72 hours. Anemia Panel: No results for input(s): "VITAMINB12", "FOLATE", "FERRITIN", "TIBC", "IRON", "RETICCTPCT" in the last 72 hours. Sepsis Labs: No results for input(s): "PROCALCITON", "LATICACIDVEN" in the last 168 hours.  No results found for this or any previous visit (from the past 240 hour(s)).   Radiology Studies: VAS US CAROTID  Result Date: 06/03/2022 Carotid Arterial Duplex Study Patient Name:  KAROLEE MELONI  Date of Exam:   06/03/2022 Medical Rec #: [id number]          Accession #:    [id number] Date of Birth: 01-27-1928           Patient Gender: F Patient Age:   89 years Exam Location:  [facility] Procedure:      VAS US CAROTID Referring Phys: Ramiro Harvest --------------------------------------------------------------------------------  Indications:       Syncope. Risk Factors:      Hypertension, hyperlipidemia, coronary artery disease. Comparison Study:  Previous exam 01/12/20, Bilateral 1-39% stenosis.  Performing Technologist: Magdalene River BS, RVT  Examination Guidelines: A complete evaluation includes B-mode imaging, spectral Doppler, color Doppler, and power Doppler as needed of all accessible portions of each vessel. Bilateral testing is considered an integral part of a complete examination. Limited examinations for reoccurring indications may be performed as noted.  Right Carotid Findings: +----------+--------+--------+--------+------------------+--------+           PSV cm/sEDV cm/sStenosisPlaque DescriptionComments +----------+--------+--------+--------+------------------+--------+ CCA Prox  70      14              heterogenous               +----------+--------+--------+--------+------------------+--------+ CCA Distal52      13              heterogenous               +----------+--------+--------+--------+------------------+--------+ ICA Prox  41      12              calcific                   +----------+--------+--------+--------+------------------+--------+ ICA Distal44      11                                         +----------+--------+--------+--------+------------------+--------+ ECA       42      0               heterogenous               +----------+--------+--------+--------+------------------+--------+ +----------+--------+-------+----------------+-------------------+           PSV cm/sEDV cmsDescribe        Arm Pressure (mmHG) +----------+--------+-------+----------------+-------------------+ [id number]             Multiphasic, WNL                    +----------+--------+-------+----------------+-------------------+ +---------+--------+--+--------+--+--------------------------------------------+ VertebralPSV cm/s85EDV cm/s18Antegrade and Increased velocity compared to                              adjacent segment.                            +---------+--------+--+--------+--+--------------------------------------------+  Left  Carotid Findings: +----------+--------+--------+--------+------------------+--------+           PSV cm/sEDV cm/sStenosisPlaque  DescriptionComments +----------+--------+--------+--------+------------------+--------+ CCA Prox  63      13              heterogenous               +----------+--------+--------+--------+------------------+--------+ CCA Distal78      15              heterogenous               +----------+--------+--------+--------+------------------+--------+ ICA Prox  48      8               heterogenous      tortuous +----------+--------+--------+--------+------------------+--------+ ICA Distal52      13                                tortuous +----------+--------+--------+--------+------------------+--------+ ECA       50      4               heterogenous               +----------+--------+--------+--------+------------------+--------+ +----------+--------+--------+----------------+-------------------+           PSV cm/sEDV cm/sDescribe        Arm Pressure (mmHG) +----------+--------+--------+----------------+-------------------+ [id number]             Multiphasic, WNL                    +----------+--------+--------+----------------+-------------------+ +---------+--------+--+--------+-+---------+ VertebralPSV cm/s21EDV cm/s5Antegrade +---------+--------+--+--------+-+---------+   Summary: Right Carotid: Velocities in the right ICA are consistent with a 1-39% stenosis. Left Carotid: Velocities in the left ICA are consistent with a 1-39% stenosis. Vertebrals:  Bilateral vertebral arteries demonstrate antegrade flow. Subclavians: Normal flow hemodynamics were seen in bilateral subclavian              arteries. *See table(s) above for measurements and observations.     Preliminary    ECHOCARDIOGRAM COMPLETE  Result Date: 06/03/2022    ECHOCARDIOGRAM REPORT   Patient Name:   [name] Date of Exam: 06/03/2022 Medical Rec #:   [id number]         Height:       57.0 in Accession #:    [id number]        Weight:       124.8 lb Date of Birth:  1928-05-04          BSA:          1.472 m Patient Age:    89 years          BP:           152/62 mmHg Patient Gender: F                 HR:           62 bpm. Exam Location:  Inpatient Procedure: 2D Echo, Cardiac Doppler and Color Doppler Indications:    Syncope  History:        Patient has no prior history of Echocardiogram examinations.                 CAD; Risk Factors:Hypertension.  Sonographer:    Cleatis Polka Referring Phys: [id number] DANIEL V THOMPSON IMPRESSIONS  1. Left ventricular ejection fraction, by estimation, is 55 to 60%. The left ventricle has normal function. The left ventricle has no regional wall motion abnormalities. There is mild left ventricular hypertrophy of the basal-septal segment. Left ventricular diastolic parameters are  consistent with Grade I diastolic dysfunction (impaired relaxation).  2. Right ventricular systolic function is normal. The right ventricular size is normal. Tricuspid regurgitation signal is inadequate for assessing PA pressure.  3. The mitral valve is degenerative. Mild mitral valve regurgitation. No evidence of mitral stenosis.  4. The aortic valve is calcified. There is severe calcifcation of the aortic valve. There is severe thickening of the aortic valve. Aortic valve regurgitation is trivial. Aortic valve sclerosis/calcification is present, without any evidence of aortic stenosis. Aortic regurgitation PHT measures 626 msec. Aortic valve Vmax measures 1.29 m/s.  5. The inferior vena cava is normal in size with greater than 50% respiratory variability, suggesting right atrial pressure of 3 mmHg. FINDINGS  Left Ventricle: Left ventricular ejection fraction, by estimation, is 55 to 60%. The left ventricle has normal function. The left ventricle has no regional wall motion abnormalities. The left ventricular internal cavity size was normal in size. There is  mild  left ventricular hypertrophy of the basal-septal segment. Left ventricular diastolic parameters are consistent with Grade I diastolic dysfunction (impaired relaxation). Normal left ventricular filling pressure. Right Ventricle: The right ventricular size is normal. No increase in right ventricular wall thickness. Right ventricular systolic function is normal. Tricuspid regurgitation signal is inadequate for assessing PA pressure. Left Atrium: Left atrial size was normal in size. Right Atrium: Right atrial size was normal in size. Pericardium: There is no evidence of pericardial effusion. Mitral Valve: The mitral valve is degenerative in appearance. There is mild thickening of the mitral valve leaflet(s). Mild mitral annular calcification. Mild mitral valve regurgitation. No evidence of mitral valve stenosis. Tricuspid Valve: The tricuspid valve is normal in structure. Tricuspid valve regurgitation is not demonstrated. No evidence of tricuspid stenosis. Aortic Valve: The aortic valve is calcified. There is severe calcifcation of the aortic valve. There is severe thickening of the aortic valve. Aortic valve regurgitation is trivial. Aortic regurgitation PHT measures 626 msec. Aortic valve sclerosis/calcification is present, without any evidence of aortic stenosis. Aortic valve peak gradient measures 6.7 mmHg. Pulmonic Valve: The pulmonic valve was normal in structure. Pulmonic valve regurgitation is trivial. No evidence of pulmonic stenosis. Aorta: The aortic root is normal in size and structure. Venous: The inferior vena cava is normal in size with greater than 50% respiratory variability, suggesting right atrial pressure of 3 mmHg. IAS/Shunts: No atrial level shunt detected by color flow Doppler.  LEFT VENTRICLE PLAX 2D LVIDd:         4.20 cm     Diastology LVIDs:         3.10 cm     LV e' medial:    4.24 cm/s LV PW:         0.90 cm     LV E/e' medial:  10.4 LV IVS:        1.20 cm     LV e' lateral:   5.55 cm/s LVOT  diam:     2.00 cm     LV E/e' lateral: 7.9 LV SV:         52 LV SV Index:   36 LVOT Area:     3.14 cm  LV Volumes (MOD) LV vol d, MOD A2C: 65.9 ml LV vol d, MOD A4C: 70.4 ml LV vol s, MOD A2C: 27.7 ml LV vol s, MOD A4C: 29.9 ml LV SV MOD A2C:     38.2 ml LV SV MOD A4C:     70.4 ml LV SV MOD BP:  39.0 ml RIGHT VENTRICLE            IVC RV Basal diam:  1.90 cm    IVC diam: 1.70 cm RV Mid diam:    1.80 cm RV S prime:     8.59 cm/s TAPSE (M-mode): 1.4 cm LEFT ATRIUM             Index        RIGHT ATRIUM          Index LA diam:        3.40 cm 2.31 cm/m   RA Area:     8.53 cm LA Vol (A2C):   47.5 ml 32.27 ml/m  RA Volume:   12.10 ml 8.22 ml/m LA Vol (A4C):   41.2 ml 27.99 ml/m LA Biplane Vol: 44.7 ml 30.37 ml/m  AORTIC VALVE AV Area (Vmax): 1.81 cm AV Vmax:        129.00 cm/s AV Peak Grad:   6.7 mmHg LVOT Vmax:      74.40 cm/s LVOT Vmean:     49.200 cm/s LVOT VTI:       0.167 m AI PHT:         626 msec  AORTA Ao Root diam: 3.00 cm Ao Asc diam:  3.30 cm MITRAL VALVE MV Area (PHT): 4.39 cm    SHUNTS MV Decel Time: 173 msec    Systemic VTI:  0.17 m MV E velocity: 44.10 cm/s  Systemic Diam: 2.00 cm MV A velocity: 86.00 cm/s MV E/A ratio:  0.51 Armanda Magicraci Turner MD Electronically signed by Armanda Magicraci Turner MD Signature Date/Time: 06/03/2022/12:31:21 PM    Final    CT Angio Chest Pulmonary Embolism (PE) W or WO Contrast  Result Date: 06/03/2022 CLINICAL DATA:  Syncope. EXAM: CT ANGIOGRAPHY CHEST WITH CONTRAST TECHNIQUE: Multidetector CT imaging of the chest was performed using the standard protocol during bolus administration of intravenous contrast. Multiplanar CT image reconstructions and MIPs were obtained to evaluate the vascular anatomy. RADIATION DOSE REDUCTION: This exam was performed according to the departmental dose-optimization program which includes automated exposure control, adjustment of the mA and/or kV according to patient size and/or use of iterative reconstruction technique. CONTRAST:  70mL OMNIPAQUE  IOHEXOL 350 MG/ML SOLN COMPARISON:  None Available. FINDINGS: Cardiovascular: Satisfactory opacification of the pulmonary arteries to the segmental level. No evidence of pulmonary embolism. Normal heart size. No pericardial effusion. Thoracic aortic atherosclerosis. Coronary artery atherosclerosis. Mediastinum/Nodes: No enlarged mediastinal, hilar, or axillary lymph nodes. Thyroid gland, trachea, and esophagus demonstrate no significant findings. Lungs/Pleura: Bilateral chronic interstitial thickening. No pleural effusion or pneumothorax. No focal consolidation. Upper Abdomen: No acute abnormality. Musculoskeletal: No acute osseous abnormality. No aggressive osseous lesion. Generalized osteopenia. Review of the MIP images confirms the above findings. IMPRESSION: 1. No pulmonary embolism. 2. Mild chronic interstitial lung disease. 3. Aortic Atherosclerosis (ICD10-I70.0). 4. Multi vessel coronary artery atherosclerosis. Electronically Signed   By: Elige KoHetal  Patel M.D.   On: 06/03/2022 10:35   X-ray chest PA and lateral  Result Date: 06/02/2022 CLINICAL DATA:  Syncope today. EXAM: CHEST - 2 VIEW COMPARISON:  10/20/2021 FINDINGS: The cardiomediastinal contours are stable. Coronary artery calcifications. Aortic atherosclerosis. Chronic biapical pleuroparenchymal opacity. Chronic interstitial coarsening. Pulmonary vasculature is normal. No consolidation, pleural effusion, or pneumothorax. No acute osseous abnormalities are seen. Chronic shoulder arthropathy. IMPRESSION: No acute abnormality. Stable radiographic appearance of chronic lung disease Electronically Signed   By: Narda RutherfordMelanie  Sanford M.D.   On: 06/02/2022 19:12   CT Head Wo Contrast  Result  Date: 06/02/2022 CLINICAL DATA:  Syncope EXAM: CT HEAD WITHOUT CONTRAST TECHNIQUE: Contiguous axial images were obtained from the base of the skull through the vertex without intravenous contrast. RADIATION DOSE REDUCTION: This exam was performed according to the  departmental dose-optimization program which includes automated exposure control, adjustment of the mA and/or kV according to patient size and/or use of iterative reconstruction technique. COMPARISON:  Head CT dated Apr 03, 2020 FINDINGS: Brain: Chronic white matter ischemic change and generalized volume loss. No evidence of acute infarction, hemorrhage, hydrocephalus, extra-axial collection or mass lesion/mass effect. Vascular: No hyperdense vessel or unexpected calcification. Skull: Normal. Negative for fracture or focal lesion. Sinuses/Orbits: No acute finding. Other: None. IMPRESSION: No acute intracranial abnormality. Electronically Signed   By: Allegra Lai M.D.   On: 06/02/2022 15:17    Scheduled Meds:  aspirin EC  81 mg Oral Daily   atenolol  50 mg Oral Daily   cholecalciferol  1,000 Units Oral Daily   clopidogrel  75 mg Oral Daily   enoxaparin (LOVENOX) injection  40 mg Subcutaneous Q24H   ezetimibe  10 mg Oral Daily   famotidine  20 mg Oral Daily   hydrochlorothiazide  25 mg Oral Daily   isosorbide mononitrate  90 mg Oral Daily   losartan  25 mg Oral Daily   sodium chloride flush  3 mL Intravenous Q12H   Continuous Infusions:  sodium chloride Stopped (06/03/22 0558)     LOS: 0 days   Hughie Closs, MD [facility]  06/03/2022, 3:55 PM   *Please note that this is a verbal dictation therefore any spelling or grammatical errors are due to the "Dragon [employer] One" system interpretation.  Please page via Amion and do not message via secure chat for urgent patient care matters. Secure chat can be used for non urgent patient care matters.  How to contact the [facility] Attending or Consulting provider 7A - 7P or covering provider during after hours 7P -7A, for this patient?  Check the care team in [facility] and look for a) attending/consulting TRH provider listed and b) the [facility] team listed. Page or secure chat 7A-7P. Log into [email or web address] and use [facility]'s universal password to  access. If you do not have the password, please contact the hospital operator. Locate the [facility] provider you are looking for under [facility] and page to a number that you can be directly reached. If you still have difficulty reaching the provider, please page the [facility] (Director on Call) for the Hospitalists listed on amion for assistance.

## 2022-06-03 NOTE — Care Management (Signed)
  Transition of Care [facility]) Screening Note   Patient Details  Name: [name] Date of Birth: 1928-11-08   Transition of Care [facility]) CM/SW Contact:    Gala Lewandowsky, RN Phone Number: 06/03/2022, 4:32 PM    Transition of Care Department [facility]) has reviewed the patient and no TOC needs have been identified at this time. Case Manager discussed home health recommendations with the patient and daughter. Daughter states that the patient is pretty active in and outside of the home. Patient declined [facility] Services. Patient is from home with the support of granddaughter. No further home needs identified at this time.

## 2022-06-03 NOTE — Evaluation (Signed)
Physical Therapy Evaluation Patient Details Name: [name] MRN: [id number] DOB: 10-Mar-1928 Today's Date: 06/03/2022  History of Present Illness  Pt is a 88 y/o female admitted secondary to syncopal episode. PMH includes asthma, HTN, CAD s/p stent  Clinical Impression  Pt admitted secondary to problem above with deficits below. Pt requiring up to min A for steadying. As mobility progressed, pt with increased fatigue and unsteadiness with 2-3 LOB. Recommending HHPT at d/c to address current deficits. Will continue to follow acutely.        Recommendations for follow up therapy are one component of a multi-disciplinary discharge planning process, led by the attending physician.  Recommendations may be updated based on patient status, additional functional criteria and insurance authorization.  Follow Up Recommendations Home health PT      Assistance Recommended at Discharge Intermittent Supervision/Assistance  Patient can return home with the following  A little help with walking and/or transfers;Assistance with cooking/housework;Help with stairs or ramp for entrance;Assist for transportation    Equipment Recommendations None recommended by PT  Recommendations for Other Services       Functional Status Assessment Patient has had a recent decline in their functional status and demonstrates the ability to make significant improvements in function in a reasonable and predictable amount of time.     Precautions / Restrictions Precautions Precautions: Fall Restrictions Weight Bearing Restrictions: No      Mobility  Bed Mobility Overal bed mobility: Modified Independent                  Transfers Overall transfer level: Needs assistance Equipment used: None Transfers: Sit to/from Stand Sit to Stand: Min guard           General transfer comment: Min guard for safety    Ambulation/Gait Ambulation/Gait assistance: Min guard, Min assist Gait Distance (Feet):  200 Feet Assistive device: None Gait Pattern/deviations: Step-through pattern, Decreased stride length, Trendelenburg, Staggering left Gait velocity: Decreased     General Gait Details: Pt initially requiring min guard A, however, with increased distance, pt with increased fatigue and unsteadiness. LOB X2-3 at end of gait requiring up to min A for steadying.  Stairs            Wheelchair Mobility    Modified Rankin (Stroke Patients Only)       Balance Overall balance assessment: Needs assistance Sitting-balance support: No upper extremity supported, Feet supported Sitting balance-Leahy Scale: Fair     Standing balance support: No upper extremity supported, During functional activity Standing balance-Leahy Scale: Fair Standing balance comment: fair static standing balance. Poor dynamic balance                             Pertinent Vitals/Pain Pain Assessment Pain Assessment: No/denies pain    Home Living Family/patient expects to be discharged to:: Private residence Living Arrangements: Other relatives (granddaughter) Available Help at Discharge: Family Type of Home: House Home Access: Stairs to enter Entrance Stairs-Rails: None Entrance Stairs-Number of Steps: 1 Alternate Level Stairs-Number of Steps: flight Home Layout: Laundry or work area in basement;Able to live on main level with bedroom/bathroom;Two level Home Equipment: Agricultural consultant (2 wheels);Cane - quad;Shower seat      Prior Function Prior Level of Function : Independent/Modified Independent             Mobility Comments: uses cane typically for mobility ADLs Comments: Indep     Hand Dominance   Dominant Hand: Right  Extremity/Trunk Assessment   Upper Extremity Assessment Upper Extremity Assessment: Defer to OT evaluation    Lower Extremity Assessment Lower Extremity Assessment: Generalized weakness    Cervical / Trunk Assessment Cervical / Trunk Assessment:  Kyphotic  Communication   Communication: No difficulties  Cognition Arousal/Alertness: Awake/alert Behavior During Therapy: WFL for tasks assessed/performed Overall Cognitive Status: No family/caregiver present to determine baseline cognitive functioning                                 General Comments: Decreased safety awareness noted        General Comments      Exercises     Assessment/Plan    PT Assessment Patient needs continued PT services  PT Problem List Decreased strength;Decreased activity tolerance;Decreased balance;Decreased mobility;Decreased knowledge of use of DME;Decreased knowledge of precautions;Decreased safety awareness       PT Treatment Interventions DME instruction;Gait training;Stair training;Functional mobility training;Therapeutic activities;Therapeutic exercise;Balance training;Patient/family education    PT Goals (Current goals can be found in the Care Plan section)  Acute Rehab PT Goals Patient Stated Goal: to go home PT Goal Formulation: With patient Time For Goal Achievement: 06/17/22 Potential to Achieve Goals: Good    Frequency Min 3X/week     Co-evaluation               AM-PAC PT "6 Clicks" Mobility  Outcome Measure Help needed turning from your back to your side while in a flat bed without using bedrails?: None Help needed moving from lying on your back to sitting on the side of a flat bed without using bedrails?: None Help needed moving to and from a bed to a chair (including a wheelchair)?: A Little Help needed standing up from a chair using your arms (e.g., wheelchair or bedside chair)?: A Little Help needed to walk in hospital room?: A Little Help needed climbing 3-5 steps with a railing? : A Lot 6 Click Score: 19    End of Session Equipment Utilized During Treatment: Gait belt Activity Tolerance: Patient tolerated treatment well Patient left: in bed;with call bell/phone within reach;with bed alarm  set Nurse Communication: Mobility status PT Visit Diagnosis: Unsteadiness on feet (R26.81);Muscle weakness (generalized) (M62.81)    Time: 9518-8416 PT Time Calculation (min) (ACUTE ONLY): 20 min   Charges:   PT Evaluation $PT Eval Moderate Complexity: 1 Mod          Farley Ly, PT, DPT  Acute Rehabilitation Services  Office: [phone]   Lehman Prom 06/03/2022, 3:04 PM

## 2022-06-03 NOTE — ED Notes (Signed)
Pt off the unit.

## 2022-06-03 NOTE — Evaluation (Signed)
Occupational Therapy Evaluation Patient Details Name: [name] MRN: [id number] DOB: December 02, 1927 Today's Date: 06/03/2022   History of Present Illness Pt is a 88 y/o female admitted secondary to syncopal episode. PMH includes asthma, HTN, CAD s/p stent   Clinical Impression   Pt admitted for concerns listed above. PTA pt reported that she is very independent with all ADL's and IADL's. At this time, pt presents near her baseline, able to complete all ADL's and functional mobility with no AD. She has no further skilled OT needs and acute OT will sign off.       Recommendations for follow up therapy are one component of a multi-disciplinary discharge planning process, led by the attending physician.  Recommendations may be updated based on patient status, additional functional criteria and insurance authorization.   Follow Up Recommendations  No OT follow up    Assistance Recommended at Discharge PRN  Patient can return home with the following      Functional Status Assessment  Patient has had a recent decline in their functional status and demonstrates the ability to make significant improvements in function in a reasonable and predictable amount of time.  Equipment Recommendations  None recommended by OT    Recommendations for Other Services       Precautions / Restrictions Precautions Precautions: Fall Restrictions Weight Bearing Restrictions: No      Mobility Bed Mobility Overal bed mobility: Modified Independent                  Transfers Overall transfer level: Needs assistance Equipment used: None Transfers: Sit to/from Stand Sit to Stand: Min guard           General transfer comment: Min guard for safety      Balance Overall balance assessment: Needs assistance Sitting-balance support: No upper extremity supported, Feet supported Sitting balance-Leahy Scale: Fair     Standing balance support: No upper extremity supported, During functional  activity Standing balance-Leahy Scale: Fair Standing balance comment: fair static standing balance. Poor dynamic balance                           ADL either performed or assessed with clinical judgement   ADL Overall ADL's : Modified independent                                             Vision Baseline Vision/History: 1 Wears glasses Ability to See in Adequate Light: 0 Adequate Patient Visual Report: No change from baseline Vision Assessment?: No apparent visual deficits     Perception     Praxis      Pertinent Vitals/Pain Pain Assessment Pain Assessment: No/denies pain     Hand Dominance Right   Extremity/Trunk Assessment Upper Extremity Assessment Upper Extremity Assessment: Generalized weakness   Lower Extremity Assessment Lower Extremity Assessment: Generalized weakness   Cervical / Trunk Assessment Cervical / Trunk Assessment: Kyphotic   Communication Communication Communication: No difficulties   Cognition Arousal/Alertness: Awake/alert Behavior During Therapy: WFL for tasks assessed/performed Overall Cognitive Status: No family/caregiver present to determine baseline cognitive functioning                                 General Comments: Decreased safety awareness noted     General Comments  VSS on RA    Exercises     Shoulder Instructions      Home Living Family/patient expects to be discharged to:: Private residence Living Arrangements: Other relatives (granddaughter) Available Help at Discharge: Family Type of Home: House Home Access: Stairs to enter Secretary/administrator of Steps: 1 Entrance Stairs-Rails: None Home Layout: Laundry or work area in basement;Able to live on main level with bedroom/bathroom;Two level Alternate Level Stairs-Number of Steps: flight Alternate Level Stairs-Rails: Right Bathroom Shower/Tub: Producer, television/film/video: Standard     Home Equipment: Clinical biochemist (2 wheels);Cane - quad;Shower seat          Prior Functioning/Environment Prior Level of Function : Independent/Modified Independent             Mobility Comments: uses cane typically for mobility ADLs Comments: Indep        OT Problem List: Decreased strength;Decreased activity tolerance;Impaired balance (sitting and/or standing);Decreased safety awareness      OT Treatment/Interventions:      OT Goals(Current goals can be found in the care plan section) Acute Rehab OT Goals Patient Stated Goal: To go home OT Goal Formulation: With patient Time For Goal Achievement: 06/03/22 Potential to Achieve Goals: Good  OT Frequency:      Co-evaluation              AM-PAC OT "6 Clicks" Daily Activity     Outcome Measure Help from another person eating meals?: None Help from another person taking care of personal grooming?: None Help from another person toileting, which includes using toliet, bedpan, or urinal?: None Help from another person bathing (including washing, rinsing, drying)?: None Help from another person to put on and taking off regular upper body clothing?: None Help from another person to put on and taking off regular lower body clothing?: None 6 Click Score: 24   End of Session Equipment Utilized During Treatment: Rolling walker (2 wheels) Nurse Communication: Mobility status  Activity Tolerance: Patient tolerated treatment well Patient left: in bed;with call bell/phone within reach  OT Visit Diagnosis: Unsteadiness on feet (R26.81);Other abnormalities of gait and mobility (R26.89);Muscle weakness (generalized) (M62.81)                Time: 6761-9509 OT Time Calculation (min): 18 min Charges:  OT General Charges $OT Visit: 1 Visit OT Evaluation $OT Eval Moderate Complexity: 1 Mod  Kaylana Fenstermacher H., OTR/L Acute Rehabilitation  Hopie Pellegrin Elane Bing Plume 06/03/2022, 5:42 PM

## 2022-06-03 NOTE — Progress Notes (Signed)
Bilateral carotid artery duplex study completed. Please see CV Proc for preliminary results.  Theodora Lalanne BS, RVT 06/03/2022 11:59 AM

## 2022-06-03 NOTE — Care Management Obs Status (Signed)
MEDICARE OBSERVATION STATUS NOTIFICATION   Patient Details  Name: [name] MRN: [id number] Date of Birth: [birth year]   Medicare Observation Status Notification Given:  Yes    Gala Lewandowsky, RN 06/03/2022, 4:30 PM

## 2022-06-04 DIAGNOSIS — Z7902 Long term (current) use of antithrombotics/antiplatelets: Secondary | ICD-10-CM | POA: Diagnosis not present

## 2022-06-04 DIAGNOSIS — K219 Gastro-esophageal reflux disease without esophagitis: Secondary | ICD-10-CM | POA: Diagnosis present

## 2022-06-04 DIAGNOSIS — R55 Syncope and collapse: Secondary | ICD-10-CM | POA: Diagnosis present

## 2022-06-04 DIAGNOSIS — Z79899 Other long term (current) drug therapy: Secondary | ICD-10-CM | POA: Diagnosis not present

## 2022-06-04 DIAGNOSIS — I951 Orthostatic hypotension: Secondary | ICD-10-CM | POA: Diagnosis present

## 2022-06-04 DIAGNOSIS — E782 Mixed hyperlipidemia: Secondary | ICD-10-CM | POA: Diagnosis not present

## 2022-06-04 DIAGNOSIS — M1990 Unspecified osteoarthritis, unspecified site: Secondary | ICD-10-CM | POA: Diagnosis present

## 2022-06-04 DIAGNOSIS — I5032 Chronic diastolic (congestive) heart failure: Secondary | ICD-10-CM | POA: Diagnosis present

## 2022-06-04 DIAGNOSIS — Z955 Presence of coronary angioplasty implant and graft: Secondary | ICD-10-CM | POA: Diagnosis not present

## 2022-06-04 DIAGNOSIS — Z885 Allergy status to narcotic agent status: Secondary | ICD-10-CM | POA: Diagnosis not present

## 2022-06-04 DIAGNOSIS — E871 Hypo-osmolality and hyponatremia: Secondary | ICD-10-CM | POA: Diagnosis present

## 2022-06-04 DIAGNOSIS — Z7982 Long term (current) use of aspirin: Secondary | ICD-10-CM | POA: Diagnosis not present

## 2022-06-04 DIAGNOSIS — I110 Hypertensive heart disease with heart failure: Secondary | ICD-10-CM | POA: Diagnosis present

## 2022-06-04 DIAGNOSIS — Z8249 Family history of ischemic heart disease and other diseases of the circulatory system: Secondary | ICD-10-CM | POA: Diagnosis not present

## 2022-06-04 DIAGNOSIS — E876 Hypokalemia: Secondary | ICD-10-CM | POA: Diagnosis present

## 2022-06-04 DIAGNOSIS — Z888 Allergy status to other drugs, medicaments and biological substances status: Secondary | ICD-10-CM | POA: Diagnosis not present

## 2022-06-04 DIAGNOSIS — E559 Vitamin D deficiency, unspecified: Secondary | ICD-10-CM | POA: Diagnosis present

## 2022-06-04 DIAGNOSIS — I2510 Atherosclerotic heart disease of native coronary artery without angina pectoris: Secondary | ICD-10-CM | POA: Diagnosis present

## 2022-06-04 DIAGNOSIS — I10 Essential (primary) hypertension: Secondary | ICD-10-CM | POA: Diagnosis not present

## 2022-06-04 DIAGNOSIS — E785 Hyperlipidemia, unspecified: Secondary | ICD-10-CM | POA: Diagnosis present

## 2022-06-04 LAB — BASIC METABOLIC PANEL
Anion gap: 11 (ref 5–15)
BUN: 15 mg/dL (ref 8–23)
CO2: 29 mmol/L (ref 22–32)
Calcium: 9.8 mg/dL (ref 8.9–10.3)
Chloride: 93 mmol/L — ABNORMAL LOW (ref 98–111)
Creatinine, Ser: 0.94 mg/dL (ref 0.44–1.00)
GFR, Estimated: 57 mL/min — ABNORMAL LOW (ref >60)
Glucose, Bld: 111 mg/dL — ABNORMAL HIGH (ref 70–99)
Potassium: 4.3 mmol/L (ref 3.5–5.1)
Sodium: 133 mmol/L — ABNORMAL LOW (ref 135–145)

## 2022-06-04 LAB — GLUCOSE, CAPILLARY: Glucose-Capillary: 98 mg/dL (ref 70–99)

## 2022-06-04 MED ORDER — HALOPERIDOL LACTATE 5 MG/ML IJ SOLN
0.5000 mg | Freq: Once | INTRAMUSCULAR | Status: DC
  Filled 2022-06-04: qty 1

## 2022-06-04 MED ORDER — SODIUM CHLORIDE 0.9 % IV SOLN: INTRAVENOUS | Status: AC

## 2022-06-04 MED ORDER — ISOSORBIDE MONONITRATE ER 60 MG PO TB24
60.0000 mg | ORAL_TABLET | Freq: Every day | ORAL | Status: DC
  Administered 2022-06-05 – 2022-06-06 (×2): 60 mg via ORAL
  Filled 2022-06-04 (×2): qty 1

## 2022-06-04 MED ORDER — ENOXAPARIN SODIUM 30 MG/0.3ML IJ SOSY
30.0000 mg | PREFILLED_SYRINGE | INTRAMUSCULAR | Status: DC
  Administered 2022-06-05: 30 mg via SUBCUTANEOUS
  Filled 2022-06-04: qty 0.3

## 2022-06-04 MED ORDER — HALOPERIDOL LACTATE 5 MG/ML IJ SOLN
1.0000 mg | Freq: Once | INTRAMUSCULAR | Status: AC
Start: 2022-06-04 — End: 2022-06-04
  Administered 2022-06-04: 1 mg via INTRAVENOUS

## 2022-06-04 MED ORDER — MIDODRINE HCL 5 MG PO TABS
5.0000 mg | ORAL_TABLET | Freq: Three times a day (TID) | ORAL | Status: DC
Start: 2022-06-04 — End: 2022-06-05
  Administered 2022-06-04: 5 mg via ORAL
  Filled 2022-06-04: qty 1

## 2022-06-04 NOTE — Progress Notes (Signed)
Physical Therapy Treatment Patient Details Name: [name] MRN: [id number] DOB: [birth year] Today's Date: 06/04/2022   History of Present Illness Pt is a 88 y/o female admitted secondary to syncopal episode. PMH includes asthma, HTN, CAD s/p stent    PT Comments    Pt with improving mobility but continues to have some increased instability with gait as she fatigues after inactivity of last several days. Recommend to pt that she use her walker initially when going home. Will continue to follow.    Recommendations for follow up therapy are one component of a multi-disciplinary discharge planning process, led by the attending physician.  Recommendations may be updated based on patient status, additional functional criteria and insurance authorization.  Follow Up Recommendations  Home health PT     Assistance Recommended at Discharge Intermittent Supervision/Assistance  Patient can return home with the following A little help with walking and/or transfers;Assistance with cooking/housework;Help with stairs or ramp for entrance;Assist for transportation   Equipment Recommendations  None recommended by PT    Recommendations for Other Services       Precautions / Restrictions Precautions Precautions: Fall Restrictions Weight Bearing Restrictions: No     Mobility  Bed Mobility               General bed mobility comments: Pt up in chair    Transfers Overall transfer level: Needs assistance Equipment used: Straight cane Transfers: Sit to/from Stand Sit to Stand: Min guard           General transfer comment: Min guard for safety. Incr time to rise from chair without armrests    Ambulation/Gait Ambulation/Gait assistance: Min guard Gait Distance (Feet): 200 Feet Assistive device: Straight cane, 1 person hand held assist Gait Pattern/deviations: Step-through pattern, Decreased stride length, Trendelenburg Gait velocity: Decreased Gait velocity interpretation:  1.31 - 2.62 ft/sec, indicative of limited community ambulator   General Gait Details: Assist for safety with pt using her straight cane. As pt fatigued she needed HHA as well   Stairs             Wheelchair Mobility    Modified Rankin (Stroke Patients Only)       Balance Overall balance assessment: Needs assistance Sitting-balance support: No upper extremity supported, Feet supported Sitting balance-Leahy Scale: Fair     Standing balance support: No upper extremity supported, During functional activity Standing balance-Leahy Scale: Fair                              Cognition Arousal/Alertness: Awake/alert Behavior During Therapy: WFL for tasks assessed/performed                                   General Comments: Likely baseline memory deficits        Exercises      General Comments General comments (skin integrity, edema, etc.): VSS on RA      Pertinent Vitals/Pain      Home Living                          Prior Function            PT Goals (current goals can now be found in the care plan section) Acute Rehab PT Goals Patient Stated Goal: to go home Progress towards PT goals: Progressing toward goals    Frequency  Min 3X/week      PT Plan Current plan remains appropriate    Co-evaluation              AM-PAC PT "6 Clicks" Mobility   Outcome Measure  Help needed turning from your back to your side while in a flat bed without using bedrails?: None Help needed moving from lying on your back to sitting on the side of a flat bed without using bedrails?: None Help needed moving to and from a bed to a chair (including a wheelchair)?: A Little Help needed standing up from a chair using your arms (e.g., wheelchair or bedside chair)?: A Little Help needed to walk in hospital room?: A Little Help needed climbing 3-5 steps with a railing? : A Lot 6 Click Score: 19    End of Session   Activity  Tolerance: Patient tolerated treatment well Patient left: with call bell/phone within reach;in chair;with family/visitor present (Pt sitting in chair by window with family member in recliner nearby as she was when I entered room) Nurse Communication: Mobility status PT Visit Diagnosis: Unsteadiness on feet (R26.81);Muscle weakness (generalized) (M62.81)     Time: 4332-9518 PT Time Calculation (min) (ACUTE ONLY): 12 min  Charges:  $Gait Training: 8-22 mins                     [facility] PT Acute Rehabilitation Services Office [phone]    Angelina Ok [facility] 06/04/2022, 4:24 PM

## 2022-06-04 NOTE — Progress Notes (Signed)
PROGRESS NOTE    [name]  OHY:[id number] DOB: 10-May-1928 DOA: 06/02/2022 PCP: [name], NP   Brief Narrative:  HPI: [name] is a 88 y.o. female with medical history significant of hypertension, CAD status post multiple stents, GERD, hypertension, vitamin D deficiency presented to the ED after syncopal episode.  Patient and daughter gave the history and stated that this morning patient took her blood pressure medications was in the car with her daughter and daughter stopped at the bank.  Daughter went into the bank and when she came back out it was noted that patient's eyes were closed, mouth open some occasional yawning with unresponsiveness.  After 5 minutes daughter called 911 on route to the hospital stopped on the side of the street and EMS arrived.  Patient does endorse some dizziness prior to syncopal episode.  After the patient was placed in the EMS truck responsiveness improved and patient back to baseline.  Patient denies any fever, no chills, no chest pain, no shortness of breath, no nausea, no vomiting, no abdominal pain, no diarrhea, no constipation, no hematemesis, no hematochezia, no melena.  Patient denies any tongue biting or urinary incontinence.  Patient with no recent surgeries, no recent long car rides.  Patient with no prior history of DVTs.   ED Course: Patient seen in the ED noted to have soft blood pressure with systolics in the 90s to low 100s.  Basic metabolic profile obtained with a sodium of 134, chloride of 97, glucose of 113, calcium of 8.8 otherwise was within normal limits.  CBC obtained with a hemoglobin of 11.9 otherwise within normal limits.  Blood glucose noted at 113.  High-sensitivity troponin negative.  CT head done with no acute intracranial abnormalities.  Orthostatics checked in the ED noted to be positive.  Hospitalist were called to admit the patient for further evaluation and management.  Assessment & Plan:   Principal  Problem:   Syncope Active Problems:   Orthostatic hypotension   Hyperlipidemia   GERD (gastroesophageal reflux disease)   Vitamin D deficiency   Essential hypertension   CAD (coronary artery disease)  syncopal episode/orthostatic hypotension: Syncopal episode likely due to orthostatic hypotension which was evidenced at the time of admission.  CT head negative.  Doppler carotid unremarkable.  Echo shows normal ejection fraction with grade 1 diastolic dysfunction but no significant valvular defect.  Patient is still orthostatic positive but blood pressure also elevated requiring antihypertensives, very challenging situation.  I will try to reduce her Imdur from 90mg  to 60 mg but will have to continue rest of the antihypertensives with the losartan 25 mg and low-dose of atenolol.  We will start her on midodrine trial 5 mg p.o. 3 times daily.  Continue to monitor orthostatics every shift.  Start on gentle hydration.  GERD -Pepcid.  Hypertension: Elevated.  See details above.  CAD: Asymptomatic. -Continue home regimen aspirin, Plavix.  Hyperlipidemia -Continue Zetia.  7Vitamin D deficiency -Resume oral vitamin D supplementation.  Hypokalemia: Resolved  DVT prophylaxis: enoxaparin (LOVENOX) injection 30 mg Start: 06/04/22 2000 TED hose Start: 06/02/22 1806   Code Status: Full Code  Family Communication:  None present at bedside.  Plan of care discussed with patient in length and he/she verbalized understanding and agreed with it.  Left a voicemail to the daughter.  Status is: Inpatient Remains inpatient appropriate because: Still positive orthostatics     Estimated body mass index is 26.38 kg/m as calculated from the following:   Height as  of this encounter: 4\' 9"  (1.448 m).   Weight as of this encounter: 55.3 kg.    Nutritional Assessment: Body mass index is 26.38 kg/m. Seen by dietician.  I agree with the assessment and plan as outlined below: Nutrition Status:         . Skin Assessment: I have examined the patient's skin and I agree with the wound assessment as performed by the wound care RN as outlined below:    Consultants:  None  Procedures:  None  Antimicrobials:  Anti-infectives (From admission, onward)    None         Subjective: Seen and examined.  She has no complaints.  She denies any dizziness even when she is having positive orthostatics on the vitals.  Objective: Vitals:   06/03/22 1440 06/03/22 2051 06/04/22 0330 06/04/22 0331  BP: (!) 188/100 (!) 148/78  (!) 174/75  Pulse: 87 78  76  Resp:  16  18  Temp:  97.6 F (36.4 C)  97.8 F (36.6 C)  TempSrc:  Oral  Oral  SpO2: 93% 94%    Weight:   55.3 kg   Height:        Intake/Output Summary (Last 24 hours) at 06/04/2022 1220 Last data filed at 06/03/2022 1700 Gross per 24 hour  Intake 846.67 ml  Output --  Net 846.67 ml    Filed Weights   06/02/22 1332 06/03/22 0927 06/04/22 0330  Weight: 56.6 kg 56.4 kg 55.3 kg    Examination:  General exam: Appears calm and comfortable  Respiratory system: Clear to auscultation. Respiratory effort normal. Cardiovascular system: S1 & S2 heard, RRR. No JVD, murmurs, rubs, gallops or clicks. No pedal edema. Gastrointestinal system: Abdomen is nondistended, soft and nontender. No organomegaly or masses felt. Normal bowel sounds heard. Central nervous system: Alert and oriented. No focal neurological deficits. Extremities: Symmetric 5 x 5 power. Skin: No rashes, lesions or ulcers.  Psychiatry: Judgement and insight appear normal. Mood & affect appropriate.    Data Reviewed: I have personally reviewed following labs and imaging studies  CBC: Recent Labs  Lab 06/02/22 1257 06/03/22 0338  WBC 6.5 5.8  HGB 11.9* 10.6*  HCT 35.7* 32.3*  MCV 94.2 95.3  PLT 192 163    Basic Metabolic Panel: Recent Labs  Lab 06/02/22 1500 06/02/22 1930 06/03/22 0338 06/04/22 0845  NA 134*  --  132* 133*  K 3.7  --  3.3* 4.3  CL  97*  --  101 93*  CO2 29  --  26 29  GLUCOSE 113*  --  97 111*  BUN 13  --  14 15  CREATININE 0.79  --  0.82 0.94  CALCIUM 8.8*  --  8.4* 9.8  MG  --  1.7  --   --     GFR: Estimated Creatinine Clearance: 27.3 mL/min (by C-G formula based on SCr of 0.94 mg/dL). Liver Function Tests: Recent Labs  Lab 06/03/22 0338  AST 17  ALT 9  ALKPHOS 45  BILITOT 0.7  PROT 5.8*  ALBUMIN 2.8*    No results for input(s): "LIPASE", "AMYLASE" in the last 168 hours. No results for input(s): "AMMONIA" in the last 168 hours. Coagulation Profile: No results for input(s): "INR", "PROTIME" in the last 168 hours. Cardiac Enzymes: No results for input(s): "CKTOTAL", "CKMB", "CKMBINDEX", "TROPONINI" in the last 168 hours. BNP (last 3 results) No results for input(s): "PROBNP" in the last 8760 hours. HbA1C: No results for input(s): "HGBA1C" in the last  72 hours. CBG: Recent Labs  Lab 06/02/22 1526 06/04/22 0724  GLUCAP 117* 98    Lipid Profile: No results for input(s): "CHOL", "HDL", "LDLCALC", "TRIG", "CHOLHDL", "LDLDIRECT" in the last 72 hours. Thyroid Function Tests: No results for input(s): "TSH", "T4TOTAL", "FREET4", "T3FREE", "THYROIDAB" in the last 72 hours. Anemia Panel: No results for input(s): "VITAMINB12", "FOLATE", "FERRITIN", "TIBC", "IRON", "RETICCTPCT" in the last 72 hours. Sepsis Labs: No results for input(s): "PROCALCITON", "LATICACIDVEN" in the last 168 hours.  No results found for this or any previous visit (from the past 240 hour(s)).   Radiology Studies: VAS US CAROTID  Result Date: 06/03/2022 Carotid Arterial Duplex Study Patient Name:  [name]  Date of Exam:   06/03/2022 Medical Rec #: [id number]          Accession #:    [id number] Date of Birth: [birth year]           Patient Gender: F Patient Age:   20 years Exam Location:  [facility] Procedure:      VAS US CAROTID Referring Phys: Irine Seal  --------------------------------------------------------------------------------  Indications:       Syncope. Risk Factors:      Hypertension, hyperlipidemia, coronary artery disease. Comparison Study:  Previous exam 01/12/20, Bilateral 1-39% stenosis. Performing Technologist: Bobetta Lime BS, RVT  Examination Guidelines: A complete evaluation includes B-mode imaging, spectral Doppler, color Doppler, and power Doppler as needed of all accessible portions of each vessel. Bilateral testing is considered an integral part of a complete examination. Limited examinations for reoccurring indications may be performed as noted.  Right Carotid Findings: +----------+--------+--------+--------+------------------+--------+           PSV cm/sEDV cm/sStenosisPlaque DescriptionComments +----------+--------+--------+--------+------------------+--------+ CCA Prox  70      14              heterogenous               +----------+--------+--------+--------+------------------+--------+ CCA Distal52      13              heterogenous               +----------+--------+--------+--------+------------------+--------+ ICA Prox  41      12              calcific                   +----------+--------+--------+--------+------------------+--------+ ICA Distal44      11                                         +----------+--------+--------+--------+------------------+--------+ ECA       42      0               heterogenous               +----------+--------+--------+--------+------------------+--------+ +----------+--------+-------+----------------+-------------------+           PSV cm/sEDV cmsDescribe        Arm Pressure (mmHG) +----------+--------+-------+----------------+-------------------+ [id number]             Multiphasic, WNL                    +----------+--------+-------+----------------+-------------------+  +---------+--------+--+--------+--+--------------------------------------------+ VertebralPSV cm/s85EDV cm/s18Antegrade and Increased velocity compared to  adjacent segment.                            +---------+--------+--+--------+--+--------------------------------------------+  Left Carotid Findings: +----------+--------+--------+--------+------------------+--------+           PSV cm/sEDV cm/sStenosisPlaque DescriptionComments +----------+--------+--------+--------+------------------+--------+ CCA Prox  63      13              heterogenous               +----------+--------+--------+--------+------------------+--------+ CCA Distal78      15              heterogenous               +----------+--------+--------+--------+------------------+--------+ ICA Prox  48      8               heterogenous      tortuous +----------+--------+--------+--------+------------------+--------+ ICA Distal52      13                                tortuous +----------+--------+--------+--------+------------------+--------+ ECA       50      4               heterogenous               +----------+--------+--------+--------+------------------+--------+ +----------+--------+--------+----------------+-------------------+           PSV cm/sEDV cm/sDescribe        Arm Pressure (mmHG) +----------+--------+--------+----------------+-------------------+ [id number]             Multiphasic, WNL                    +----------+--------+--------+----------------+-------------------+ +---------+--------+--+--------+-+---------+ VertebralPSV cm/s21EDV cm/s5Antegrade +---------+--------+--+--------+-+---------+   Summary: Right Carotid: Velocities in the right ICA are consistent with a 1-39% stenosis. Left Carotid: Velocities in the left ICA are consistent with a 1-39% stenosis. Vertebrals:  Bilateral vertebral arteries demonstrate antegrade flow.  Subclavians: Normal flow hemodynamics were seen in bilateral subclavian              arteries. *See table(s) above for measurements and observations.  Electronically signed by Harold Barban MD on 06/03/2022 at 9:56:46 PM.    Final    ECHOCARDIOGRAM COMPLETE  Result Date: 06/03/2022    ECHOCARDIOGRAM REPORT   Patient Name:   [name] Date of Exam: 06/03/2022 Medical Rec #:  [id number]         Height:       57.0 in Accession #:    [id number]        Weight:       124.8 lb Date of Birth:  [birth year]          BSA:          1.472 m Patient Age:    67 years          BP:           152/62 mmHg Patient Gender: F                 HR:           62 bpm. Exam Location:  Inpatient Procedure: 2D Echo, Cardiac Doppler and Color Doppler Indications:    Syncope  History:        Patient has no prior history of Echocardiogram examinations.  CAD; Risk Factors:Hypertension.  Sonographer:    Jyl Heinz Referring Phys: [place]  1. Left ventricular ejection fraction, by estimation, is 55 to 60%. The left ventricle has normal function. The left ventricle has no regional wall motion abnormalities. There is mild left ventricular hypertrophy of the basal-septal segment. Left ventricular diastolic parameters are consistent with Grade I diastolic dysfunction (impaired relaxation).  2. Right ventricular systolic function is normal. The right ventricular size is normal. Tricuspid regurgitation signal is inadequate for assessing PA pressure.  3. The mitral valve is degenerative. Mild mitral valve regurgitation. No evidence of mitral stenosis.  4. The aortic valve is calcified. There is severe calcifcation of the aortic valve. There is severe thickening of the aortic valve. Aortic valve regurgitation is trivial. Aortic valve sclerosis/calcification is present, without any evidence of aortic stenosis. Aortic regurgitation PHT measures 626 msec. Aortic valve Vmax measures 1.29 m/s.  5. The inferior  vena cava is normal in size with greater than 50% respiratory variability, suggesting right atrial pressure of 3 mmHg. FINDINGS  Left Ventricle: Left ventricular ejection fraction, by estimation, is 55 to 60%. The left ventricle has normal function. The left ventricle has no regional wall motion abnormalities. The left ventricular internal cavity size was normal in size. There is  mild left ventricular hypertrophy of the basal-septal segment. Left ventricular diastolic parameters are consistent with Grade I diastolic dysfunction (impaired relaxation). Normal left ventricular filling pressure. Right Ventricle: The right ventricular size is normal. No increase in right ventricular wall thickness. Right ventricular systolic function is normal. Tricuspid regurgitation signal is inadequate for assessing PA pressure. Left Atrium: Left atrial size was normal in size. Right Atrium: Right atrial size was normal in size. Pericardium: There is no evidence of pericardial effusion. Mitral Valve: The mitral valve is degenerative in appearance. There is mild thickening of the mitral valve leaflet(s). Mild mitral annular calcification. Mild mitral valve regurgitation. No evidence of mitral valve stenosis. Tricuspid Valve: The tricuspid valve is normal in structure. Tricuspid valve regurgitation is not demonstrated. No evidence of tricuspid stenosis. Aortic Valve: The aortic valve is calcified. There is severe calcifcation of the aortic valve. There is severe thickening of the aortic valve. Aortic valve regurgitation is trivial. Aortic regurgitation PHT measures 626 msec. Aortic valve sclerosis/calcification is present, without any evidence of aortic stenosis. Aortic valve peak gradient measures 6.7 mmHg. Pulmonic Valve: The pulmonic valve was normal in structure. Pulmonic valve regurgitation is trivial. No evidence of pulmonic stenosis. Aorta: The aortic root is normal in size and structure. Venous: The inferior vena cava is normal  in size with greater than 50% respiratory variability, suggesting right atrial pressure of 3 mmHg. IAS/Shunts: No atrial level shunt detected by color flow Doppler.  LEFT VENTRICLE PLAX 2D LVIDd:         4.20 cm     Diastology LVIDs:         3.10 cm     LV e' medial:    4.24 cm/s LV PW:         0.90 cm     LV E/e' medial:  10.4 LV IVS:        1.20 cm     LV e' lateral:   5.55 cm/s LVOT diam:     2.00 cm     LV E/e' lateral: 7.9 LV SV:         52 LV SV Index:   36 LVOT Area:     3.14 cm  LV Volumes (MOD) LV vol d, MOD A2C: 65.9 ml LV vol d, MOD A4C: 70.4 ml LV vol s, MOD A2C: 27.7 ml LV vol s, MOD A4C: 29.9 ml LV SV MOD A2C:     38.2 ml LV SV MOD A4C:     70.4 ml LV SV MOD BP:      39.0 ml RIGHT VENTRICLE            IVC RV Basal diam:  1.90 cm    IVC diam: 1.70 cm RV Mid diam:    1.80 cm RV S prime:     8.59 cm/s TAPSE (M-mode): 1.4 cm LEFT ATRIUM             Index        RIGHT ATRIUM          Index LA diam:        3.40 cm 2.31 cm/m   RA Area:     8.53 cm LA Vol (A2C):   47.5 ml 32.27 ml/m  RA Volume:   12.10 ml 8.22 ml/m LA Vol (A4C):   41.2 ml 27.99 ml/m LA Biplane Vol: 44.7 ml 30.37 ml/m  AORTIC VALVE AV Area (Vmax): 1.81 cm AV Vmax:        129.00 cm/s AV Peak Grad:   6.7 mmHg LVOT Vmax:      74.40 cm/s LVOT Vmean:     49.200 cm/s LVOT VTI:       0.167 m AI PHT:         626 msec  AORTA Ao Root diam: 3.00 cm Ao Asc diam:  3.30 cm MITRAL VALVE MV Area (PHT): 4.39 cm    SHUNTS MV Decel Time: 173 msec    Systemic VTI:  0.17 m MV E velocity: 44.10 cm/s  Systemic Diam: 2.00 cm MV A velocity: 86.00 cm/s MV E/A ratio:  0.51 Fransico Him MD Electronically signed by Fransico Him MD Signature Date/Time: 06/03/2022/12:31:21 PM    Final    CT Angio Chest Pulmonary Embolism (PE) W or WO Contrast  Result Date: 06/03/2022 CLINICAL DATA:  Syncope. EXAM: CT ANGIOGRAPHY CHEST WITH CONTRAST TECHNIQUE: Multidetector CT imaging of the chest was performed using the standard protocol during bolus administration of intravenous  contrast. Multiplanar CT image reconstructions and MIPs were obtained to evaluate the vascular anatomy. RADIATION DOSE REDUCTION: This exam was performed according to the departmental dose-optimization program which includes automated exposure control, adjustment of the mA and/or kV according to patient size and/or use of iterative reconstruction technique. CONTRAST:  39mL OMNIPAQUE IOHEXOL 350 MG/ML SOLN COMPARISON:  None Available. FINDINGS: Cardiovascular: Satisfactory opacification of the pulmonary arteries to the segmental level. No evidence of pulmonary embolism. Normal heart size. No pericardial effusion. Thoracic aortic atherosclerosis. Coronary artery atherosclerosis. Mediastinum/Nodes: No enlarged mediastinal, hilar, or axillary lymph nodes. Thyroid gland, trachea, and esophagus demonstrate no significant findings. Lungs/Pleura: Bilateral chronic interstitial thickening. No pleural effusion or pneumothorax. No focal consolidation. Upper Abdomen: No acute abnormality. Musculoskeletal: No acute osseous abnormality. No aggressive osseous lesion. Generalized osteopenia. Review of the MIP images confirms the above findings. IMPRESSION: 1. No pulmonary embolism. 2. Mild chronic interstitial lung disease. 3. Aortic Atherosclerosis (ICD10-I70.0). 4. Multi vessel coronary artery atherosclerosis. Electronically Signed   By: Kathreen Devoid M.D.   On: 06/03/2022 10:35   X-ray chest PA and lateral  Result Date: 06/02/2022 CLINICAL DATA:  Syncope today. EXAM: CHEST - 2 VIEW COMPARISON:  10/20/2021 FINDINGS: The cardiomediastinal contours are stable. Coronary artery calcifications. Aortic  atherosclerosis. Chronic biapical pleuroparenchymal opacity. Chronic interstitial coarsening. Pulmonary vasculature is normal. No consolidation, pleural effusion, or pneumothorax. No acute osseous abnormalities are seen. Chronic shoulder arthropathy. IMPRESSION: No acute abnormality. Stable radiographic appearance of chronic lung  disease Electronically Signed   By: Keith Rake M.D.   On: 06/02/2022 19:12   CT Head Wo Contrast  Result Date: 06/02/2022 CLINICAL DATA:  Syncope EXAM: CT HEAD WITHOUT CONTRAST TECHNIQUE: Contiguous axial images were obtained from the base of the skull through the vertex without intravenous contrast. RADIATION DOSE REDUCTION: This exam was performed according to the departmental dose-optimization program which includes automated exposure control, adjustment of the mA and/or kV according to patient size and/or use of iterative reconstruction technique. COMPARISON:  Head CT dated Apr 03, 2020 FINDINGS: Brain: Chronic white matter ischemic change and generalized volume loss. No evidence of acute infarction, hemorrhage, hydrocephalus, extra-axial collection or mass lesion/mass effect. Vascular: No hyperdense vessel or unexpected calcification. Skull: Normal. Negative for fracture or focal lesion. Sinuses/Orbits: No acute finding. Other: None. IMPRESSION: No acute intracranial abnormality. Electronically Signed   By: Yetta Glassman M.D.   On: 06/02/2022 15:17    Scheduled Meds:  aspirin EC  81 mg Oral Daily   atenolol  50 mg Oral Daily   cholecalciferol  1,000 Units Oral Daily   clopidogrel  75 mg Oral Daily   enoxaparin (LOVENOX) injection  30 mg Subcutaneous Q24H   ezetimibe  10 mg Oral Daily   famotidine  20 mg Oral Daily   [START ON 06/05/2022] isosorbide mononitrate  60 mg Oral Daily   losartan  25 mg Oral Daily   midodrine  5 mg Oral TID WC   sodium chloride flush  3 mL Intravenous Q12H   Continuous Infusions:  sodium chloride       LOS: 0 days   Darliss Cheney, MD [facility]  06/04/2022, 12:20 PM   *Please note that this is a verbal dictation therefore any spelling or grammatical errors are due to the "[place]" system interpretation.  Please page via [place] and do not message via secure chat for urgent patient care matters. Secure chat can be used for non  urgent patient care matters.  How to contact the [facility] Attending or Consulting provider [place] or covering provider during after hours [place], for this patient?  Check the care team in [facility] and look for a) attending/consulting [facility] provider listed and b) the [facility] team listed. Page or secure chat [phone]. Log into [email or web address] and use [place]'s universal password to access. If you do not have the password, please contact the hospital operator. Locate the [facility] provider you are looking for under [facility] and page to a number that you can be directly reached. If you still have difficulty reaching the provider, please page the [facility] (Director on Call) for the Hospitalists listed on amion for assistance.

## 2022-06-05 ENCOUNTER — Telehealth: Payer: Self-pay | Admitting: Cardiovascular Disease

## 2022-06-05 DIAGNOSIS — R55 Syncope and collapse: Secondary | ICD-10-CM | POA: Diagnosis not present

## 2022-06-05 LAB — GLUCOSE, CAPILLARY: Glucose-Capillary: 102 mg/dL — ABNORMAL HIGH (ref 70–99)

## 2022-06-05 MED ORDER — MIDODRINE HCL 5 MG PO TABS
10.0000 mg | ORAL_TABLET | Freq: Three times a day (TID) | ORAL | Status: DC
  Administered 2022-06-05 – 2022-06-06 (×5): 10 mg via ORAL
  Filled 2022-06-05 (×5): qty 2

## 2022-06-05 MED ORDER — SODIUM CHLORIDE 0.9 % IV SOLN: INTRAVENOUS | Status: DC

## 2022-06-05 MED ORDER — ATENOLOL 25 MG PO TABS
25.0000 mg | ORAL_TABLET | Freq: Every day | ORAL | Status: DC
  Administered 2022-06-05 – 2022-06-06 (×2): 25 mg via ORAL
  Filled 2022-06-05 (×2): qty 1

## 2022-06-05 NOTE — Progress Notes (Signed)
Mobility Specialist Progress Note    06/05/22 1004  Mobility  Activity Ambulated with assistance in hallway  Level of Assistance Contact guard assist, steadying assist  Assistive Device Cane  Distance Ambulated (ft) 340 ft  Activity Response Tolerated well  $Mobility charge 1 Mobility   Pt received sitting EOB and agreeable. Had void in BR. No complaints. Returned to sitting EOB with call bell in reach.   [place] Mobility Specialist

## 2022-06-05 NOTE — Progress Notes (Signed)
PROGRESS NOTE    [name]  FMB:[id number] DOB: Feb 01, 1928 DOA: 06/02/2022 PCP: [name], NP   Brief Narrative:  HPI: [name] is a 88 y.o. female with medical history significant of hypertension, CAD status post multiple stents, GERD, hypertension, vitamin D deficiency presented to the ED after syncopal episode.  Patient and daughter gave the history and stated that this morning patient took her blood pressure medications was in the car with her daughter and daughter stopped at the bank.  Daughter went into the bank and when she came back out it was noted that patient's eyes were closed, mouth open some occasional yawning with unresponsiveness.  After 5 minutes daughter called 911 on route to the hospital stopped on the side of the street and EMS arrived.  Patient does endorse some dizziness prior to syncopal episode.  After the patient was placed in the EMS truck responsiveness improved and patient back to baseline.  Patient denies any fever, no chills, no chest pain, no shortness of breath, no nausea, no vomiting, no abdominal pain, no diarrhea, no constipation, no hematemesis, no hematochezia, no melena.  Patient denies any tongue biting or urinary incontinence.  Patient with no recent surgeries, no recent long car rides.  Patient with no prior history of DVTs.   ED Course: Patient seen in the ED noted to have soft blood pressure with systolics in the 90s to low 100s.  Basic metabolic profile obtained with a sodium of 134, chloride of 97, glucose of 113, calcium of 8.8 otherwise was within normal limits.  CBC obtained with a hemoglobin of 11.9 otherwise within normal limits.  Blood glucose noted at 113.  High-sensitivity troponin negative.  CT head done with no acute intracranial abnormalities.  Orthostatics checked in the ED noted to be positive.  Hospitalist were called to admit the patient for further evaluation and management.  Assessment & Plan:   Principal  Problem:   Syncope Active Problems:   Orthostatic hypotension   Hyperlipidemia   GERD (gastroesophageal reflux disease)   Vitamin D deficiency   Essential hypertension   CAD (coronary artery disease)  syncopal episode/orthostatic hypotension: Syncopal episode likely due to orthostatic hypotension which was evidenced at the time of admission.  CT head negative.  Doppler carotid unremarkable.  Echo shows normal ejection fraction with grade 1 diastolic dysfunction but no significant valvular defect.  Patient is still orthostatic positive based on vitals but she is asymptomatic.  I started her on midodrine yesterday at 5 mg p.o. 3 times daily and reduced her Imdur from 90mg  to 60 mg daily.  She is still orthostatic but numbers are improving now, we will continue IV fluids, I will increase her midodrine to 10 mg p.o. 3 times daily, continue to monitor orthostatics every 8 hours.  Potential discharge tomorrow.  GERD -Pepcid.  Hypertension: Elevated.  See details above.  CAD: Asymptomatic. -Continue home regimen aspirin, Plavix.  Hyperlipidemia -Continue Zetia.  7Vitamin D deficiency -Resume oral vitamin D supplementation.  Hypokalemia: Resolved  DVT prophylaxis: enoxaparin (LOVENOX) injection 30 mg Start: 06/04/22 2000 TED hose Start: 06/02/22 1806   Code Status: Full Code  Family Communication:  None present at bedside.  Plan of care discussed with patient in length and he/she verbalized understanding and agreed with it.  Left a voicemail to the daughter yesterday, have not received a call back yet.  We will try her again later today.  Status is: Inpatient Remains inpatient appropriate because: Still positive orthostatics  Estimated body mass index is 25.28 kg/m as calculated from the following:   Height as of this encounter: 4\' 9"  (1.448 m).   Weight as of this encounter: 53 kg.    Nutritional Assessment: Body mass index is 25.28 kg/m. Seen by dietician.  I agree with the  assessment and plan as outlined below: Nutrition Status:        . Skin Assessment: I have examined the patient's skin and I agree with the wound assessment as performed by the wound care RN as outlined below:    Consultants:  None  Procedures:  None  Antimicrobials:  Anti-infectives (From admission, onward)    None         Subjective:  Patient seen and examined.  She says that she is feeling better and she is eager to go home however she understands that with her significant drop in systolic blood pressure/orthostatic positive, she is high fall risk and she is willing to stay in the hospital at least another night.  Objective: Vitals:   06/05/22 0545 06/05/22 1043 06/05/22 1045 06/05/22 1047  BP: (!) 188/78 (!) 163/75 (!) 153/77 121/68  Pulse: 60     Resp: 18     Temp: 98 F (36.7 C)     TempSrc: Oral     SpO2:      Weight:      Height:        Intake/Output Summary (Last 24 hours) at 06/05/2022 1405 Last data filed at 06/05/2022 0900 Gross per 24 hour  Intake 1215 ml  Output 200 ml  Net 1015 ml    Filed Weights   06/03/22 0927 06/04/22 0330 06/05/22 0500  Weight: 56.4 kg 55.3 kg 53 kg    Examination:  General exam:, Very pleasant, appears calm and comfortable  Respiratory system: Clear to auscultation. Respiratory effort normal. Cardiovascular system: S1 & S2 heard, RRR. No JVD, murmurs, rubs, gallops or clicks. No pedal edema. Gastrointestinal system: Abdomen is nondistended, soft and nontender. No organomegaly or masses felt. Normal bowel sounds heard. Central nervous system: Alert and oriented. No focal neurological deficits. Extremities: Symmetric 5 x 5 power. Skin: No rashes, lesions or ulcers.  Psychiatry: Judgement and insight appear normal. Mood & affect appropriate.    Data Reviewed: I have personally reviewed following labs and imaging studies  CBC: Recent Labs  Lab 06/02/22 1257 06/03/22 0338  WBC 6.5 5.8  HGB 11.9* 10.6*  HCT  35.7* 32.3*  MCV 94.2 95.3  PLT 192 163    Basic Metabolic Panel: Recent Labs  Lab 06/02/22 1500 06/02/22 1930 06/03/22 0338 06/04/22 0845  NA 134*  --  132* 133*  K 3.7  --  3.3* 4.3  CL 97*  --  101 93*  CO2 29  --  26 29  GLUCOSE 113*  --  97 111*  BUN 13  --  14 15  CREATININE 0.79  --  0.82 0.94  CALCIUM 8.8*  --  8.4* 9.8  MG  --  1.7  --   --     GFR: Estimated Creatinine Clearance: 26.8 mL/min (by C-G formula based on SCr of 0.94 mg/dL). Liver Function Tests: Recent Labs  Lab 06/03/22 0338  AST 17  ALT 9  ALKPHOS 45  BILITOT 0.7  PROT 5.8*  ALBUMIN 2.8*    No results for input(s): "LIPASE", "AMYLASE" in the last 168 hours. No results for input(s): "AMMONIA" in the last 168 hours. Coagulation Profile: No results for input(s): "INR", "PROTIME"  in the last 168 hours. Cardiac Enzymes: No results for input(s): "CKTOTAL", "CKMB", "CKMBINDEX", "TROPONINI" in the last 168 hours. BNP (last 3 results) No results for input(s): "PROBNP" in the last 8760 hours. HbA1C: No results for input(s): "HGBA1C" in the last 72 hours. CBG: Recent Labs  Lab 06/02/22 1526 06/04/22 0724 06/05/22 0545  GLUCAP 117* 98 102*    Lipid Profile: No results for input(s): "CHOL", "HDL", "LDLCALC", "TRIG", "CHOLHDL", "LDLDIRECT" in the last 72 hours. Thyroid Function Tests: No results for input(s): "TSH", "T4TOTAL", "FREET4", "T3FREE", "THYROIDAB" in the last 72 hours. Anemia Panel: No results for input(s): "VITAMINB12", "FOLATE", "FERRITIN", "TIBC", "IRON", "RETICCTPCT" in the last 72 hours. Sepsis Labs: No results for input(s): "PROCALCITON", "LATICACIDVEN" in the last 168 hours.  No results found for this or any previous visit (from the past 240 hour(s)).   Radiology Studies: No results found.  Scheduled Meds:  aspirin EC  81 mg Oral Daily   atenolol  25 mg Oral Daily   cholecalciferol  1,000 Units Oral Daily   clopidogrel  75 mg Oral Daily   enoxaparin (LOVENOX)  injection  30 mg Subcutaneous Q24H   ezetimibe  10 mg Oral Daily   famotidine  20 mg Oral Daily   isosorbide mononitrate  60 mg Oral Daily   losartan  25 mg Oral Daily   midodrine  10 mg Oral TID WC   sodium chloride flush  3 mL Intravenous Q12H   Continuous Infusions:  sodium chloride 75 mL/hr at 06/05/22 1145     LOS: 1 day   Hughie Closs, MD [facility]  06/05/2022, 2:05 PM   *Please note that this is a verbal dictation therefore any spelling or grammatical errors are due to the "[employer] One" system interpretation.  Please page via Amion and do not message via secure chat for urgent patient care matters. Secure chat can be used for non urgent patient care matters.  How to contact the [facility] Attending or Consulting provider 7A - 7P or covering provider during after hours 7P -7A, for this patient?  Check the care team in [facility] and look for a) attending/consulting [facility] provider listed and b) the [facility] team listed. Page or secure chat 7A-7P. Log into [email or web address] and use [place]'s universal password to access. If you do not have the password, please contact the hospital operator. Locate the [facility] provider you are looking for under [facility] and page to a number that you can be directly reached. If you still have difficulty reaching the provider, please page the [facility] (Director on Call) for the Hospitalists listed on amion for assistance.

## 2022-06-05 NOTE — Telephone Encounter (Signed)
Pt daughter called stating pt is in the hospital and wanted to let Dr. Elease Hashimoto know.

## 2022-06-06 DIAGNOSIS — E559 Vitamin D deficiency, unspecified: Secondary | ICD-10-CM | POA: Diagnosis not present

## 2022-06-06 DIAGNOSIS — E782 Mixed hyperlipidemia: Secondary | ICD-10-CM | POA: Diagnosis not present

## 2022-06-06 DIAGNOSIS — I10 Essential (primary) hypertension: Secondary | ICD-10-CM | POA: Diagnosis not present

## 2022-06-06 DIAGNOSIS — R55 Syncope and collapse: Secondary | ICD-10-CM | POA: Diagnosis not present

## 2022-06-06 LAB — GLUCOSE, CAPILLARY: Glucose-Capillary: 100 mg/dL — ABNORMAL HIGH (ref 70–99)

## 2022-06-06 MED ORDER — MIDODRINE HCL 10 MG PO TABS: 10.0000 mg | ORAL_TABLET | Freq: Three times a day (TID) | ORAL | 0 refills | Status: DC

## 2022-06-06 MED ORDER — ISOSORBIDE MONONITRATE ER 60 MG PO TB24: 60.0000 mg | ORAL_TABLET | Freq: Every day | ORAL | Status: DC

## 2022-06-06 MED ORDER — LOSARTAN POTASSIUM 25 MG PO TABS: 12.5000 mg | ORAL_TABLET | Freq: Every day | ORAL | Status: DC

## 2022-06-06 NOTE — Discharge Summary (Signed)
Physician Discharge Summary  [name] ZOX:[id number] DOB: [birth year] DOA: 06/02/2022  PCP: [name], NP  Admit date: 06/02/2022 Discharge date: 06/06/2022  Admitted From: home Discharge disposition: home   Recommendations for Outpatient Follow-Up:   Home health Monitor orthostatics BMP 1 week TED hose/abdominal binder as able   Discharge Diagnosis:   Principal Problem:   Syncope Active Problems:   Orthostatic hypotension   Hyperlipidemia   GERD (gastroesophageal reflux disease)   Vitamin D deficiency   Essential hypertension   CAD (coronary artery disease)    Discharge Condition: Improved.  Diet recommendation: Regular.  Wound care: None.  Code status: Full.   History of Present Illness:   [name] is a 88 y.o. female with medical history significant of hypertension, CAD status post multiple stents, GERD, hypertension, vitamin D deficiency presented to the ED after syncopal episode.  Patient and daughter gave the history and stated that this morning patient took her blood pressure medications was in the car with her daughter and daughter stopped at the bank.  Daughter went into the bank and when she came back out it was noted that patient's eyes were closed, mouth open some occasional yawning with unresponsiveness.  After 5 minutes daughter called 911 on route to the hospital stopped on the side of the street and EMS arrived.  Patient does endorse some dizziness prior to syncopal episode.  After the patient was placed in the EMS truck responsiveness improved and patient back to baseline.  Patient denies any fever, no chills, no chest pain, no shortness of breath, no nausea, no vomiting, no abdominal pain, no diarrhea, no constipation, no hematemesis, no hematochezia, no melena.  Patient denies any tongue biting or urinary incontinence.  Patient with no recent surgeries, no recent long car rides.  Patient with no prior history of DVTs.      Hospital Course by Problem:   syncopal episode/orthostatic hypotension: Syncopal episode likely due to orthostatic hypotension which was evidenced at the time of admission.  CT head negative.  Doppler carotid unremarkable.  Echo shows normal ejection fraction with grade 1 diastolic dysfunction but no significant valvular defect.  Patient is still orthostatic positive based on vitals but she is asymptomatic.   -started on midodrine  -improved on day of discharge-- asymptomatic-- will need to continue orthostatic precuations   GERD -Pepcid.   Hypertension: Elevated.  See details above.   CAD: Asymptomatic. -Continue home regimen aspirin, Plavix.   Hyperlipidemia -Continue Zetia.   Vitamin D deficiency -Resume oral vitamin D supplementation.   Hypokalemia: Resolved    Medical Consultants:      Discharge Exam:   Vitals:   06/06/22 0526 06/06/22 0810  BP: (!) 140/98 (!) 114/59  Pulse: 72 64  Resp: 16 18  Temp: 98.9 F (37.2 C) 98.4 F (36.9 C)  SpO2: 95% 95%   Vitals:   06/05/22 1702 06/05/22 1703 06/06/22 0526 06/06/22 0810  BP: (!) 167/88 (!) 161/74 (!) 140/98 (!) 114/59  Pulse: 60  72 64  Resp:   16 18  Temp:   98.9 F (37.2 C) 98.4 F (36.9 C)  TempSrc:   Oral Oral  SpO2:   95% 95%  Weight:   55.5 kg   Height:        General exam: Appears calm and comfortable.     The results of significant diagnostics from this hospitalization (including imaging, microbiology, ancillary and laboratory) are listed below for reference.  Procedures and Diagnostic Studies:   VAS US CAROTID  Result Date: 06/03/2022 Carotid Arterial Duplex Study Patient Name:  [name]  Date of Exam:   06/03/2022 Medical Rec #: [id number]          Accession #:    [id number] Date of Birth: Jul 26, 1928           Patient Gender: F Patient Age:   13 years Exam Location:  [facility] Procedure:      VAS US CAROTID Referring Phys: Ramiro Harvest  --------------------------------------------------------------------------------  Indications:       Syncope. Risk Factors:      Hypertension, hyperlipidemia, coronary artery disease. Comparison Study:  Previous exam 01/12/20, Bilateral 1-39% stenosis. Performing Technologist: Magdalene River BS, RVT  Examination Guidelines: A complete evaluation includes B-mode imaging, spectral Doppler, color Doppler, and power Doppler as needed of all accessible portions of each vessel. Bilateral testing is considered an integral part of a complete examination. Limited examinations for reoccurring indications may be performed as noted.  Right Carotid Findings: +----------+--------+--------+--------+------------------+--------+           PSV cm/sEDV cm/sStenosisPlaque DescriptionComments +----------+--------+--------+--------+------------------+--------+ CCA Prox  70      14              heterogenous               +----------+--------+--------+--------+------------------+--------+ CCA Distal52      13              heterogenous               +----------+--------+--------+--------+------------------+--------+ ICA Prox  41      12              calcific                   +----------+--------+--------+--------+------------------+--------+ ICA Distal44      11                                         +----------+--------+--------+--------+------------------+--------+ ECA       42      0               heterogenous               +----------+--------+--------+--------+------------------+--------+ +----------+--------+-------+----------------+-------------------+           PSV cm/sEDV cmsDescribe        Arm Pressure (mmHG) +----------+--------+-------+----------------+-------------------+ [id number]             Multiphasic, WNL                    +----------+--------+-------+----------------+-------------------+  +---------+--------+--+--------+--+--------------------------------------------+ VertebralPSV cm/s85EDV cm/s18Antegrade and Increased velocity compared to                              adjacent segment.                            +---------+--------+--+--------+--+--------------------------------------------+  Left Carotid Findings: +----------+--------+--------+--------+------------------+--------+           PSV cm/sEDV cm/sStenosisPlaque DescriptionComments +----------+--------+--------+--------+------------------+--------+ CCA Prox  63      13              heterogenous               +----------+--------+--------+--------+------------------+--------+ CCA Distal78  15              heterogenous               +----------+--------+--------+--------+------------------+--------+ ICA Prox  48      8               heterogenous      tortuous +----------+--------+--------+--------+------------------+--------+ ICA Distal52      13                                tortuous +----------+--------+--------+--------+------------------+--------+ ECA       50      4               heterogenous               +----------+--------+--------+--------+------------------+--------+ +----------+--------+--------+----------------+-------------------+           PSV cm/sEDV cm/sDescribe        Arm Pressure (mmHG) +----------+--------+--------+----------------+-------------------+ [id number]             Multiphasic, WNL                    +----------+--------+--------+----------------+-------------------+ +---------+--------+--+--------+-+---------+ VertebralPSV cm/s21EDV cm/s5Antegrade +---------+--------+--+--------+-+---------+   Summary: Right Carotid: Velocities in the right ICA are consistent with a 1-39% stenosis. Left Carotid: Velocities in the left ICA are consistent with a 1-39% stenosis. Vertebrals:  Bilateral vertebral arteries demonstrate antegrade flow.  Subclavians: Normal flow hemodynamics were seen in bilateral subclavian              arteries. *See table(s) above for measurements and observations.  Electronically signed by Coral ElseVance Brabham MD on 06/03/2022 at 9:56:46 PM.    Final    ECHOCARDIOGRAM COMPLETE  Result Date: 06/03/2022    ECHOCARDIOGRAM REPORT   Patient Name:   Bobby RumpfNNIE E Verma Date of Exam: 06/03/2022 Medical Rec #:  [id number]         Height:       57.0 in Accession #:    [id number]        Weight:       124.8 lb Date of Birth:  06/11/1928          BSA:          1.472 m Patient Age:    89 years          BP:           152/62 mmHg Patient Gender: F                 HR:           62 bpm. Exam Location:  Inpatient Procedure: 2D Echo, Cardiac Doppler and Color Doppler Indications:    Syncope  History:        Patient has no prior history of Echocardiogram examinations.                 CAD; Risk Factors:Hypertension.  Sonographer:    Cleatis Polkaaylor Peper Referring Phys: [id number] DANIEL V THOMPSON IMPRESSIONS  1. Left ventricular ejection fraction, by estimation, is 55 to 60%. The left ventricle has normal function. The left ventricle has no regional wall motion abnormalities. There is mild left ventricular hypertrophy of the basal-septal segment. Left ventricular diastolic parameters are consistent with Grade I diastolic dysfunction (impaired relaxation).  2. Right ventricular systolic function is normal. The right ventricular size is normal. Tricuspid regurgitation signal is inadequate for assessing PA pressure.  3. The mitral valve is  degenerative. Mild mitral valve regurgitation. No evidence of mitral stenosis.  4. The aortic valve is calcified. There is severe calcifcation of the aortic valve. There is severe thickening of the aortic valve. Aortic valve regurgitation is trivial. Aortic valve sclerosis/calcification is present, without any evidence of aortic stenosis. Aortic regurgitation PHT measures 626 msec. Aortic valve Vmax measures 1.29 m/s.  5. The inferior  vena cava is normal in size with greater than 50% respiratory variability, suggesting right atrial pressure of 3 mmHg. FINDINGS  Left Ventricle: Left ventricular ejection fraction, by estimation, is 55 to 60%. The left ventricle has normal function. The left ventricle has no regional wall motion abnormalities. The left ventricular internal cavity size was normal in size. There is  mild left ventricular hypertrophy of the basal-septal segment. Left ventricular diastolic parameters are consistent with Grade I diastolic dysfunction (impaired relaxation). Normal left ventricular filling pressure. Right Ventricle: The right ventricular size is normal. No increase in right ventricular wall thickness. Right ventricular systolic function is normal. Tricuspid regurgitation signal is inadequate for assessing PA pressure. Left Atrium: Left atrial size was normal in size. Right Atrium: Right atrial size was normal in size. Pericardium: There is no evidence of pericardial effusion. Mitral Valve: The mitral valve is degenerative in appearance. There is mild thickening of the mitral valve leaflet(s). Mild mitral annular calcification. Mild mitral valve regurgitation. No evidence of mitral valve stenosis. Tricuspid Valve: The tricuspid valve is normal in structure. Tricuspid valve regurgitation is not demonstrated. No evidence of tricuspid stenosis. Aortic Valve: The aortic valve is calcified. There is severe calcifcation of the aortic valve. There is severe thickening of the aortic valve. Aortic valve regurgitation is trivial. Aortic regurgitation PHT measures 626 msec. Aortic valve sclerosis/calcification is present, without any evidence of aortic stenosis. Aortic valve peak gradient measures 6.7 mmHg. Pulmonic Valve: The pulmonic valve was normal in structure. Pulmonic valve regurgitation is trivial. No evidence of pulmonic stenosis. Aorta: The aortic root is normal in size and structure. Venous: The inferior vena cava is normal  in size with greater than 50% respiratory variability, suggesting right atrial pressure of 3 mmHg. IAS/Shunts: No atrial level shunt detected by color flow Doppler.  LEFT VENTRICLE PLAX 2D LVIDd:         4.20 cm     Diastology LVIDs:         3.10 cm     LV e' medial:    4.24 cm/s LV PW:         0.90 cm     LV E/e' medial:  10.4 LV IVS:        1.20 cm     LV e' lateral:   5.55 cm/s LVOT diam:     2.00 cm     LV E/e' lateral: 7.9 LV SV:         52 LV SV Index:   36 LVOT Area:     3.14 cm  LV Volumes (MOD) LV vol d, MOD A2C: 65.9 ml LV vol d, MOD A4C: 70.4 ml LV vol s, MOD A2C: 27.7 ml LV vol s, MOD A4C: 29.9 ml LV SV MOD A2C:     38.2 ml LV SV MOD A4C:     70.4 ml LV SV MOD BP:      39.0 ml RIGHT VENTRICLE            IVC RV Basal diam:  1.90 cm    IVC diam: 1.70 cm RV Mid diam:    1.80  cm RV S prime:     8.59 cm/s TAPSE (M-mode): 1.4 cm LEFT ATRIUM             Index        RIGHT ATRIUM          Index LA diam:        3.40 cm 2.31 cm/m   RA Area:     8.53 cm LA Vol (A2C):   47.5 ml 32.27 ml/m  RA Volume:   12.10 ml 8.22 ml/m LA Vol (A4C):   41.2 ml 27.99 ml/m LA Biplane Vol: 44.7 ml 30.37 ml/m  AORTIC VALVE AV Area (Vmax): 1.81 cm AV Vmax:        129.00 cm/s AV Peak Grad:   6.7 mmHg LVOT Vmax:      74.40 cm/s LVOT Vmean:     49.200 cm/s LVOT VTI:       0.167 m AI PHT:         626 msec  AORTA Ao Root diam: 3.00 cm Ao Asc diam:  3.30 cm MITRAL VALVE MV Area (PHT): 4.39 cm    SHUNTS MV Decel Time: 173 msec    Systemic VTI:  0.17 m MV E velocity: 44.10 cm/s  Systemic Diam: 2.00 cm MV A velocity: 86.00 cm/s MV E/A ratio:  0.51 Armanda Magic MD Electronically signed by Armanda Magic MD Signature Date/Time: 06/03/2022/12:31:21 PM    Final    CT Angio Chest Pulmonary Embolism (PE) W or WO Contrast  Result Date: 06/03/2022 CLINICAL DATA:  Syncope. EXAM: CT ANGIOGRAPHY CHEST WITH CONTRAST TECHNIQUE: Multidetector CT imaging of the chest was performed using the standard protocol during bolus administration of intravenous  contrast. Multiplanar CT image reconstructions and MIPs were obtained to evaluate the vascular anatomy. RADIATION DOSE REDUCTION: This exam was performed according to the departmental dose-optimization program which includes automated exposure control, adjustment of the mA and/or kV according to patient size and/or use of iterative reconstruction technique. CONTRAST:  55mL OMNIPAQUE IOHEXOL 350 MG/ML SOLN COMPARISON:  None Available. FINDINGS: Cardiovascular: Satisfactory opacification of the pulmonary arteries to the segmental level. No evidence of pulmonary embolism. Normal heart size. No pericardial effusion. Thoracic aortic atherosclerosis. Coronary artery atherosclerosis. Mediastinum/Nodes: No enlarged mediastinal, hilar, or axillary lymph nodes. Thyroid gland, trachea, and esophagus demonstrate no significant findings. Lungs/Pleura: Bilateral chronic interstitial thickening. No pleural effusion or pneumothorax. No focal consolidation. Upper Abdomen: No acute abnormality. Musculoskeletal: No acute osseous abnormality. No aggressive osseous lesion. Generalized osteopenia. Review of the MIP images confirms the above findings. IMPRESSION: 1. No pulmonary embolism. 2. Mild chronic interstitial lung disease. 3. Aortic Atherosclerosis (ICD10-I70.0). 4. Multi vessel coronary artery atherosclerosis. Electronically Signed   By: Elige Ko M.D.   On: 06/03/2022 10:35   X-ray chest PA and lateral  Result Date: 06/02/2022 CLINICAL DATA:  Syncope today. EXAM: CHEST - 2 VIEW COMPARISON:  10/20/2021 FINDINGS: The cardiomediastinal contours are stable. Coronary artery calcifications. Aortic atherosclerosis. Chronic biapical pleuroparenchymal opacity. Chronic interstitial coarsening. Pulmonary vasculature is normal. No consolidation, pleural effusion, or pneumothorax. No acute osseous abnormalities are seen. Chronic shoulder arthropathy. IMPRESSION: No acute abnormality. Stable radiographic appearance of chronic lung  disease Electronically Signed   By: Narda Rutherford M.D.   On: 06/02/2022 19:12   CT Head Wo Contrast  Result Date: 06/02/2022 CLINICAL DATA:  Syncope EXAM: CT HEAD WITHOUT CONTRAST TECHNIQUE: Contiguous axial images were obtained from the base of the skull through the vertex without intravenous contrast. RADIATION DOSE REDUCTION: This exam was  performed according to the departmental dose-optimization program which includes automated exposure control, adjustment of the mA and/or kV according to patient size and/or use of iterative reconstruction technique. COMPARISON:  Head CT dated Apr 03, 2020 FINDINGS: Brain: Chronic white matter ischemic change and generalized volume loss. No evidence of acute infarction, hemorrhage, hydrocephalus, extra-axial collection or mass lesion/mass effect. Vascular: No hyperdense vessel or unexpected calcification. Skull: Normal. Negative for fracture or focal lesion. Sinuses/Orbits: No acute finding. Other: None. IMPRESSION: No acute intracranial abnormality. Electronically Signed   By: Allegra Lai M.D.   On: 06/02/2022 15:17     Labs:   Basic Metabolic Panel: Recent Labs  Lab 06/02/22 1500 06/02/22 1930 06/03/22 0338 06/04/22 0845  NA 134*  --  132* 133*  K 3.7  --  3.3* 4.3  CL 97*  --  101 93*  CO2 29  --  26 29  GLUCOSE 113*  --  97 111*  BUN 13  --  14 15  CREATININE 0.79  --  0.82 0.94  CALCIUM 8.8*  --  8.4* 9.8  MG  --  1.7  --   --    GFR Estimated Creatinine Clearance: 27.4 mL/min (by C-G formula based on SCr of 0.94 mg/dL). Liver Function Tests: Recent Labs  Lab 06/03/22 0338  AST 17  ALT 9  ALKPHOS 45  BILITOT 0.7  PROT 5.8*  ALBUMIN 2.8*   No results for input(s): "LIPASE", "AMYLASE" in the last 168 hours. No results for input(s): "AMMONIA" in the last 168 hours. Coagulation profile No results for input(s): "INR", "PROTIME" in the last 168 hours.  CBC: Recent Labs  Lab 06/02/22 1257 06/03/22 0338  WBC 6.5 5.8  HGB  11.9* 10.6*  HCT 35.7* 32.3*  MCV 94.2 95.3  PLT 192 163   Cardiac Enzymes: No results for input(s): "CKTOTAL", "CKMB", "CKMBINDEX", "TROPONINI" in the last 168 hours. BNP: Invalid input(s): "POCBNP" CBG: Recent Labs  Lab 06/02/22 1526 06/04/22 0724 06/05/22 0545 06/06/22 0532  GLUCAP 117* 98 102* 100*   D-Dimer No results for input(s): "DDIMER" in the last 72 hours. Hgb A1c No results for input(s): "HGBA1C" in the last 72 hours. Lipid Profile No results for input(s): "CHOL", "HDL", "LDLCALC", "TRIG", "CHOLHDL", "LDLDIRECT" in the last 72 hours. Thyroid function studies No results for input(s): "TSH", "T4TOTAL", "T3FREE", "THYROIDAB" in the last 72 hours.  Invalid input(s): "FREET3" Anemia work up No results for input(s): "VITAMINB12", "FOLATE", "FERRITIN", "TIBC", "IRON", "RETICCTPCT" in the last 72 hours. Microbiology No results found for this or any previous visit (from the past 240 hour(s)).   Discharge Instructions:   Discharge Instructions     Diet - low sodium heart healthy   Complete by: As directed    Increase activity slowly   Complete by: As directed       Allergies as of 06/06/2022       Reactions   Codeine Cough   Crestor [rosuvastatin Calcium] Cough   Lisinopril Cough   Pantoprazole Sodium Cough   Pravastatin Cough   Simvastatin Cough        Medication List     STOP taking these medications    hydrochlorothiazide 25 MG tablet Commonly known as: HYDRODIURIL       TAKE these medications    albuterol 108 (90 Base) MCG/ACT inhaler Commonly known as: VENTOLIN HFA Inhale 1-2 puffs into the lungs every 6 (six) hours as needed for wheezing or shortness of breath.   aspirin EC 81 MG tablet  Take 81 mg by mouth daily.   atenolol 25 MG tablet Commonly known as: TENORMIN Take 2 tablets (50 mg total) by mouth daily.   cholecalciferol 25 MCG (1000 UNIT) tablet Commonly known as: VITAMIN D3 Take 1,000 Units by mouth daily.    clopidogrel 75 MG tablet Commonly known as: PLAVIX TAKE 1 TABLET BY MOUTH  DAILY WITH BREAKFAST What changed: when to take this   ezetimibe 10 MG tablet Commonly known as: ZETIA Take 1 tablet (10 mg total) by mouth daily.   isosorbide mononitrate 60 MG 24 hr tablet Commonly known as: IMDUR Take 1 tablet (60 mg total) by mouth daily.   losartan 25 MG tablet Commonly known as: COZAAR Take 0.5 tablets (12.5 mg total) by mouth daily. What changed: how much to take   midodrine 10 MG tablet Commonly known as: PROAMATINE Take 1 tablet (10 mg total) by mouth 3 (three) times daily with meals.   nitroGLYCERIN 0.4 MG SL tablet Commonly known as: NITROSTAT DISSOLVE 1 TABLET UNDER THE TONGUE EVERY 5 MINUTES AS  NEEDED FOR CHEST PAIN. MAX  OF 3 TABLETS IN 15 MINUTES. CALL 911 IF PAIN PERSISTS. What changed: See the new instructions.   PRESERVISION AREDS 2+MULTI VIT PO Take 1 capsule by mouth in the morning and at bedtime.   Repatha SureClick 140 MG/ML Soaj Generic drug: Evolocumab Inject 140 mg into the skin every 14 (fourteen) days.   Tums Ultra 1000 400 MG chewable tablet Generic drug: calcium elemental as carbonate Chew 1,000 mg by mouth daily as needed for heartburn.        Follow-up Information     Nche, Bonna Gains, NP Follow up.   Specialty: Internal Medicine Why: 1 week- BP check Contact information: [address] [place] [address] [phone]         Nahser, Deloris Ping, MD Follow up in 1 week(s).   Specialty: Cardiology Contact information: [address] [place] [address] [phone]                  Time coordinating discharge: 45 min  Signed:  Joseph Art DO  [facility] 06/06/2022, 11:23 AM

## 2022-06-06 NOTE — TOC Transition Note (Signed)
Transition of Care [facility]) - CM/SW Discharge Note   Patient Details  Name: [name] MRN: [id number] Date of Birth: 1928-08-28  [facility]) CM/SW Contact:  Kallie Locks, RN Phone Number: [phone] 06/06/2022, 12:19 PM   Clinical Narrative:  Went to bedside to speak with [name] about home health recommendations. [name] advised Clinical research associate to contact her daughter [name] [phone] to discuss. Spoke with [name] (daughter/DPR) to discuss home health services for PT. [name] does not have preference for agency. States [name]'s 78 year old great granddaughter and family live with her. [name] has rolling walker and cane at home. No further DME needed. [name] provides transportation and the like for [name].   [name] with [facility] able to accept referral for home health PT services. [name] made aware.  No further TOC  needs identified.      Final next level of care: Home w Home Health Services Barriers to Discharge: No Barriers Identified   Patient Goals and CMS Choice Patient states their goals for this hospitalization and ongoing recovery are:: return home CMS Medicare.gov Compare Post Acute Care list provided to:: Other (Comment Required) (daughter- [name]) Choice offered to / list presented to : Adult Children [name]- daughter)  Discharge Placement                       Discharge Plan and Services                          HH Arranged: PT [facility] Agency: [facility] Date [facility] Agency Contacted: 06/06/22 Time HH Agency Contacted: [id number] Representative spoke with at [facility] Agency: [name]  Social Determinants of Health (SDOH) Interventions     Readmission Risk Interventions     No data to display

## 2022-06-08 ENCOUNTER — Telehealth: Payer: Self-pay

## 2022-06-08 NOTE — Telephone Encounter (Signed)
Transition Care Management Unsuccessful Follow-up Telephone Call  Date of discharge and from where:  06/02/22-06/06/22 [name]  Attempts:  1st Attempt  Reason for unsuccessful TCM follow-up call:  Left voice message

## 2022-06-09 ENCOUNTER — Telehealth: Payer: Self-pay

## 2022-06-09 NOTE — Patient Outreach (Signed)
Transition Care Management Follow-up Telephone Call Date of discharge and from where: [facility] 06/02/22-06/06/22 How have you been since you were released from the hospital? Per daughter and patient on speaker phone, she is doing well.  Patient's Granddaughter is a CNA and she has been checking patients blood pressure and it has been good. 130-150 over 70, 80's.  Any questions or concerns? Yes- [name] was wondering if her Mom needs to wear TED stockings for 24 hours.  Patient does not like to wear them, so advised she should wear them when up and moving during the day, especially if she does not want to wear at all.  Items Reviewed: Did the pt receive and understand the discharge instructions provided? Yes  Medications obtained and verified? Yes  Other? No  Any new allergies since your discharge? No  Dietary orders reviewed? Yes Do you have support at home? Yes   Home Care and Equipment/Supplies: Were home health services ordered? yes If so, what is the name of the agency? [facility]  Has the agency set up a time to come to the patient's home? No- PT was set up and they have not heard from [place]. Sent message to [place] at [facility] inquiring on status of therapist. Were any new equipment or medical supplies ordered?  No What is the name of the medical supply agency? N/A Were you able to get the supplies/equipment? not applicable Do you have any questions related to the use of the equipment or supplies? No  Functional Questionnaire: (I = Independent and D = Dependent) ADLs: D  Bathing/Dressing- D  Meal Prep- D  Eating- I  Maintaining continence- D  Transferring/Ambulation- D  Managing Meds- D  Follow up appointments reviewed:  PCP Hospital f/u appt confirmed? No  Message sent to [facility] [place] notifying of need for PT appointment. Specialist Hospital f/u appt confirmed? No  Patients daughter is aware they need to set up appt with cardiologist, Dr. Elease Hashimoto Are  transportation arrangements needed? No  If their condition worsens, is the pt aware to call PCP or go to the Emergency Dept.? Yes Was the patient provided with contact information for the PCP's office or ED? Yes Was to pt encouraged to call back with questions or concerns? Yes

## 2022-06-15 ENCOUNTER — Encounter: Payer: Self-pay | Admitting: Cardiovascular Disease
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
## 2022-06-16 ENCOUNTER — Ambulatory Visit: Payer: Medicare Other | Admitting: Cardiovascular Disease

## 2022-06-16 ENCOUNTER — Encounter: Payer: Self-pay | Admitting: Cardiovascular Disease

## 2022-06-16 VITALS — BP 132/66 | HR 64 | Ht <= 58 in | Wt 121.4 lb

## 2022-06-16 DIAGNOSIS — I2510 Atherosclerotic heart disease of native coronary artery without angina pectoris: Secondary | ICD-10-CM | POA: Diagnosis not present

## 2022-06-16 DIAGNOSIS — E782 Mixed hyperlipidemia: Secondary | ICD-10-CM

## 2022-06-16 DIAGNOSIS — I10 Essential (primary) hypertension: Secondary | ICD-10-CM | POA: Diagnosis not present

## 2022-06-16 NOTE — Patient Instructions (Signed)
Medication Instructions:   Your physician recommends that you continue on your current medications as directed. Please refer to the Current Medication list given to you today.  *If you need a refill on your cardiac medications before your next appointment, please call your pharmacy*  Follow-Up: At [facility], you and your health needs are our priority.  As part of our continuing mission to provide you with exceptional heart care, we have created designated Provider Care Teams.  These Care Teams include your primary Cardiologist (physician) and Advanced Practice Providers (APPs -  Physician Assistants and Nurse Practitioners) who all work together to provide you with the care you need, when you need it.  We recommend signing up for the patient portal called "MyChart".  Sign up information is provided on this After Visit Summary.  MyChart is used to connect with patients for Virtual Visits (Telemedicine).  Patients are able to view lab/test results, encounter notes, upcoming appointments, etc.  Non-urgent messages can be sent to your provider as well.   To learn more about what you can do with MyChart, go to [email or web address].    Your next appointment:   6 month(s)  The format for your next appointment:   In Person  Provider:   Scott Weaver, PA-C     

## 2022-06-28 ENCOUNTER — Other Ambulatory Visit: Payer: Self-pay | Admitting: Cardiovascular Disease

## 2022-06-30 ENCOUNTER — Telehealth: Payer: Self-pay

## 2022-06-30 NOTE — Progress Notes (Signed)
Chronic Care Management Pharmacy Assistant   Name: [name]  MRN: [id number] DOB: December 25, 1927  Reason for Encounter:Hypertension Disease State Call.   Recent office visits:  No recent office Visit  Recent consult visits:  06/16/2022 Dr. Elease Hashimoto MD (Cardiology) No Medication Changes noted  Hospital visits:  Medication Reconciliation was completed by comparing discharge summary, patient's EMR and Pharmacy list, and upon discussion with patient.  Admitted to the hospital on 06/02/2022 due to Syncope. Discharge date was 06/06/2022. Discharged from [facility].    New?Medications Started at [facility] Discharge:?? -started None ID   Medication Changes at Hospital Discharge: -Changed losartan Take 0.5 tablets (12.5 mg total) by mouth daily  Medications Discontinued at Hospital Discharge: -Stopped hydrochlorothiazide  Medications that remain the same after Hospital Discharge:??  -All other medications will remain the same.    Medications: Outpatient Encounter Medications as of 06/30/2022  Medication Sig   albuterol (VENTOLIN HFA) 108 (90 Base) MCG/ACT inhaler Inhale 1-2 puffs into the lungs every 6 (six) hours as needed for wheezing or shortness of breath.   aspirin EC 81 MG tablet Take 81 mg by mouth daily.   atenolol (TENORMIN) 25 MG tablet Take 2 tablets (50 mg total) by mouth daily.   calcium elemental as carbonate (TUMS ULTRA 1000) 400 MG chewable tablet Chew 1,000 mg by mouth daily as needed for heartburn.   cholecalciferol (VITAMIN D3) 25 MCG (1000 UNIT) tablet Take 1,000 Units by mouth daily.   clopidogrel (PLAVIX) 75 MG tablet TAKE 1 TABLET BY MOUTH  DAILY WITH BREAKFAST   Evolocumab (REPATHA SURECLICK) 140 MG/ML SOAJ Inject 140 mg into the skin every 14 (fourteen) days.   ezetimibe (ZETIA) 10 MG tablet Take 1 tablet (10 mg total) by mouth daily. (Patient not taking: Reported on 04/30/2022)   isosorbide mononitrate (IMDUR) 60 MG 24 hr tablet Take 1  tablet (60 mg total) by mouth daily.   losartan (COZAAR) 25 MG tablet Take 0.5 tablets (12.5 mg total) by mouth daily.   midodrine (PROAMATINE) 10 MG tablet Take 1 tablet (10 mg total) by mouth 3 (three) times daily with meals.   Multiple Vitamins-Minerals (PRESERVISION AREDS 2+MULTI VIT PO) Take 1 capsule by mouth in the morning and at bedtime.   nitroGLYCERIN (NITROSTAT) 0.4 MG SL tablet DISSOLVE 1 TABLET UNDER THE TONGUE EVERY 5 MINUTES AS  NEEDED FOR CHEST PAIN. MAX  OF 3 TABLETS IN 15 MINUTES. CALL 911 IF PAIN PERSISTS.   No facility-administered encounter medications on file as of 06/30/2022.    Care Gaps: Covid-19 Vaccine Shingrix Vaccine Tetanus Vaccine Influenza Vaccine   Star Rating Drugs: Losatatn 25 mg last filled on 02/19/2022 90 day [facility].   Medication Fill Gaps: Atenolol 25 mg last filled on 12/11/2021 90 day supply. Clopidogrel 75 mg last filled on 01/06/2022 90 day supply. Ezetimibe 10 mg last filled on 01/05/2022 90 day supply. Isosorbide Mononitrate 60 mg last filled on 01/05/2022 90 day supply.  Reviewed chart prior to disease state call. Spoke with patient regarding BP  Recent Office Vitals: BP Readings from Last 3 Encounters:  06/16/22 132/66  06/06/22 (!) 114/59  03/20/22 126/60   Pulse Readings from Last 3 Encounters:  06/16/22 64  06/06/22 64  03/20/22 60    Wt Readings from Last 3 Encounters:  06/16/22 121 lb 6.4 oz (55.1 kg)  06/06/22 122 lb 4.8 oz (55.5 kg)  03/20/22 124 lb 12.8 oz (56.6 kg)     Kidney Function  Lab Results  Component Value Date/Time   CREATININE 0.94 06/04/2022 08:45 AM   CREATININE 0.82 06/03/2022 03:38 AM   CREATININE 1.07 (H) 01/24/2021 02:50 PM   CREATININE 0.75 11/29/2015 10:46 AM   GFR 67.05 03/20/2022 11:34 AM   GFRNONAA 57 (L) 06/04/2022 08:45 AM   GFRAA >60 06/07/2020 03:14 AM       Latest Ref Rng & Units 06/04/2022    8:45 AM 06/03/2022    3:38 AM 06/02/2022    3:00 PM  BMP  Glucose 70 -  99 mg/dL 811  97  572   BUN 8 - 23 mg/dL 15  14  13    Creatinine 0.44 - 1.00 mg/dL  6.20  3.55   Sodium 135 - 145 mmol/L 133  132  134   Potassium 3.5 - 5.1 mmol/L 4.3  3.3  3.7   Chloride 98 - 111 mmol/L 93  101  97   CO2 22 - 32 mmol/L 29  26  29    Calcium 8.9 - 10.3 mg/dL 9.8  8.4  8.8     Current antihypertensive regimen:  Atenolol 25 mg 2 tablets daily  HCTZ 25 mg daily  Imdur 60 mg 1.5 tablets daily  Losartan 25 mg daily   I have attempted without success to contact this patient by phone three times to do her Hypertension Disease State call. I left a Voice message for patient to return my call.   What recent interventions/DTPs have been made by any provider to improve Blood Pressure control since last CPP Visit: None ID  Any recent hospitalizations or ED visits since last visit with CPP? No   Adherence Review: Is the patient currently on ACE/ARB medication? Yes Does the patient have >5 day gap between last estimated fill dates? No  9.74 Clinical [phone]

## 2022-07-02 ENCOUNTER — Other Ambulatory Visit: Payer: Self-pay | Admitting: Cardiovascular Disease

## 2022-07-07 ENCOUNTER — Emergency Department (HOSPITAL_COMMUNITY): Payer: Medicare Other

## 2022-07-07 ENCOUNTER — Encounter (HOSPITAL_COMMUNITY): Payer: Self-pay | Admitting: Pharmacy Technician

## 2022-07-07 ENCOUNTER — Other Ambulatory Visit: Payer: Self-pay

## 2022-07-07 ENCOUNTER — Emergency Department (HOSPITAL_COMMUNITY)
Admission: EM | Admit: 2022-07-07 | Discharge: 2022-07-07 | Discharge status: Against Medical Advice | Payer: Medicare Other | Attending: Student | Admitting: Student

## 2022-07-07 DIAGNOSIS — R030 Elevated blood-pressure reading, without diagnosis of hypertension: Secondary | ICD-10-CM | POA: Diagnosis present

## 2022-07-07 DIAGNOSIS — I6782 Cerebral ischemia: Secondary | ICD-10-CM | POA: Diagnosis not present

## 2022-07-07 DIAGNOSIS — Z5321 Procedure and treatment not carried out due to patient leaving prior to being seen by health care provider: Secondary | ICD-10-CM | POA: Diagnosis not present

## 2022-07-07 LAB — CBC WITH DIFFERENTIAL/PLATELET
Abs Immature Granulocytes: 0.02 10*3/uL (ref 0.00–0.07)
Basophils Absolute: 0 10*3/uL (ref 0.0–0.1)
Basophils Relative: 0 %
Eosinophils Absolute: 0.2 10*3/uL (ref 0.0–0.5)
Eosinophils Relative: 3 %
HCT: 39 % (ref 36.0–46.0)
Hemoglobin: 12.8 g/dL (ref 12.0–15.0)
Immature Granulocytes: 0 %
Lymphocytes Relative: 19 %
Lymphs Abs: 1.4 10*3/uL (ref 0.7–4.0)
MCH: 31.7 pg (ref 26.0–34.0)
MCHC: 32.8 g/dL (ref 30.0–36.0)
MCV: 96.5 fL (ref 80.0–100.0)
Monocytes Absolute: 0.6 10*3/uL (ref 0.1–1.0)
Monocytes Relative: 8 %
Neutro Abs: 5.1 10*3/uL (ref 1.7–7.7)
Neutrophils Relative %: 70 %
Platelets: 188 10*3/uL (ref 150–400)
RBC: 4.04 MIL/uL (ref 3.87–5.11)
RDW: 13.5 % (ref 11.5–15.5)
WBC: 7.5 10*3/uL (ref 4.0–10.5)
nRBC: 0 % (ref 0.0–0.2)

## 2022-07-07 LAB — BASIC METABOLIC PANEL
Anion gap: 9 (ref 5–15)
BUN: 17 mg/dL (ref 8–23)
CO2: 27 mmol/L (ref 22–32)
Calcium: 9.4 mg/dL (ref 8.9–10.3)
Chloride: 105 mmol/L (ref 98–111)
Creatinine, Ser: 1.02 mg/dL — ABNORMAL HIGH (ref 0.44–1.00)
GFR, Estimated: 52 mL/min — ABNORMAL LOW (ref >60)
Glucose, Bld: 87 mg/dL (ref 70–99)
Potassium: 4 mmol/L (ref 3.5–5.1)
Sodium: 141 mmol/L (ref 135–145)

## 2022-07-07 NOTE — ED Triage Notes (Addendum)
Pt here with reports of fall last night, went to pick something up off of the floor and fell on the ground. Denies dizziness, LOC. Pt with pain to L shoulder. Redness and bruising to L hand.

## 2022-07-07 NOTE — ED Notes (Signed)
Pt decided to leave while waiting for a room.  

## 2022-07-07 NOTE — ED Provider Triage Note (Addendum)
Emergency Medicine Provider Triage Evaluation Note  [name] , a 88 y.o. female  was evaluated in triage.  Pt complains of fall and high blood pressure.  Patient had an unwitnessed fall last night, she thinks she hit her head.  Does not think she blacked out, pain in her left hand since the fall.  States she fell when she was trying to pick up the leash, denies feeling dizzy but states she sometimes feels "swimmy headed".  Blood pressure was elevated today, called PCP told to go to ED for evaluation.  Denies chest pain, vision changes, headache.  Not on blood thinners.  Review of Systems  Per HPI  Physical Exam  BP (!) 160/76 (BP Location: Right Arm)   Pulse (!) 55   Temp 97.9 F (36.6 C) (Oral)   Resp 16   SpO2 95%  Gen:   Awake, no distress   Resp:  Normal effort  MSK:   Moves extremities without difficulty  Other:  Cranial nerves II through XII are grossly intact.  Lung sounds present in each lobe, bruising to dorsum of left hand and 5th finger.  ROM to wrist.  Medical Decision Making  Medically screening exam initiated at 12:12 PM.  Appropriate orders placed.  [name] was informed that the remainder of the evaluation will be completed by another provider, this initial triage assessment does not replace that evaluation, and the importance of remaining in the ED until their evaluation is complete.  Eval for hypertension urgency work-up initiated with imaging from fall.    Cynthia Arista, PA-C 07/07/22 1213    [name], [place] 07/07/22 1916

## 2022-07-24 ENCOUNTER — Other Ambulatory Visit: Payer: Self-pay | Admitting: Nurse Practitioner

## 2022-07-24 DIAGNOSIS — I10 Essential (primary) hypertension: Secondary | ICD-10-CM

## 2022-07-28 NOTE — Telephone Encounter (Signed)
Chart supports Rx Last OV: 02/2022 Next OV: 10/2022  

## 2022-08-25 ENCOUNTER — Telehealth: Payer: Self-pay | Admitting: Cardiovascular Disease

## 2022-08-25 ENCOUNTER — Inpatient Hospital Stay (HOSPITAL_COMMUNITY)
Admission: EM | Admit: 2022-08-25 | Discharge: 2022-09-02 | DRG: 291 | Disposition: A | Discharge status: Home Health | Payer: Medicare Other | Attending: Family Medicine | Admitting: Family Medicine

## 2022-08-25 ENCOUNTER — Encounter: Payer: Self-pay | Admitting: Physician Assistant

## 2022-08-25 ENCOUNTER — Other Ambulatory Visit: Payer: Self-pay

## 2022-08-25 ENCOUNTER — Encounter (HOSPITAL_COMMUNITY): Payer: Self-pay | Admitting: Emergency Medicine

## 2022-08-25 ENCOUNTER — Emergency Department (HOSPITAL_COMMUNITY): Payer: Medicare Other

## 2022-08-25 DIAGNOSIS — I5043 Acute on chronic combined systolic (congestive) and diastolic (congestive) heart failure: Secondary | ICD-10-CM | POA: Diagnosis present

## 2022-08-25 DIAGNOSIS — I480 Paroxysmal atrial fibrillation: Secondary | ICD-10-CM | POA: Diagnosis present

## 2022-08-25 DIAGNOSIS — I447 Left bundle-branch block, unspecified: Secondary | ICD-10-CM | POA: Diagnosis not present

## 2022-08-25 DIAGNOSIS — R079 Chest pain, unspecified: Secondary | ICD-10-CM

## 2022-08-25 DIAGNOSIS — G8194 Hemiplegia, unspecified affecting left nondominant side: Secondary | ICD-10-CM | POA: Diagnosis present

## 2022-08-25 DIAGNOSIS — I110 Hypertensive heart disease with heart failure: Secondary | ICD-10-CM | POA: Diagnosis not present

## 2022-08-25 DIAGNOSIS — I429 Cardiomyopathy, unspecified: Secondary | ICD-10-CM | POA: Diagnosis present

## 2022-08-25 DIAGNOSIS — I25119 Atherosclerotic heart disease of native coronary artery with unspecified angina pectoris: Secondary | ICD-10-CM | POA: Diagnosis present

## 2022-08-25 DIAGNOSIS — Z7982 Long term (current) use of aspirin: Secondary | ICD-10-CM

## 2022-08-25 DIAGNOSIS — Z515 Encounter for palliative care: Secondary | ICD-10-CM

## 2022-08-25 DIAGNOSIS — J45909 Unspecified asthma, uncomplicated: Secondary | ICD-10-CM | POA: Diagnosis present

## 2022-08-25 DIAGNOSIS — I5033 Acute on chronic diastolic (congestive) heart failure: Secondary | ICD-10-CM | POA: Diagnosis present

## 2022-08-25 DIAGNOSIS — R8271 Bacteriuria: Secondary | ICD-10-CM | POA: Diagnosis present

## 2022-08-25 DIAGNOSIS — Z9841 Cataract extraction status, right eye: Secondary | ICD-10-CM

## 2022-08-25 DIAGNOSIS — Z955 Presence of coronary angioplasty implant and graft: Secondary | ICD-10-CM

## 2022-08-25 DIAGNOSIS — Z634 Disappearance and death of family member: Secondary | ICD-10-CM

## 2022-08-25 DIAGNOSIS — I509 Heart failure, unspecified: Secondary | ICD-10-CM

## 2022-08-25 DIAGNOSIS — F4320 Adjustment disorder, unspecified: Secondary | ICD-10-CM | POA: Diagnosis present

## 2022-08-25 DIAGNOSIS — Z66 Do not resuscitate: Secondary | ICD-10-CM | POA: Diagnosis present

## 2022-08-25 DIAGNOSIS — Z888 Allergy status to other drugs, medicaments and biological substances status: Secondary | ICD-10-CM

## 2022-08-25 DIAGNOSIS — Z79899 Other long term (current) drug therapy: Secondary | ICD-10-CM

## 2022-08-25 DIAGNOSIS — M8580 Other specified disorders of bone density and structure, unspecified site: Secondary | ICD-10-CM | POA: Diagnosis present

## 2022-08-25 DIAGNOSIS — K219 Gastro-esophageal reflux disease without esophagitis: Secondary | ICD-10-CM | POA: Diagnosis present

## 2022-08-25 DIAGNOSIS — E876 Hypokalemia: Secondary | ICD-10-CM | POA: Diagnosis present

## 2022-08-25 DIAGNOSIS — Z20822 Contact with and (suspected) exposure to covid-19: Secondary | ICD-10-CM | POA: Diagnosis present

## 2022-08-25 DIAGNOSIS — R0609 Other forms of dyspnea: Principal | ICD-10-CM

## 2022-08-25 DIAGNOSIS — I160 Hypertensive urgency: Secondary | ICD-10-CM

## 2022-08-25 DIAGNOSIS — Z7901 Long term (current) use of anticoagulants: Secondary | ICD-10-CM

## 2022-08-25 DIAGNOSIS — J90 Pleural effusion, not elsewhere classified: Secondary | ICD-10-CM

## 2022-08-25 DIAGNOSIS — Z7902 Long term (current) use of antithrombotics/antiplatelets: Secondary | ICD-10-CM

## 2022-08-25 DIAGNOSIS — Z9842 Cataract extraction status, left eye: Secondary | ICD-10-CM

## 2022-08-25 DIAGNOSIS — E785 Hyperlipidemia, unspecified: Secondary | ICD-10-CM | POA: Diagnosis present

## 2022-08-25 DIAGNOSIS — Z8249 Family history of ischemic heart disease and other diseases of the circulatory system: Secondary | ICD-10-CM

## 2022-08-25 DIAGNOSIS — Z885 Allergy status to narcotic agent status: Secondary | ICD-10-CM

## 2022-08-25 DIAGNOSIS — R296 Repeated falls: Secondary | ICD-10-CM | POA: Diagnosis present

## 2022-08-25 DIAGNOSIS — R410 Disorientation, unspecified: Secondary | ICD-10-CM | POA: Diagnosis not present

## 2022-08-25 DIAGNOSIS — J9601 Acute respiratory failure with hypoxia: Secondary | ICD-10-CM

## 2022-08-25 DIAGNOSIS — I2510 Atherosclerotic heart disease of native coronary artery without angina pectoris: Secondary | ICD-10-CM | POA: Diagnosis present

## 2022-08-25 DIAGNOSIS — I4892 Unspecified atrial flutter: Secondary | ICD-10-CM | POA: Diagnosis not present

## 2022-08-25 LAB — CBC WITH DIFFERENTIAL/PLATELET
Abs Immature Granulocytes: 0.03 10*3/uL (ref 0.00–0.07)
Basophils Absolute: 0 10*3/uL (ref 0.0–0.1)
Basophils Relative: 0 %
Eosinophils Absolute: 0.2 10*3/uL (ref 0.0–0.5)
Eosinophils Relative: 2 %
HCT: 36.5 % (ref 36.0–46.0)
Hemoglobin: 11.8 g/dL — ABNORMAL LOW (ref 12.0–15.0)
Immature Granulocytes: 0 %
Lymphocytes Relative: 27 %
Lymphs Abs: 2.2 10*3/uL (ref 0.7–4.0)
MCH: 31.6 pg (ref 26.0–34.0)
MCHC: 32.3 g/dL (ref 30.0–36.0)
MCV: 97.9 fL (ref 80.0–100.0)
Monocytes Absolute: 0.6 10*3/uL (ref 0.1–1.0)
Monocytes Relative: 8 %
Neutro Abs: 4.9 10*3/uL (ref 1.7–7.7)
Neutrophils Relative %: 63 %
Platelets: 207 10*3/uL (ref 150–400)
RBC: 3.73 MIL/uL — ABNORMAL LOW (ref 3.87–5.11)
RDW: 14 % (ref 11.5–15.5)
WBC: 7.9 10*3/uL (ref 4.0–10.5)
nRBC: 0 % (ref 0.0–0.2)

## 2022-08-25 LAB — COMPREHENSIVE METABOLIC PANEL
ALT: 22 U/L (ref 0–44)
AST: 33 U/L (ref 15–41)
Albumin: 3.5 g/dL (ref 3.5–5.0)
Alkaline Phosphatase: 70 U/L (ref 38–126)
Anion gap: 11 (ref 5–15)
BUN: 16 mg/dL (ref 8–23)
CO2: 24 mmol/L (ref 22–32)
Calcium: 9.5 mg/dL (ref 8.9–10.3)
Chloride: 100 mmol/L (ref 98–111)
Creatinine, Ser: 0.79 mg/dL (ref 0.44–1.00)
GFR, Estimated: >60 mL/min (ref >60)
Glucose, Bld: 111 mg/dL — ABNORMAL HIGH (ref 70–99)
Potassium: 4.1 mmol/L (ref 3.5–5.1)
Sodium: 135 mmol/L (ref 135–145)
Total Bilirubin: 0.9 mg/dL (ref 0.3–1.2)
Total Protein: 6.8 g/dL (ref 6.5–8.1)

## 2022-08-25 LAB — URINALYSIS, ROUTINE W REFLEX MICROSCOPIC
Bacteria, UA: NONE SEEN
Bilirubin Urine: NEGATIVE
Glucose, UA: NEGATIVE mg/dL
Hgb urine dipstick: NEGATIVE
Ketones, ur: 5 mg/dL — AB
Nitrite: NEGATIVE
Protein, ur: 30 mg/dL — AB
Specific Gravity, Urine: 1.011 (ref 1.005–1.030)
pH: 6 (ref 5.0–8.0)

## 2022-08-25 LAB — BRAIN NATRIURETIC PEPTIDE: B Natriuretic Peptide: 2352.3 pg/mL — ABNORMAL HIGH (ref 0.0–100.0)

## 2022-08-25 LAB — TROPONIN I (HIGH SENSITIVITY): Troponin I (High Sensitivity): 24 ng/L — ABNORMAL HIGH (ref <18)

## 2022-08-25 NOTE — Telephone Encounter (Signed)
Pt c/o BP issue: STAT if pt c/o blurred vision, one-sided weakness or slurred speech  1. What are your last 5 BP readings?  12:58 PM 170/90 62  2. Are you having any other symptoms (ex. Dizziness, headache, blurred vision, passed out)?  Still having SOB/dizziness - no new symptoms since speaking with RN.  3. What is your BP issue?   Patient's granddaughter is following up to report most recent BP reading.
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
## 2022-08-25 NOTE — Telephone Encounter (Signed)
Pt c/o swelling: STAT is pt has developed SOB within 24 hours  If swelling, where is the swelling located? Both ankles are swollen.  She does have compression socks on.   How much weight have you gained and in what time span? She hasn't weighed herself   Have you gained 3 pounds in a day or 5 pounds in a week? unsure  Do you have a log of your daily weights (if so, list)? no  Are you currently taking a fluid pill? no  Are you currently SOB? SOB started the day before yesterday.    Have you traveled recently? No   Pt c/o BP issue: STAT if pt c/o blurred vision, one-sided weakness or slurred speech  1. What are your last 5 BP readings? Today at 10am 193/102  HR 71 10/2 at 742pm 187/93 63 laying down 10/2 at 739pm 187/87 63 laying down 10/1 at 830pm  182/104 89 10/1 at 845pm 101/91 85    2. Are you having any other symptoms (ex. Dizziness, headache, blurred vision, passed out)? Very dizzy  3. What is your BP issue? Elevated BP

## 2022-08-25 NOTE — ED Triage Notes (Signed)
Patient arrives with great granddaughter who she lives with states patients BPs have been running high over the past few days. States patients oxygen drops when she stands and starts walking. States patient has been having bilateral lower leg edema. Granddaughter spoke with pts doctor and was told to stop her midodrine.

## 2022-08-25 NOTE — Telephone Encounter (Signed)
Called patient back about her symptoms. Patient complaining of SOB at times (active and resting), BLE edema, and elevated BP. Patient had just taken her BP medications at the time she took her BP. Encouraged patient to take her BP around 12 to see if it is still up after taking atenolol and cozaar. Also, encouraged patient to hold her midodrine to see if that helps bring down her BP. Patient will call back if her BP does not improve. Made patient an appointment to see Melina Copa PA tomorrow.  Will send to Dr. Acie Fredrickson for further advisement.

## 2022-08-25 NOTE — Telephone Encounter (Signed)
1. What are your last 5 BP readings? Today at 10am 193/102  HR 71 10/2 at 742pm 187/93 63 laying down 10/2 at 739pm 187/87 63 laying down 10/1 at 830pm  182/104 89 10/1 at 845pm 101/91 85           States that for 3 days, her BP has been staying up, been very SOB even at rest. Condones dizziness and weakness. Has swelling in both lower extremities, localized to ankles-puffiness noted under the skin, not painful. Mainly rests with legs hanging dependant, advised she prop her feet up to help fluid retention. She hasn't been wearing her compression socks, which she will start doing at this time. At time of call (1350), she has taken the Losartan and Atenolol, but did still take the midodrine at 10am. She now understands that this medication needs to be held-it's to raise BP, she will hold it going forward. BP at 1200 was 170/90, HR 62. Oldest daughter passed away this 03-11-23 and she's having a lot of anxiety. Advised [name] (granddaughter on DPR who made the call) to call back today if BP doesn't trend down from the Midodrine wearing off. Gave ED precautions. Appt is scheduled for tomorrow with Melina Copa.

## 2022-08-25 NOTE — Telephone Encounter (Signed)
Returned call to pt's granddaughter who stated that she had the pulse oximeter on patient when she got up to go to the bathroom and her o2 level dropped to 52%. She states she got her back to her chair and had her sit there and it slowly crept up to the 70's, then after about 10 minutes leveled out around 91%. She is going to take her to the hospital tonight. Advised her that yes, this was too low, and should've bounced back quickly. She may likely have a pulmonary issue that's causing the SOB, but ED can address the hypertension as well. No further questions.

## 2022-08-25 NOTE — ED Provider Triage Note (Signed)
Emergency Medicine Provider Triage Evaluation Note  [name] , a 88 y.o. female  was evaluated in triage.  Pt complains of shortness of breath, lower extremity swelling and chest tightness over the last 3 days.  She is been having hypertension despite medical compliance with her blood pressure medicine, denies any syncopal episodes\.  Review of Systems  Per HPI  Physical Exam  BP (!) 178/101   Pulse 65   Temp 97.9 F (36.6 C) (Oral)   Resp 20   Ht 4\' 10"  (1.473 m)   Wt 54.9 kg   SpO2 92%   BMI 25.29 kg/m  Gen:   Awake, no distress   Resp:  Normal effort  MSK:   Moves extremities without difficulty  Other:  Lower extremity edema  Medical Decision Making  Medically screening exam initiated at 7:12 PM.  Appropriate orders placed.  [name] was informed that the remainder of the evaluation will be completed by another provider, this initial triage assessment does not replace that evaluation, and the importance of remaining in the ED until their evaluation is complete.     Sherrill Raring, PA-C 08/25/22 1914

## 2022-08-25 NOTE — Telephone Encounter (Signed)
Pt granddaughter calling back again to let Sarah, RN know that pt's is bp 169/108. She went out and bought a pulse oximeter and it was 91%

## 2022-08-26 ENCOUNTER — Inpatient Hospital Stay (HOSPITAL_COMMUNITY): Payer: Medicare Other

## 2022-08-26 ENCOUNTER — Other Ambulatory Visit (HOSPITAL_COMMUNITY): Payer: Self-pay

## 2022-08-26 ENCOUNTER — Ambulatory Visit: Payer: Medicare Other | Admitting: Physician Assistant

## 2022-08-26 ENCOUNTER — Telehealth: Payer: Self-pay | Admitting: Cardiovascular Disease

## 2022-08-26 ENCOUNTER — Emergency Department (HOSPITAL_COMMUNITY): Payer: Medicare Other

## 2022-08-26 ENCOUNTER — Encounter (HOSPITAL_COMMUNITY): Payer: Self-pay | Admitting: Internal Medicine

## 2022-08-26 DIAGNOSIS — I779 Disorder of arteries and arterioles, unspecified: Secondary | ICD-10-CM

## 2022-08-26 DIAGNOSIS — I10 Essential (primary) hypertension: Secondary | ICD-10-CM

## 2022-08-26 DIAGNOSIS — I110 Hypertensive heart disease with heart failure: Secondary | ICD-10-CM | POA: Diagnosis present

## 2022-08-26 DIAGNOSIS — I5043 Acute on chronic combined systolic (congestive) and diastolic (congestive) heart failure: Secondary | ICD-10-CM

## 2022-08-26 DIAGNOSIS — Z20822 Contact with and (suspected) exposure to covid-19: Secondary | ICD-10-CM | POA: Diagnosis present

## 2022-08-26 DIAGNOSIS — R296 Repeated falls: Secondary | ICD-10-CM | POA: Diagnosis present

## 2022-08-26 DIAGNOSIS — I509 Heart failure, unspecified: Secondary | ICD-10-CM

## 2022-08-26 DIAGNOSIS — I480 Paroxysmal atrial fibrillation: Secondary | ICD-10-CM | POA: Diagnosis present

## 2022-08-26 DIAGNOSIS — I5031 Acute diastolic (congestive) heart failure: Secondary | ICD-10-CM | POA: Diagnosis not present

## 2022-08-26 DIAGNOSIS — R0609 Other forms of dyspnea: Secondary | ICD-10-CM | POA: Diagnosis not present

## 2022-08-26 DIAGNOSIS — I2510 Atherosclerotic heart disease of native coronary artery without angina pectoris: Secondary | ICD-10-CM | POA: Diagnosis present

## 2022-08-26 DIAGNOSIS — J90 Pleural effusion, not elsewhere classified: Secondary | ICD-10-CM | POA: Diagnosis not present

## 2022-08-26 DIAGNOSIS — Z634 Disappearance and death of family member: Secondary | ICD-10-CM | POA: Diagnosis not present

## 2022-08-26 DIAGNOSIS — Z515 Encounter for palliative care: Secondary | ICD-10-CM | POA: Diagnosis not present

## 2022-08-26 DIAGNOSIS — G8194 Hemiplegia, unspecified affecting left nondominant side: Secondary | ICD-10-CM | POA: Diagnosis present

## 2022-08-26 DIAGNOSIS — E7800 Pure hypercholesterolemia, unspecified: Secondary | ICD-10-CM | POA: Diagnosis not present

## 2022-08-26 DIAGNOSIS — I4891 Unspecified atrial fibrillation: Secondary | ICD-10-CM

## 2022-08-26 DIAGNOSIS — I5033 Acute on chronic diastolic (congestive) heart failure: Secondary | ICD-10-CM

## 2022-08-26 DIAGNOSIS — E785 Hyperlipidemia, unspecified: Secondary | ICD-10-CM

## 2022-08-26 DIAGNOSIS — E876 Hypokalemia: Secondary | ICD-10-CM | POA: Diagnosis present

## 2022-08-26 DIAGNOSIS — I447 Left bundle-branch block, unspecified: Secondary | ICD-10-CM | POA: Diagnosis present

## 2022-08-26 DIAGNOSIS — I4892 Unspecified atrial flutter: Secondary | ICD-10-CM | POA: Diagnosis not present

## 2022-08-26 DIAGNOSIS — I429 Cardiomyopathy, unspecified: Secondary | ICD-10-CM | POA: Diagnosis present

## 2022-08-26 DIAGNOSIS — I160 Hypertensive urgency: Secondary | ICD-10-CM | POA: Diagnosis present

## 2022-08-26 DIAGNOSIS — Z66 Do not resuscitate: Secondary | ICD-10-CM | POA: Diagnosis present

## 2022-08-26 DIAGNOSIS — K219 Gastro-esophageal reflux disease without esophagitis: Secondary | ICD-10-CM | POA: Diagnosis present

## 2022-08-26 DIAGNOSIS — Z79899 Other long term (current) drug therapy: Secondary | ICD-10-CM | POA: Diagnosis not present

## 2022-08-26 DIAGNOSIS — F4320 Adjustment disorder, unspecified: Secondary | ICD-10-CM | POA: Diagnosis present

## 2022-08-26 DIAGNOSIS — J9601 Acute respiratory failure with hypoxia: Secondary | ICD-10-CM | POA: Diagnosis present

## 2022-08-26 DIAGNOSIS — R8271 Bacteriuria: Secondary | ICD-10-CM | POA: Diagnosis present

## 2022-08-26 DIAGNOSIS — M8580 Other specified disorders of bone density and structure, unspecified site: Secondary | ICD-10-CM | POA: Diagnosis present

## 2022-08-26 DIAGNOSIS — J45909 Unspecified asthma, uncomplicated: Secondary | ICD-10-CM | POA: Diagnosis present

## 2022-08-26 DIAGNOSIS — I5041 Acute combined systolic (congestive) and diastolic (congestive) heart failure: Secondary | ICD-10-CM | POA: Diagnosis not present

## 2022-08-26 DIAGNOSIS — Z7901 Long term (current) use of anticoagulants: Secondary | ICD-10-CM | POA: Diagnosis not present

## 2022-08-26 LAB — ECHOCARDIOGRAM COMPLETE
AR max vel: 1.77 cm2
AV Area VTI: 2.24 cm2
AV Area mean vel: 1.72 cm2
AV Mean grad: 4 mmHg
AV Peak grad: 7.4 mmHg
Ao pk vel: 1.36 m/s
Area-P 1/2: 5.7 cm2
Calc EF: 22.6 %
Height: 58 in
MV M vel: 4.52 m/s
MV Peak grad: 81.6 mmHg
P 1/2 time: 380 msec
S' Lateral: 3.9 cm
Single Plane A2C EF: 23.6 %
Single Plane A4C EF: 18.2 %
Weight: 1936 oz

## 2022-08-26 LAB — BASIC METABOLIC PANEL
Anion gap: 15 (ref 5–15)
BUN: 15 mg/dL (ref 8–23)
CO2: 28 mmol/L (ref 22–32)
Calcium: 10.3 mg/dL (ref 8.9–10.3)
Chloride: 94 mmol/L — ABNORMAL LOW (ref 98–111)
Creatinine, Ser: 0.81 mg/dL (ref 0.44–1.00)
GFR, Estimated: >60 mL/min (ref >60)
Glucose, Bld: 114 mg/dL — ABNORMAL HIGH (ref 70–99)
Potassium: 4 mmol/L (ref 3.5–5.1)
Sodium: 137 mmol/L (ref 135–145)

## 2022-08-26 LAB — TROPONIN I (HIGH SENSITIVITY): Troponin I (High Sensitivity): 25 ng/L — ABNORMAL HIGH (ref <18)

## 2022-08-26 LAB — MAGNESIUM: Magnesium: 1.8 mg/dL (ref 1.7–2.4)

## 2022-08-26 MED ORDER — ATENOLOL 25 MG PO TABS
25.0000 mg | ORAL_TABLET | Freq: Two times a day (BID) | ORAL | Status: DC
  Administered 2022-08-26: 25 mg via ORAL
  Filled 2022-08-26: qty 1

## 2022-08-26 MED ORDER — CARVEDILOL 12.5 MG PO TABS
12.5000 mg | ORAL_TABLET | Freq: Two times a day (BID) | ORAL | Status: DC
  Administered 2022-08-27 – 2022-08-29 (×6): 12.5 mg via ORAL
  Filled 2022-08-26 (×8): qty 1

## 2022-08-26 MED ORDER — EZETIMIBE 10 MG PO TABS
10.0000 mg | ORAL_TABLET | Freq: Every day | ORAL | Status: DC
  Administered 2022-08-26 – 2022-09-02 (×8): 10 mg via ORAL
  Filled 2022-08-26 (×8): qty 1

## 2022-08-26 MED ORDER — HALOPERIDOL LACTATE 5 MG/ML IJ SOLN
5.0000 mg | Freq: Once | INTRAMUSCULAR | Status: AC
  Administered 2022-08-26: 5 mg via INTRAVENOUS

## 2022-08-26 MED ORDER — CARVEDILOL 12.5 MG PO TABS: 12.5000 mg | ORAL_TABLET | Freq: Two times a day (BID) | ORAL | Status: DC

## 2022-08-26 MED ORDER — FUROSEMIDE 10 MG/ML IJ SOLN
40.0000 mg | Freq: Two times a day (BID) | INTRAMUSCULAR | Status: DC
  Administered 2022-08-26 (×2): 40 mg via INTRAVENOUS
  Filled 2022-08-26 (×2): qty 4

## 2022-08-26 MED ORDER — LOSARTAN POTASSIUM 50 MG PO TABS: 50.0000 mg | ORAL_TABLET | Freq: Every day | ORAL | Status: DC

## 2022-08-26 MED ORDER — ASPIRIN 325 MG PO TABS: 325.0000 mg | ORAL_TABLET | Freq: Every day | ORAL | Status: DC

## 2022-08-26 MED ORDER — LOSARTAN POTASSIUM 50 MG PO TABS: 25.0000 mg | ORAL_TABLET | Freq: Every day | ORAL | Status: DC

## 2022-08-26 MED ORDER — SACUBITRIL-VALSARTAN 24-26 MG PO TABS
1.0000 | ORAL_TABLET | Freq: Two times a day (BID) | ORAL | Status: DC
  Administered 2022-08-26 – 2022-08-29 (×7): 1 via ORAL
  Filled 2022-08-26 (×8): qty 1

## 2022-08-26 MED ORDER — HYDRALAZINE HCL 20 MG/ML IJ SOLN: 5.0000 mg | Freq: Four times a day (QID) | INTRAMUSCULAR | Status: DC | PRN

## 2022-08-26 MED ORDER — ISOSORBIDE MONONITRATE ER 30 MG PO TB24
60.0000 mg | ORAL_TABLET | Freq: Every day | ORAL | Status: DC
  Administered 2022-08-26: 60 mg via ORAL
  Filled 2022-08-26: qty 2

## 2022-08-26 MED ORDER — FUROSEMIDE 10 MG/ML IJ SOLN
40.0000 mg | Freq: Once | INTRAMUSCULAR | Status: AC
  Administered 2022-08-26: 40 mg via INTRAVENOUS
  Filled 2022-08-26: qty 4

## 2022-08-26 MED ORDER — LOSARTAN POTASSIUM 25 MG PO TABS: 12.5000 mg | ORAL_TABLET | Freq: Every day | ORAL | Status: DC

## 2022-08-26 MED ORDER — CLOPIDOGREL BISULFATE 75 MG PO TABS
75.0000 mg | ORAL_TABLET | Freq: Every day | ORAL | Status: DC
  Administered 2022-08-26 – 2022-09-02 (×8): 75 mg via ORAL
  Filled 2022-08-26 (×8): qty 1

## 2022-08-26 MED ORDER — ACETAMINOPHEN 650 MG RE SUPP: 650.0000 mg | Freq: Four times a day (QID) | RECTAL | Status: DC | PRN

## 2022-08-26 MED ORDER — METOPROLOL TARTRATE 5 MG/5ML IV SOLN
5.0000 mg | INTRAVENOUS | Status: DC | PRN
  Administered 2022-08-26 – 2022-08-29 (×2): 5 mg via INTRAVENOUS
  Filled 2022-08-26 (×2): qty 5

## 2022-08-26 MED ORDER — IOHEXOL 350 MG/ML SOLN
80.0000 mL | Freq: Once | INTRAVENOUS | Status: AC | PRN
  Administered 2022-08-26: 80 mL via INTRAVENOUS

## 2022-08-26 MED ORDER — ACETAMINOPHEN 325 MG PO TABS: 650.0000 mg | ORAL_TABLET | Freq: Four times a day (QID) | ORAL | Status: DC | PRN

## 2022-08-26 MED ORDER — ALBUTEROL SULFATE (2.5 MG/3ML) 0.083% IN NEBU: 3.0000 mL | INHALATION_SOLUTION | Freq: Four times a day (QID) | RESPIRATORY_TRACT | Status: DC | PRN

## 2022-08-26 MED ORDER — LOSARTAN POTASSIUM 25 MG PO TABS
12.5000 mg | ORAL_TABLET | Freq: Every day | ORAL | Status: DC
  Administered 2022-08-26: 12.5 mg via ORAL
  Filled 2022-08-26: qty 0.5

## 2022-08-26 MED ORDER — HYDRALAZINE HCL 20 MG/ML IJ SOLN
10.0000 mg | Freq: Once | INTRAMUSCULAR | Status: AC
  Administered 2022-08-26: 10 mg via INTRAVENOUS
  Filled 2022-08-26: qty 1

## 2022-08-26 MED ORDER — ENOXAPARIN SODIUM 40 MG/0.4ML IJ SOSY
40.0000 mg | PREFILLED_SYRINGE | INTRAMUSCULAR | Status: DC
  Administered 2022-08-26: 40 mg via SUBCUTANEOUS
  Filled 2022-08-26: qty 0.4

## 2022-08-26 MED ORDER — HYDRALAZINE HCL 20 MG/ML IJ SOLN: 10.0000 mg | Freq: Four times a day (QID) | INTRAMUSCULAR | Status: DC | PRN

## 2022-08-26 MED ORDER — METHOCARBAMOL 500 MG PO TABS
500.0000 mg | ORAL_TABLET | Freq: Once | ORAL | Status: AC
  Administered 2022-08-26: 500 mg via ORAL
  Filled 2022-08-26: qty 1

## 2022-08-26 MED ORDER — HALOPERIDOL LACTATE 5 MG/ML IJ SOLN
INTRAMUSCULAR | Status: AC
  Filled 2022-08-26: qty 1

## 2022-08-26 MED ORDER — ATENOLOL 25 MG PO TABS: 25.0000 mg | ORAL_TABLET | Freq: Two times a day (BID) | ORAL | Status: DC

## 2022-08-26 MED ORDER — ASPIRIN 81 MG PO TBEC
81.0000 mg | DELAYED_RELEASE_TABLET | Freq: Every day | ORAL | Status: DC
  Administered 2022-08-26 – 2022-08-27 (×2): 81 mg via ORAL
  Filled 2022-08-26 (×2): qty 1

## 2022-08-26 NOTE — Progress Notes (Addendum)
Heart Failure Nurse Navigator Progress Note  Following this hospitalization to assess for HV TOC readiness. Pending updated ECHO.   05/2022: EF 55-60%, G1DD, MV degenerative, AV severe calcification.   BNP 2350.  SCr 0.81  Had hospitalization in July 23 for syncope, meds adjusted.  Primary Cardiologist: [name], MD  Will continue to follow to determine HV TOC eligibility.    Pricilla Holm, MSN, RN Heart Failure Nurse Navigator
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
## 2022-08-26 NOTE — Progress Notes (Signed)
  Echocardiogram 2D Echocardiogram has been performed.  [name] 08/26/2022, 9:06 AM 
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
## 2022-08-26 NOTE — ED Notes (Signed)
Pt ambulated to the bathroom. Pt's gait is unsteady and cannot walk alone. Pts O2 sat dropped to high 80s when returned to the room.

## 2022-08-26 NOTE — Progress Notes (Signed)
Heart Failure Stewardship Pharmacist Progress Note   PCP: Nche, Charlene Brooke, NP PCP-Cardiologist: [name], MD    HPI:  88 yo F with PMH of CAD s/p PCI, CHF, asthma, GERD, HTN, and HLD.  Recently admitted from 7/11-7/15 with syncopal episode likely due to orthostatic hypotension. ECHO at that time with LVEF 55-60%, mild LVH, RV normal. Discharged on midodrine and HCTZ stopped.   Presented to the ED on 10/3 with shortness of breath, HTN, hypoxia, LE edema, and near syncope. CXR with cardiomegaly, chronic appearing increased interstitial lung markings, and small bilateral pleural effusions. CTA negative for PE. ECHO 10/4 with newly reduced LVEF 30-35%, mild LVH with G3DD, RV mildly reduced.  Current HF Medications: Diuretic: furosemide 40 mg IV BID ACE/ARB/ARNI: losartan 12.5 mg daily  Prior to admission HF Medications: ACE/ARB/ARNI: losartan 12.5 mg daily *also taking midodrine 10 mg TID for orthostatic hypotension  Pertinent Lab Values: Serum creatinine 0.81, BUN 15, Potassium 4.0 (hemolyzed), Sodium 137, BNP 2352.3, Magnesium 1.8   Vital Signs: Weight: 121 lbs (admission weight: 121 lbs) Blood pressure: 180/80s this AM, down to 130/90 last check  Heart rate: 60-70s  I/O: not yet documented  Medication Assistance / Insurance Benefits Check: Does the patient have prescription insurance?  Yes Type of insurance plan: UHC Medicare  Does the patient qualify for medication assistance through manufacturers or grants?   Pending Eligible grants and/or patient assistance programs: pending Medication assistance applications in progress: none  Medication assistance applications approved: none Approved medication assistance renewals will be completed by: pending  Outpatient Pharmacy:  Prior to admission [facility] Is the patient willing to use [facility] at discharge? Yes Is the patient willing to transition their outpatient pharmacy to utilize a [facility]  outpatient pharmacy?   Pending    Assessment: 1. Acute on chronic systolic and diastolic CHF (LVEF 16-96%). NYHA class III symptoms. - Continue furosemide 40 mg IV BID. Strict I/Os and daily weights. Keep K>4 and Mag>2. Recommend replacing magnesium with 2g IV x 1. - Consider transitioning atenolol to HF BB prior to discharge with new HFrEF. Metoprolol XL could be considered as it is also beta-1 selective. - Continue losartan 12.5 mg daily. Unlikely to be able to tolerate optimization to [facility] at this time with history of orthostatic hypotension and frequent falls. - Consider adding spironolactone 12.5 mg daily - Consider adding SGLT2i prior to discharge. UA completed. No urinary symptoms reported.  - Suspected that midodrine will not need to be continued, orthostatic symptoms may be related to poor oral intake/malnutrition  Plan: 1) Medication changes recommended at this time: - Add spironolactone 12.5 mg daily - Magnesium 2g IV x1 - Transition atenolol to metoprolol XL prior to discharge  2) Patient assistance: - Currently in coverage gap (donut hole) - Initial Farxiga copay $99 - Initial Jardiance copay $110 - Can apply for additional patient assistance if SGLT2i is added  3)  Education  - To be completed prior to discharge  Kerby Nora, PharmD, BCPS Heart Failure Cytogeneticist Phone [phone]

## 2022-08-26 NOTE — Progress Notes (Signed)
Patient admitted after midnight, please see H&P.  Here with SOB suspected to be a CHF exacerbation.  Getting IV lasix and cardiology has been consulted.   Eulogio Bear DO
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
## 2022-08-26 NOTE — Evaluation (Signed)
Physical Therapy Evaluation Patient Details Name: [name] MRN: [id number] DOB: [birth year] Today's Date: 08/26/2022  History of Present Illness  Pt is a 88 y/o female admitted secondary to increased SOB and LE swelling. Thought to be secondary to CHF exacerbation. PMH includes CAD s/p PCI, asthma, and HTN.  Clinical Impression  Pt admitted secondary to problem above with deficits below. Pt requiring min to mod A for mobility tasks this session. Mild posterior lean, but able to correct with cues. Pt's daughter present and reports family can assist as needed at d/c, and reports plan is for pt to return home. Recommending HHPT at d/c to address deficits. Will continue to follow acutely.        Recommendations for follow up therapy are one component of a multi-disciplinary discharge planning process, led by the attending physician.  Recommendations may be updated based on patient status, additional functional criteria and insurance authorization.  Follow Up Recommendations Home health PT      Assistance Recommended at Discharge Frequent or constant Supervision/Assistance  Patient can return home with the following  A lot of help with walking and/or transfers;A lot of help with bathing/dressing/bathroom;Assistance with cooking/housework;Assist for transportation;Help with stairs or ramp for entrance    Equipment Recommendations Other (comment) (TBD pending progression)  Recommendations for Other Services       Functional Status Assessment Patient has had a recent decline in their functional status and demonstrates the ability to make significant improvements in function in a reasonable and predictable amount of time.     Precautions / Restrictions Precautions Precautions: Fall Restrictions Weight Bearing Restrictions: No      Mobility  Bed Mobility Overal bed mobility: Needs Assistance Bed Mobility: Supine to Sit, Sit to Supine     Supine to sit: Supervision Sit to  supine: Min assist   General bed mobility comments: Assist for LEs to return to supine    Transfers Overall transfer level: Needs assistance Equipment used: 2 person hand held assist Transfers: Sit to/from Stand Sit to Stand: Mod assist, Min assist, +2 physical assistance           General transfer comment: Pt initially with posterior lean, but able to correct with cues. Requiring mod A for lift assist to stand and min A to maintain balance in standing and to take side steps at EOB for repositioning. Daugter present and assisting    Ambulation/Gait                  Stairs            Wheelchair Mobility    Modified Rankin (Stroke Patients Only)       Balance Overall balance assessment: Needs assistance Sitting-balance support: No upper extremity supported, Feet supported Sitting balance-Leahy Scale: Good     Standing balance support: Bilateral upper extremity supported Standing balance-Leahy Scale: Poor Standing balance comment: Reliant on UE and external support                             Pertinent Vitals/Pain Pain Assessment Pain Assessment: No/denies pain    Home Living Family/patient expects to be discharged to:: Private residence Living Arrangements: Other relatives;Spouse/significant other (niece) Available Help at Discharge: Family Type of Home: House Home Access: Stairs to enter Entrance Stairs-Rails: None Entrance Stairs-Number of Steps: 1   Home Layout: Laundry or work area in basement;Able to live on main level with bedroom/bathroom;Two level Home Equipment: Conservation officer, nature (2  wheels);Cane - quad;Shower seat      Prior Function Prior Level of Function : Independent/Modified Independent             Mobility Comments: normally uses walking stick       Hand Dominance        Extremity/Trunk Assessment   Upper Extremity Assessment Upper Extremity Assessment: Defer to OT evaluation    Lower Extremity  Assessment Lower Extremity Assessment: Generalized weakness    Cervical / Trunk Assessment Cervical / Trunk Assessment: Kyphotic  Communication   Communication: No difficulties  Cognition Arousal/Alertness: Awake/alert Behavior During Therapy: WFL for tasks assessed/performed Overall Cognitive Status: Impaired/Different from baseline Area of Impairment: Memory, Safety/judgement, Problem solving                     Memory: Decreased short-term memory, Decreased recall of precautions   Safety/Judgement: Decreased awareness of deficits   Problem Solving: Slow processing          General Comments      Exercises     Assessment/Plan    PT Assessment Patient needs continued PT services  PT Problem List Decreased strength;Decreased activity tolerance;Decreased balance;Decreased mobility;Decreased knowledge of use of DME;Decreased knowledge of precautions;Decreased cognition;Decreased safety awareness       PT Treatment Interventions DME instruction;Gait training;Functional mobility training;Therapeutic exercise;Therapeutic activities;Balance training;Stair training;Patient/family education    PT Goals (Current goals can be found in the Care Plan section)  Acute Rehab PT Goals Patient Stated Goal: to go home PT Goal Formulation: With patient/family Time For Goal Achievement: 09/09/22 Potential to Achieve Goals: Good    Frequency Min 3X/week     Co-evaluation               AM-PAC PT "6 Clicks" Mobility  Outcome Measure Help needed turning from your back to your side while in a flat bed without using bedrails?: A Little Help needed moving from lying on your back to sitting on the side of a flat bed without using bedrails?: A Little Help needed moving to and from a bed to a chair (including a wheelchair)?: A Little Help needed standing up from a chair using your arms (e.g., wheelchair or bedside chair)?: A Lot Help needed to walk in hospital room?: A  Lot Help needed climbing 3-5 steps with a railing? : A Lot 6 Click Score: 15    End of Session   Activity Tolerance: Patient tolerated treatment well Patient left: in bed;with call bell/phone within reach;with family/visitor present (on stretcher in ED) Nurse Communication: Mobility status PT Visit Diagnosis: Unsteadiness on feet (R26.81);Muscle weakness (generalized) (M62.81);Difficulty in walking, not elsewhere classified (R26.2)    Time: 1245-1301 PT Time Calculation (min) (ACUTE ONLY): 16 min   Charges:   PT Evaluation $PT Eval Moderate Complexity: 1 Mod          Reuel Derby, PT, DPT  Acute Rehabilitation Services  Office: [phone]   Rudean Hitt 08/26/2022, 2:54 PM

## 2022-08-26 NOTE — ED Notes (Signed)
Patient refusing to sit back in the bed reports that she is seeing bugs and hears her dog  barking at home. This RN messages information to primary nurse and MD due to safety concern for the patient is a fall risk.

## 2022-08-26 NOTE — Telephone Encounter (Signed)
Called patient's great granddaughter back to let her know that we can not discuss anything about the patient with her. She is not on the [facility] list. Informed her that she can talk to the nurses and doctors that are taking care of patient at the hospital. She stated she would call back when she is with the patient tomorrow.
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
## 2022-08-26 NOTE — ED Notes (Signed)
Patient transported to CT 

## 2022-08-26 NOTE — Progress Notes (Signed)
Pt appears to be in rapid a fib with stable BP. Plan to give IV Lopressor as needed for rate-control.

## 2022-08-27 ENCOUNTER — Other Ambulatory Visit (HOSPITAL_COMMUNITY): Payer: Self-pay

## 2022-08-27 ENCOUNTER — Telehealth (HOSPITAL_COMMUNITY): Payer: Self-pay | Admitting: Pharmacy Technician

## 2022-08-27 DIAGNOSIS — J90 Pleural effusion, not elsewhere classified: Secondary | ICD-10-CM | POA: Diagnosis not present

## 2022-08-27 DIAGNOSIS — I447 Left bundle-branch block, unspecified: Secondary | ICD-10-CM

## 2022-08-27 DIAGNOSIS — I160 Hypertensive urgency: Secondary | ICD-10-CM

## 2022-08-27 DIAGNOSIS — I5041 Acute combined systolic (congestive) and diastolic (congestive) heart failure: Secondary | ICD-10-CM

## 2022-08-27 DIAGNOSIS — R0609 Other forms of dyspnea: Secondary | ICD-10-CM | POA: Diagnosis not present

## 2022-08-27 LAB — BASIC METABOLIC PANEL
Anion gap: 15 (ref 5–15)
Anion gap: 12 (ref 5–15)
BUN: 17 mg/dL (ref 8–23)
BUN: 18 mg/dL (ref 8–23)
CO2: 28 mmol/L (ref 22–32)
CO2: 27 mmol/L (ref 22–32)
Calcium: 9.5 mg/dL (ref 8.9–10.3)
Calcium: 9.1 mg/dL (ref 8.9–10.3)
Chloride: 90 mmol/L — ABNORMAL LOW (ref 98–111)
Chloride: 92 mmol/L — ABNORMAL LOW (ref 98–111)
Creatinine, Ser: 1 mg/dL (ref 0.44–1.00)
Creatinine, Ser: 0.92 mg/dL (ref 0.44–1.00)
GFR, Estimated: 53 mL/min — ABNORMAL LOW (ref >60)
GFR, Estimated: 58 mL/min — ABNORMAL LOW (ref >60)
Glucose, Bld: 130 mg/dL — ABNORMAL HIGH (ref 70–99)
Glucose, Bld: 151 mg/dL — ABNORMAL HIGH (ref 70–99)
Potassium: 2.5 mmol/L — CL (ref 3.5–5.1)
Potassium: 3.2 mmol/L — ABNORMAL LOW (ref 3.5–5.1)
Sodium: 133 mmol/L — ABNORMAL LOW (ref 135–145)
Sodium: 131 mmol/L — ABNORMAL LOW (ref 135–145)

## 2022-08-27 LAB — CBC
HCT: 39 % (ref 36.0–46.0)
Hemoglobin: 13.1 g/dL (ref 12.0–15.0)
MCH: 30.8 pg (ref 26.0–34.0)
MCHC: 33.6 g/dL (ref 30.0–36.0)
MCV: 91.5 fL (ref 80.0–100.0)
Platelets: 236 10*3/uL (ref 150–400)
RBC: 4.26 MIL/uL (ref 3.87–5.11)
RDW: 14 % (ref 11.5–15.5)
WBC: 7.6 10*3/uL (ref 4.0–10.5)
nRBC: 0 % (ref 0.0–0.2)

## 2022-08-27 LAB — MAGNESIUM: Magnesium: 1.8 mg/dL (ref 1.7–2.4)

## 2022-08-27 MED ORDER — APIXABAN 2.5 MG PO TABS
2.5000 mg | ORAL_TABLET | Freq: Two times a day (BID) | ORAL | Status: DC
  Administered 2022-08-27 – 2022-09-02 (×13): 2.5 mg via ORAL
  Filled 2022-08-27 (×13): qty 1

## 2022-08-27 MED ORDER — POTASSIUM CHLORIDE 10 MEQ/100ML IV SOLN
10.0000 meq | INTRAVENOUS | Status: AC
  Administered 2022-08-27 (×4): 10 meq via INTRAVENOUS
  Filled 2022-08-27 (×4): qty 100

## 2022-08-27 MED ORDER — POTASSIUM CHLORIDE CRYS ER 20 MEQ PO TBCR
40.0000 meq | EXTENDED_RELEASE_TABLET | Freq: Once | ORAL | Status: AC
  Administered 2022-08-27: 40 meq via ORAL
  Filled 2022-08-27: qty 2

## 2022-08-27 MED ORDER — MIDODRINE HCL 5 MG PO TABS: 10.0000 mg | ORAL_TABLET | Freq: Three times a day (TID) | ORAL | Status: DC

## 2022-08-27 MED ORDER — MELATONIN 3 MG PO TABS
3.0000 mg | ORAL_TABLET | Freq: Every evening | ORAL | Status: DC | PRN
  Administered 2022-08-27 – 2022-09-01 (×5): 3 mg via ORAL
  Filled 2022-08-27 (×5): qty 1

## 2022-08-27 MED ORDER — ENOXAPARIN SODIUM 30 MG/0.3ML IJ SOSY
30.0000 mg | PREFILLED_SYRINGE | Freq: Every day | INTRAMUSCULAR | Status: DC
  Administered 2022-08-27: 30 mg via SUBCUTANEOUS
  Filled 2022-08-27: qty 0.3

## 2022-08-27 MED ORDER — MAGNESIUM SULFATE IN D5W 1-5 GM/100ML-% IV SOLN
1.0000 g | Freq: Once | INTRAVENOUS | Status: AC
  Administered 2022-08-27: 1 g via INTRAVENOUS
  Filled 2022-08-27: qty 100

## 2022-08-27 MED ORDER — SPIRONOLACTONE 25 MG PO TABS
25.0000 mg | ORAL_TABLET | Freq: Every day | ORAL | Status: DC
  Administered 2022-08-27 – 2022-08-29 (×3): 25 mg via ORAL
  Filled 2022-08-27 (×3): qty 1

## 2022-08-27 NOTE — Progress Notes (Signed)
PROGRESS NOTE    [name]  [id number] DOB: [birth year] DOA: 08/25/2022 PCP: [name], NP    Brief Narrative:  [name] is a 88 y.o. female with medical history significant of CAD status post PCI, HFpEF, asthma, GERD, hypertension, hyperlipidemia.  Admitted in July 2023 for syncope secondary to orthostatic hypotension.  She was started on midodrine and hydrochlorothiazide discontinued.  She presents to the ED complaining of exertional dyspnea and chest tightness, near syncope, and bilateral lower extremity edema.  Hypertensive with systolic in the [id number] to [id number].  Desatted to 73s with ambulation.  Labs showing no leukocytosis, hemoglobin 11.8 (no significant change from baseline), creatinine 0.7, troponin 24> 25, BNP 2352.  UA with negative nitrite, small amount of leukocytes, and microscopy showing 6-10 WBCs and no bacteria.  CTA chest negative for PE.  Showing bilateral pleural effusions. Patient was given aspirin, IV Lasix 40 mg, and IV hydralazine 10 mg.   Patient is reporting 3-day history of exertional dyspnea and chest tightness.  Every time she tries to walk, she feels lightheaded and feels like she will pass out.  No loss of consciousness reported.  She is also reporting bilateral lower extremity edema.  Daughter is concerned that when they checked her oxygen saturation at home after she walked, it was as low as 50s to 60s.  She is taking all of her home antihypertensives and is also on midodrine due to history of orthostatic hypotension.  Patient denies any chest pain at this time.  No other complaints.    Assessment and Plan: Acute on chronic diastolic CHF with Acute hypoxemic respiratory failure Not hypoxic at rest but oxygen saturation dropped to the 80s with ambulation.  BNP 2352.  CTA chest negative for PE; showing bilateral pleural effusions. Echo done in July 2023 showing EF 55 to 123456, grade 1 diastolic dysfunction, mild MR, severe thickening of the aortic  valve without evidence of stenosis.  Now EF 30%,  She is not on diuretics at home.   -IV Lasix 40 mg twice daily -Monitor intake and output -Daily weights -Low-sodium diet with fluid restriction -per cards   Chest pain/CAD with multiple stents CTA chest negative for PE.  She does have a new left bundle branch block on EKG.  Troponin mildly elevated in the setting of acute CHF but stable (24> 25) and not consistent with ACS.  Currently chest pain-free and appears comfortable. LHC done April 2022   -cards following  Hypokalemia -replete  Episode of a fib -rate controlled -add eliquis  Hypertensive urgency Blood pressure remains elevated with systolic in the [id number].  Cardiology recommending continuing home meds, diuresis with IV Lasix, and IV hydralazine prn.  -per cards   Asthma Stable, no signs of acute exacerbation. -Continue albuterol as needed   Hyperlipidemia -Continue Zetia   Abnormal urinalysis UA with negative nitrite, small amount of leukocytes, and microscopy showing 6-10 WBCs and no bacteria.  Patient is not endorsing any urinary symptoms.   DVT prophylaxis: apixaban (ELIQUIS) tablet 2.5 mg Start: 08/27/22 1245 apixaban (ELIQUIS) tablet 2.5 mg    Code Status: DNR Family Communication: daughter at bedside; called granddaughter  Disposition Plan:  Level of care: Progressive Status is: Inpatient Remains inpatient appropriate because: needs diuresis    Consultants:  cards   Subjective: Working with OT  Objective: Vitals:   08/27/22 0320 08/27/22 0726 08/27/22 0800 08/27/22 1105  BP: 123/70 (!) 152/84  (!) 166/82  Pulse:   100 65  Resp: 17  11  19  Temp: 98.6 F (37 C) 97.7 F (36.5 C)    TempSrc: Oral     SpO2: 93%  93% 93%  Weight:      Height:        Intake/Output Summary (Last 24 hours) at 08/27/2022 1215 Last data filed at 08/27/2022 3474 Gross per 24 hour  Intake 326.01 ml  Output 650 ml  Net -323.99 ml   Filed Weights   08/25/22 1859  08/26/22 2000 08/27/22 0317  Weight: 54.9 kg 52.5 kg 51.1 kg    Examination:   General: Appearance:    elderly female in no acute distress     Lungs:      respirations unlabored  Heart:    Normal heart rate.    MS:   All extremities are intact.    Neurologic:   Awake, alert       Data Reviewed: I have personally reviewed following labs and imaging studies  CBC: Recent Labs  Lab 08/25/22 1918 08/27/22 0028  WBC 7.9 7.6  NEUTROABS 4.9  --   HGB 11.8* 13.1  HCT 36.5 39.0  MCV 97.9 91.5  PLT 207 259   Basic Metabolic Panel: Recent Labs  Lab 08/25/22 1918 08/26/22 0554 08/27/22 0028  NA 135 137 133*  K 4.1 4.0 2.5*  CL 100 94* 90*  CO2 24 28 28   GLUCOSE 111* 114* 130*  BUN 16 15 17   CREATININE 0.79 0.81 1.00  CALCIUM 9.5 10.3 9.5  MG  --  1.8  --    GFR: Estimated Creatinine Clearance: 25.5 mL/min (by C-G formula based on SCr of 1 mg/dL). Liver Function Tests: Recent Labs  Lab 08/25/22 1918  AST 33  ALT 22  ALKPHOS 70  BILITOT 0.9  PROT 6.8  ALBUMIN 3.5   No results for input(s): "LIPASE", "AMYLASE" in the last 168 hours. No results for input(s): "AMMONIA" in the last 168 hours. Coagulation Profile: No results for input(s): "INR", "PROTIME" in the last 168 hours. Cardiac Enzymes: No results for input(s): "CKTOTAL", "CKMB", "CKMBINDEX", "TROPONINI" in the last 168 hours. BNP (last 3 results) No results for input(s): "PROBNP" in the last 8760 hours. HbA1C: No results for input(s): "HGBA1C" in the last 72 hours. CBG: No results for input(s): "GLUCAP" in the last 168 hours. Lipid Profile: No results for input(s): "CHOL", "HDL", "LDLCALC", "TRIG", "CHOLHDL", "LDLDIRECT" in the last 72 hours. Thyroid Function Tests: No results for input(s): "TSH", "T4TOTAL", "FREET4", "T3FREE", "THYROIDAB" in the last 72 hours. Anemia Panel: No results for input(s): "VITAMINB12", "FOLATE", "FERRITIN", "TIBC", "IRON", "RETICCTPCT" in the last 72 hours. Sepsis  Labs: No results for input(s): "PROCALCITON", "LATICACIDVEN" in the last 168 hours.  No results found for this or any previous visit (from the past 240 hour(s)).       Radiology Studies: ECHOCARDIOGRAM COMPLETE  Result Date: 08/26/2022    ECHOCARDIOGRAM REPORT   Patient Name:   Cynthia PELLEGRIN Date of Exam: 08/26/2022 Medical Rec #:  [id number]         Height:       58.0 in Accession #:    [id number]        Weight:       121.0 lb Date of Birth:  1928/03/07          BSA:          1.471 m Patient Age:    50 years          BP:  170/87 mmHg Patient Gender: F                 HR:           69 bpm. Exam Location:  Inpatient Procedure: 2D Echo Indications:    CHF  History:        Patient has prior history of Echocardiogram examinations, most                 recent 06/03/2022. CHF, CAD, Signs/Symptoms:Chest Pain; Risk                 Factors:Dyslipidemia and Hypertension.  Sonographer:    Harvie Junior Referring Phys: [id number] [place]  Sonographer Comments: Suboptimal parasternal window. Image acquisition challenging due to uncooperative patient and moving. IMPRESSIONS  1. Left ventricular ejection fraction, by estimation, is 30 to 35%. The left ventricle has moderately decreased function. The left ventricle demonstrates regional wall motion abnormalities with septal hypokinesis and septal-lateral dyssynchrony suggestive of LBBB. There is mild concentric left ventricular hypertrophy. Left ventricular diastolic parameters are consistent with Grade III diastolic dysfunction (restrictive).  2. Right ventricular systolic function is mildly reduced. The right ventricular size is normal. There is moderately elevated pulmonary artery systolic pressure. The estimated right ventricular systolic pressure is [id number] mmHg.  3. Left atrial size was moderately dilated.  4. The mitral valve is normal in structure. Trivial mitral valve regurgitation. No evidence of mitral stenosis.  5. The aortic valve is tricuspid.  There is moderate calcification of the aortic valve. Aortic valve regurgitation is trivial. No aortic stenosis is present.  6. Aortic dilatation noted. There is mild dilatation of the ascending aorta, measuring 39 mm.  7. The inferior vena cava is normal in size with greater than 50% respiratory variability, suggesting right atrial pressure of 3 mmHg. FINDINGS  Left Ventricle: Left ventricular ejection fraction, by estimation, is 30 to 35%. The left ventricle has moderately decreased function. The left ventricle demonstrates regional wall motion abnormalities. The left ventricular internal cavity size was normal in size. There is mild concentric left ventricular hypertrophy. Left ventricular diastolic parameters are consistent with Grade III diastolic dysfunction (restrictive). Right Ventricle: The right ventricular size is normal. No increase in right ventricular wall thickness. Right ventricular systolic function is mildly reduced. There is moderately elevated pulmonary artery systolic pressure. The tricuspid regurgitant velocity is 3.64 m/s, and with an assumed right atrial pressure of 3 mmHg, the estimated right ventricular systolic pressure is [id number] mmHg. Left Atrium: Left atrial size was moderately dilated. Right Atrium: Right atrial size was normal in size. Pericardium: There is no evidence of pericardial effusion. Mitral Valve: The mitral valve is normal in structure. There is mild calcification of the mitral valve leaflet(s). Mild mitral annular calcification. Trivial mitral valve regurgitation. No evidence of mitral valve stenosis. Tricuspid Valve: The tricuspid valve is normal in structure. Tricuspid valve regurgitation is trivial. Aortic Valve: The aortic valve is tricuspid. There is moderate calcification of the aortic valve. Aortic valve regurgitation is trivial. Aortic regurgitation PHT measures 380 msec. No aortic stenosis is present. Aortic valve mean gradient measures 4.0 mmHg. Aortic valve peak  gradient measures 7.4 mmHg. Aortic valve area, by VTI measures 2.24 cm. Pulmonic Valve: The pulmonic valve was normal in structure. Pulmonic valve regurgitation is trivial. Aorta: The aortic root is normal in size and structure and aortic dilatation noted. There is mild dilatation of the ascending aorta, measuring 39 mm. Venous: The inferior vena cava is normal in size with greater  than 50% respiratory variability, suggesting right atrial pressure of 3 mmHg. IAS/Shunts: No atrial level shunt detected by color flow Doppler.  LEFT VENTRICLE PLAX 2D LVIDd:         4.60 cm     Diastology LVIDs:         3.90 cm     LV e' medial:    2.83 cm/s LV PW:         0.90 cm     LV E/e' medial:  33.1 LV IVS:        0.80 cm     LV e' lateral:   8.81 cm/s LVOT diam:     2.10 cm     LV E/e' lateral: 10.6 LV SV:         57 LV SV Index:   39 LVOT Area:     3.46 cm  LV Volumes (MOD) LV vol d, MOD A2C: 81.2 ml LV vol d, MOD A4C: 76.9 ml LV vol s, MOD A2C: 62.0 ml LV vol s, MOD A4C: 62.9 ml LV SV MOD A2C:     19.2 ml LV SV MOD A4C:     76.9 ml LV SV MOD BP:      19.6 ml RIGHT VENTRICLE RV Basal diam:  3.30 cm RV Mid diam:    3.00 cm RV S prime:     9.36 cm/s TAPSE (M-mode): 1.9 cm LEFT ATRIUM           Index        RIGHT ATRIUM           Index LA diam:      3.20 cm 2.18 cm/m   RA Area:     11.70 cm LA Vol (A2C): 49.2 ml 33.45 ml/m  RA Volume:   25.90 ml  17.61 ml/m LA Vol (A4C): 31.3 ml 21.28 ml/m  AORTIC VALVE                    PULMONIC VALVE AV Area (Vmax):    1.77 cm     PV Vmax:          0.81 m/s AV Area (Vmean):   1.72 cm     PV Peak grad:     2.7 mmHg AV Area (VTI):     2.24 cm     PR End Diast Vel: 9.73 msec AV Vmax:           136.00 cm/s AV Vmean:          96.400 cm/s AV VTI:            0.254 m AV Peak Grad:      7.4 mmHg AV Mean Grad:      4.0 mmHg LVOT Vmax:         69.50 cm/s LVOT Vmean:        47.900 cm/s LVOT VTI:          0.164 m LVOT/AV VTI ratio: 0.65 AI PHT:            380 msec  AORTA Ao Root diam: 3.10 cm Ao  Asc diam:  3.90 cm MITRAL VALVE               TRICUSPID VALVE MV Area (PHT): 5.70 cm    TR Peak grad:   53.0 mmHg MV Decel Time: 133 msec    TR Vmax:        364.00 cm/s MR Peak grad: 81.6 mmHg MR Vmax:  451.75 cm/s  SHUNTS MV E velocity: 93.80 cm/s  Systemic VTI:  0.16 m MV A velocity: 37.70 cm/s  Systemic Diam: 2.10 cm MV E/A ratio:  2.49 Dalton McleanMD Electronically signed by Franki Monte Signature Date/Time: 08/26/2022/12:04:51 PM    Final    CT Angio Chest PE W and/or Wo Contrast  Result Date: 08/26/2022 CLINICAL DATA:  Shortness of breath and chest tightness for several days EXAM: CT ANGIOGRAPHY CHEST WITH CONTRAST TECHNIQUE: Multidetector CT imaging of the chest was performed using the standard protocol during bolus administration of intravenous contrast. Multiplanar CT image reconstructions and MIPs were obtained to evaluate the vascular anatomy. RADIATION DOSE REDUCTION: This exam was performed according to the departmental dose-optimization program which includes automated exposure control, adjustment of the mA and/or kV according to patient size and/or use of iterative reconstruction technique. CONTRAST:  38mL OMNIPAQUE IOHEXOL 350 MG/ML SOLN COMPARISON:  06/03/2022 FINDINGS: Cardiovascular: Atherosclerotic calcifications of the thoracic aorta are noted. No aneurysmal dilatation is seen. The pulmonary artery shows a normal branching pattern bilaterally. No focal filling defect to suggest pulmonary embolism is seen. Coronary calcifications are noted. Heart is at the upper limits of normal in size. Mediastinum/Nodes: Thoracic inlet is within normal limits. No hilar or mediastinal adenopathy is noted. The esophagus is within normal limits. Lungs/Pleura: Lungs are well aerated bilaterally with diffuse emphysematous changes. Bilateral pleural effusions are noted right greater than left with associated atelectatic change. No sizable parenchymal nodule is seen. No pneumothorax is noted. Chronic  subpleural fibrotic changes are noted as well. Upper Abdomen: No acute abnormality. Musculoskeletal: Degenerative changes of the thoracic spine are noted. Old rib fractures are seen with healing. No acute rib abnormality is noted. Review of the MIP images confirms the above findings. IMPRESSION: Bilateral pleural effusions right greater than left. No evidence of pulmonary emboli. Scattered atelectatic changes and chronic fibrotic changes. Aortic Atherosclerosis (ICD10-I70.0) and Emphysema (ICD10-J43.9). Electronically Signed   By: Inez Catalina M.D.   On: 08/26/2022 02:07   DG Chest 2 View  Result Date: 08/25/2022 CLINICAL DATA:  Shortness of breath. EXAM: CHEST - 2 VIEW COMPARISON:  July 07, 2022 FINDINGS: The cardiac silhouette is mildly enlarged. There is marked severity calcification of the thoracic aorta. Mild, diffuse, chronic appearing increased interstitial lung markings are seen. Mild right upper lobe scarring and/or atelectasis is noted. There are small bilateral pleural effusions. No pneumothorax is identified. The visualized skeletal structures are unremarkable. IMPRESSION: 1. Cardiomegaly and chronic appearing increased interstitial lung markings with mild right upper lobe scarring and/or atelectasis. 2. Small bilateral pleural effusions. Electronically Signed   By: Virgina Norfolk M.D.   On: 08/25/2022 20:34        Scheduled Meds:  apixaban  2.5 mg Oral BID   carvedilol  12.5 mg Oral BID WC   clopidogrel  75 mg Oral Daily   ezetimibe  10 mg Oral Daily   sacubitril-valsartan  1 tablet Oral BID   spironolactone  25 mg Oral Daily   Continuous Infusions:   LOS: 1 day    Time spent: 45 minutes spent on chart review, discussion with nursing staff, consultants, updating family and interview/physical exam; more than 50% of that time was spent in counseling and/or coordination of care.    Geradine Girt, DO [facility] Available via Epic secure chat 7am-7pm After these  hours, please refer to coverage provider listed on [email or web address] 08/27/2022, 12:15 PM

## 2022-08-27 NOTE — Telephone Encounter (Signed)
Pharmacy Patient Advocate Encounter  Insurance verification completed.    The patient is insured through [employer] Part D   The patient is currently admitted and ran test claims for the following: Eliquis, Xarelto, Entresto, Jardiance, Farxiga.  Copays and coinsurance results were relayed to Inpatient clinical team.

## 2022-08-27 NOTE — Discharge Instructions (Signed)
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
## 2022-08-27 NOTE — Progress Notes (Addendum)
Pt is confused and trying to pull her Purewick out. Family at bedside requesting that we avoid mittens or restraints. Family asking what medication can be used to help her calm down. Paged MD, awaiting order.

## 2022-08-27 NOTE — Progress Notes (Addendum)
Date and time results received: 08/27/22 0156   Test: potassium    Critical Value: 2.5   Name of Provider Notified: Dr. Myna Hidalgo  Orders Received? Or Actions Taken?: see new orders  MD notified of patient's inability/willingness to take the PO potassium at this time due to confusion/agitation.  IV runs started. PO potassium moved to the morning.

## 2022-08-27 NOTE — Evaluation (Signed)
Occupational Therapy Evaluation Patient Details Name: [name] MRN: [id number] DOB: [birth year] Today's Date: 08/27/2022   History of Present Illness Pt is a 88 y/o female admitted secondary to increased SOB and LE swelling. Thought to be secondary to CHF exacerbation. PMH includes CAD s/p PCI, asthma, and HTN.   Clinical Impression   Pt reports she walks with a walking stick outside of her home and no device inside. She endorses independence in self care and light meal prep. She is a retired Neurosurgeon. Pt pleasant and cooperative, presents with impaired cognition, generalized weakness and poor balance with posterior lean . She needs min to mod assist for bed mobility and mod assist to transfer bed <> BSC. Pt requires set up to max assist for ADLs. Daughter in room, but not participating in session. Will follow acutely, recommending 24 hour care at home and HHOT.      Recommendations for follow up therapy are one component of a multi-disciplinary discharge planning process, led by the attending physician.  Recommendations may be updated based on patient status, additional functional criteria and insurance authorization.   Follow Up Recommendations  Home health OT    Assistance Recommended at Discharge Frequent or constant Supervision/Assistance  Patient can return home with the following A lot of help with walking and/or transfers;A lot of help with bathing/dressing/bathroom;Assistance with cooking/housework;Direct supervision/assist for medications management;Direct supervision/assist for financial management;Assist for transportation;Help with stairs or ramp for entrance    Functional Status Assessment  Patient has had a recent decline in their functional status and demonstrates the ability to make significant improvements in function in a reasonable and predictable amount of time.  Equipment Recommendations  BSC/3in1    Recommendations for Other Services       Precautions /  Restrictions Precautions Precautions: Fall Restrictions Weight Bearing Restrictions: No      Mobility Bed Mobility Overal bed mobility: Needs Assistance Bed Mobility: Supine to Sit, Sit to Supine     Supine to sit: Min assist Sit to supine: Mod assist   General bed mobility comments: increased time, assist to raise trunk and for LEs back into bed    Transfers Overall transfer level: Needs assistance Equipment used: 1 person hand held assist Transfers: Sit to/from Stand, Bed to chair/wheelchair/BSC Sit to Stand: Mod assist Stand pivot transfers: Mod assist         General transfer comment: assist to rise and steady, posterior lean with feet slipping unless blocked, pt prefers shoes instead of gripper socks      Balance Overall balance assessment: Needs assistance   Sitting balance-Leahy Scale: Fair       Standing balance-Leahy Scale: Poor Standing balance comment: reliant on mod assist                           ADL either performed or assessed with clinical judgement   ADL Overall ADL's : Needs assistance/impaired Eating/Feeding: Set up;Sitting;Bed level   Grooming: Wash/dry hands;Wash/dry face;Sitting;Supervision/safety   Upper Body Bathing: Minimal assistance;Sitting   Lower Body Bathing: Maximal assistance;Sit to/from stand   Upper Body Dressing : Minimal assistance;Sitting   Lower Body Dressing: Maximal assistance;Sit to/from stand   Toilet Transfer: Moderate assistance;Stand-pivot;BSC/3in1   Toileting- Clothing Manipulation and Hygiene: Set up;Sitting/lateral lean         General ADL Comments: posterior bias     Vision Ability to See in Adequate Light: 0 Adequate Patient Visual Report: No change from baseline  Perception     Praxis      Pertinent Vitals/Pain Pain Assessment Pain Assessment: No/denies pain     Hand Dominance Right   Extremity/Trunk Assessment Upper Extremity Assessment Upper Extremity Assessment:  Generalized weakness (fine motor deficits likely baseline)   Lower Extremity Assessment Lower Extremity Assessment: Defer to PT evaluation   Cervical / Trunk Assessment Cervical / Trunk Assessment: Kyphotic   Communication Communication Communication: No difficulties   Cognition Arousal/Alertness: Awake/alert Behavior During Therapy: WFL for tasks assessed/performed Overall Cognitive Status: Impaired/Different from baseline Area of Impairment: Orientation, Following commands, Safety/judgement, Problem solving                 Orientation Level: Disoriented to, Place, Time   Memory: Decreased short-term memory Following Commands: Follows one step commands with increased time Safety/Judgement: Decreased awareness of deficits   Problem Solving: Slow processing, Decreased initiation, Requires verbal cues       General Comments       Exercises     Shoulder Instructions      Home Living Family/patient expects to be discharged to:: Private residence Living Arrangements: Other relatives Available Help at Discharge: Family;Available 24 hours/day Type of Home: House Home Access: Stairs to enter [employer] of Steps: 1 Entrance Stairs-Rails: None Home Layout: Laundry or work area in basement;Able to live on main level with bedroom/bathroom;Two level     Bathroom Shower/Tub: Producer, television/film/video: Standard     Home Equipment: Agricultural consultant (2 wheels);Cane - quad;Shower seat;Other (comment) (walking stick)          Prior Functioning/Environment Prior Level of Function : Independent/Modified Independent             Mobility Comments: normally uses walking stick outside her home, no AD inside ADLs Comments: showers infrequently, prepares simple meals        OT Problem List: Decreased strength;Decreased activity tolerance;Impaired balance (sitting and/or standing);Decreased coordination;Decreased cognition;Decreased knowledge of use of  DME or AE      OT Treatment/Interventions: Self-care/ADL training;Therapeutic activities;Cognitive remediation/compensation;Patient/family education;Balance training    OT Goals(Current goals can be found in the care plan section) Acute Rehab OT Goals OT Goal Formulation: With patient Time For Goal Achievement: 09/10/22 Potential to Achieve Goals: Good ADL Goals Pt Will Perform Grooming: with min assist;standing Pt Will Perform Upper Body Dressing: sitting;with supervision Pt Will Perform Lower Body Dressing: with min assist;sit to/from stand Pt Will Transfer to Toilet: with min assist;ambulating;regular height toilet;bedside commode Pt Will Perform Toileting - Clothing Manipulation and hygiene: with min assist;sit to/from stand Additional ADL Goal #1: Pt will perform bed mobility with supervision as a precursor to ADLs.  OT Frequency: Min 2X/week    Co-evaluation              AM-PAC OT "6 Clicks" Daily Activity     Outcome Measure Help from another person eating meals?: A Little Help from another person taking care of personal grooming?: A Little Help from another person toileting, which includes using toliet, bedpan, or urinal?: A Lot Help from another person bathing (including washing, rinsing, drying)?: A Lot Help from another person to put on and taking off regular upper body clothing?: A Little Help from another person to put on and taking off regular lower body clothing?: A Lot 6 Click Score: 15   End of Session Equipment Utilized During Treatment: Gait belt Nurse Communication: Mobility status  Activity Tolerance: Patient tolerated treatment well Patient left: in bed;with call bell/phone within reach;with bed  alarm set;with family/visitor present  OT Visit Diagnosis: Unsteadiness on feet (R26.81);Other abnormalities of gait and mobility (R26.89);Muscle weakness (generalized) (M62.81);Other symptoms and signs involving cognitive function                Time:  4037-0964 OT Time Calculation (min): 28 min Charges:  OT General Charges $OT Visit: 1 Visit OT Evaluation $OT Eval Moderate Complexity: 1 Mod OT Treatments $Self Care/Home Management : 8-22 mins  Cleta Alberts, OTR/L Acute Rehabilitation Services Office: [phone]  Malka So 08/27/2022, 10:47 AM

## 2022-08-27 NOTE — Plan of Care (Signed)
  Problem: Cardiac: Goal: Ability to achieve and maintain adequate cardiopulmonary perfusion will improve Outcome: Progressing   Problem: Clinical Measurements: Goal: Diagnostic test results will improve Outcome: Progressing Goal: Respiratory complications will improve Outcome: Progressing Goal: Cardiovascular complication will be avoided Outcome: Progressing   Problem: Coping: Goal: Level of anxiety will decrease Outcome: Progressing   Problem: Elimination: Goal: Will not experience complications related to urinary retention Outcome: Progressing   Problem: Pain Managment: Goal: General experience of comfort will improve Outcome: Progressing   Problem: Safety: Goal: Ability to remain free from injury will improve Outcome: Progressing

## 2022-08-27 NOTE — Plan of Care (Signed)
  Problem: Clinical Measurements: Goal: Respiratory complications will improve Outcome: Progressing Goal: Cardiovascular complication will be avoided Outcome: Progressing   Problem: Coping: Goal: Level of anxiety will decrease Outcome: Progressing   Problem: Elimination: Goal: Will not experience complications related to urinary retention Outcome: Progressing   Problem: Pain Managment: Goal: General experience of comfort will improve Outcome: Progressing   Problem: Activity: Goal: Risk for activity intolerance will decrease Outcome: Not Progressing   Problem: Nutrition: Goal: Adequate nutrition will be maintained Outcome: Not Progressing

## 2022-08-27 NOTE — Progress Notes (Addendum)
Heart Failure Stewardship Pharmacist Progress Note   PCP: Nche, Charlene Brooke, NP PCP-Cardiologist: [name], MD    HPI:  88 yo F with PMH of CAD s/p PCI, CHF, asthma, GERD, HTN, and HLD.  Recently admitted from 7/11-7/15 with syncopal episode likely due to orthostatic hypotension. ECHO at that time with LVEF 55-60%, mild LVH, RV normal. Discharged on midodrine and HCTZ stopped.   Presented to the ED on 10/3 with shortness of breath, HTN, hypoxia, LE edema, and near syncope. CXR with cardiomegaly, chronic appearing increased interstitial lung markings, and small bilateral pleural effusions. CTA negative for PE. ECHO 10/4 with newly reduced LVEF 30-35%, mild LVH with G3DD, RV mildly reduced.  Volume status improved, reported that she is still dizzy upon sitting/standing.  Current HF Medications: Beta Blocker: carvedilol 12.5 mg BID ACE/ARB/ARNI: Entresto 24/26 mg BID MRA: spironolactone 25 mg daily  Prior to admission HF Medications: ACE/ARB/ARNI: losartan 12.5 mg daily *also taking midodrine 10 mg TID for orthostatic hypotension  Pertinent Lab Values: Serum creatinine 1.00, BUN 17, Potassium 2.5, Sodium 133, BNP 2352.3, Magnesium 1.8 (replaced with 1g IV)  Vital Signs: Weight: 112 lbs (admission weight: 121 lbs) Blood pressure: 150-170/80s Heart rate: 60-70s  I/O: incomplete  Medication Assistance / Insurance Benefits Check: Does the patient have prescription insurance?  Yes Type of insurance plan: UHC Medicare  Does the patient qualify for medication assistance through manufacturers or grants?   Yes Eligible grants and/or patient assistance programs: Lisabeth Register Medication assistance applications in progress: Lindi Adie  Medication assistance applications approved: none Approved medication assistance renewals will be completed by: pending  Outpatient Pharmacy:  Prior to admission outpatient pharmacy: [facility] Is the patient willing to use [facility] at  discharge? Yes Is the patient willing to transition their outpatient pharmacy to utilize a [facility] outpatient pharmacy?   Pending    Assessment: 1. Acute on chronic systolic and diastolic CHF (LVEF 77-82%). NYHA class III symptoms. - Off IV lasix. Volume improved. Strict I/Os and daily weights. KCl IV 10 mEq x 4 and PO 40 mEq x 1 given for replacement. Keep K>4 and Mag>2. Recheck magnesium and BMET at noon. - Continue carvedilol 12.5 mg BID - Continue Entresto 24/26 mg BID - Agree with adding spironolactone 25 mg daily - Consider adding Farxiga 10 mg daily prior to discharge. UA completed. No urinary symptoms reported.  - Suspected that midodrine will not need to be continued, orthostatic symptoms may be related to poor oral intake/malnutrition  Plan: 1) Medication changes recommended at this time: - Start Farxiga 10 mg daily tomorrow if SCr stable  2) Patient assistance: - Currently in coverage gap (donut hole) - Initial Farxiga copay $99 - Initial Jardiance copay $110 - Patient assistance initiated for [facility], patient signed for [place] assistance in case this is added before discharge  3)  Education  - Patient and family have been educated on current HF medications and potential additions to HF medication regimen - Patient verbalizes understanding that over the next few months, these medication doses may change and more medications may be added to optimize HF regimen - Patient and family have been educated on basic disease state pathophysiology and goals of therapy   Kerby Nora, PharmD, BCPS Heart Failure Cytogeneticist Phone [phone]

## 2022-08-27 NOTE — Progress Notes (Signed)
Physical Therapy Treatment Patient Details Name: [name] MRN: [id number] DOB: 04/11/1928 Today's Date: 08/27/2022   History of Present Illness Pt is a 88 y.o. female admitted 08/25/22 with SOB, chest tightness, BLE swelling. Workup for CHF exacerbation, hypertensive urgency. Chest CTA showing bilateral pleural effusions. PMH includes CAD s/p PCI, HTN, asthma.   PT Comments    Pt progressing with mobility. Today's session focused on transfer training with RW, pt able to take pivotal steps from bed<>BSC and walk ~2' with RW before needing rest break. Pt's great-granddaughter present and supportive, she reports confidence that family able to provide necessary assist at home; pt would benefit from w/c due to limited ambulation distance. Pt remains limited by generalized weakness, decreased activity tolerance, and impaired balance strategies/postural reactions. Will continue to follow acutely to address established goals.  SpO2 93% on RA    Recommendations for follow up therapy are one component of a multi-disciplinary discharge planning process, led by the attending physician.  Recommendations may be updated based on patient status, additional functional criteria and insurance authorization.  Follow Up Recommendations  Home health PT     Assistance Recommended at Discharge Frequent or constant Supervision/Assistance  Patient can return home with the following A lot of help with walking and/or transfers;A lot of help with bathing/dressing/bathroom;Assistance with cooking/housework;Assist for transportation;Help with stairs or ramp for entrance   Equipment Recommendations  Wheelchair (measurements PT);Wheelchair cushion (measurements PT)    Recommendations for Other Services       Precautions / Restrictions Precautions Precautions: Fall Restrictions Weight Bearing Restrictions: No     Mobility  Bed Mobility Overal bed mobility: Needs Assistance Bed Mobility: Supine to Sit      Supine to sit: Min assist, HOB elevated Sit to supine: Min assist   General bed mobility comments: increased time, minA for HHA to elevate trunk and scoot hips to EOB; minA for LE management return to supine    Transfers Overall transfer level: Needs assistance Equipment used: Rolling walker (2 wheels), 1 person hand held assist Transfers: Sit to/from Stand, Bed to chair/wheelchair/BSC Sit to Stand: Mod assist, Min assist   Step pivot transfers: Mod assist       General transfer comment: initial modA to stand from EOB to RW and step pivot bed<>BSC; pt walking ~2' forwards with modA then step back to bed; minA for additional sit<>stand from EOB and side steps towards [facility] with HHA    Ambulation/Gait               General Gait Details: limited by fatigue   Stairs             Wheelchair Mobility    Modified Rankin (Stroke Patients Only)       Balance Overall balance assessment: Needs assistance Sitting-balance support: No upper extremity supported, Feet supported Sitting balance-Leahy Scale: Fair     Standing balance support: Bilateral upper extremity supported, Single extremity supported Standing balance-Leahy Scale: Poor Standing balance comment: reliant on UE support and external assist                            Cognition Arousal/Alertness: Awake/alert Behavior During Therapy: WFL for tasks assessed/performed Overall Cognitive Status: Impaired/Different from baseline Area of Impairment: Orientation, Following commands, Safety/judgement, Problem solving, Memory, Attention, Awareness                 Orientation Level: Disoriented to Current Attention Level: Sustained, Selective Memory: Decreased short-term  memory Following Commands: Follows one step commands with increased time Safety/Judgement: Decreased awareness of deficits Awareness: Intellectual Problem Solving: Slow processing, Decreased initiation, Requires verbal  cues General Comments: pleasant and cooperative. difficult to determine true cognitive impairment vs [facility]        Exercises      General Comments General comments (skin integrity, edema, etc.): pt's great-granddaughter present and supportive, used to work as Quarry manager and reports confidence that she and family can provide necessary level of assist for pt. discussed home set-up and DME needs (hoping to have family build ramp), interested in w/c for home and community mobility. SpO2 93% on RA      Pertinent Vitals/Pain Pain Assessment Pain Assessment: No/denies pain    Home Living                          Prior Function            PT Goals (current goals can now be found in the care plan section) Progress towards PT goals: Progressing toward goals    Frequency    Min 3X/week      PT Plan Current plan remains appropriate;Equipment recommendations need to be updated    Co-evaluation              AM-PAC PT "6 Clicks" Mobility   Outcome Measure  Help needed turning from your back to your side while in a flat bed without using bedrails?: A Little Help needed moving from lying on your back to sitting on the side of a flat bed without using bedrails?: A Lot Help needed moving to and from a bed to a chair (including a wheelchair)?: A Lot Help needed standing up from a chair using your arms (e.g., wheelchair or bedside chair)?: A Lot Help needed to walk in hospital room?: Total Help needed climbing 3-5 steps with a railing? : Total 6 Click Score: 11    End of Session Equipment Utilized During Treatment: Gait belt Activity Tolerance: Patient tolerated treatment well;Patient limited by fatigue Patient left: in bed;with call bell/phone within reach;with family/visitor present Nurse Communication: Mobility status PT Visit Diagnosis: Unsteadiness on feet (R26.81);Muscle weakness (generalized) (M62.81);Difficulty in walking, not elsewhere classified (R26.2)     Time:  [id number] PT Time Calculation (min) (ACUTE ONLY): 20 min  Charges:  $Therapeutic Activity: 8-22 mins                     Mabeline Caras, PT, DPT Acute Rehabilitation Services  Personal: [facility] Office: [place] 08/27/2022, 3:32 PM

## 2022-08-27 NOTE — Progress Notes (Addendum)
Rounding Note    Patient Name: [name] Date of Encounter: 08/27/2022  [place] Cardiologist: [name], MD    Subjective   88 yo with hx of chronic combined CHF, LBBB,   Inpatient Medications    Scheduled Meds:  aspirin EC  81 mg Oral Daily   carvedilol  12.5 mg Oral BID WC   clopidogrel  75 mg Oral Daily   enoxaparin (LOVENOX) injection  40 mg Subcutaneous Q24H   ezetimibe  10 mg Oral Daily   furosemide  40 mg Intravenous BID   potassium chloride  40 mEq Oral Once   sacubitril-valsartan  1 tablet Oral BID   Continuous Infusions:  potassium chloride 10 mEq (08/27/22 0837)   PRN Meds: acetaminophen **OR** acetaminophen, albuterol, hydrALAZINE, metoprolol tartrate   Vital Signs    Vitals:   08/27/22 0317 08/27/22 0320 08/27/22 0726 08/27/22 0800  BP:  123/70 (!) 152/84   Pulse:    100  Resp:  17 11   Temp:  98.6 F (37 C) 97.7 F (36.5 C)   TempSrc:  Oral    SpO2:  93%  93%  Weight: 51.1 kg     Height:        Intake/Output Summary (Last 24 hours) at 08/27/2022 0919 Last data filed at 08/27/2022 0647 Gross per 24 hour  Intake 326.01 ml  Output 650 ml  Net -323.99 ml      08/27/2022    3:17 AM 08/26/2022    8:00 PM 08/25/2022    6:59 PM  Last 3 Weights  Weight (lbs) 112 lb 10.5 oz 115 lb 11.9 oz 121 lb  Weight (kg) 51.1 kg 52.5 kg 54.885 kg      Telemetry     - Personally Reviewed  ECG     NSR - Personally Reviewed  Physical Exam   GEN: elderly female,  NAD   Neck: No JVD Cardiac: RRR, no murmurs, rubs, or gallops.  Respiratory: Clear to auscultation bilaterally. GI: Soft, nontender, non-distended  MS: No edema; No deformity. Neuro:  Nonfocal  Psych: Normal affect   Labs    High Sensitivity Troponin:   Recent Labs  Lab 08/25/22 1918 08/26/22 0040  TROPONINIHS 24* 25*     Chemistry Recent Labs  Lab 08/25/22 1918 08/26/22 0554 08/27/22 0028  NA 135 137 133*  K 4.1 4.0 2.5*  CL 100 94* 90*  CO2 24  28 28   GLUCOSE 111* 114* 130*  BUN 16 15 17   CREATININE 0.79 0.81 1.00  CALCIUM 9.5 10.3 9.5  MG  --  1.8  --   PROT 6.8  --   --   ALBUMIN 3.5  --   --   AST 33  --   --   ALT 22  --   --   ALKPHOS 70  --   --   BILITOT 0.9  --   --   GFRNONAA >60 >60 53*  ANIONGAP 11 15 15     Lipids No results for input(s): "CHOL", "TRIG", "HDL", "LABVLDL", "LDLCALC", "CHOLHDL" in the last 168 hours.  Hematology Recent Labs  Lab 08/25/22 1918 08/27/22 0028  WBC 7.9 7.6  RBC 3.73* 4.26  HGB 11.8* 13.1  HCT 36.5 39.0  MCV 97.9 91.5  MCH 31.6 30.8  MCHC 32.3 33.6  RDW 14.0 14.0  PLT 207 236   Thyroid No results for input(s): "TSH", "FREET4" in the last 168 hours.  BNP Recent Labs  Lab 08/25/22 1918  BNP 2,352.3*    DDimer No results for input(s): "DDIMER" in the last 168 hours.   Radiology    ECHOCARDIOGRAM COMPLETE  Result Date: 08/26/2022    ECHOCARDIOGRAM REPORT   Patient Name:   [name] Date of Exam: 08/26/2022 Medical Rec #:  [id number]         Height:       58.0 in Accession #:    [id number]        Weight:       121.0 lb Date of Birth:  [birth year]          BSA:          1.471 m Patient Age:    79 years          BP:           170/87 mmHg Patient Gender: F                 HR:           69 bpm. Exam Location:  Inpatient Procedure: 2D Echo Indications:    CHF  History:        Patient has prior history of Echocardiogram examinations, most                 recent 06/03/2022. CHF, CAD, Signs/Symptoms:Chest Pain; Risk                 Factors:Dyslipidemia and Hypertension.  Sonographer:    Harvie Junior Referring Phys: [id number] [place]  Sonographer Comments: Suboptimal parasternal window. Image acquisition challenging due to uncooperative patient and moving. IMPRESSIONS  1. Left ventricular ejection fraction, by estimation, is 30 to 35%. The left ventricle has moderately decreased function. The left ventricle demonstrates regional wall motion abnormalities with septal hypokinesis  and septal-lateral dyssynchrony suggestive of LBBB. There is mild concentric left ventricular hypertrophy. Left ventricular diastolic parameters are consistent with Grade III diastolic dysfunction (restrictive).  2. Right ventricular systolic function is mildly reduced. The right ventricular size is normal. There is moderately elevated pulmonary artery systolic pressure. The estimated right ventricular systolic pressure is [id number] mmHg.  3. Left atrial size was moderately dilated.  4. The mitral valve is normal in structure. Trivial mitral valve regurgitation. No evidence of mitral stenosis.  5. The aortic valve is tricuspid. There is moderate calcification of the aortic valve. Aortic valve regurgitation is trivial. No aortic stenosis is present.  6. Aortic dilatation noted. There is mild dilatation of the ascending aorta, measuring 39 mm.  7. The inferior vena cava is normal in size with greater than 50% respiratory variability, suggesting right atrial pressure of 3 mmHg. FINDINGS  Left Ventricle: Left ventricular ejection fraction, by estimation, is 30 to 35%. The left ventricle has moderately decreased function. The left ventricle demonstrates regional wall motion abnormalities. The left ventricular internal cavity size was normal in size. There is mild concentric left ventricular hypertrophy. Left ventricular diastolic parameters are consistent with Grade III diastolic dysfunction (restrictive). Right Ventricle: The right ventricular size is normal. No increase in right ventricular wall thickness. Right ventricular systolic function is mildly reduced. There is moderately elevated pulmonary artery systolic pressure. The tricuspid regurgitant velocity is 3.64 m/s, and with an assumed right atrial pressure of 3 mmHg, the estimated right ventricular systolic pressure is [id number] mmHg. Left Atrium: Left atrial size was moderately dilated. Right Atrium: Right atrial size was normal in size. Pericardium: There is no evidence  of pericardial effusion. Mitral Valve: The mitral valve is  normal in structure. There is mild calcification of the mitral valve leaflet(s). Mild mitral annular calcification. Trivial mitral valve regurgitation. No evidence of mitral valve stenosis. Tricuspid Valve: The tricuspid valve is normal in structure. Tricuspid valve regurgitation is trivial. Aortic Valve: The aortic valve is tricuspid. There is moderate calcification of the aortic valve. Aortic valve regurgitation is trivial. Aortic regurgitation PHT measures 380 msec. No aortic stenosis is present. Aortic valve mean gradient measures 4.0 mmHg. Aortic valve peak gradient measures 7.4 mmHg. Aortic valve area, by VTI measures 2.24 cm. Pulmonic Valve: The pulmonic valve was normal in structure. Pulmonic valve regurgitation is trivial. Aorta: The aortic root is normal in size and structure and aortic dilatation noted. There is mild dilatation of the ascending aorta, measuring 39 mm. Venous: The inferior vena cava is normal in size with greater than 50% respiratory variability, suggesting right atrial pressure of 3 mmHg. IAS/Shunts: No atrial level shunt detected by color flow Doppler.  LEFT VENTRICLE PLAX 2D LVIDd:         4.60 cm     Diastology LVIDs:         3.90 cm     LV e' medial:    2.83 cm/s LV PW:         0.90 cm     LV E/e' medial:  33.1 LV IVS:        0.80 cm     LV e' lateral:   8.81 cm/s LVOT diam:     2.10 cm     LV E/e' lateral: 10.6 LV SV:         57 LV SV Index:   39 LVOT Area:     3.46 cm  LV Volumes (MOD) LV vol d, MOD A2C: 81.2 ml LV vol d, MOD A4C: 76.9 ml LV vol s, MOD A2C: 62.0 ml LV vol s, MOD A4C: 62.9 ml LV SV MOD A2C:     19.2 ml LV SV MOD A4C:     76.9 ml LV SV MOD BP:      19.6 ml RIGHT VENTRICLE RV Basal diam:  3.30 cm RV Mid diam:    3.00 cm RV S prime:     9.36 cm/s TAPSE (M-mode): 1.9 cm LEFT ATRIUM           Index        RIGHT ATRIUM           Index LA diam:      3.20 cm 2.18 cm/m   RA Area:     11.70 cm LA Vol (A2C): 49.2 ml  33.45 ml/m  RA Volume:   25.90 ml  17.61 ml/m LA Vol (A4C): 31.3 ml 21.28 ml/m  AORTIC VALVE                    PULMONIC VALVE AV Area (Vmax):    1.77 cm     PV Vmax:          0.81 m/s AV Area (Vmean):   1.72 cm     PV Peak grad:     2.7 mmHg AV Area (VTI):     2.24 cm     PR End Diast Vel: 9.73 msec AV Vmax:           136.00 cm/s AV Vmean:          96.400 cm/s AV VTI:            0.254 m AV Peak Grad:  7.4 mmHg AV Mean Grad:      4.0 mmHg LVOT Vmax:         69.50 cm/s LVOT Vmean:        47.900 cm/s LVOT VTI:          0.164 m LVOT/AV VTI ratio: 0.65 AI PHT:            380 msec  AORTA Ao Root diam: 3.10 cm Ao Asc diam:  3.90 cm MITRAL VALVE               TRICUSPID VALVE MV Area (PHT): 5.70 cm    TR Peak grad:   53.0 mmHg MV Decel Time: 133 msec    TR Vmax:        364.00 cm/s MR Peak grad: 81.6 mmHg MR Vmax:      451.75 cm/s  SHUNTS MV E velocity: 93.80 cm/s  Systemic VTI:  0.16 m MV A velocity: 37.70 cm/s  Systemic Diam: 2.10 cm MV E/A ratio:  2.49 Dalton McleanMD Electronically signed by Franki Monte Signature Date/Time: 08/26/2022/12:04:51 PM    Final    CT Angio Chest PE W and/or Wo Contrast  Result Date: 08/26/2022 CLINICAL DATA:  Shortness of breath and chest tightness for several days EXAM: CT ANGIOGRAPHY CHEST WITH CONTRAST TECHNIQUE: Multidetector CT imaging of the chest was performed using the standard protocol during bolus administration of intravenous contrast. Multiplanar CT image reconstructions and MIPs were obtained to evaluate the vascular anatomy. RADIATION DOSE REDUCTION: This exam was performed according to the departmental dose-optimization program which includes automated exposure control, adjustment of the mA and/or kV according to patient size and/or use of iterative reconstruction technique. CONTRAST:  26mL OMNIPAQUE IOHEXOL 350 MG/ML SOLN COMPARISON:  06/03/2022 FINDINGS: Cardiovascular: Atherosclerotic calcifications of the thoracic aorta are noted. No aneurysmal dilatation  is seen. The pulmonary artery shows a normal branching pattern bilaterally. No focal filling defect to suggest pulmonary embolism is seen. Coronary calcifications are noted. Heart is at the upper limits of normal in size. Mediastinum/Nodes: Thoracic inlet is within normal limits. No hilar or mediastinal adenopathy is noted. The esophagus is within normal limits. Lungs/Pleura: Lungs are well aerated bilaterally with diffuse emphysematous changes. Bilateral pleural effusions are noted right greater than left with associated atelectatic change. No sizable parenchymal nodule is seen. No pneumothorax is noted. Chronic subpleural fibrotic changes are noted as well. Upper Abdomen: No acute abnormality. Musculoskeletal: Degenerative changes of the thoracic spine are noted. Old rib fractures are seen with healing. No acute rib abnormality is noted. Review of the MIP images confirms the above findings. IMPRESSION: Bilateral pleural effusions right greater than left. No evidence of pulmonary emboli. Scattered atelectatic changes and chronic fibrotic changes. Aortic Atherosclerosis (ICD10-I70.0) and Emphysema (ICD10-J43.9). Electronically Signed   By: Inez Catalina M.D.   On: 08/26/2022 02:07   DG Chest 2 View  Result Date: 08/25/2022 CLINICAL DATA:  Shortness of breath. EXAM: CHEST - 2 VIEW COMPARISON:  July 07, 2022 FINDINGS: The cardiac silhouette is mildly enlarged. There is marked severity calcification of the thoracic aorta. Mild, diffuse, chronic appearing increased interstitial lung markings are seen. Mild right upper lobe scarring and/or atelectasis is noted. There are small bilateral pleural effusions. No pneumothorax is identified. The visualized skeletal structures are unremarkable. IMPRESSION: 1. Cardiomegaly and chronic appearing increased interstitial lung markings with mild right upper lobe scarring and/or atelectasis. 2. Small bilateral pleural effusions. Electronically Signed   By: Virgina Norfolk M.D.    On: 08/25/2022 20:34  Cardiac Studies      Patient Profile     88 y.o. female  with LBBB , acute on chronic combined CHF, dizziness, orthostasis   Assessment & Plan        Acute on chronic combined CHF:   started on entresto yesterday ,  coreg. Plan is to add in spironolactone today  EF is 30-35%.   We have not been able to maintain guideline directed medical therapy due to dizziness, frequent falls.  My plan is to keep her here long enough to titrate up her meds and insure that she is stable before DC   2. LBBB :   stable.   Her EF has fallen since July.   Will continue GDMT and see if her EF will improve.      3.   Atrial fib:   Jullia developed atrial fib with RVR last night.  Documented with 12 lead ECG  Has converted back to NSR Likely due to her CHF. Will start eliquis Age is 53, wt is 51 kg Will start eliquis 2.5 bid       For questions or updates, please contact [place] Please consult [email or web address] for contact info under        Signed, [name], MD  08/27/2022, 9:19 AM

## 2022-08-27 NOTE — TOC Benefit Eligibility Note (Signed)
Patient Teacher, English as a foreign language completed.    The patient is currently admitted and upon discharge could be taking Eliquis 5 mg.  The current 30 day co-pay is $98.59 .due to being in Coverage Gap (donut hole)   The patient is currently admitted and upon discharge could be taking Xarelto 20 mg.  The current 30 day co-pay is $93.71 due to being in Coverage Gap (donut hole).   The patient is currently admitted and upon discharge could be taking Entresto 24-26 mg.  The current 30 day co-pay is $127.23 due to being in Coverage Gap (donut hole).   The patient is currently admitted and upon discharge could be taking Farxiga 10 mg.  The current 30 day co-pay is $99.81 due to being in Coverage Gap (donut hole).   The patient is currently admitted and upon discharge could be taking Jardiance 10 mg.  The current 30 day co-pay is $107.28 due to being in Coverage Gap (donut hole)  The patient is insured through [place], [place] Patient Advocate Specialist [place] Patient Advocate Team Direct Number: [phone]  Fax: [phone]

## 2022-08-28 ENCOUNTER — Other Ambulatory Visit (HOSPITAL_COMMUNITY): Payer: Self-pay

## 2022-08-28 DIAGNOSIS — I160 Hypertensive urgency: Secondary | ICD-10-CM | POA: Diagnosis not present

## 2022-08-28 DIAGNOSIS — I447 Left bundle-branch block, unspecified: Secondary | ICD-10-CM | POA: Diagnosis not present

## 2022-08-28 DIAGNOSIS — I2510 Atherosclerotic heart disease of native coronary artery without angina pectoris: Secondary | ICD-10-CM | POA: Diagnosis not present

## 2022-08-28 DIAGNOSIS — J90 Pleural effusion, not elsewhere classified: Secondary | ICD-10-CM | POA: Diagnosis not present

## 2022-08-28 DIAGNOSIS — I5041 Acute combined systolic (congestive) and diastolic (congestive) heart failure: Secondary | ICD-10-CM | POA: Diagnosis not present

## 2022-08-28 LAB — BASIC METABOLIC PANEL
Anion gap: 8 (ref 5–15)
BUN: 19 mg/dL (ref 8–23)
CO2: 25 mmol/L (ref 22–32)
Calcium: 9 mg/dL (ref 8.9–10.3)
Chloride: 96 mmol/L — ABNORMAL LOW (ref 98–111)
Creatinine, Ser: 0.75 mg/dL (ref 0.44–1.00)
GFR, Estimated: >60 mL/min (ref >60)
Glucose, Bld: 120 mg/dL — ABNORMAL HIGH (ref 70–99)
Potassium: 4.7 mmol/L (ref 3.5–5.1)
Sodium: 129 mmol/L — ABNORMAL LOW (ref 135–145)

## 2022-08-28 LAB — CBC
HCT: 38.4 % (ref 36.0–46.0)
Hemoglobin: 13.1 g/dL (ref 12.0–15.0)
MCH: 31.1 pg (ref 26.0–34.0)
MCHC: 34.1 g/dL (ref 30.0–36.0)
MCV: 91.2 fL (ref 80.0–100.0)
Platelets: 219 10*3/uL (ref 150–400)
RBC: 4.21 MIL/uL (ref 3.87–5.11)
RDW: 13.9 % (ref 11.5–15.5)
WBC: 6.9 10*3/uL (ref 4.0–10.5)
nRBC: 0 % (ref 0.0–0.2)

## 2022-08-28 LAB — CK: Total CK: 246 U/L — ABNORMAL HIGH (ref 38–234)

## 2022-08-28 MED ORDER — HALOPERIDOL LACTATE 5 MG/ML IJ SOLN
2.0000 mg | Freq: Four times a day (QID) | INTRAMUSCULAR | Status: DC | PRN
  Filled 2022-08-28: qty 1

## 2022-08-28 MED ORDER — APIXABAN 2.5 MG PO TABS
2.5000 mg | ORAL_TABLET | Freq: Two times a day (BID) | ORAL | 1 refills | Status: DC
  Filled 2022-08-28 – 2022-09-24 (×2): qty 60, 30d supply, fill #0

## 2022-08-28 MED ORDER — DAPAGLIFLOZIN PROPANEDIOL 10 MG PO TABS
10.0000 mg | ORAL_TABLET | Freq: Every day | ORAL | 1 refills | Status: DC
  Filled 2022-08-28: qty 30, 30d supply, fill #0

## 2022-08-28 MED ORDER — SACUBITRIL-VALSARTAN 24-26 MG PO TABS
1.0000 | ORAL_TABLET | Freq: Two times a day (BID) | ORAL | 1 refills | Status: DC
  Filled 2022-08-28: qty 60, 30d supply, fill #0

## 2022-08-28 MED ORDER — FAMOTIDINE 20 MG PO TABS
20.0000 mg | ORAL_TABLET | Freq: Every day | ORAL | Status: DC
  Administered 2022-08-28 – 2022-09-01 (×5): 20 mg via ORAL
  Filled 2022-08-28 (×5): qty 1

## 2022-08-28 MED ORDER — DAPAGLIFLOZIN PROPANEDIOL 10 MG PO TABS
10.0000 mg | ORAL_TABLET | Freq: Every day | ORAL | Status: DC
  Administered 2022-08-28 – 2022-08-29 (×2): 10 mg via ORAL
  Filled 2022-08-28 (×2): qty 1

## 2022-08-28 MED ORDER — SPIRONOLACTONE 25 MG PO TABS
25.0000 mg | ORAL_TABLET | Freq: Every day | ORAL | 1 refills | Status: DC
  Filled 2022-08-28 – 2022-09-24 (×2): qty 30, 30d supply, fill #0

## 2022-08-28 NOTE — Progress Notes (Signed)
PROGRESS NOTE    Isioma Buggs Rex  [id number] DOB: 20-Aug-1928 DOA: 08/25/2022 PCP: [name], NP    Brief Narrative:  Telitha Standiford Locatelli is a 88 y.o. female with medical history significant of CAD status post PCI, HFpEF, asthma, GERD, hypertension, hyperlipidemia.  Admitted in July 2023 for syncope secondary to orthostatic hypotension.  She was started on midodrine and hydrochlorothiazide discontinued.  She presents to the ED complaining of exertional dyspnea and chest tightness, near syncope, and bilateral lower extremity edema.  Hypertensive with systolic in the [id number] to [id number].  Desatted to 30s with ambulation.  Labs showing no leukocytosis, hemoglobin 11.8 (no significant change from baseline), creatinine 0.7, troponin 24> 25, BNP 2352.  UA with negative nitrite, small amount of leukocytes, and microscopy showing 6-10 WBCs and no bacteria.  CTA chest negative for PE.  Showing bilateral pleural effusions. Patient was given aspirin, IV Lasix 40 mg, and IV hydralazine 10 mg.   Patient is reporting 3-day history of exertional dyspnea and chest tightness.  Every time she tries to walk, she feels lightheaded and feels like she will pass out.  No loss of consciousness reported.  She is also reporting bilateral lower extremity edema.  Daughter is concerned that when they checked her oxygen saturation at home after she walked, it was as low as 50s to 60s.  She is taking all of her home antihypertensives and is also on midodrine due to history of orthostatic hypotension.  Patient denies any chest pain at this time.  No other complaints.    Assessment and Plan: Acute on chronic diastolic CHF with Acute hypoxemic respiratory failure Not hypoxic at rest but oxygen saturation dropped to the 80s with ambulation.  BNP 2352.  CTA chest negative for PE; showing bilateral pleural effusions. Echo done in July 2023 showing EF 55 to 123456, grade 1 diastolic dysfunction, mild MR, severe thickening of the aortic  valve without evidence of stenosis.  Now EF 30%,  She is not on diuretics at home.   Meds per cards -Monitor intake and output -Daily weights -Low-sodium diet with fluid restriction (eats a lot of fast foods at home) -per cards   Chest pain/CAD with multiple stents CTA chest negative for PE.  She does have a new left bundle branch block on EKG.  Troponin mildly elevated in the setting of acute CHF but stable (24> 25) and not consistent with ACS.  Currently chest pain-free and appears comfortable. LHC done April 2022   -cards following  Hypokalemia -replete  Episode of a fib -rate controlled -add eliquis  Hypertensive urgency Blood pressure remains elevated with systolic in the [id number].  Cardiology recommending continuing home meds, diuresis with IV Lasix, and IV hydralazine prn.  -per cards   Asthma Stable, no signs of acute exacerbation. -Continue albuterol as needed   Hyperlipidemia -Continue Zetia   Abnormal urinalysis UA with negative nitrite, small amount of leukocytes, and microscopy showing 6-10 WBCs and no bacteria.  Patient is not endorsing any urinary symptoms.   DVT prophylaxis: apixaban (ELIQUIS) tablet 2.5 mg Start: 08/27/22 1245 apixaban (ELIQUIS) tablet 2.5 mg    Code Status: DNR Family Communication: daughter at bedside; called granddaughter  Disposition Plan:  Level of care: Progressive Status is: Inpatient Remains inpatient appropriate because: needs diuresis    Consultants:  cards   Subjective: Had some delirium last night Coughing at night  Objective: Vitals:   08/27/22 1938 08/27/22 2334 08/28/22 0301 08/28/22 0413  BP: 130/71 (!) 141/72 (!) 148/88  Pulse: 63 63 78   Resp: 16 16 20    Temp: 97.7 F (36.5 C) 98.4 F (36.9 C) 97.6 F (36.4 C)   TempSrc: Oral Oral Oral   SpO2: 91% 93% 94%   Weight:    53.1 kg  Height:        Intake/Output Summary (Last 24 hours) at 08/28/2022 1231 Last data filed at 08/28/2022 [id number] Gross per 24 hour   Intake 600 ml  Output 150 ml  Net 450 ml   Filed Weights   08/26/22 2000 08/27/22 0317 08/28/22 0413  Weight: 52.5 kg 51.1 kg 53.1 kg    Examination:   General: Appearance:    elderly female in no acute distress     Lungs:     respirations unlabored  Heart:    Normal heart rate.   MS:   All extremities are intact.   Neurologic:   Awake, alert       Data Reviewed: I have personally reviewed following labs and imaging studies  CBC: Recent Labs  Lab 08/25/22 1918 08/27/22 0028 08/28/22 0012  WBC 7.9 7.6 6.9  NEUTROABS 4.9  --   --   HGB 11.8* 13.1 13.1  HCT 36.5 39.0 38.4  MCV 97.9 91.5 91.2  PLT 207 236 [id number]   Basic Metabolic Panel: Recent Labs  Lab 08/25/22 1918 08/26/22 0554 08/27/22 0028 08/27/22 1145 08/28/22 0012  NA 135 137 133* 131* 129*  K 4.1 4.0 2.5* 3.2* 4.7  CL 100 94* 90* 92* 96*  CO2 24 28 28 27 25   GLUCOSE 111* 114* 130* 151* 120*  BUN 16 15 17 18 19   CREATININE 0.79 0.81 1.00 0.92 0.75  CALCIUM 9.5 10.3 9.5 9.1 9.0  MG  --  1.8  --  1.8  --    GFR: Estimated Creatinine Clearance: 32.4 mL/min (by C-G formula based on SCr of 0.75 mg/dL). Liver Function Tests: Recent Labs  Lab 08/25/22 1918  AST 33  ALT 22  ALKPHOS 70  BILITOT 0.9  PROT 6.8  ALBUMIN 3.5   No results for input(s): "LIPASE", "AMYLASE" in the last 168 hours. No results for input(s): "AMMONIA" in the last 168 hours. Coagulation Profile: No results for input(s): "INR", "PROTIME" in the last 168 hours. Cardiac Enzymes: No results for input(s): "CKTOTAL", "CKMB", "CKMBINDEX", "TROPONINI" in the last 168 hours. BNP (last 3 results) No results for input(s): "PROBNP" in the last 8760 hours. HbA1C: No results for input(s): "HGBA1C" in the last 72 hours. CBG: No results for input(s): "GLUCAP" in the last 168 hours. Lipid Profile: No results for input(s): "CHOL", "HDL", "LDLCALC", "TRIG", "CHOLHDL", "LDLDIRECT" in the last 72 hours. Thyroid Function Tests: No results  for input(s): "TSH", "T4TOTAL", "FREET4", "T3FREE", "THYROIDAB" in the last 72 hours. Anemia Panel: No results for input(s): "VITAMINB12", "FOLATE", "FERRITIN", "TIBC", "IRON", "RETICCTPCT" in the last 72 hours. Sepsis Labs: No results for input(s): "PROCALCITON", "LATICACIDVEN" in the last 168 hours.  No results found for this or any previous visit (from the past 240 hour(s)).       Radiology Studies: No results found.      Scheduled Meds:  apixaban  2.5 mg Oral BID   carvedilol  12.5 mg Oral BID WC   clopidogrel  75 mg Oral Daily   dapagliflozin propanediol  10 mg Oral Daily   ezetimibe  10 mg Oral Daily   famotidine  20 mg Oral QHS   sacubitril-valsartan  1 tablet Oral BID   spironolactone  25 mg  Oral Daily   Continuous Infusions:   LOS: 2 days    Time spent: 45 minutes spent on chart review, discussion with nursing staff, consultants, updating family and interview/physical exam; more than 50% of that time was spent in counseling and/or coordination of care.    Geradine Girt, DO [facility] Available via Epic secure chat 7am-7pm After these hours, please refer to coverage provider listed on [email or web address] 08/28/2022, 12:31 PM

## 2022-08-28 NOTE — TOC Transition Note (Signed)
3 Discharge meds from TOC stored in MAIN PHARM[facility]. Please bring white sheet from patient shadow chart to main pharmacy for pick up. - [place] 08/28/2022 

## 2022-08-28 NOTE — Care Management Important Message (Signed)
Important Message  Patient Details  Name: [name] MRN: [id number] Date of Birth: April 23, 1928   Medicare Important Message Given:  Yes     Janesa Dockery Montine [address] 08/28/2022, 2:44 PM

## 2022-08-28 NOTE — Progress Notes (Addendum)
Heart Failure Stewardship Pharmacist Progress Note   PCP: Nche, Charlene Brooke, NP PCP-Cardiologist: [name], MD    HPI:  88 yo F with PMH of CAD s/p PCI, CHF, asthma, GERD, HTN, and HLD.  Recently admitted from 7/11-7/15 with syncopal episode likely due to orthostatic hypotension. ECHO at that time with LVEF 55-60%, mild LVH, RV normal. Discharged on midodrine and HCTZ stopped.   Presented to the ED on 10/3 with shortness of breath, HTN, hypoxia, LE edema, and near syncope. CXR with cardiomegaly, chronic appearing increased interstitial lung markings, and small bilateral pleural effusions. CTA negative for PE. ECHO 10/4 with newly reduced LVEF 30-35%, mild LVH with G3DD, RV mildly reduced.  Current HF Medications: Beta Blocker: carvedilol 12.5 mg BID ACE/ARB/ARNI: Entresto 24/26 mg BID MRA: spironolactone 25 mg daily  Prior to admission HF Medications: ACE/ARB/ARNI: losartan 12.5 mg daily *also taking midodrine 10 mg TID for orthostatic hypotension  Pertinent Lab Values: Serum creatinine 0.75, BUN 19, Potassium 4.7, Sodium 129, BNP 2352.3, Magnesium 1.8  Vital Signs: Weight: 117 lbs - bed weight (admission weight: 121 lbs) Blood pressure: 150-170/80s Heart rate: 60-70s  I/O: incomplete  Medication Assistance / Insurance Benefits Check: Does the patient have prescription insurance?  Yes Type of insurance plan: UHC Medicare  Does the patient qualify for medication assistance through manufacturers or grants?   Yes Eligible grants and/or patient assistance programs: Lisabeth Register Medication assistance applications in progress: Lindi Adie  Medication assistance applications approved: none Approved medication assistance renewals will be completed by: pending  Outpatient Pharmacy:  Prior to admission outpatient [facility]: [facility] Is the patient willing to use [facility] at discharge? Yes Is the patient willing to transition their outpatient pharmacy to utilize a  [facility] outpatient pharmacy?   Pending    Assessment: 1. Acute on chronic systolic and diastolic CHF (LVEF 19-41%). NYHA class III symptoms. - Off IV lasix. Volume improved. Strict I/Os and daily standing weights. Bed weight documented today. Keep K>4 and Mag>2. Consider replacing magnesium with 2g IV x 1. - Continue carvedilol 12.5 mg BID - Continue Entresto 24/26 mg BID - Continue spironolactone 25 mg daily - Consider adding Farxiga 10 mg daily prior to discharge. UA completed. No urinary symptoms reported.  - Suspected that midodrine will not need to be continued, orthostatic symptoms may be related to poor oral intake/malnutrition  Plan: 1) Medication changes recommended at this time: - Start Farxiga 10 mg daily - Magnesium 2 g IV x 1  2) Patient assistance: - Currently in coverage gap (donut hole) - Initial Farxiga copay $99 - Initial Jardiance copay $110 - Patient assistance initiated for Entresto and Farxiga - Discharge meds being sent to [facility] for potential weekend discharge  3)  Education  - Patient and family have been educated on current HF medications and potential additions to HF medication regimen - Patient verbalizes understanding that over the next few months, these medication doses may change and more medications may be added to optimize HF regimen - Patient and family have been educated on basic disease state pathophysiology and goals of therapy   Kerby Nora, PharmD, BCPS Heart Failure Stewardship Pharmacist Phone [phone]

## 2022-08-28 NOTE — Progress Notes (Signed)
Mobility Specialist Progress Note    08/28/22 1626  Mobility  Activity Transferred to/from [facility]  Activity Response Tolerated well  $Mobility charge 1 Mobility  Level of Assistance +2 (takes two people)  Assistive Device Stedy  Mobility Referral Yes   Pt received in chair. Had BM. Returned to bed with call bell in reach.   Hildred Alamin Mobility Specialist

## 2022-08-28 NOTE — Progress Notes (Signed)
Patient requires +2 maximal assist to stand and pivot.  Patient has difficulty with foot placement and directions to safely pivot or turn.  Shuffles feet to move forward and leans/ sways to left and backward with standing.  Requires multiple cues to stand up straight. Family voices concerns with holding patient under arms due to shoulder pain in R > L.  Patient moved to recliner using stedy lift.

## 2022-08-28 NOTE — Progress Notes (Signed)
Mobility Specialist Progress Note    08/28/22 1048  Mobility  Activity Transferred to/from [facility]  Activity Response Tolerated fair  $Mobility charge 1 Mobility  Level of Assistance +2 (takes two people)  Games developer wheel walker;Other (Comment) (HHA)  Mobility Referral Yes   Pt received and agreeable. ModA for bed mobility. MaxA to stand and pivot. Had BM. Left supine with call bell in reach.   Hildred Alamin Mobility Specialist

## 2022-08-28 NOTE — Progress Notes (Signed)
Rounding Note    Patient Name: [name] Date of Encounter: 08/28/2022  [place] Cardiologist: [name], MD    Subjective   88 yo with hx of chronic combined CHF, LBBB,   I/o are about equal  BP is generally better   Is on Entresto 24-26 BID Started Spironolactone today   Generally feeling ok.  Fatigued - was awake most of the night      Inpatient Medications    Scheduled Meds:  apixaban  2.5 mg Oral BID   carvedilol  12.5 mg Oral BID WC   clopidogrel  75 mg Oral Daily   ezetimibe  10 mg Oral Daily   famotidine  20 mg Oral QHS   sacubitril-valsartan  1 tablet Oral BID   spironolactone  25 mg Oral Daily   Continuous Infusions:   PRN Meds: acetaminophen **OR** acetaminophen, albuterol, haloperidol lactate, hydrALAZINE, melatonin, metoprolol tartrate   Vital Signs    Vitals:   08/27/22 1938 08/27/22 2334 08/28/22 0301 08/28/22 0413  BP: 130/71 (!) 141/72 (!) 148/88   Pulse: 63 63 78   Resp: 16 16 20    Temp: 97.7 F (36.5 C) 98.4 F (36.9 C) 97.6 F (36.4 C)   TempSrc: Oral Oral Oral   SpO2: 91% 93% 94%   Weight:    53.1 kg  Height:        Intake/Output Summary (Last 24 hours) at 08/28/2022 1057 Last data filed at 08/28/2022 3716 Gross per 24 hour  Intake 600 ml  Output 150 ml  Net 450 ml       08/28/2022    4:13 AM 08/27/2022    3:17 AM 08/26/2022    8:00 PM  Last 3 Weights  Weight (lbs) 117 lb 1 oz 112 lb 10.5 oz 115 lb 11.9 oz  Weight (kg) 53.1 kg 51.1 kg 52.5 kg      Telemetry     - Personally Reviewed  ECG     NSR - Personally Reviewed  Physical Exam   GEN: elderly female,  NAD   Neck: No JVD Cardiac: RRR, no murmurs, rubs, or gallops.  Respiratory: Clear to auscultation bilaterally. GI: Soft, nontender, non-distended  MS: No edema; No deformity. Neuro:  Nonfocal  Psych: Normal affect   Labs    High Sensitivity Troponin:   Recent Labs  Lab 08/25/22 1918 08/26/22 0040  TROPONINIHS 24* 25*       Chemistry Recent Labs  Lab 08/25/22 1918 08/26/22 0554 08/27/22 0028 08/27/22 1145 08/28/22 0012  NA 135 137 133* 131* 129*  K 4.1 4.0 2.5* 3.2* 4.7  CL 100 94* 90* 92* 96*  CO2 24 28 28 27 25   GLUCOSE 111* 114* 130* 151* 120*  BUN 16 15 17 18 19   CREATININE 0.79 0.81 1.00 0.92 0.75  CALCIUM 9.5 10.3 9.5 9.1 9.0  MG  --  1.8  --  1.8  --   PROT 6.8  --   --   --   --   ALBUMIN 3.5  --   --   --   --   AST 33  --   --   --   --   ALT 22  --   --   --   --   ALKPHOS 70  --   --   --   --   BILITOT 0.9  --   --   --   --   GFRNONAA >60 >60 53* 58* >60  ANIONGAP 11 15 15 12 8      Lipids No results for input(s): "CHOL", "TRIG", "HDL", "LABVLDL", "LDLCALC", "CHOLHDL" in the last 168 hours.  Hematology Recent Labs  Lab 08/25/22 1918 08/27/22 0028 08/28/22 0012  WBC 7.9 7.6 6.9  RBC 3.73* 4.26 4.21  HGB 11.8* 13.1 13.1  HCT 36.5 39.0 38.4  MCV 97.9 91.5 91.2  MCH 31.6 30.8 31.1  MCHC 32.3 33.6 34.1  RDW 14.0 14.0 13.9  PLT 207 236 219    Thyroid No results for input(s): "TSH", "FREET4" in the last 168 hours.  BNP Recent Labs  Lab 08/25/22 1918  BNP 2,352.3*     DDimer No results for input(s): "DDIMER" in the last 168 hours.   Radiology    No results found.  Cardiac Studies      Patient Profile     88 y.o. female  with LBBB , acute on chronic combined CHF, dizziness, orthostasis   Assessment & Plan        Acute on chronic combined CHF:   started on entresto yesterday ,  coreg.   EF is 30-35%.   We have not been able to maintain guideline directed medical therapy due to dizziness, frequent falls.  Is tolerating entresto 24-26 bid Started spironolactone 25 a day , Lasix has been stopped  Atenolol has been changed to coreg 12.5 bid     2. LBBB :   stable.   Her EF has fallen since July.    Cont current meds.  If her EF does not improve, we will need to consider EP evaluation for consideration of BI-v pacer .      3.   Atrial fib:    [name] developed atrial fib with RVR last night.  Documented with 12 lead ECG  Has converted back to NSR Likely due to her CHF. Will start eliquis Age is 56, wt is 51 kg Will start eliquis 2.5 bid       For questions or updates, please contact [name] Please consult [email or web address] for contact info under        Signed, 99, MD  08/28/2022, 10:57 AM

## 2022-08-29 ENCOUNTER — Inpatient Hospital Stay (HOSPITAL_COMMUNITY): Payer: Medicare Other

## 2022-08-29 DIAGNOSIS — R0609 Other forms of dyspnea: Secondary | ICD-10-CM | POA: Diagnosis not present

## 2022-08-29 DIAGNOSIS — I160 Hypertensive urgency: Secondary | ICD-10-CM | POA: Diagnosis not present

## 2022-08-29 DIAGNOSIS — E7800 Pure hypercholesterolemia, unspecified: Secondary | ICD-10-CM | POA: Diagnosis not present

## 2022-08-29 DIAGNOSIS — I2510 Atherosclerotic heart disease of native coronary artery without angina pectoris: Secondary | ICD-10-CM | POA: Diagnosis not present

## 2022-08-29 DIAGNOSIS — J90 Pleural effusion, not elsewhere classified: Secondary | ICD-10-CM | POA: Diagnosis not present

## 2022-08-29 DIAGNOSIS — I5041 Acute combined systolic (congestive) and diastolic (congestive) heart failure: Secondary | ICD-10-CM | POA: Diagnosis not present

## 2022-08-29 DIAGNOSIS — I420 Dilated cardiomyopathy: Secondary | ICD-10-CM

## 2022-08-29 LAB — CBC
HCT: 41.7 % (ref 36.0–46.0)
Hemoglobin: 14.2 g/dL (ref 12.0–15.0)
MCH: 31.8 pg (ref 26.0–34.0)
MCHC: 34.1 g/dL (ref 30.0–36.0)
MCV: 93.3 fL (ref 80.0–100.0)
Platelets: 260 10*3/uL (ref 150–400)
RBC: 4.47 MIL/uL (ref 3.87–5.11)
RDW: 14 % (ref 11.5–15.5)
WBC: 9.1 10*3/uL (ref 4.0–10.5)
nRBC: 0 % (ref 0.0–0.2)

## 2022-08-29 LAB — BASIC METABOLIC PANEL
Anion gap: 9 (ref 5–15)
BUN: 18 mg/dL (ref 8–23)
CO2: 25 mmol/L (ref 22–32)
Calcium: 9.3 mg/dL (ref 8.9–10.3)
Chloride: 95 mmol/L — ABNORMAL LOW (ref 98–111)
Creatinine, Ser: 0.89 mg/dL (ref 0.44–1.00)
GFR, Estimated: >60 mL/min (ref >60)
Glucose, Bld: 130 mg/dL — ABNORMAL HIGH (ref 70–99)
Potassium: 4.4 mmol/L (ref 3.5–5.1)
Sodium: 129 mmol/L — ABNORMAL LOW (ref 135–145)

## 2022-08-29 MED ORDER — BENZONATATE 100 MG PO CAPS
100.0000 mg | ORAL_CAPSULE | Freq: Three times a day (TID) | ORAL | Status: DC | PRN
  Administered 2022-09-01: 100 mg via ORAL
  Filled 2022-08-29: qty 1

## 2022-08-29 MED ORDER — IPRATROPIUM-ALBUTEROL 0.5-2.5 (3) MG/3ML IN SOLN: 3.0000 mL | RESPIRATORY_TRACT | Status: DC | PRN

## 2022-08-29 NOTE — Progress Notes (Addendum)
Occupational Therapy Treatment Patient Details Name: [name] MRN: [id number] DOB: Nov 19, 1928 Today's Date: 08/29/2022   History of present illness Pt is a 88 y.o. female admitted 08/25/22 with SOB, chest tightness, BLE swelling. Workup for CHF exacerbation, hypertensive urgency. Chest CTA showing bilateral pleural effusions. PMH includes CAD s/p PCI, HTN, asthma.   OT comments  Upon arrival pt presented with decreased LOA and increased confusion, family present. Pt required cues to maintain eyes open throughout session, she followed most commands with increased time and cues and made a few remarks in regard to seeing double. Diplopia difficult to assess. Overall she required mod A for bed mobility and min-max A fro sitting balance with severe L lateral lean. MD notified. She also required mod A to stand and max A to pivot to recliner. OT to continue to follow acutely. POC remains appropriate, family confirms they wish to take her home.    Recommendations for follow up therapy are one component of a multi-disciplinary discharge planning process, led by the attending physician.  Recommendations may be updated based on patient status, additional functional criteria and insurance authorization.    Follow Up Recommendations  Home health OT    Assistance Recommended at Discharge Frequent or constant Supervision/Assistance  Patient can return home with the following  A lot of help with walking and/or transfers;A lot of help with bathing/dressing/bathroom;Assistance with cooking/housework;Direct supervision/assist for medications management;Direct supervision/assist for financial management;Assist for transportation;Help with stairs or ramp for entrance   Equipment Recommendations  BSC/3in1       Precautions / Restrictions Precautions Precautions: Fall Restrictions Weight Bearing Restrictions: No       Mobility Bed Mobility Overal bed mobility: Needs Assistance Bed Mobility: Supine  to Sit     Supine to sit: Mod assist     General bed mobility comments: decreased LOA and L lateral lean, increased assist needed this date    Transfers Overall transfer level: Needs assistance Equipment used: 1 person hand held assist Transfers: Sit to/from Stand, Bed to chair/wheelchair/BSC Sit to Stand: Min assist Stand pivot transfers: Max assist         General transfer comment: increased time and cues needed, unable to manage RW this date     Balance Overall balance assessment: Needs assistance Sitting-balance support: Feet supported, Bilateral upper extremity supported Sitting balance-Leahy Scale: Poor   Postural control: Left lateral lean Standing balance support: Bilateral upper extremity supported, Single extremity supported Standing balance-Leahy Scale: Poor                             ADL either performed or assessed with clinical judgement   ADL Overall ADL's : Needs assistance/impaired Eating/Feeding: Sitting;Maximal assistance Eating/Feeding Details (indicate cue type and reason): family attempting to feed pt, requiring max assist                     Toilet Transfer: Maximal assistance;Stand-pivot Toilet Transfer Details (indicate cue type and reason): simulated         Functional mobility during ADLs: Maximal assistance General ADL Comments: L lateral lean, unable to self correct. DV? confusion    Extremity/Trunk Assessment Upper Extremity Assessment Upper Extremity Assessment: Generalized weakness;Difficult to assess due to impaired cognition   Lower Extremity Assessment Lower Extremity Assessment: Defer to PT evaluation        Vision   Vision Assessment?: Vision impaired- to be further tested in functional context Additional Comments: holding up  2 fingers pt stated "you have two over there and two over here" assuming double vision. difficult to assess   Perception Perception Perception: Not tested   Praxis  Praxis Praxis: Not tested    Cognition Arousal/Alertness: Awake/alert Behavior During Therapy: Flat affect Overall Cognitive Status: Impaired/Different from baseline Area of Impairment: Orientation, Memory, Attention, Following commands, Safety/judgement, Awareness, Problem solving                 Orientation Level: Disoriented to, Situation, Place Current Attention Level: Focused Memory: Decreased recall of precautions, Decreased short-term memory Following Commands: Follows one step commands inconsistently, Follows one step commands with increased time Safety/Judgement: Decreased awareness of safety, Decreased awareness of deficits Awareness: Intellectual Problem Solving: Slow processing, Decreased initiation, Difficulty sequencing, Requires verbal cues General Comments: decreased LOA, difficutly maintaing eyes open. confused throughout but follows commands with increased time and cues. When asked what color certain items were she got it incorrect 5x        Exercises      Shoulder Instructions       General Comments family present    Pertinent Vitals/ Pain       Pain Assessment Pain Assessment: No/denies pain   Frequency  Min 2X/week        Progress Toward Goals  OT Goals(current goals can now be found in the care plan section)  Progress towards OT goals: Progressing toward goals  Acute Rehab OT Goals OT Goal Formulation: With patient Time For Goal Achievement: 09/10/22 Potential to Achieve Goals: Good ADL Goals Pt Will Perform Grooming: with min assist;standing Pt Will Perform Upper Body Dressing: sitting;with supervision Pt Will Perform Lower Body Dressing: with min assist;sit to/from stand Pt Will Transfer to Toilet: with min assist;ambulating;regular height toilet;bedside commode Pt Will Perform Toileting - Clothing Manipulation and hygiene: with min assist;sit to/from stand Additional ADL Goal #1: Pt will perform bed mobility with supervision as a  precursor to ADLs.  Plan Discharge plan remains appropriate       AM-PAC OT "6 Clicks" Daily Activity     Outcome Measure   Help from another person eating meals?: A Lot Help from another person taking care of personal grooming?: A Lot Help from another person toileting, which includes using toliet, bedpan, or urinal?: A Lot Help from another person bathing (including washing, rinsing, drying)?: A Lot Help from another person to put on and taking off regular upper body clothing?: A Lot Help from another person to put on and taking off regular lower body clothing?: A Lot 6 Click Score: 12    End of Session Equipment Utilized During Treatment: Gait belt  OT Visit Diagnosis: Unsteadiness on feet (R26.81);Other abnormalities of gait and mobility (R26.89);Muscle weakness (generalized) (M62.81);Other symptoms and signs involving cognitive function   Activity Tolerance Patient tolerated treatment well   Patient Left in chair;with call bell/phone within reach;with family/visitor present   Nurse Communication Mobility status        Time: 1327-1350 OT Time Calculation (min): 23 min  Charges: OT General Charges $OT Visit: 1 Visit OT Treatments $Therapeutic Activity: 23-37 mins    Elliot Cousin 08/29/2022, 2:10 PM

## 2022-08-29 NOTE — Progress Notes (Signed)
Rounding Note    Patient Name: [name] Date of Encounter: 08/29/2022  [facility] Cardiologist: [name], MD    Subjective   88 yo with hx of chronic combined CHF, LBBB,   Is on Entresto 24-26 BID and Spironolactone   Denies any chest pain or shortness of breath  Put out 400 cc of urine yesterday and is net -160 cc since admission question if these are accurate.  Weight down 1 kg from yesterday   Inpatient Medications    Scheduled Meds:  apixaban  2.5 mg Oral BID   carvedilol  12.5 mg Oral BID WC   clopidogrel  75 mg Oral Daily   dapagliflozin propanediol  10 mg Oral Daily   ezetimibe  10 mg Oral Daily   famotidine  20 mg Oral QHS   sacubitril-valsartan  1 tablet Oral BID   spironolactone  25 mg Oral Daily   Continuous Infusions:   PRN Meds: acetaminophen **OR** acetaminophen, haloperidol lactate, hydrALAZINE, ipratropium-albuterol, melatonin, metoprolol tartrate   Vital Signs    Vitals:   08/28/22 2327 08/29/22 0340 08/29/22 0526 08/29/22 0800  BP: 139/73 (!) 147/76  119/71  Pulse: 81 75  79  Resp: 17 17  20   Temp: 98.6 F (37 C) 98.6 F (37 C)  97.9 F (36.6 C)  TempSrc: Oral Oral  Oral  SpO2: 94% 93%    Weight:   52.7 kg   Height:        Intake/Output Summary (Last 24 hours) at 08/29/2022 0950 Last data filed at 08/29/2022 0342 Gross per 24 hour  Intake 240 ml  Output 400 ml  Net -160 ml       08/29/2022    5:26 AM 08/28/2022    4:13 AM 08/27/2022    3:17 AM  Last 3 Weights  Weight (lbs) 116 lb 2.9 oz 117 lb 1 oz 112 lb 10.5 oz  Weight (kg) 52.7 kg 53.1 kg 51.1 kg      Telemetry     Normal sinus rhythm- Personally Reviewed  ECG    No new EKG to review- Personally Reviewed  Physical Exam   GEN: Elderly HEENT: Normal NECK: No JVD; No carotid bruits LYMPHATICS: No lymphadenopathy CARDIAC:RRR, no murmurs, rubs, gallops RESPIRATORY:  Clear to auscultation without rales, wheezing or rhonchi  ABDOMEN: Soft,  non-tender, non-distended MUSCULOSKELETAL:  No edema; No deformity  SKIN: Warm and dry NEUROLOGIC:  Alert and oriented x 3 PSYCHIATRIC:  Normal affect   Labs    High Sensitivity Troponin:   Recent Labs  Lab 08/25/22 1918 08/26/22 0040  TROPONINIHS 24* 25*      Chemistry Recent Labs  Lab 08/25/22 1918 08/26/22 0554 08/27/22 0028 08/27/22 1145 08/28/22 0012 08/29/22 0902  NA 135 137   < > 131* 129* 129*  K 4.1 4.0   < > 3.2* 4.7 4.4  CL 100 94*   < > 92* 96* 95*  CO2 24 28   < > 27 25 25   GLUCOSE 111* 114*   < > 151* 120* 130*  BUN 16 15   < > 18 19 18   CREATININE 0.79 0.81   < > 0.92 0.75 0.89  CALCIUM 9.5 10.3   < > 9.1 9.0 9.3  MG  --  1.8  --  1.8  --   --   PROT 6.8  --   --   --   --   --   ALBUMIN 3.5  --   --   --   --   --  AST 33  --   --   --   --   --   ALT 22  --   --   --   --   --   ALKPHOS 70  --   --   --   --   --   BILITOT 0.9  --   --   --   --   --   GFRNONAA >60 >60   < > 58* >60 >60  ANIONGAP 11 15   < > 12 8 9    < > = values in this interval not displayed.     Lipids No results for input(s): "CHOL", "TRIG", "HDL", "LABVLDL", "LDLCALC", "CHOLHDL" in the last 168 hours.  Hematology Recent Labs  Lab 08/27/22 0028 08/28/22 0012 08/29/22 0902  WBC 7.6 6.9 9.1  RBC 4.26 4.21 4.47  HGB 13.1 13.1 14.2  HCT 39.0 38.4 41.7  MCV 91.5 91.2 93.3  MCH 30.8 31.1 31.8  MCHC 33.6 34.1 34.1  RDW 14.0 13.9 14.0  PLT 236 219 260    Thyroid No results for input(s): "TSH", "FREET4" in the last 168 hours.  BNP Recent Labs  Lab 08/25/22 1918  BNP 2,352.3*     DDimer No results for input(s): "DDIMER" in the last 168 hours.   Radiology    No results found.  Cardiac Studies   2D echo 08/26/2022 IMPRESSIONS    1. Left ventricular ejection fraction, by estimation, is 30 to 35%. The  left ventricle has moderately decreased function. The left ventricle  demonstrates regional wall motion abnormalities with septal hypokinesis  and  septal-lateral dyssynchrony  suggestive of LBBB. There is mild concentric left ventricular hypertrophy.  Left ventricular diastolic parameters are consistent with Grade III  diastolic dysfunction (restrictive).   2. Right ventricular systolic function is mildly reduced. The right  ventricular size is normal. There is moderately elevated pulmonary artery  systolic pressure. The estimated right ventricular systolic pressure is  56.0 mmHg.   3. Left atrial size was moderately dilated.   4. The mitral valve is normal in structure. Trivial mitral valve  regurgitation. No evidence of mitral stenosis.   5. The aortic valve is tricuspid. There is moderate calcification of the  aortic valve. Aortic valve regurgitation is trivial. No aortic stenosis is  present.   6. Aortic dilatation noted. There is mild dilatation of the ascending  aorta, measuring 39 mm.   7. The inferior vena cava is normal in size with greater than 50%  respiratory variability, suggesting right atrial pressure of 3 mmHg.   Patient Profile     88 y.o. female  with LBBB , acute on chronic combined CHF, dizziness, orthostasis   Assessment & Plan     Acute on chronic combined CHF:   -EF is 30-35%.    -We have not been able to maintain guideline directed medical therapy due to dizziness, frequent falls. -Started on Entresto 24-26 mg twice daily and spironolactone 25 mg daily and is tolerating well -Atenolol was changed to carvedilol 12.5 mg twice daily -BP stable at 119/71 mmHg this morning -Serum creatinine 0.89 and potassium 4.4 this morning -Sodium stable at 129 but was 137 on admission -She does not appear volume overloaded on exam  2. LBBB    -stable.    -Her EF has fallen since July.    -Cont current meds.  -If her EF does not improve, we will need to consider EP evaluation for consideration of BI-v pacer, although with  advanced age not sure she would be a candidate.      3.   Atrial fibrillation with RVR     -This occurred during hospitalization  -Documented with 12 lead ECG  -Now back in normal sinus rhythm with no recurrence of A-fib  -Suspect related to volume overload -CHA2DS2-VASc score is 6 and therefore started on Eliquis 2.5 mg twice daily dosed for weight less than 60 kg and age greater than 48  4.  ASCAD -s/p PCI of RCA 2015 -Cath 2021 with 90% mid LAD, 80% D3, 60% ostial and proximal left circumflex, 95% proximal in-stent restenosis of RCA and 95% mid RCA status post PCI to cover all lesions with normal LV function at that time -She has not had any chest pain -Continue medical management with carvedilol 12.5 mg twice daily, Plavix 75 mg daily, Zetia 10 mg daily -She is statin intolerant -Will defer defer consideration of outpatient ischemic work-up in setting of new LV dysfunction to Dr. Elmarie Shiley discretion  I have spent a total of 40 minutes with patient reviewing cardiac cath and 2D echo , telemetry, EKGs, labs and examining patient as well as establishing an assessment and plan that was discussed with the patient.  > 50% of time was spent in direct patient care.     For questions or updates, please contact [facility] [place] Please consult [email or web address] for contact info under        Signed, Fransico Him, MD  08/29/2022, 9:50 AM

## 2022-08-29 NOTE — Plan of Care (Signed)
  Problem: Education: Goal: Ability to demonstrate management of disease process will improve Outcome: Progressing Goal: Ability to verbalize understanding of medication therapies will improve Outcome: Progressing Goal: Individualized Educational Video(s) Outcome: Progressing   Problem: Activity: Goal: Capacity to carry out activities will improve Outcome: Not Progressing   Problem: Cardiac: Goal: Ability to achieve and maintain adequate cardiopulmonary perfusion will improve Outcome: Progressing   Problem: Education: Goal: Knowledge of General Education information will improve Description: Including pain rating scale, medication(s)/side effects and non-pharmacologic comfort measures Outcome: Progressing   Problem: Health Behavior/Discharge Planning: Goal: Ability to manage health-related needs will improve Outcome: Progressing   Problem: Clinical Measurements: Goal: Ability to maintain clinical measurements within normal limits will improve Outcome: Progressing Goal: Will remain free from infection Outcome: Progressing Goal: Diagnostic test results will improve Outcome: Progressing Goal: Respiratory complications will improve Outcome: Progressing Goal: Cardiovascular complication will be avoided Outcome: Progressing   Problem: Activity: Goal: Risk for activity intolerance will decrease Outcome: Progressing   Problem: Nutrition: Goal: Adequate nutrition will be maintained Outcome: Progressing   Problem: Coping: Goal: Level of anxiety will decrease Outcome: Progressing   Problem: Elimination: Goal: Will not experience complications related to bowel motility Outcome: Progressing Goal: Will not experience complications related to urinary retention Outcome: Progressing   Problem: Pain Managment: Goal: General experience of comfort will improve Outcome: Progressing   Problem: Safety: Goal: Ability to remain free from injury will improve Outcome: Progressing    Problem: Skin Integrity: Goal: Risk for impaired skin integrity will decrease Outcome: Progressing

## 2022-08-29 NOTE — Progress Notes (Addendum)
PROGRESS NOTE    Reigna Faron Godwin  [id number] DOB: 10/08/1928 DOA: 08/25/2022 PCP: [name], NP    Brief Narrative:  Jasmeet Gouch Dubeau is a 88 y.o. female with medical history significant of CAD status post PCI, HFpEF, asthma, GERD, hypertension, hyperlipidemia.  Admitted in July 2023 for syncope secondary to orthostatic hypotension.  She was started on midodrine and hydrochlorothiazide discontinued.  She presents to the ED complaining of exertional dyspnea and chest tightness, near syncope, and bilateral lower extremity edema.  Hypertensive with systolic in the [id number] to [id number].  Desatted to 59s with ambulation.  Labs showing no leukocytosis, hemoglobin 11.8 (no significant change from baseline), creatinine 0.7, troponin 24> 25, BNP 2352.  UA with negative nitrite, small amount of leukocytes, and microscopy showing 6-10 WBCs and no bacteria.  CTA chest negative for PE.  Showing bilateral pleural effusions. Patient was given aspirin, IV Lasix 40 mg, and IV hydralazine 10 mg.   Patient is reporting 3-day history of exertional dyspnea and chest tightness.  Every time she tries to walk, she feels lightheaded and feels like she will pass out.  No loss of consciousness reported.  She is also reporting bilateral lower extremity edema.  Daughter is concerned that when they checked her oxygen saturation at home after she walked, it was as low as 50s to 60s.  She is taking all of her home antihypertensives and is also on midodrine due to history of orthostatic hypotension.  Patient denies any chest pain at this time.  No other complaints.    Assessment and Plan: Acute on chronic diastolic CHF with Acute hypoxemic respiratory failure Not hypoxic at rest but oxygen saturation dropped to the 80s with ambulation.  BNP 2352.  CTA chest negative for PE; showing bilateral pleural effusions. Echo done in July 2023 showing EF 55 to 123456, grade 1 diastolic dysfunction, mild MR, severe thickening of the aortic  valve without evidence of stenosis.  Now EF 30%,  She is not on diuretics at home.   Meds per cards -Monitor intake and output -Daily weights -Low-sodium diet with fluid restriction (eats a lot of fast foods at home) -per cards   Chest pain/CAD with multiple stents CTA chest negative for PE.  She does have a new left bundle branch block on EKG.  Troponin mildly elevated in the setting of acute CHF but stable (24> 25) and not consistent with ACS.  Currently chest pain-free and appears comfortable. LHC done April 2022   -cards following  Hypokalemia -replete  Episode of a fib -rate controlled -add eliquis  Weakness/left lean- r/o CVA with MRI -could be a fib related but now on eliquis  Hypertensive urgency Blood pressure remains elevated with systolic in the [id number].  Cardiology recommending continuing home meds, diuresis with IV Lasix, and IV hydralazine prn.  -per cards   Asthma Stable, no signs of acute exacerbation. -Continue albuterol as needed   Hyperlipidemia -Continue Zetia   Abnormal urinalysis UA with negative nitrite, small amount of leukocytes, and microscopy showing 6-10 WBCs and no bacteria.  Patient is not endorsing any urinary symptoms.   DVT prophylaxis: apixaban (ELIQUIS) tablet 2.5 mg Start: 08/27/22 1245 apixaban (ELIQUIS) tablet 2.5 mg    Code Status: DNR Family Communication: daughter at bedside; called granddaughter  Disposition Plan:  Level of care: Telemetry Cardiac Status is: Inpatient Remains inpatient appropriate because: needs diuresis    Consultants:  cards   Subjective: Dry cough  Objective: Vitals:   08/29/22 0340 08/29/22 0526  08/29/22 0800 08/29/22 1148  BP: (!) 147/76  119/71 121/85  Pulse: 75  79 70  Resp: 17  20 20   Temp: 98.6 F (37 C)  97.9 F (36.6 C) 98.4 F (36.9 C)  TempSrc: Oral  Oral Oral  SpO2: 93%   90%  Weight:  52.7 kg    Height:        Intake/Output Summary (Last 24 hours) at 08/29/2022 1149 Last data  filed at 08/29/2022 0342 Gross per 24 hour  Intake 240 ml  Output 400 ml  Net -160 ml   Filed Weights   08/27/22 0317 08/28/22 0413 08/29/22 0526  Weight: 51.1 kg 53.1 kg 52.7 kg    Examination:   General: Appearance:    elderly female in no acute distress     Lungs:     Diminished, respirations unlabored  Heart:    Normal heart rate.   MS:   All extremities are intact.   Neurologic:   Awake, alert       Data Reviewed: I have personally reviewed following labs and imaging studies  CBC: Recent Labs  Lab 08/25/22 1918 08/27/22 0028 08/28/22 0012 08/29/22 0902  WBC 7.9 7.6 6.9 9.1  NEUTROABS 4.9  --   --   --   HGB 11.8* 13.1 13.1 14.2  HCT 36.5 39.0 38.4 41.7  MCV 97.9 91.5 91.2 93.3  PLT 207 236 219 123456   Basic Metabolic Panel: Recent Labs  Lab 08/26/22 0554 08/27/22 0028 08/27/22 1145 08/28/22 0012 08/29/22 0902  NA 137 133* 131* 129* 129*  K 4.0 2.5* 3.2* 4.7 4.4  CL 94* 90* 92* 96* 95*  CO2 28 28 27 25 25   GLUCOSE 114* 130* 151* 120* 130*  BUN 15 17 18 19 18   CREATININE 0.81 1.00 0.92 0.75 0.89  CALCIUM 10.3 9.5 9.1 9.0 9.3  MG 1.8  --  1.8  --   --    GFR: Estimated Creatinine Clearance: 29 mL/min (by C-G formula based on SCr of 0.89 mg/dL). Liver Function Tests: Recent Labs  Lab 08/25/22 1918  AST 33  ALT 22  ALKPHOS 70  BILITOT 0.9  PROT 6.8  ALBUMIN 3.5   No results for input(s): "LIPASE", "AMYLASE" in the last 168 hours. No results for input(s): "AMMONIA" in the last 168 hours. Coagulation Profile: No results for input(s): "INR", "PROTIME" in the last 168 hours. Cardiac Enzymes: Recent Labs  Lab 08/28/22 0012  CKTOTAL 246*   BNP (last 3 results) No results for input(s): "PROBNP" in the last 8760 hours. HbA1C: No results for input(s): "HGBA1C" in the last 72 hours. CBG: No results for input(s): "GLUCAP" in the last 168 hours. Lipid Profile: No results for input(s): "CHOL", "HDL", "LDLCALC", "TRIG", "CHOLHDL", "LDLDIRECT" in  the last 72 hours. Thyroid Function Tests: No results for input(s): "TSH", "T4TOTAL", "FREET4", "T3FREE", "THYROIDAB" in the last 72 hours. Anemia Panel: No results for input(s): "VITAMINB12", "FOLATE", "FERRITIN", "TIBC", "IRON", "RETICCTPCT" in the last 72 hours. Sepsis Labs: No results for input(s): "PROCALCITON", "LATICACIDVEN" in the last 168 hours.  No results found for this or any previous visit (from the past 240 hour(s)).       Radiology Studies: DG CHEST PORT 1 VIEW  Result Date: 08/29/2022 CLINICAL DATA:  Follow-up in patient for CHF.  Cough. EXAM: PORTABLE CHEST 1 VIEW COMPARISON:  CT, 08/26/2022.  Chest radiograph, 08/25/2022. FINDINGS: Cardiac silhouette mildly enlarged. Right hilar prominence stable from the prior studies. No mediastinal or left hilar masses. Interstitial  and hazy opacities in the right upper lung, similar to the prior exam. Mild bilateral interstitial prominence, improved compared to the prior chest radiograph. No new lung opacities. Probable small effusions, decreased when compared to the prior chest radiograph and chest CT. No pneumothorax. IMPRESSION: 1. Interval decrease in small bilateral effusions. 2. Interstitial prominence decreased compared to the prior chest radiograph consistent with improved/resolved interstitial edema. 3. No new abnormalities. Electronically Signed   By: Lajean Manes M.D.   On: 08/29/2022 10:36        Scheduled Meds:  apixaban  2.5 mg Oral BID   carvedilol  12.5 mg Oral BID WC   clopidogrel  75 mg Oral Daily   dapagliflozin propanediol  10 mg Oral Daily   ezetimibe  10 mg Oral Daily   famotidine  20 mg Oral QHS   sacubitril-valsartan  1 tablet Oral BID   spironolactone  25 mg Oral Daily   Continuous Infusions:   LOS: 3 days    Time spent: 45 minutes spent on chart review, discussion with nursing staff, consultants, updating family and interview/physical exam; more than 50% of that time was spent in counseling and/or  coordination of care.    Geradine Girt, DO [facility] Available via Epic secure chat 7am-7pm After these hours, please refer to coverage provider listed on [email or web address] 08/29/2022, 11:49 AM

## 2022-08-30 DIAGNOSIS — R0609 Other forms of dyspnea: Secondary | ICD-10-CM | POA: Diagnosis not present

## 2022-08-30 DIAGNOSIS — I2510 Atherosclerotic heart disease of native coronary artery without angina pectoris: Secondary | ICD-10-CM | POA: Diagnosis not present

## 2022-08-30 DIAGNOSIS — E7800 Pure hypercholesterolemia, unspecified: Secondary | ICD-10-CM | POA: Diagnosis not present

## 2022-08-30 DIAGNOSIS — I5041 Acute combined systolic (congestive) and diastolic (congestive) heart failure: Secondary | ICD-10-CM | POA: Diagnosis not present

## 2022-08-30 DIAGNOSIS — I160 Hypertensive urgency: Secondary | ICD-10-CM | POA: Diagnosis not present

## 2022-08-30 LAB — RESPIRATORY PANEL BY PCR

## 2022-08-30 LAB — BASIC METABOLIC PANEL
Anion gap: 9 (ref 5–15)
BUN: 25 mg/dL — ABNORMAL HIGH (ref 8–23)
CO2: 24 mmol/L (ref 22–32)
Calcium: 9.3 mg/dL (ref 8.9–10.3)
Chloride: 97 mmol/L — ABNORMAL LOW (ref 98–111)
Creatinine, Ser: 1.1 mg/dL — ABNORMAL HIGH (ref 0.44–1.00)
GFR, Estimated: 47 mL/min — ABNORMAL LOW (ref >60)
Glucose, Bld: 158 mg/dL — ABNORMAL HIGH (ref 70–99)
Potassium: 4.6 mmol/L (ref 3.5–5.1)
Sodium: 130 mmol/L — ABNORMAL LOW (ref 135–145)

## 2022-08-30 LAB — CBC
HCT: 40.6 % (ref 36.0–46.0)
Hemoglobin: 13.5 g/dL (ref 12.0–15.0)
MCH: 31.7 pg (ref 26.0–34.0)
MCHC: 33.3 g/dL (ref 30.0–36.0)
MCV: 95.3 fL (ref 80.0–100.0)
Platelets: 281 10*3/uL (ref 150–400)
RBC: 4.26 MIL/uL (ref 3.87–5.11)
RDW: 14 % (ref 11.5–15.5)
WBC: 11.1 10*3/uL — ABNORMAL HIGH (ref 4.0–10.5)
nRBC: 0 % (ref 0.0–0.2)

## 2022-08-30 LAB — SARS CORONAVIRUS 2 BY RT PCR: SARS Coronavirus 2 by RT PCR: NEGATIVE

## 2022-08-30 MED ORDER — METOPROLOL TARTRATE 12.5 MG HALF TABLET
12.5000 mg | ORAL_TABLET | Freq: Two times a day (BID) | ORAL | Status: DC
  Administered 2022-08-30 (×2): 12.5 mg via ORAL
  Filled 2022-08-30 (×2): qty 1

## 2022-08-30 MED ORDER — HYDRALAZINE HCL 10 MG PO TABS: 10.0000 mg | ORAL_TABLET | Freq: Three times a day (TID) | ORAL | Status: DC

## 2022-08-30 NOTE — Progress Notes (Addendum)
Rounding Note    Patient Name: [name] Date of Encounter: 08/30/2022  [place] Cardiologist: [name], MD    Subjective   88 yo with hx of chronic combined CHF, LBBB,   Denies any chest pain or shortness of breath but apparently has developed a cough since going on Entresto.  She had a similar cough on lisinopril.  Dropped her systolic blood pressure into the 90s last night and carvedilol was held this morning.  Put out 350 cc of urine yesterday and is net -4 cc since admission >> ? if these are accurate.  No change in weight in the last 24 hours   Inpatient Medications    Scheduled Meds:  apixaban  2.5 mg Oral BID   carvedilol  12.5 mg Oral BID WC   clopidogrel  75 mg Oral Daily   dapagliflozin propanediol  10 mg Oral Daily   ezetimibe  10 mg Oral Daily   famotidine  20 mg Oral QHS   spironolactone  25 mg Oral Daily   Continuous Infusions:   PRN Meds: acetaminophen **OR** acetaminophen, benzonatate, haloperidol lactate, hydrALAZINE, ipratropium-albuterol, melatonin, metoprolol tartrate   Vital Signs    Vitals:   08/30/22 0221 08/30/22 0400 08/30/22 0500 08/30/22 0836  BP: (!) 94/55 (!) 124/90  110/63  Pulse: 85 80  71  Resp: 19 20  20   Temp:  97.8 F (36.6 C)  98.4 F (36.9 C)  TempSrc:  Oral  Axillary  SpO2: 97%   90%  Weight:   52.6 kg   Height:        Intake/Output Summary (Last 24 hours) at 08/30/2022 1025 Last data filed at 08/29/2022 1800 Gross per 24 hour  Intake 260 ml  Output 350 ml  Net -90 ml       08/30/2022    5:00 AM 08/29/2022    5:26 AM 08/28/2022    4:13 AM  Last 3 Weights  Weight (lbs) 115 lb 15.4 oz 116 lb 2.9 oz 117 lb 1 oz  Weight (kg) 52.6 kg 52.7 kg 53.1 kg      Telemetry     Normal sinus rhythm- Personally Reviewed  ECG    No new EKG to review- Personally Reviewed  Physical Exam   GEN: Well nourished, well developed in no acute distress HEENT: Normal NECK: No JVD; No carotid  bruits LYMPHATICS: No lymphadenopathy CARDIAC:RRR, no murmurs, rubs, gallops RESPIRATORY:  Clear to auscultation without rales, wheezing or rhonchi  ABDOMEN: Soft, non-tender, non-distended MUSCULOSKELETAL:  No edema; No deformity  SKIN: Warm and dry NEUROLOGIC:  Alert and oriented x 3 PSYCHIATRIC:  Normal affect   Labs    High Sensitivity Troponin:   Recent Labs  Lab 08/25/22 1918 08/26/22 0040  TROPONINIHS 24* 25*      Chemistry Recent Labs  Lab 08/25/22 1918 08/26/22 0554 08/27/22 0028 08/27/22 1145 08/28/22 0012 08/29/22 0902 08/30/22 0040  NA 135 137   < > 131* 129* 129* 130*  K 4.1 4.0   < > 3.2* 4.7 4.4 4.6  CL 100 94*   < > 92* 96* 95* 97*  CO2 24 28   < > 27 25 25 24   GLUCOSE 111* 114*   < > 151* 120* 130* 158*  BUN 16 15   < > 18 19 18  25*  CREATININE 0.79 0.81   < > 0.92 0.75 0.89 1.10*  CALCIUM 9.5 10.3   < > 9.1 9.0 9.3 9.3  MG  --  1.8  --  1.8  --   --   --   PROT 6.8  --   --   --   --   --   --   ALBUMIN 3.5  --   --   --   --   --   --   AST 33  --   --   --   --   --   --   ALT 22  --   --   --   --   --   --   ALKPHOS 70  --   --   --   --   --   --   BILITOT 0.9  --   --   --   --   --   --   GFRNONAA >60 >60   < > 58* >60 >60 47*  ANIONGAP 11 15   < > 12 8 9 9    < > = values in this interval not displayed.     Lipids No results for input(s): "CHOL", "TRIG", "HDL", "LABVLDL", "LDLCALC", "CHOLHDL" in the last 168 hours.  Hematology Recent Labs  Lab 08/28/22 0012 08/29/22 0902 08/30/22 0040  WBC 6.9 9.1 11.1*  RBC 4.21 4.47 4.26  HGB 13.1 14.2 13.5  HCT 38.4 41.7 40.6  MCV 91.2 93.3 95.3  MCH 31.1 31.8 31.7  MCHC 34.1 34.1 33.3  RDW 13.9 14.0 14.0  PLT 219 260 281    Thyroid No results for input(s): "TSH", "FREET4" in the last 168 hours.  BNP Recent Labs  Lab 08/25/22 1918  BNP 2,352.3*     DDimer No results for input(s): "DDIMER" in the last 168 hours.   Radiology    MR BRAIN WO CONTRAST  Result Date:  08/29/2022 CLINICAL DATA:  Neuro deficit, stroke suspected.  Near syncope. EXAM: MRI HEAD WITHOUT CONTRAST TECHNIQUE: Multiplanar, multiecho pulse sequences of the brain and surrounding structures were obtained without intravenous contrast. COMPARISON:  MR head without contrast 03/08/2020. CT head without contrast 03/07/2022. FINDINGS: Brain: No acute infarct, hemorrhage, or mass lesion is present. Progressive advanced atrophy and diffuse white matter disease is present. The ventricles are proportionate to the degree of atrophy. The brainstem and cerebellum are within normal limits. Vascular: Flow is present in the major intracranial arteries. Skull and upper cervical spine: Multilevel degenerative changes are present in the upper cervical spine. Craniocervical junction is normal. Midline structures are unremarkable. Sinuses/Orbits: The paranasal sinuses and mastoid air cells are clear. Bilateral lens replacements are noted. Globes and orbits are otherwise unremarkable. IMPRESSION: 1. No acute intracranial abnormality. 2. Progressive advanced atrophy and diffuse white matter disease. This likely reflects the sequela of chronic microvascular ischemia. Electronically Signed   By: San Morelle M.D.   On: 08/29/2022 17:02   DG CHEST PORT 1 VIEW  Result Date: 08/29/2022 CLINICAL DATA:  Follow-up in patient for CHF.  Cough. EXAM: PORTABLE CHEST 1 VIEW COMPARISON:  CT, 08/26/2022.  Chest radiograph, 08/25/2022. FINDINGS: Cardiac silhouette mildly enlarged. Right hilar prominence stable from the prior studies. No mediastinal or left hilar masses. Interstitial and hazy opacities in the right upper lung, similar to the prior exam. Mild bilateral interstitial prominence, improved compared to the prior chest radiograph. No new lung opacities. Probable small effusions, decreased when compared to the prior chest radiograph and chest CT. No pneumothorax. IMPRESSION: 1. Interval decrease in small bilateral effusions. 2.  Interstitial prominence decreased compared to the prior chest radiograph consistent with improved/resolved  interstitial edema. 3. No new abnormalities. Electronically Signed   By: Lajean Manes M.D.   On: 08/29/2022 10:36    Cardiac Studies   2D echo 08/26/2022 IMPRESSIONS    1. Left ventricular ejection fraction, by estimation, is 30 to 35%. The  left ventricle has moderately decreased function. The left ventricle  demonstrates regional wall motion abnormalities with septal hypokinesis  and septal-lateral dyssynchrony  suggestive of LBBB. There is mild concentric left ventricular hypertrophy.  Left ventricular diastolic parameters are consistent with Grade III  diastolic dysfunction (restrictive).   2. Right ventricular systolic function is mildly reduced. The right  ventricular size is normal. There is moderately elevated pulmonary artery  systolic pressure. The estimated right ventricular systolic pressure is  [id number] mmHg.   3. Left atrial size was moderately dilated.   4. The mitral valve is normal in structure. Trivial mitral valve  regurgitation. No evidence of mitral stenosis.   5. The aortic valve is tricuspid. There is moderate calcification of the  aortic valve. Aortic valve regurgitation is trivial. No aortic stenosis is  present.   6. Aortic dilatation noted. There is mild dilatation of the ascending  aorta, measuring 39 mm.   7. The inferior vena cava is normal in size with greater than 50%  respiratory variability, suggesting right atrial pressure of 3 mmHg.   Patient Profile     88 y.o. female  with LBBB , acute on chronic combined CHF, dizziness, orthostasis   Assessment & Plan     Acute on chronic combined CHF:   -EF is 30-35%.    -We have not been able to maintain guideline directed medical therapy due to dizziness, frequent falls. -Started on Entresto 24-26 mg twice daily and spironolactone 25 mg daily unfortunately ever since starting Entresto she has developed  a cough and had a similar cough on lisinopril -stop Entresto due to cough  -BP not tolerating addition of Entresto and spironolactone carvedilol held this morning due to soft blood pressure -SBP has been anywhere from 95 to 110 mmHg -Serum creatinine bumped slightly from 0.89->>1.1 today, K+ 4.6 and Na improved from 129->>130 today -She does not appear volume overloaded on exam -Will also stop spironolactone and Farxiga due to hypotension -Change carvedilol to metoprolol 12.5 mg twice daily (with blood pressure parameters) and if blood pressure tolerates this then will consolidate to Toprol 25 mg daily prior to discharge  2. LBBB    -stable.    -Her EF has fallen since July.    -If her EF does not improve, we will need to consider EP evaluation for consideration of BI-v pacer, although with advanced age not sure she would be a candidate.      3.   Atrial fibrillation with RVR    -This occurred during hospitalization  -Documented with 12 lead ECG  -Converted back to sinus rhythm and has had no recurrence of atrial fibrillation -Suspect related to volume overload -CHA2DS2-VASc score is 6  -Eliquis 2.5 mg twice daily (dosed for weight<60 kg and age >30 -Change carvedilol to Lopressor 12.5 mg twice daily due to soft blood pressure (metoprolol will suppress atrial arrhythmias better than carvedilol with less blood pressure effect)  4.  ASCAD -s/p PCI of RCA 2015 -Cath 2021 with 90% mid LAD, 80% D3, 60% ostial and proximal left circumflex, 95% proximal in-stent restenosis of RCA and 95% mid RCA status post PCI to cover all lesions with normal LV function at that time -She denies any chest pain -  Continue medical management with low-dose beta-blocker, Plavix 75 mg daily, Zetia 10 mg daily -She is statin intolerant -Will defer defer consideration of outpatient ischemic work-up in setting of new LV dysfunction to Dr. Elmarie Shiley discretion  I have spent a total of 40 minutes with patient reviewing  cardiac cath and 2D echo , telemetry, EKGs, labs and examining patient as well as establishing an assessment and plan that was discussed with the patient.  > 50% of time was spent in direct patient care.     For questions or updates, please contact [place] Please consult [email or web address] for contact info under        Signed, Fransico Him, MD  08/30/2022, 10:25 AM

## 2022-08-30 NOTE — Progress Notes (Signed)
Physical Therapy Treatment Patient Details Name: [name] MRN: [id number] DOB: 1928/11/20 Today's Date: 08/30/2022   History of Present Illness Pt is a 88 y.o. female admitted 08/25/22 with SOB, chest tightness, BLE swelling. Workup for CHF exacerbation, hypertensive urgency. Chest CTA showing bilateral pleural effusions. PMH includes CAD s/p PCI, HTN, asthma.    PT Comments    Pt is limited by fatigue and continued weakness, but is able to tolerate multiple transfer attempts this session. Pt demonstrates a poor ability to sequence and consistently follow cues to improve transfer technique. This along with a strong posterior lean result in a high falls risk. Pt requires significant physical assistance to transfer at this time. Pt will benefit from frequent mobilization in an effort to improve endurance and reduce caregiver burden. PT updates recommendations to SNF placement due to high falls risk.   Recommendations for follow up therapy are one component of a multi-disciplinary discharge planning process, led by the attending physician.  Recommendations may be updated based on patient status, additional functional criteria and insurance authorization.  Follow Up Recommendations  Skilled nursing-short term rehab (<3 hours/day) Can patient physically be transported by private vehicle: No   Assistance Recommended at Discharge Frequent or constant Supervision/Assistance  Patient can return home with the following A lot of help with walking and/or transfers;A lot of help with bathing/dressing/bathroom;Assistance with cooking/housework;Assist for transportation;Help with stairs or ramp for entrance   Equipment Recommendations  Wheelchair (measurements PT);Wheelchair cushion (measurements PT)    Recommendations for Other Services       Precautions / Restrictions Precautions Precautions: Fall Restrictions Weight Bearing Restrictions: No     Mobility  Bed Mobility Overal bed  mobility: Needs Assistance Bed Mobility: Supine to Sit     Supine to sit: HOB elevated, Mod assist     General bed mobility comments: verbal cues for sequencing, assist for LE mobility and to pull trunk into sitting, PT utilizing pad to assist in squaring hips to edge of bed    Transfers Overall transfer level: Needs assistance Equipment used: 1 person hand held assist Transfers: Sit to/from Stand, Bed to chair/wheelchair/BSC Sit to Stand: Mod assist Stand pivot transfers: Mod assist         General transfer comment: posterior lean, feet sliding anteriorly at times. PT provides cues for hand and foot placement, utilizies visual and tactile cues in an attempt to facilitate increased trunk flexion. Pt performs 3 sit to stand and 1 SPT    Ambulation/Gait                   Stairs             Wheelchair Mobility    Modified Rankin (Stroke Patients Only)       Balance Overall balance assessment: Needs assistance Sitting-balance support: Feet supported, Single extremity supported, Bilateral upper extremity supported Sitting balance-Leahy Scale: Poor Sitting balance - Comments: minA to minG   Standing balance support: Bilateral upper extremity supported, Reliant on assistive device for balance Standing balance-Leahy Scale: Poor Standing balance comment: modA, posterior lean                            Cognition Arousal/Alertness: Awake/alert Behavior During Therapy: Flat affect Overall Cognitive Status: Impaired/Different from baseline Area of Impairment: Orientation, Attention, Memory, Following commands, Safety/judgement, Awareness, Problem solving                 Orientation Level: Disoriented to,  Time Current Attention Level: Sustained Memory: Decreased recall of precautions, Decreased short-term memory Following Commands: Follows one step commands with increased time, Follows one step commands inconsistently Safety/Judgement:  Decreased awareness of safety, Decreased awareness of deficits Awareness: Intellectual Problem Solving: Slow processing, Difficulty sequencing, Decreased initiation, Requires verbal cues          Exercises      General Comments General comments (skin integrity, edema, etc.): family present, pt with cough, vitals stable sats in  low 90s on RA      Pertinent Vitals/Pain Pain Assessment Pain Assessment: Faces Faces Pain Scale: Hurts a little bit Pain Location: throat Pain Descriptors / Indicators: Sore Pain Intervention(s): Monitored during session    Home Living                          Prior Function            PT Goals (current goals can now be found in the care plan section) Acute Rehab PT Goals Patient Stated Goal: to go home Progress towards PT goals: Not progressing toward goals - comment (limited by weakness and poor endurance)    Frequency    Min 3X/week      PT Plan Discharge plan needs to be updated    Co-evaluation              AM-PAC PT "6 Clicks" Mobility   Outcome Measure  Help needed turning from your back to your side while in a flat bed without using bedrails?: A Little Help needed moving from lying on your back to sitting on the side of a flat bed without using bedrails?: A Lot Help needed moving to and from a bed to a chair (including a wheelchair)?: A Lot Help needed standing up from a chair using your arms (e.g., wheelchair or bedside chair)?: A Lot Help needed to walk in hospital room?: Total Help needed climbing 3-5 steps with a railing? : Total 6 Click Score: 11    End of Session   Activity Tolerance: Patient limited by fatigue Patient left: in chair;with call bell/phone within reach;with chair alarm set Nurse Communication: Mobility status PT Visit Diagnosis: Unsteadiness on feet (R26.81);Muscle weakness (generalized) (M62.81);Difficulty in walking, not elsewhere classified (R26.2)     Time: [id number] PT Time  Calculation (min) (ACUTE ONLY): 26 min  Charges:  $Therapeutic Activity: 23-37 mins                     Zenaida Niece, PT, DPT Acute Rehabilitation Office [place] [name] 08/30/2022, 4:17 PM

## 2022-08-30 NOTE — Plan of Care (Signed)

## 2022-08-30 NOTE — Progress Notes (Signed)
PROGRESS NOTE    [name]  ZOX:[id number] DOB: 1928-08-18 DOA: 08/25/2022 PCP: [name], NP    Brief Narrative:  [name] is a 88 y.o. female with medical history significant of CAD status post PCI, HFpEF, asthma, GERD, hypertension, hyperlipidemia.  Admitted in July 2023 for syncope secondary to orthostatic hypotension.  She was started on midodrine and hydrochlorothiazide discontinued.  She presents to the ED complaining of exertional dyspnea and chest tightness, near syncope, and bilateral lower extremity edema.  Hypertensive with systolic in the 811B to 147W.  Desatted to 40s with ambulation.   Patient is reporting 3-day history of exertional dyspnea and chest tightness- about the time her daughter died.  Every time she tries to walk, she feels lightheaded and feels like she will pass out.     Assessment and Plan: Acute on chronic diastolic CHF with Acute hypoxemic respiratory failure Not hypoxic at rest but oxygen saturation dropped to the 80s with ambulation.  BNP 2352.  CTA chest negative for PE; showing bilateral pleural effusions. Echo done in July 2023 showing EF 55 to 29%, grade 1 diastolic dysfunction, mild MR, severe thickening of the aortic valve without evidence of stenosis.  Now EF 30%,  She is not on diuretics at home.   Meds per cards -Monitor intake and output -Daily weights -Low-sodium diet with fluid restriction (eats a lot of fast foods at home) -per cards -does not appear to be be able to take entresto-has been coughing a lot   Chest pain/CAD with multiple stents CTA chest negative for PE.  She does have a new left bundle branch block on EKG.  Troponin mildly elevated in the setting of acute CHF but stable (24> 25) and not consistent with ACS.  Currently chest pain-free and appears comfortable. LHC done April 2022   -cards following  Hypokalemia -repleted  Weakness with left lean -MRI done and did not show CVA -continue PT -check  orthostatics  Episode of a fib -rate controlled -added eliquis  Hypertensive urgency -per cards   Asthma Stable -Continue albuterol as needed   Hyperlipidemia -Continue Zetia   Abnormal urinalysis UA with negative nitrite, small amount of leukocytes, and microscopy showing 6-10 WBCs and no bacteria.  Patient is not endorsing any urinary symptoms.  Grief reaction -daughter died 1 week ago   DVT prophylaxis: apixaban (ELIQUIS) tablet 2.5 mg Start: 08/27/22 1245 apixaban (ELIQUIS) tablet 2.5 mg    Code Status: DNR Family Communication: daughter at bedside; called granddaughter  Disposition Plan:  Level of care: Telemetry Cardiac Status is: Inpatient Remains inpatient appropriate because: needs diuresis    Consultants:  cards   Subjective: Still coughing  Objective: Vitals:   08/30/22 0221 08/30/22 0400 08/30/22 0500 08/30/22 0836  BP: (!) 94/55 (!) 124/90  110/63  Pulse: 85 80  71  Resp: 19 20  20   Temp:  97.8 F (36.6 C)  98.4 F (36.9 C)  TempSrc:  Oral  Axillary  SpO2: 97%   90%  Weight:   52.6 kg   Height:        Intake/Output Summary (Last 24 hours) at 08/30/2022 1116 Last data filed at 08/29/2022 1800 Gross per 24 hour  Intake 260 ml  Output 350 ml  Net -90 ml   Filed Weights   08/28/22 0413 08/29/22 0526 08/30/22 0500  Weight: 53.1 kg 52.7 kg 52.6 kg    Examination:    General: Appearance:    Well developed, well nourished female in  no acute distress     Lungs:     Clear to auscultation bilaterally, respirations unlabored  Heart:    Normal heart rate. Normal rhythm. No murmurs, rubs, or gallops.   MS:   All extremities are intact.   Neurologic:   Awake, alert         Data Reviewed: I have personally reviewed following labs and imaging studies  CBC: Recent Labs  Lab 08/25/22 1918 08/27/22 0028 08/28/22 0012 08/29/22 0902 08/30/22 0040  WBC 7.9 7.6 6.9 9.1 11.1*  NEUTROABS 4.9  --   --   --   --   HGB 11.8* 13.1 13.1 14.2  13.5  HCT 36.5 39.0 38.4 41.7 40.6  MCV 97.9 91.5 91.2 93.3 95.3  PLT 207 236 219 260 [id number]   Basic Metabolic Panel: Recent Labs  Lab 08/26/22 0554 08/27/22 0028 08/27/22 1145 08/28/22 0012 08/29/22 0902 08/30/22 0040  NA 137 133* 131* 129* 129* 130*  K 4.0 2.5* 3.2* 4.7 4.4 4.6  CL 94* 90* 92* 96* 95* 97*  CO2 28 28 27 25 25 24   GLUCOSE 114* 130* 151* 120* 130* 158*  BUN 15 17 18 19 18  25*  CREATININE 0.81 1.00 0.92 0.75 0.89 1.10*  CALCIUM 10.3 9.5 9.1 9.0 9.3 9.3  MG 1.8  --  1.8  --   --   --    GFR: Estimated Creatinine Clearance: 23.5 mL/min (A) (by C-G formula based on SCr of 1.1 mg/dL (H)). Liver Function Tests: Recent Labs  Lab 08/25/22 1918  AST 33  ALT 22  ALKPHOS 70  BILITOT 0.9  PROT 6.8  ALBUMIN 3.5   No results for input(s): "LIPASE", "AMYLASE" in the last 168 hours. No results for input(s): "AMMONIA" in the last 168 hours. Coagulation Profile: No results for input(s): "INR", "PROTIME" in the last 168 hours. Cardiac Enzymes: Recent Labs  Lab 08/28/22 0012  CKTOTAL 246*   BNP (last 3 results) No results for input(s): "PROBNP" in the last 8760 hours. HbA1C: No results for input(s): "HGBA1C" in the last 72 hours. CBG: No results for input(s): "GLUCAP" in the last 168 hours. Lipid Profile: No results for input(s): "CHOL", "HDL", "LDLCALC", "TRIG", "CHOLHDL", "LDLDIRECT" in the last 72 hours. Thyroid Function Tests: No results for input(s): "TSH", "T4TOTAL", "FREET4", "T3FREE", "THYROIDAB" in the last 72 hours. Anemia Panel: No results for input(s): "VITAMINB12", "FOLATE", "FERRITIN", "TIBC", "IRON", "RETICCTPCT" in the last 72 hours. Sepsis Labs: No results for input(s): "PROCALCITON", "LATICACIDVEN" in the last 168 hours.  No results found for this or any previous visit (from the past 240 hour(s)).       Radiology Studies: MR BRAIN WO CONTRAST  Result Date: 08/29/2022 CLINICAL DATA:  Neuro deficit, stroke suspected.  Near syncope. EXAM:  MRI HEAD WITHOUT CONTRAST TECHNIQUE: Multiplanar, multiecho pulse sequences of the brain and surrounding structures were obtained without intravenous contrast. COMPARISON:  MR head without contrast 03/08/2020. CT head without contrast 03/07/2022. FINDINGS: Brain: No acute infarct, hemorrhage, or mass lesion is present. Progressive advanced atrophy and diffuse white matter disease is present. The ventricles are proportionate to the degree of atrophy. The brainstem and cerebellum are within normal limits. Vascular: Flow is present in the major intracranial arteries. Skull and upper cervical spine: Multilevel degenerative changes are present in the upper cervical spine. Craniocervical junction is normal. Midline structures are unremarkable. Sinuses/Orbits: The paranasal sinuses and mastoid air cells are clear. Bilateral lens replacements are noted. Globes and orbits are otherwise unremarkable. IMPRESSION: 1. No  acute intracranial abnormality. 2. Progressive advanced atrophy and diffuse white matter disease. This likely reflects the sequela of chronic microvascular ischemia. Electronically Signed   By: San Morelle M.D.   On: 08/29/2022 17:02   DG CHEST PORT 1 VIEW  Result Date: 08/29/2022 CLINICAL DATA:  Follow-up in patient for CHF.  Cough. EXAM: PORTABLE CHEST 1 VIEW COMPARISON:  CT, 08/26/2022.  Chest radiograph, 08/25/2022. FINDINGS: Cardiac silhouette mildly enlarged. Right hilar prominence stable from the prior studies. No mediastinal or left hilar masses. Interstitial and hazy opacities in the right upper lung, similar to the prior exam. Mild bilateral interstitial prominence, improved compared to the prior chest radiograph. No new lung opacities. Probable small effusions, decreased when compared to the prior chest radiograph and chest CT. No pneumothorax. IMPRESSION: 1. Interval decrease in small bilateral effusions. 2. Interstitial prominence decreased compared to the prior chest radiograph  consistent with improved/resolved interstitial edema. 3. No new abnormalities. Electronically Signed   By: Lajean Manes M.D.   On: 08/29/2022 10:36        Scheduled Meds:  apixaban  2.5 mg Oral BID   clopidogrel  75 mg Oral Daily   ezetimibe  10 mg Oral Daily   famotidine  20 mg Oral QHS   metoprolol tartrate  12.5 mg Oral BID   Continuous Infusions:   LOS: 4 days    Time spent: 45 minutes spent on chart review, discussion with nursing staff, consultants, updating family and interview/physical exam; more than 50% of that time was spent in counseling and/or coordination of care.    Geradine Girt, DO [facility] Available via Epic secure chat 7am-7pm After these hours, please refer to coverage provider listed on [email or web address] 08/30/2022, 11:16 AM
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
## 2022-08-30 NOTE — Progress Notes (Signed)
Physical Therapy Treatment Patient Details Name: [name] MRN: [id number] DOB: 1928-08-02 Today's Date: 08/30/2022   History of Present Illness Pt is a 88 y.o. female admitted 08/25/22 with SOB, chest tightness, BLE swelling. Workup for CHF exacerbation, hypertensive urgency. Chest CTA showing bilateral pleural effusions. PMH includes CAD s/p PCI, HTN, asthma.    PT Comments    PT returns to assist pt back to bed. Pt continues to demonstrate strong posterior lean and remains at a high risk for falls. Pt's visitor reports that the pt is likely to refuse SNF placement, if so the pt will benefit from [place]. If returning home the pt will require significant physical assistance for all transfers. PT recommends the family do not attempt ambulation with the patient until HHPT has had the opportunity to work with the patient.   Recommendations for follow up therapy are one component of a multi-disciplinary discharge planning process, led by the attending physician.  Recommendations may be updated based on patient status, additional functional criteria and insurance authorization.  Follow Up Recommendations  Skilled nursing-short term rehab (<3 hours/day) Can patient physically be transported by private vehicle: No   Assistance Recommended at Discharge Frequent or constant Supervision/Assistance  Patient can return home with the following A lot of help with walking and/or transfers;A lot of help with bathing/dressing/bathroom;Assistance with cooking/housework;Assist for transportation;Help with stairs or ramp for entrance   Equipment Recommendations  Wheelchair (measurements PT);Wheelchair cushion (measurements PT)    Recommendations for Other Services       Precautions / Restrictions Precautions Precautions: Fall Restrictions Weight Bearing Restrictions: No     Mobility  Bed Mobility Overal bed mobility: Needs Assistance Bed Mobility: Sit to Supine     Supine to sit: HOB  elevated, Mod assist Sit to supine: Max assist, HOB elevated   General bed mobility comments: verbal cues for sequencing, assist for LE mobility and to pull trunk into sitting, PT utilizing pad to assist in squaring hips to edge of bed    Transfers Overall transfer level: Needs assistance Equipment used: 1 person hand held assist Transfers: Sit to/from Stand, Bed to chair/wheelchair/BSC Sit to Stand: Mod assist Stand pivot transfers: Mod assist   Squat pivot transfers: Max assist     General transfer comment: pt with anterior sliding of L foot, posterior lean, requires max facilitation from PT to pivot    Ambulation/Gait                   Stairs             Wheelchair Mobility    Modified Rankin (Stroke Patients Only)       Balance Overall balance assessment: Needs assistance Sitting-balance support: No upper extremity supported, Feet supported Sitting balance-Leahy Scale: Fair Sitting balance - Comments: minA to minG   Standing balance support: Bilateral upper extremity supported Standing balance-Leahy Scale: Poor Standing balance comment: modA, posterior lean                            Cognition Arousal/Alertness: Awake/alert Behavior During Therapy: Flat affect Overall Cognitive Status: Impaired/Different from baseline Area of Impairment: Orientation, Attention, Memory, Following commands, Safety/judgement, Awareness, Problem solving                 Orientation Level: Disoriented to, Time Current Attention Level: Sustained Memory: Decreased recall of precautions, Decreased short-term memory Following Commands: Follows one step commands with increased time, Follows one step commands inconsistently  Safety/Judgement: Decreased awareness of safety, Decreased awareness of deficits Awareness: Intellectual Problem Solving: Slow processing, Difficulty sequencing, Decreased initiation, Requires verbal cues General Comments: decreased  LOA, difficutly maintaing eyes open. confused throughout but follows commands with increased time and cues. When asked what color certain items were she got it incorrect 5x        Exercises      General Comments General comments (skin integrity, edema, etc.): VSS on RA      Pertinent Vitals/Pain Pain Assessment Pain Assessment: No/denies pain Faces Pain Scale: Hurts a little bit Pain Location: throat Pain Descriptors / Indicators: Sore Pain Intervention(s): Monitored during session    Home Living                          Prior Function            PT Goals (current goals can now be found in the care plan section) Acute Rehab PT Goals Patient Stated Goal: to go home Progress towards PT goals: Not progressing toward goals - comment    Frequency    Min 3X/week      PT Plan Discharge plan needs to be updated    Co-evaluation              AM-PAC PT "6 Clicks" Mobility   Outcome Measure  Help needed turning from your back to your side while in a flat bed without using bedrails?: A Little Help needed moving from lying on your back to sitting on the side of a flat bed without using bedrails?: A Lot Help needed moving to and from a bed to a chair (including a wheelchair)?: A Lot Help needed standing up from a chair using your arms (e.g., wheelchair or bedside chair)?: A Lot Help needed to walk in hospital room?: Total Help needed climbing 3-5 steps with a railing? : Total 6 Click Score: 11    End of Session   Activity Tolerance: Patient limited by fatigue Patient left: in bed;with call bell/phone within reach;with bed alarm set;with family/visitor present Nurse Communication: Mobility status PT Visit Diagnosis: Unsteadiness on feet (R26.81);Muscle weakness (generalized) (M62.81);Difficulty in walking, not elsewhere classified (R26.2)     Time: 6734-1937 PT Time Calculation (min) (ACUTE ONLY): 14 min  Charges:  $Therapeutic Activity: 8-22  mins                     Zenaida Niece, PT, DPT Acute Rehabilitation Office [place] Caroly Purewal 08/30/2022, 4:58 PM

## 2022-08-31 ENCOUNTER — Inpatient Hospital Stay (HOSPITAL_COMMUNITY): Payer: Medicare Other

## 2022-08-31 DIAGNOSIS — I160 Hypertensive urgency: Secondary | ICD-10-CM | POA: Diagnosis not present

## 2022-08-31 DIAGNOSIS — I2510 Atherosclerotic heart disease of native coronary artery without angina pectoris: Secondary | ICD-10-CM

## 2022-08-31 DIAGNOSIS — R0609 Other forms of dyspnea: Secondary | ICD-10-CM | POA: Diagnosis not present

## 2022-08-31 DIAGNOSIS — Z7189 Other specified counseling: Secondary | ICD-10-CM

## 2022-08-31 DIAGNOSIS — J9601 Acute respiratory failure with hypoxia: Secondary | ICD-10-CM

## 2022-08-31 DIAGNOSIS — Z66 Do not resuscitate: Secondary | ICD-10-CM

## 2022-08-31 DIAGNOSIS — Z515 Encounter for palliative care: Secondary | ICD-10-CM

## 2022-08-31 DIAGNOSIS — I5033 Acute on chronic diastolic (congestive) heart failure: Secondary | ICD-10-CM

## 2022-08-31 DIAGNOSIS — R079 Chest pain, unspecified: Secondary | ICD-10-CM

## 2022-08-31 DIAGNOSIS — I5041 Acute combined systolic (congestive) and diastolic (congestive) heart failure: Secondary | ICD-10-CM | POA: Diagnosis not present

## 2022-08-31 DIAGNOSIS — I480 Paroxysmal atrial fibrillation: Secondary | ICD-10-CM

## 2022-08-31 LAB — CBC
HCT: 39.3 % (ref 36.0–46.0)
Hemoglobin: 13.2 g/dL (ref 12.0–15.0)
MCH: 31.6 pg (ref 26.0–34.0)
MCHC: 33.6 g/dL (ref 30.0–36.0)
MCV: 94 fL (ref 80.0–100.0)
Platelets: 259 10*3/uL (ref 150–400)
RBC: 4.18 MIL/uL (ref 3.87–5.11)
RDW: 13.9 % (ref 11.5–15.5)
WBC: 11.8 10*3/uL — ABNORMAL HIGH (ref 4.0–10.5)
nRBC: 0 % (ref 0.0–0.2)

## 2022-08-31 LAB — BASIC METABOLIC PANEL
Anion gap: 11 (ref 5–15)
BUN: 30 mg/dL — ABNORMAL HIGH (ref 8–23)
CO2: 25 mmol/L (ref 22–32)
Calcium: 9.5 mg/dL (ref 8.9–10.3)
Chloride: 96 mmol/L — ABNORMAL LOW (ref 98–111)
Creatinine, Ser: 0.92 mg/dL (ref 0.44–1.00)
GFR, Estimated: 58 mL/min — ABNORMAL LOW (ref >60)
Glucose, Bld: 84 mg/dL (ref 70–99)
Potassium: 4.1 mmol/L (ref 3.5–5.1)
Sodium: 132 mmol/L — ABNORMAL LOW (ref 135–145)

## 2022-08-31 MED ORDER — AMIODARONE HCL 200 MG PO TABS
200.0000 mg | ORAL_TABLET | Freq: Every day | ORAL | Status: DC
  Administered 2022-08-31 – 2022-09-02 (×3): 200 mg via ORAL
  Filled 2022-08-31 (×3): qty 1

## 2022-08-31 MED ORDER — CARVEDILOL 3.125 MG PO TABS
3.1250 mg | ORAL_TABLET | Freq: Two times a day (BID) | ORAL | Status: DC
  Administered 2022-08-31 – 2022-09-02 (×5): 3.125 mg via ORAL
  Filled 2022-08-31 (×5): qty 1

## 2022-08-31 NOTE — Progress Notes (Signed)
Mobility Specialist Progress Note    08/31/22 1550  Mobility  Activity Ambulated with assistance in room  Level of Assistance Moderate assist, patient does 50-74%  Assistive Device Front wheel walker  Distance Ambulated (ft) 3 ft  Activity Response Tolerated well  Mobility Referral Yes  $Mobility charge 1 Mobility   Pt received in bed and agreeable. No complaints. Left in chair with call bell in reach and daughter present.   [name] Mobility Specialist

## 2022-08-31 NOTE — Progress Notes (Signed)
Cardiology Progress Note  Patient ID: [name] MRN: [id number] DOB: [birth year] Date of Encounter: 08/31/2022  Primary Cardiologist: [name], MD  Subjective   Chief Complaint: Weakness   HPI: Feels poorly. Euvolemic today. Paroxysmal Aflutter overnight.   ROS:  All other ROS reviewed and negative. Pertinent positives noted in the HPI.     Inpatient Medications  Scheduled Meds:  apixaban  2.5 mg Oral BID   clopidogrel  75 mg Oral Daily   ezetimibe  10 mg Oral Daily   famotidine  20 mg Oral QHS   metoprolol tartrate  12.5 mg Oral BID   Continuous Infusions:  PRN Meds: acetaminophen **OR** acetaminophen, benzonatate, haloperidol lactate, hydrALAZINE, ipratropium-albuterol, melatonin, metoprolol tartrate   Vital Signs   Vitals:   08/30/22 1243 08/30/22 1945 08/31/22 0500 08/31/22 0830  BP: (!) 117/58   137/64  Pulse: 69   67  Resp:    20  Temp:  99.2 F (37.3 C)  98.8 F (37.1 C)  TempSrc:  Axillary  Oral  SpO2:      Weight:   52.7 kg   Height:        Intake/Output Summary (Last 24 hours) at 08/31/2022 0909 Last data filed at 08/30/2022 1945 Gross per 24 hour  Intake 480 ml  Output 300 ml  Net 180 ml      08/31/2022    5:00 AM 08/30/2022    5:00 AM 08/29/2022    5:26 AM  Last 3 Weights  Weight (lbs) 116 lb 2.9 oz 115 lb 15.4 oz 116 lb 2.9 oz  Weight (kg) 52.7 kg 52.6 kg 52.7 kg      Telemetry  Overnight telemetry shows SR 70s, paroxysmal a flutter overnight , which I personally reviewed.   ECG  The most recent ECG shows Aflutter 110 bpm, which I personally reviewed.   Physical Exam   Vitals:   08/30/22 1243 08/30/22 1945 08/31/22 0500 08/31/22 0830  BP: (!) 117/58   137/64  Pulse: 69   67  Resp:    20  Temp:  99.2 F (37.3 C)  98.8 F (37.1 C)  TempSrc:  Axillary  Oral  SpO2:      Weight:   52.7 kg   Height:        Intake/Output Summary (Last 24 hours) at 08/31/2022 0909 Last data filed at 08/30/2022 1945 Gross per 24 hour   Intake 480 ml  Output 300 ml  Net 180 ml       08/31/2022    5:00 AM 08/30/2022    5:00 AM 08/29/2022    5:26 AM  Last 3 Weights  Weight (lbs) 116 lb 2.9 oz 115 lb 15.4 oz 116 lb 2.9 oz  Weight (kg) 52.7 kg 52.6 kg 52.7 kg    Body mass index is 24.28 kg/m.  General: Well nourished, well developed, in no acute distress Head: Atraumatic, normal size  Eyes: PEERLA, EOMI  Neck: Supple, no JVD Endocrine: No thryomegaly Cardiac: Normal S1, S2; RRR; no murmurs, rubs, or gallops Lungs: Clear to auscultation bilaterally, no wheezing, rhonchi or rales  Abd: Soft, nontender, no hepatomegaly  Ext: No edema, pulses 2+ Musculoskeletal: No deformities, BUE and BLE strength normal and equal Skin: Warm and dry, no rashes   Neuro: Alert and oriented to person, place, time, and situation, CNII-XII grossly intact, no focal deficits  Psych: Normal mood and affect   Labs  High Sensitivity Troponin:   Recent Labs  Lab 08/25/22 1918 08/26/22 0040  TROPONINIHS 24* 25*     Cardiac EnzymesNo results for input(s): "TROPONINI" in the last 168 hours. No results for input(s): "TROPIPOC" in the last 168 hours.  Chemistry Recent Labs  Lab 08/25/22 1918 08/26/22 0554 08/29/22 0902 08/30/22 0040 08/31/22 0036  NA 135   < > 129* 130* 132*  K 4.1   < > 4.4 4.6 4.1  CL 100   < > 95* 97* 96*  CO2 24   < > 25 24 25   GLUCOSE 111*   < > 130* 158* 84  BUN 16   < > 18 25* 30*  CREATININE 0.79   < > 0.89 1.10* 0.92  CALCIUM 9.5   < > 9.3 9.3 9.5  PROT 6.8  --   --   --   --   ALBUMIN 3.5  --   --   --   --   AST 33  --   --   --   --   ALT 22  --   --   --   --   ALKPHOS 70  --   --   --   --   BILITOT 0.9  --   --   --   --   GFRNONAA >60   < > >60 47* 58*  ANIONGAP 11   < > 9 9 11    < > = values in this interval not displayed.    Hematology Recent Labs  Lab 08/29/22 0902 08/30/22 0040 08/31/22 0036  WBC 9.1 11.1* 11.8*  RBC 4.47 4.26 4.18  HGB 14.2 13.5 13.2  HCT 41.7 40.6 39.3  MCV 93.3  95.3 94.0  MCH 31.8 31.7 31.6  MCHC 34.1 33.3 33.6  RDW 14.0 14.0 13.9  PLT 260 281 259   BNP Recent Labs  Lab 08/25/22 1918  BNP 2,352.3*    DDimer No results for input(s): "DDIMER" in the last 168 hours.   Radiology  MR BRAIN WO CONTRAST  Result Date: 08/29/2022 CLINICAL DATA:  Neuro deficit, stroke suspected.  Near syncope. EXAM: MRI HEAD WITHOUT CONTRAST TECHNIQUE: Multiplanar, multiecho pulse sequences of the brain and surrounding structures were obtained without intravenous contrast. COMPARISON:  MR head without contrast 03/08/2020. CT head without contrast 03/07/2022. FINDINGS: Brain: No acute infarct, hemorrhage, or mass lesion is present. Progressive advanced atrophy and diffuse white matter disease is present. The ventricles are proportionate to the degree of atrophy. The brainstem and cerebellum are within normal limits. Vascular: Flow is present in the major intracranial arteries. Skull and upper cervical spine: Multilevel degenerative changes are present in the upper cervical spine. Craniocervical junction is normal. Midline structures are unremarkable. Sinuses/Orbits: The paranasal sinuses and mastoid air cells are clear. Bilateral lens replacements are noted. Globes and orbits are otherwise unremarkable. IMPRESSION: 1. No acute intracranial abnormality. 2. Progressive advanced atrophy and diffuse white matter disease. This likely reflects the sequela of chronic microvascular ischemia. Electronically Signed   By: San Morelle M.D.   On: 08/29/2022 17:02   DG CHEST PORT 1 VIEW  Result Date: 08/29/2022 CLINICAL DATA:  Follow-up in patient for CHF.  Cough. EXAM: PORTABLE CHEST 1 VIEW COMPARISON:  CT, 08/26/2022.  Chest radiograph, 08/25/2022. FINDINGS: Cardiac silhouette mildly enlarged. Right hilar prominence stable from the prior studies. No mediastinal or left hilar masses. Interstitial and hazy opacities in the right upper lung, similar to the prior exam. Mild bilateral  interstitial prominence, improved compared to the prior chest radiograph. No new lung opacities. Probable small  effusions, decreased when compared to the prior chest radiograph and chest CT. No pneumothorax. IMPRESSION: 1. Interval decrease in small bilateral effusions. 2. Interstitial prominence decreased compared to the prior chest radiograph consistent with improved/resolved interstitial edema. 3. No new abnormalities. Electronically Signed   By: Lajean Manes M.D.   On: 08/29/2022 10:36    Cardiac Studies  TTE 08/26/2022  1. Left ventricular ejection fraction, by estimation, is 30 to 35%. The  left ventricle has moderately decreased function. The left ventricle  demonstrates regional wall motion abnormalities with septal hypokinesis  and septal-lateral dyssynchrony  suggestive of LBBB. There is mild concentric left ventricular hypertrophy.  Left ventricular diastolic parameters are consistent with Grade III  diastolic dysfunction (restrictive).   2. Right ventricular systolic function is mildly reduced. The right  ventricular size is normal. There is moderately elevated pulmonary artery  systolic pressure. The estimated right ventricular systolic pressure is  [id number] mmHg.   3. Left atrial size was moderately dilated.   4. The mitral valve is normal in structure. Trivial mitral valve  regurgitation. No evidence of mitral stenosis.   5. The aortic valve is tricuspid. There is moderate calcification of the  aortic valve. Aortic valve regurgitation is trivial. No aortic stenosis is  present.   6. Aortic dilatation noted. There is mild dilatation of the ascending  aorta, measuring 39 mm.   7. The inferior vena cava is normal in size with greater than 50%  respiratory variability, suggesting right atrial pressure of 3 mmHg.   LHC 03/07/2021 Progression of proximal LAD 30% stenosis to 50 to 60%.  The LAD stent is widely patent in the mid vessel.  There is severe diffuse disease with high-grade  obstruction in the mid to distal LAD.  There is also a severely diseased diagonal.  This distal disease is not a reasonable target for PCI. The circumflex is a tortuous vessel, calcified, and has 60 to 80% eccentric stenosis in the proximal one third.  Not significantly different than prior angiography. The right coronary artery is a dominant vessel that has severe diffuse disease.  The previously restented proximal to mid segment is patent with 40 to 50% narrowing in the proximal vessel.  Appearance is unchanged compared to follow images in July 2021. Normal LV function.  Normal LVEDP. Left main is widely patent. Tortuous brachial, radial, and innominate anatomy.  Patient Profile  [name] is a 88 y.o. female with CAD, diastolic heart failure, GERD, hypertension who was admitted on [id number] for acute systolic heart failure and new left bundle branch block.  Course is also been complicated by atrial fibrillation with RVR.  Assessment & Plan   #Acute systolic heart failure, EF 30-35% #New left bundle branch block -Suspect her ejection fraction reduction is related to her left bundle branch block and salvos of atrial fibrillation.  Also has severe CAD.  See discussion below.  Given her advanced age would not recommend repeat heart catheterization.  Left heart catheterization April 2022 which largely nonobstructive disease. -Started on amiodarone 200 mg daily.  Given her age likely not a good candidate for aggressive rhythm control. -Continue Eliquis 2.5 mg twice daily. -She has been intolerant of most of her heart failure medications.  Really given her age unclear benefit of aggressive care.  Transition to Coreg 3.125 mg twice daily.  She seems to be tolerating low-dose beta-blocker.  Would not add other medications.  She really just wants to get out of the hospital.  #Left bundle branch  block -Clear contributing to her cardiomyopathy.  She has significant dyssynchrony.  Team over the  weekend was recommending BiV pacer but I think we should just go with a conservative approach.   #Paroxymal Afib/Aflutter -Continues to have paroxysmal atrial flutter.  We will start her on amiodarone 200 mg daily.  I suspect this could be contributing to her cardiomyopathy.  Given her advanced age would recommend medications. -Continue Eliquis 2.5 mg twice daily.  #CAD status post PCI -Left heart catheterization in April of last year.  Proximal LAD was 60%.  Severe distal LAD disease.  50% in-stent restenosis of the RCA.  50% distal RCA disease.  There is right to left collateral flow of the distal LAD.  Severe disease in the circumflex. -Clearly her CAD could be contributing however given her advanced age and lack of anginal symptoms would recommend medical therapy for now. -No aspirin as she is on Plavix and Eliquis. -Statin intolerant. Zetia 10 mg daily.    For questions or updates, please contact [place] Please consult [email or web address] for contact info under   Signed, [place] T. Audie Box, MD, [place]  08/31/2022 9:09 AM

## 2022-08-31 NOTE — Plan of Care (Signed)
  Problem: Activity: Goal: Capacity to carry out activities will improve Outcome: Progressing   Problem: Cardiac: Goal: Ability to achieve and maintain adequate cardiopulmonary perfusion will improve Outcome: Progressing   Problem: Education: Goal: Knowledge of General Education information will improve Description: Including pain rating scale, medication(s)/side effects and non-pharmacologic comfort measures Outcome: Progressing   Problem: Health Behavior/Discharge Planning: Goal: Ability to manage health-related needs will improve Outcome: Progressing   Problem: Clinical Measurements: Goal: Ability to maintain clinical measurements within normal limits will improve Outcome: Progressing Goal: Will remain free from infection Outcome: Progressing Goal: Diagnostic test results will improve Outcome: Progressing   Problem: Coping: Goal: Level of anxiety will decrease Outcome: Progressing

## 2022-08-31 NOTE — Plan of Care (Signed)
  Problem: Clinical Measurements: Goal: Respiratory complications will improve Outcome: Progressing Goal: Cardiovascular complication will be avoided Outcome: Progressing   Problem: Elimination: Goal: Will not experience complications related to bowel motility Outcome: Progressing Goal: Will not experience complications related to urinary retention Outcome: Progressing   Problem: Activity: Goal: Capacity to carry out activities will improve Outcome: Not Progressing   Problem: Activity: Goal: Risk for activity intolerance will decrease Outcome: Not Progressing   Problem: Nutrition: Goal: Adequate nutrition will be maintained Outcome: Not Progressing

## 2022-08-31 NOTE — Progress Notes (Signed)
Modified Barium Swallow Progress Note  Patient Details  Name: [name] MRN: [id number] Date of Birth: [birth year]  Today's Date: 08/31/2022  Modified Barium Swallow completed.  Full report located under Chart Review in the Imaging Section.  Brief recommendations include the following:  Clinical Impression  Pt has minimal oropharyngeal dysphagia in the setting of what is also suspected to be age-related changes. She has reduced lingual propulsion with some increased effort to propel boluses posteriorly. Interestingly she has lingual residue primarily with thin liquids, although also noted with a barium tablet. She cleared the pill readily with an additional liquid wash. Thin liquid residuals spill posteriorly to her vallecuale post-swallow, but she also clears this well. Pt has trace penetration with thin liquids that enter the laryngeal vestibule before she is able to achieve closure. However, her closure is sufficient in squeezing penetrates back out completely. No frank penetration remains and no aspiration occurs (also note that pt did not cough throughout the duration of her time in radiology). Recommend that pt remain on regular solids and thin liquids. SLP to sign off acutely.   Swallow Evaluation Recommendations       SLP Diet Recommendations: Regular solids;Thin liquid   Liquid Administration via: Cup;Straw   Medication Administration: Whole meds with liquid   Supervision: Staff to assist with self feeding   Compensations: Slow rate;Small sips/bites;Minimize environmental distractions   Postural Changes: Remain semi-upright after after feeds/meals (Comment);Seated upright at 90 degrees   Oral Care Recommendations: Oral care BID        Osie Bond., M.A. [place] Office [phone]  Secure chat preferred  08/31/2022,3:59 PM

## 2022-08-31 NOTE — Progress Notes (Addendum)
Heart Failure Stewardship Pharmacist Progress Note   PCP: Nche, Charlene Brooke, NP PCP-Cardiologist: [name], MD    HPI:  88 yo F with PMH of CAD s/p PCI, CHF, asthma, GERD, HTN, and HLD.  Recently admitted from 7/11-7/15 with syncopal episode likely due to orthostatic hypotension. ECHO at that time with LVEF 55-60%, mild LVH, RV normal. Discharged on midodrine and HCTZ stopped.   Presented to the ED on 10/3 with shortness of breath, HTN, hypoxia, LE edema, and near syncope. CXR with cardiomegaly, chronic appearing increased interstitial lung markings, and small bilateral pleural effusions. CTA negative for PE. ECHO 10/4 with newly reduced LVEF 30-35%, mild LVH with G3DD, RV mildly reduced.  Current HF Medications: Beta Blocker: carvedilol 3.125 mg BID  Prior to admission HF Medications: ACE/ARB/ARNI: losartan 12.5 mg daily *also taking midodrine 10 mg TID for orthostatic hypotension  Pertinent Lab Values: Serum creatinine 0.92, BUN 30, Potassium 4.1, Sodium 132, BNP 2352.3  Vital Signs: Weight: 116 lbs - bed weight (admission weight: 121 lbs) Blood pressure: 130/60s Heart rate: 60-70s   Medication Assistance / Insurance Benefits Check: Does the patient have prescription insurance?  Yes Type of insurance plan: UHC Medicare  Does the patient qualify for medication assistance through manufacturers or grants?   Yes Eligible grants and/or patient assistance programs: Lisabeth Register Medication assistance applications in progress: Lindi Adie  Medication assistance applications approved: none Approved medication assistance renewals will be completed by: pending  Outpatient Pharmacy:  Prior to admission outpatient pharmacy: [facility] Is the patient willing to use [place] at discharge? Yes Is the patient willing to transition their outpatient pharmacy to utilize a [facility] outpatient pharmacy?   Pending    Assessment: 1. Acute on chronic systolic and diastolic  CHF (LVEF 40-08%). NYHA class III symptoms. - Off IV lasix, euvolemic. Bed weight documented today. Keep K>4. - Agree with adding carvedilol 3.125 mg BID - Off spironolactone, Entresto, and Farxiga with low BP. Experienced cough with Delene Loll (previously had the same with lisinopril). - Suspected that midodrine will not need to be continued, orthostatic symptoms may be related to poor oral intake/malnutrition - Conservative approach given age and comorbidities, could consider adding Farxiga back since this will likely have null BP effect.  Plan: 1) Medication changes recommended at this time: - Agree with changes, consider restarting Farxiga 10 mg daily   2) Patient assistance: - Currently in coverage gap (donut hole) - Initial Farxiga copay $99 - Initial Jardiance copay $110 - Patient assistance initiated for Entresto and Farxiga - off both meds at this time  3)  Education  - Patient and family have been educated on current HF medications and potential additions to HF medication regimen - Patient verbalizes understanding that over the next few months, these medication doses may change and more medications may be added to optimize HF regimen - Patient and family have been educated on basic disease state pathophysiology and goals of therapy   Kerby Nora, PharmD, BCPS Heart Failure Cytogeneticist Phone [phone]

## 2022-08-31 NOTE — TOC Transition Note (Signed)
3 Discharge prescriptions were filled by [facility] TOC Rx and are secured in the [place] awaiting pt discharge.

## 2022-08-31 NOTE — Progress Notes (Signed)
PROGRESS NOTE    [name] Patry Kronk  [id number] DOB: 1928-08-02 DOA: 08/25/2022 PCP: [name], NP    Brief Narrative:  [name] is a 88 y.o. female with medical history significant of CAD status post PCI, HFpEF, asthma, GERD, hypertension, hyperlipidemia.  Admitted in July 2023 for syncope secondary to orthostatic hypotension.  She was started on midodrine and hydrochlorothiazide discontinued.  She presents to the ED complaining of exertional dyspnea and chest tightness, near syncope, and bilateral lower extremity edema.  Hypertensive with systolic in the [id number] to [id number].  Desatted to 9s with ambulation.   Patient is reporting 3-day history of exertional dyspnea and chest tightness- about the time her daughter died.  Every time she tries to walk, she feels lightheaded and feels like she will pass out.  One of her daughters died last weekend.    Assessment and Plan: Acute on chronic diastolic CHF with Acute hypoxemic respiratory failure Not hypoxic at rest but oxygen saturation dropped to the 80s with ambulation.  BNP 2352.  CTA chest negative for PE; showing bilateral pleural effusions. Echo done in July 2023 showing EF 55 to 123456, grade 1 diastolic dysfunction, mild MR, severe thickening of the aortic valve without evidence of stenosis.  Now EF 30%,  She is not on diuretics at home.   Meds per cards -Monitor intake and output -Daily weights -Low-sodium diet with fluid restriction (eats a lot of fast foods at home) -per cards -did not tolerate entresto   Chest pain/CAD with multiple stents CTA chest negative for PE.  She does have a new left bundle branch block on EKG.  Troponin mildly elevated in the setting of acute CHF but stable (24> 25) and not consistent with ACS.  Currently chest pain-free and appears comfortable. LHC done April 2022   -cards following  Hypokalemia -repleted  Weakness with left lean -MRI done and did not show CVA -continue PT -check  orthostatics  Episode of a fib -rate controlled -added eliquis  Hypertensive urgency -per cards   Asthma Stable -Continue albuterol as needed   Hyperlipidemia -Continue Zetia   Abnormal urinalysis UA with negative nitrite, small amount of leukocytes, and microscopy showing 6-10 WBCs and no bacteria.  Patient is not endorsing any urinary symptoms.  Grief reaction -daughter died 1 week ago -palliative care consult  DVT prophylaxis: apixaban (ELIQUIS) tablet 2.5 mg Start: 08/27/22 1245 apixaban (ELIQUIS) tablet 2.5 mg    Code Status: DNR Family Communication: daughter at bedside; called granddaughter  Disposition Plan:  Level of care: Telemetry Cardiac Status is: Inpatient Remains inpatient appropriate because: needs diuresis    Consultants:  cards   Subjective: Does not want to talk about daughter's death  Objective: Vitals:   2022-09-10 1945 08/31/22 0500 08/31/22 0830 08/31/22 0952  BP:   137/64 138/61  Pulse:   67 72  Resp:   20   Temp: 99.2 F (37.3 C)  98.8 F (37.1 C)   TempSrc: Axillary  Oral   SpO2:      Weight:  52.7 kg    Height:        Intake/Output Summary (Last 24 hours) at 08/31/2022 1218 Last data filed at 09/10/2022 1945 Gross per 24 hour  Intake 240 ml  Output 300 ml  Net -60 ml   Filed Weights   08/29/22 0526 09/10/22 0500 08/31/22 0500  Weight: 52.7 kg 52.6 kg 52.7 kg    Examination:     General: Appearance:    elderly female  in no acute distress     Lungs:    respirations unlabored  Heart:    Normal heart rate. Normal rhythm. No murmurs, rubs, or gallops.   MS:   All extremities are intact.   Neurologic:   Awake, alert           Data Reviewed: I have personally reviewed following labs and imaging studies  CBC: Recent Labs  Lab 08/25/22 1918 08/27/22 0028 08/28/22 0012 08/29/22 0902 08/30/22 0040 08/31/22 0036  WBC 7.9 7.6 6.9 9.1 11.1* 11.8*  NEUTROABS 4.9  --   --   --   --   --   HGB 11.8* 13.1 13.1 14.2  13.5 13.2  HCT 36.5 39.0 38.4 41.7 40.6 39.3  MCV 97.9 91.5 91.2 93.3 95.3 94.0  PLT 207 236 219 260 281 [id number]   Basic Metabolic Panel: Recent Labs  Lab 08/26/22 0554 08/27/22 0028 08/27/22 1145 08/28/22 0012 08/29/22 0902 08/30/22 0040 08/31/22 0036  NA 137   < > 131* 129* 129* 130* 132*  K 4.0   < > 3.2* 4.7 4.4 4.6 4.1  CL 94*   < > 92* 96* 95* 97* 96*  CO2 28   < > 27 25 25 24 25   GLUCOSE 114*   < > 151* 120* 130* 158* 84  BUN 15   < > 18 19 18  25* 30*  CREATININE 0.81   < > 0.92 0.75 0.89 1.10* 0.92  CALCIUM 10.3   < > 9.1 9.0 9.3 9.3 9.5  MG 1.8  --  1.8  --   --   --   --    < > = values in this interval not displayed.   GFR: Estimated Creatinine Clearance: 28.1 mL/min (by C-G formula based on SCr of 0.92 mg/dL). Liver Function Tests: Recent Labs  Lab 08/25/22 1918  AST 33  ALT 22  ALKPHOS 70  BILITOT 0.9  PROT 6.8  ALBUMIN 3.5   No results for input(s): "LIPASE", "AMYLASE" in the last 168 hours. No results for input(s): "AMMONIA" in the last 168 hours. Coagulation Profile: No results for input(s): "INR", "PROTIME" in the last 168 hours. Cardiac Enzymes: Recent Labs  Lab 08/28/22 0012  CKTOTAL 246*   BNP (last 3 results) No results for input(s): "PROBNP" in the last 8760 hours. HbA1C: No results for input(s): "HGBA1C" in the last 72 hours. CBG: No results for input(s): "GLUCAP" in the last 168 hours. Lipid Profile: No results for input(s): "CHOL", "HDL", "LDLCALC", "TRIG", "CHOLHDL", "LDLDIRECT" in the last 72 hours. Thyroid Function Tests: No results for input(s): "TSH", "T4TOTAL", "FREET4", "T3FREE", "THYROIDAB" in the last 72 hours. Anemia Panel: No results for input(s): "VITAMINB12", "FOLATE", "FERRITIN", "TIBC", "IRON", "RETICCTPCT" in the last 72 hours. Sepsis Labs: No results for input(s): "PROCALCITON", "LATICACIDVEN" in the last 168 hours.  Recent Results (from the past 240 hour(s))  SARS Coronavirus 2 by RT PCR (hospital order, performed  in [facility] hospital lab) *cepheid single result test* Anterior Nasal Swab     Status: None   Collection Time: 08/30/22  1:25 PM   Specimen: Anterior Nasal Swab  Result Value Ref Range Status   SARS Coronavirus 2 by RT PCR NEGATIVE NEGATIVE Final    Comment: (NOTE) SARS-CoV-2 target nucleic acids are NOT DETECTED.  The SARS-CoV-2 RNA is generally detectable in upper and lower respiratory specimens during the acute phase of infection. The lowest concentration of SARS-CoV-2 viral copies this assay can detect is 250 copies / mL.  A negative result does not preclude SARS-CoV-2 infection and should not be used as the sole basis for treatment or other patient management decisions.  A negative result may occur with improper specimen collection / handling, submission of specimen other than nasopharyngeal swab, presence of viral mutation(s) within the areas targeted by this assay, and inadequate number of viral copies (<250 copies / mL). A negative result must be combined with clinical observations, patient history, and epidemiological information.  Fact Sheet for Patients:   https://www.patel.info/  Fact Sheet for Healthcare Providers: https://hall.com/  This test is not yet approved or  cleared by the [place] FDA and has been authorized for detection and/or diagnosis of SARS-CoV-2 by FDA under an Emergency Use Authorization (EUA).  This EUA will remain in effect (meaning this test can be used) for the duration of the COVID-19 declaration under Section 564(b)(1) of the Act, 21 U.S.C. section 360bbb-3(b)(1), unless the authorization is terminated or revoked sooner.  Performed at [facility], [place] [address] [place], [place] [address]   Respiratory (~20 pathogens) panel by PCR     Status: None   Collection Time: 08/30/22  2:05 PM   Specimen: Nasopharyngeal Swab; Respiratory  Result Value Ref Range Status   Adenovirus NOT DETECTED  NOT DETECTED Final   Coronavirus 229E NOT DETECTED NOT DETECTED Final    Comment: (NOTE) The Coronavirus on the Respiratory Panel, DOES NOT test for the novel  Coronavirus (2019 nCoV)    Coronavirus HKU1 NOT DETECTED NOT DETECTED Final   Coronavirus NL63 NOT DETECTED NOT DETECTED Final   Coronavirus OC43 NOT DETECTED NOT DETECTED Final   Metapneumovirus NOT DETECTED NOT DETECTED Final   Rhinovirus / Enterovirus NOT DETECTED NOT DETECTED Final   Influenza A NOT DETECTED NOT DETECTED Final   Influenza B NOT DETECTED NOT DETECTED Final   Parainfluenza Virus 1 NOT DETECTED NOT DETECTED Final   Parainfluenza Virus 2 NOT DETECTED NOT DETECTED Final   Parainfluenza Virus 3 NOT DETECTED NOT DETECTED Final   Parainfluenza Virus 4 NOT DETECTED NOT DETECTED Final   Respiratory Syncytial Virus NOT DETECTED NOT DETECTED Final   Bordetella pertussis NOT DETECTED NOT DETECTED Final   Bordetella Parapertussis NOT DETECTED NOT DETECTED Final   Chlamydophila pneumoniae NOT DETECTED NOT DETECTED Final   Mycoplasma pneumoniae NOT DETECTED NOT DETECTED Final    Comment: Performed at [facility], [place]. [address] [place],  [address]         Radiology Studies: MR BRAIN WO CONTRAST  Result Date: 08/29/2022 CLINICAL DATA:  Neuro deficit, stroke suspected.  Near syncope. EXAM: MRI HEAD WITHOUT CONTRAST TECHNIQUE: Multiplanar, multiecho pulse sequences of the brain and surrounding structures were obtained without intravenous contrast. COMPARISON:  MR head without contrast 03/08/2020. CT head without contrast 03/07/2022. FINDINGS: Brain: No acute infarct, hemorrhage, or mass lesion is present. Progressive advanced atrophy and diffuse white matter disease is present. The ventricles are proportionate to the degree of atrophy. The brainstem and cerebellum are within normal limits. Vascular: Flow is present in the major intracranial arteries. Skull and upper cervical spine: Multilevel degenerative  changes are present in the upper cervical spine. Craniocervical junction is normal. Midline structures are unremarkable. Sinuses/Orbits: The paranasal sinuses and mastoid air cells are clear. Bilateral lens replacements are noted. Globes and orbits are otherwise unremarkable. IMPRESSION: 1. No acute intracranial abnormality. 2. Progressive advanced atrophy and diffuse white matter disease. This likely reflects the sequela of chronic microvascular ischemia. Electronically Signed   By:  San Morelle M.D.   On: 08/29/2022 17:02        Scheduled Meds:  amiodarone  200 mg Oral Daily   apixaban  2.5 mg Oral BID   carvedilol  3.125 mg Oral BID WC   clopidogrel  75 mg Oral Daily   ezetimibe  10 mg Oral Daily   famotidine  20 mg Oral QHS   Continuous Infusions:   LOS: 5 days    Time spent: 45 minutes spent on chart review, discussion with nursing staff, consultants, updating family and interview/physical exam; more than 50% of that time was spent in counseling and/or coordination of care.    Geradine Girt, DO [facility] Available via Epic secure chat 7am-7pm After these hours, please refer to coverage provider listed on [email or web address] 08/31/2022, 12:18 PM
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
## 2022-09-01 DIAGNOSIS — I2510 Atherosclerotic heart disease of native coronary artery without angina pectoris: Secondary | ICD-10-CM | POA: Diagnosis not present

## 2022-09-01 DIAGNOSIS — I5041 Acute combined systolic (congestive) and diastolic (congestive) heart failure: Secondary | ICD-10-CM | POA: Diagnosis not present

## 2022-09-01 DIAGNOSIS — J9601 Acute respiratory failure with hypoxia: Secondary | ICD-10-CM | POA: Diagnosis not present

## 2022-09-01 LAB — CBC
HCT: 40.4 % (ref 36.0–46.0)
Hemoglobin: 13.8 g/dL (ref 12.0–15.0)
MCH: 31.6 pg (ref 26.0–34.0)
MCHC: 34.2 g/dL (ref 30.0–36.0)
MCV: 92.4 fL (ref 80.0–100.0)
Platelets: 240 10*3/uL (ref 150–400)
RBC: 4.37 MIL/uL (ref 3.87–5.11)
RDW: 13.6 % (ref 11.5–15.5)
WBC: 9.2 10*3/uL (ref 4.0–10.5)
nRBC: 0 % (ref 0.0–0.2)

## 2022-09-01 LAB — BASIC METABOLIC PANEL
Anion gap: 9 (ref 5–15)
BUN: 28 mg/dL — ABNORMAL HIGH (ref 8–23)
CO2: 26 mmol/L (ref 22–32)
Calcium: 9.1 mg/dL (ref 8.9–10.3)
Chloride: 95 mmol/L — ABNORMAL LOW (ref 98–111)
Creatinine, Ser: 0.86 mg/dL (ref 0.44–1.00)
GFR, Estimated: >60 mL/min (ref >60)
Glucose, Bld: 110 mg/dL — ABNORMAL HIGH (ref 70–99)
Potassium: 3.9 mmol/L (ref 3.5–5.1)
Sodium: 130 mmol/L — ABNORMAL LOW (ref 135–145)

## 2022-09-01 MED ORDER — ONDANSETRON HCL 4 MG/2ML IJ SOLN
4.0000 mg | Freq: Four times a day (QID) | INTRAMUSCULAR | Status: DC | PRN
  Administered 2022-09-01: 4 mg via INTRAVENOUS
  Filled 2022-09-01: qty 2

## 2022-09-01 MED ORDER — FUROSEMIDE 20 MG PO TABS
20.0000 mg | ORAL_TABLET | Freq: Every day | ORAL | Status: DC
  Administered 2022-09-01 – 2022-09-02 (×2): 20 mg via ORAL
  Filled 2022-09-01 (×2): qty 1

## 2022-09-01 MED ORDER — SPIRONOLACTONE 25 MG PO TABS
25.0000 mg | ORAL_TABLET | Freq: Every day | ORAL | Status: DC
  Administered 2022-09-01 – 2022-09-02 (×2): 25 mg via ORAL
  Filled 2022-09-01 (×2): qty 1

## 2022-09-01 NOTE — Progress Notes (Deleted)
Date and time results received: 09/01/22 1020   Test: Hemoglobin  Critical Value: 6.5  Name of Provider Notified: Dr. Reola Mosher Awaiting orders
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
## 2022-09-01 NOTE — NC FL2 (Signed)
[place] LEVEL OF CARE SCREENING TOOL     IDENTIFICATION  Patient Name: [name] Birthdate: 05-25-1928 Sex: female Admission Date (Current Location): 08/25/2022  [facility] and [place] Number:  Herbalist and Address:  [facility] [place], [place] [address], [place], [place] [address]      Provider Number: [id number]  Attending Physician Name and Address:  Geradine Girt, DO  Relative Name and Phone Number:  Spriggs,[name] (Granddaughter)   [phone] (Mobile)    Current Level of Care: Hospital Recommended Level of Care: [place] Prior Approval Number:    Date Approved/Denied:   PASRR Number: [id number]  Discharge Plan: SNF    Current Diagnoses: Patient Active Problem List   Diagnosis Date Noted   Acute CHF (congestive heart failure) ([place]) 08/26/2022   Acute hypoxemic respiratory failure ([place]) 08/26/2022   Hypertensive urgency 08/26/2022   Acute on chronic diastolic (congestive) heart failure ([place]) 08/26/2022   Syncope 06/02/2022   Orthostatic hypotension 06/02/2022   CAD (coronary artery disease) 06/02/2022   Statin myopathy 05/30/2021   Chronic heart failure with preserved ejection fraction ([place]) 06/07/2020   Chest pain 06/06/2020   Progressive angina ([place]) 05/24/2020   Unstable angina ([place]) 05/24/2020   Anemia 06/09/2013   Asthma 06/09/2013   Dyspnea and respiratory abnormality 06/09/2013   Psoriasis 01/31/2013   Essential hypertension 07/08/2012   Vitamin D deficiency 12/22/2011   Concussion 11/25/2011   Multiple fractures of ribs of left side 11/25/2011   Grade 2 splenic laceration 11/25/2011   GERD (gastroesophageal reflux disease) 11/25/2011   Atherosclerotic heart disease of native coronary artery without angina pectoris 05/28/2011   Hyperlipidemia 12/05/2008   ASTHMA, UNSPECIFIED 12/05/2008   Diverticulosis of colon 12/05/2008   HEMOCCULT POSITIVE STOOL 12/05/2008   Arthropathy 12/18/2006    Osteopenia 12/18/2006    Orientation RESPIRATION BLADDER Height & Weight     Self  Normal Incontinent, External catheter Weight: 115 lb 15.4 oz (52.6 kg) Height:  4\' 10"  (147.3 cm)  BEHAVIORAL SYMPTOMS/MOOD NEUROLOGICAL BOWEL NUTRITION STATUS      Incontinent Diet (see d/c summary)  AMBULATORY STATUS COMMUNICATION OF NEEDS Skin   Extensive Assist Verbally Normal                       Personal Care Assistance Level of Assistance  Bathing, Feeding, Dressing Bathing Assistance: Maximum assistance Feeding assistance: Independent Dressing Assistance: Maximum assistance     Functional Limitations Info  Sight, Hearing, Speech Sight Info: Impaired Hearing Info: Impaired Speech Info: Adequate    SPECIAL CARE FACTORS FREQUENCY  OT (By licensed OT), PT (By licensed PT)     PT Frequency: 5x/week OT Frequency: 5x/week            Contractures Contractures Info: Not present    Additional Factors Info  Code Status, Allergies Code Status Info: DNR Allergies Info: Crestor (rosuvastatin Calcium), codeine, lisinopril, Entresto (sacubitril-valsartan), simvistatin, Pravastatin, Pantoprazole Sodium           Current Medications (09/01/2022):  This is the current hospital active medication list Current Facility-Administered Medications  Medication Dose Route Frequency Provider Last Rate Last Admin   acetaminophen (TYLENOL) tablet 650 mg  650 mg Oral Q6H PRN Shela Leff, MD       Or   acetaminophen (TYLENOL) suppository 650 mg  650 mg Rectal Q6H PRN Shela Leff, MD       amiodarone (PACERONE) tablet 200 mg  200 mg Oral Daily  Geralynn Rile, MD   200 mg at 09/01/22 [id number]   apixaban (ELIQUIS) tablet 2.5 mg  2.5 mg Oral BID Kris Mouton, RPH   2.5 mg at 09/01/22 [id number]   benzonatate (TESSALON) capsule 100 mg  100 mg Oral TID PRN Eulogio Bear U, DO       carvedilol (COREG) tablet 3.125 mg  3.125 mg Oral BID WC Geralynn Rile, MD   3.125 mg at 09/01/22  [id number]   clopidogrel (PLAVIX) tablet 75 mg  75 mg Oral Daily Shela Leff, MD   75 mg at 09/01/22 [id number]   ezetimibe (ZETIA) tablet 10 mg  10 mg Oral Daily Shela Leff, MD   10 mg at 09/01/22 [id number]   famotidine (PEPCID) tablet 20 mg  20 mg Oral QHS Vann, Jessica U, DO   20 mg at 08/31/22 2129   furosemide (LASIX) tablet 20 mg  20 mg Oral Daily Geralynn Rile, MD   20 mg at 09/01/22 [id number]   haloperidol lactate (HALDOL) injection 2 mg  2 mg Intravenous Q6H PRN Eulogio Bear U, DO       hydrALAZINE (APRESOLINE) injection 10 mg  10 mg Intravenous Q6H PRN Shela Leff, MD       ipratropium-albuterol (DUONEB) 0.5-2.5 (3) MG/3ML nebulizer solution 3 mL  3 mL Nebulization Q4H PRN Vann, Jessica U, DO       melatonin tablet 3 mg  3 mg Oral QHS PRN Opyd, Ilene Qua, MD   3 mg at 08/31/22 2129   metoprolol tartrate (LOPRESSOR) injection 5 mg  5 mg Intravenous Q2H PRN Opyd, Ilene Qua, MD   5 mg at 08/29/22 2102   ondansetron (ZOFRAN) injection 4 mg  4 mg Intravenous Q6H PRN Eulogio Bear U, DO   4 mg at 09/01/22 1411   spironolactone (ALDACTONE) tablet 25 mg  25 mg Oral Daily Geralynn Rile, MD   25 mg at 09/01/22 [id number]     Discharge Medications: Please see discharge summary for a list of discharge medications.  Relevant Imaging Results:  Relevant Lab Results:   Additional Information SSN [place], [place]

## 2022-09-01 NOTE — Plan of Care (Signed)
  Problem: Education: Goal: Ability to demonstrate management of disease process will improve Outcome: Progressing Goal: Ability to verbalize understanding of medication therapies will improve Outcome: Progressing   Problem: Activity: Goal: Capacity to carry out activities will improve Outcome: Progressing   Problem: Cardiac: Goal: Ability to achieve and maintain adequate cardiopulmonary perfusion will improve Outcome: Progressing   Problem: Education: Goal: Knowledge of General Education information will improve Description: Including pain rating scale, medication(s)/side effects and non-pharmacologic comfort measures Outcome: Progressing   Problem: Clinical Measurements: Goal: Ability to maintain clinical measurements within normal limits will improve Outcome: Progressing Goal: Will remain free from infection Outcome: Progressing Goal: Respiratory complications will improve Outcome: Progressing   Problem: Nutrition: Goal: Adequate nutrition will be maintained Outcome: Progressing   Problem: Elimination: Goal: Will not experience complications related to bowel motility Outcome: Progressing Goal: Will not experience complications related to urinary retention Outcome: Progressing   Problem: Pain Managment: Goal: General experience of comfort will improve Outcome: Progressing   Problem: Safety: Goal: Ability to remain free from injury will improve Outcome: Progressing   Problem: Pain Managment: Goal: General experience of comfort will improve Outcome: Progressing
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
## 2022-09-01 NOTE — TOC Initial Note (Addendum)
Transition of Care [facility]) - Initial/Assessment Note    Patient Details  Name: [name] MRN: [id number] Date of Birth: 07/05/1928  Transition of Care [facility]) CM/SW Contact:    Bethann Berkshire, [place] Phone Number: 09/01/2022, 1:53 PM  Clinical Narrative:                  CSW called pt's great grandaughter, [name], to discuss disposition. Pt lives at home with Bethann Berkshire and [name]'s husband. Bethann Berkshire does not work and reports she would be able to provide 24/7 care as she is a CNA herself. Bethann Berkshire is primarily concerned with how much PT pt would get at [facility] regarding decision for SNF vs HH. CSW explained differences of therapy services between the two. [name] indicates that family and pt would likely want for pt to go home but Bethann Berkshire is going to discuss further with family and call CSW back regarding decision.   1415: [name] called CSW back and confirmed they would like to move forward with STR at SNF prior to pt returning home. Fl2 completed and bed request sent in the hub.   Expected Discharge Plan: [place] Barriers to Discharge: Continued Medical Work up   Patient Goals and CMS Choice        Expected Discharge Plan and Services Expected Discharge Plan: [place]       Living arrangements for the past 2 months: Single Family Home                                      Prior Living Arrangements/Services Living arrangements for the past 2 months: Single Family Home Lives with:: Relatives Patient language and need for interpreter reviewed:: Yes        Need for Family Participation in Patient Care: Yes (Comment) Care giver support system in place?: Yes (comment)   Criminal Activity/Legal Involvement Pertinent to Current Situation/Hospitalization: No - Comment as needed  Activities of Daily Living Home Assistive Devices/Equipment: Cane (specify quad or straight), Shower chair without back, Shower chair with back, Environmental consultant (specify type) ADL  Screening (condition at time of admission) Patient's cognitive ability adequate to safely complete daily activities?: Yes Is the patient deaf or have difficulty hearing?: No Does the patient have difficulty seeing, even when wearing glasses/contacts?: No Does the patient have difficulty concentrating, remembering, or making decisions?: Yes Patient able to express need for assistance with ADLs?: Yes Does the patient have difficulty dressing or bathing?: Yes Independently performs ADLs?: No Does the patient have difficulty walking or climbing stairs?: Yes Weakness of Legs: Both Weakness of Arms/Hands: Both  Permission Sought/Granted                  Emotional Assessment       Orientation: : Oriented to Self Alcohol / Substance Use: Not Applicable Psych Involvement: No (comment)  Admission diagnosis:  LBBB (left bundle branch block) [I44.7] Dyspnea on exertion [R06.09] Acute CHF (congestive heart failure) ([place]) [I50.9] Pleural effusion, bilateral [J90] Hypertensive urgency [I16.0] Acute on chronic diastolic (congestive) heart failure ([place]) [I50.33] Patient Active Problem List   Diagnosis Date Noted   Acute CHF (congestive heart failure) ([place]) 08/26/2022   Acute hypoxemic respiratory failure ([place]) 08/26/2022   Hypertensive urgency 08/26/2022   Acute on chronic diastolic (congestive) heart failure ([place]) 08/26/2022   Syncope 06/02/2022   Orthostatic hypotension 06/02/2022   CAD (coronary artery disease)  06/02/2022   Statin myopathy 05/30/2021   Chronic heart failure with preserved ejection fraction (HCC) 06/07/2020   Chest pain 06/06/2020   Progressive angina (HCC) 05/24/2020   Unstable angina (HCC) 05/24/2020   Anemia 06/09/2013   Asthma 06/09/2013   Dyspnea and respiratory abnormality 06/09/2013   Psoriasis 01/31/2013   Essential hypertension 07/08/2012   Vitamin D deficiency 12/22/2011   Concussion 11/25/2011   Multiple fractures of ribs of left side 11/25/2011    Grade 2 splenic laceration 11/25/2011   GERD (gastroesophageal reflux disease) 11/25/2011   Atherosclerotic heart disease of native coronary artery without angina pectoris 05/28/2011   Hyperlipidemia 12/05/2008   ASTHMA, UNSPECIFIED 12/05/2008   Diverticulosis of colon 12/05/2008   HEMOCCULT POSITIVE STOOL 12/05/2008   Arthropathy 12/18/2006   Osteopenia 12/18/2006   PCP:  [name], NP Pharmacy:   [id number] - [place], [place] - [address] [place] [address] Phone: [phone] Fax: [phone]  [facility]) - [place], [place] - [address] [place] [address] [place] [address] Phone: [phone] Fax: [phone]  [id number] - [place], [place] - [address] [place] [address] Phone: [phone] Fax: [phone]  [facility] - [place], [place] - [address] [place] [address] Phone: [phone] Fax: [phone]  [facility] [address] [place] [address] Phone: [phone] Fax: [phone]     Social Determinants of Health (SDOH) Interventions Food Insecurity Interventions: Intervention Not Indicated Housing Interventions: Intervention Not Indicated Transportation Interventions: Intervention Not Indicated Utilities Interventions: Intervention Not Indicated Alcohol Usage Interventions: Intervention Not Indicated (Score <7) Financial Strain Interventions: Intervention Not Indicated  Readmission Risk Interventions     No data to display

## 2022-09-01 NOTE — Progress Notes (Signed)
Palliative Medicine Progress Note   Patient Name: [name]       Date: 09/01/2022 DOB: 1928-08-05  Age: 88 y.o. MRN#: [id number] Attending Physician: Geradine Girt, DO Primary Care Physician: [name], NP Admit Date: 08/25/2022    HPI/Patient Profile: 88 y.o. female  with past medical history of CAD status post PCI, HFpEF, asthma, GERD, hypertension, and hyperlipidemia. She presented to [facility] ED on 08/25/2022 with exertional dyspnea, chest tightness, near syncope, and bilateral lower extremity edema. Reportedly, her symptoms started shortly after her daughter died on 08/28/23. In the ED, she was hypertensive with SBP in the 170s to 180s. She was hypoxic in the 80's with ambulation. BNP 2352. CTA chest negative for PE; showed bilateral pleural effusions.  She was admitted to [facility] with acute on chronic diastolic CHF and acute hypoxemic respiratory failure.  Palliative Medicine was consulted for goals of care.  Subjective: Patient is resting in the chair. Per family at bedside (daughter [name] and great-granddaughter [name]), [name] did not sleep well last night and was restless. Family also reports she has been intermittently confused with some visual hallucinations.   Today we discussed advanced directives and the MOST form. [name] states she does not recall if she has previously done advanced directives.  I provided education that an HCPOA is a legal document in which you name another person, called a "healthcare agent" to make healthcare decisions for you when you are not able to make those decisions for yourself.  [name] pointed to [place] and stated "she would make my decisions".   [name] then stated "well I was told I would be making the decisions". She reports that [name] has  previously completed documents with an attorney.  She does not know if these documents pertain to healthcare matters versus financial matters. She expressed concern that creating "new" documents would affect previous decision regarding financial matters. I provided education that advanced directives are completely separate from financial documents.   This transitioned into a discussion about who would be the primary healthcare agent and who would be the alternate. [name] believes she should be the primary health care agent and [name] should be the alternate, and they could make decisions together. [name] expresses concern that if she and Cynthia Muscat do not agree, then her opinion would ultimately be  over-ruled by [name].   I later spoke with [name] by phone. She was able to locate the documents at [name]'s home and has sent a picture of them to my phone. I confirmed that the documents are a durable POA and a last will and testament.   [name] feels quite strongly that she should be [name]'s primary health care agent. She has had multiple discussions with [name] and feels confident she knows what she would want. She expresses concern that [name] is interested in secondary financial gain, and that this would affect her ability to make appropriate medical decisions for [name].  Discussed that it was ultimately up to [name] to designate a healthcare agent.  I encouraged [name] to continue this discussion with [name] and [name].  I emailed [name] a blank copy of the advance directives packet, and explained that they could complete this after discharge.    Objective:  Physical Exam Vitals reviewed.  Constitutional:      General: She is not in acute distress.    Comments: Chronically ill-appearing  Pulmonary:     Effort: Pulmonary effort is normal.  Neurological:     Mental Status: She is alert.     Comments: Intermittent confusion             Vital Signs: BP 110/85 (BP Location: Left Arm)   Pulse 73    Temp 98.5 F (36.9 C) (Oral)   Resp (!) 22   Ht 4\' 10"  (1.473 m)   Wt 52.6 kg   SpO2 94%   BMI 24.24 kg/m  SpO2: SpO2: 94 % O2 Device: O2 Device: Room Air O2 Flow Rate:      LBM: Last BM Date : 08/30/22     Palliative Medicine Assessment & Plan   Assessment: Principal Problem:   Acute CHF (congestive heart failure) (HCC) Active Problems:   Hyperlipidemia   Asthma   Chest pain   CAD (coronary artery disease)   Acute hypoxemic respiratory failure (HCC)   Hypertensive urgency   Acute on chronic diastolic (congestive) heart failure (HCC)    Recommendations/Plan: DNR/DNI as previously documented Continue current supportive interventions Goal of care is to return home - patient will likely discharge tomorrow I informed that patient has not previously completed advanced directives - encouraged family to follow-up on this after discharge  Goals of Care and Additional Recommendations: Limitations on Scope of Treatment: Full Scope Treatment  Prognosis:  Unable to determine  Discharge Planning: To Be Determined   Thank you for allowing the Palliative Medicine Team to assist in the care of this patient.   Greater than 50%  of this time was spent counseling and coordinating care related to the above assessment and plan.  Total time: 30 minutes   Cynthia Bullion, NP Palliative Medicine   Please contact Palliative Medicine Team phone at [phone] for questions and concerns.  For individual provider, see AMION.

## 2022-09-01 NOTE — Progress Notes (Signed)
Mobility Specialist Progress Note    09/01/22 1219  Mobility  Activity Ambulated with assistance in room  Level of Assistance Moderate assist, patient does 50-74%  Assistive Device Front wheel walker  Distance Ambulated (ft) 4 ft  Activity Response Tolerated fair  Mobility Referral Yes  $Mobility charge 1 Mobility   Pt received in chair. Pt stated she wanted to wait until she was done sewing and asked where her sewing machine went. RN notified. Left in bed with call bell in reach, alarm on, and family present.   Hildred Alamin Mobility Specialist
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
## 2022-09-01 NOTE — Progress Notes (Signed)
Mobility Specialist Progress Note    09/01/22 1054  Mobility  Activity Ambulated with assistance in room  Level of Assistance Moderate assist, patient does 50-74%  Assistive Device Front wheel walker  Distance Ambulated (ft) 4 ft  Activity Response Tolerated well  Mobility Referral Yes  $Mobility charge 1 Mobility   Post-Mobility: 72 HR, 96% SpO2  Pt received in bed and agreeable to get to chair. Needed verbal cueing for safety and hand placement. Pt had a posterior lean. Left with call bell in reach and family present.   Hildred Alamin Mobility Specialist

## 2022-09-01 NOTE — Progress Notes (Signed)
Heart Failure Stewardship Pharmacist Progress Note   PCP: Nche, Charlene Brooke, NP PCP-Cardiologist: [name], MD    HPI:  88 yo F with PMH of CAD s/p PCI, CHF, asthma, GERD, HTN, and HLD.  Recently admitted from 7/11-7/15 with syncopal episode likely due to orthostatic hypotension. ECHO at that time with LVEF 55-60%, mild LVH, RV normal. Discharged on midodrine and HCTZ stopped.   Presented to the ED on 10/3 with shortness of breath, HTN, hypoxia, LE edema, and near syncope. CXR with cardiomegaly, chronic appearing increased interstitial lung markings, and small bilateral pleural effusions. CTA negative for PE. ECHO 10/4 with newly reduced LVEF 30-35%, mild LVH with G3DD, RV mildly reduced.  Current HF Medications: Diuretic: furosemide 20 mg daily Beta Blocker: carvedilol 3.125 mg BID MRA: spironolactone 25 mg daily  Prior to admission HF Medications: ACE/ARB/ARNI: losartan 12.5 mg daily *also taking midodrine 10 mg TID for orthostatic hypotension  Pertinent Lab Values: Serum creatinine 0.86, BUN 28, Potassium 3.9, Sodium 130, BNP 2352.3  Vital Signs: Weight: 115 lbs - bed weight (admission weight: 121 lbs) Blood pressure: 140/60s Heart rate: 70s   Medication Assistance / Insurance Benefits Check: Does the patient have prescription insurance?  Yes Type of insurance plan: UHC Medicare  Does the patient qualify for medication assistance through manufacturers or grants?   Yes Eligible grants and/or patient assistance programs: Lisabeth Register Medication assistance applications in progress: Lindi Adie  Medication assistance applications approved: none Approved medication assistance renewals will be completed by: pending  Outpatient Pharmacy:  Prior to admission [facility] Is the patient willing to use [facility] at discharge? Yes Is the patient willing to transition their outpatient pharmacy to utilize a [facility] outpatient pharmacy?    Pending    Assessment: 1. Acute on chronic systolic and diastolic CHF (LVEF 53-97%). NYHA class III symptoms. - Agree with adding furosemide 20 mg daily - Continue carvedilol 3.125 mg BID - Agree with restarting spironolactone 25 mg daily - Experienced cough with Entresto (previously had the same with lisinopril). - Consider restarting Farxiga 10 mg daily - Suspected that midodrine will not need to be continued, orthostatic symptoms may be related to poor oral intake/malnutrition  Plan: 1) Medication changes recommended at this time: - Agree with changes; restart Farxiga 10 mg daily tomorrow if SCr stable  2) Patient assistance: - Currently in coverage gap (donut hole) - Initial Farxiga copay $99 - Initial Jardiance copay $110 - Patient assistance initiated for Entresto and Farxiga - off both meds at this time  3)  Education  - Patient and family have been educated on current HF medications and potential additions to HF medication regimen - Patient verbalizes understanding that over the next few months, these medication doses may change and more medications may be added to optimize HF regimen - Patient and family have been educated on basic disease state pathophysiology and goals of therapy   Kerby Nora, PharmD, BCPS Heart Failure Cytogeneticist Phone [phone]

## 2022-09-02 ENCOUNTER — Other Ambulatory Visit (HOSPITAL_COMMUNITY): Payer: Self-pay

## 2022-09-02 MED ORDER — FUROSEMIDE 20 MG PO TABS
20.0000 mg | ORAL_TABLET | Freq: Every day | ORAL | 3 refills | Status: DC
  Filled 2022-09-02: qty 30, 30d supply, fill #0

## 2022-09-02 MED ORDER — ACETAMINOPHEN 325 MG PO TABS: 650.0000 mg | ORAL_TABLET | Freq: Four times a day (QID) | ORAL | Status: DC | PRN

## 2022-09-02 MED ORDER — AMIODARONE HCL 200 MG PO TABS
200.0000 mg | ORAL_TABLET | Freq: Every day | ORAL | 3 refills | Status: DC
  Filled 2022-09-02: qty 30, 30d supply, fill #0

## 2022-09-02 MED ORDER — CARVEDILOL 3.125 MG PO TABS
3.1250 mg | ORAL_TABLET | Freq: Two times a day (BID) | ORAL | 3 refills | Status: DC
  Filled 2022-09-02 – 2022-09-24 (×2): qty 60, 30d supply, fill #0

## 2022-09-02 NOTE — Discharge Summary (Signed)
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 93.80 cm/s  Systemic VTI:  0.16 m MV A velocity: 37.70 cm/s  Systemic Diam: 2.10 cm MV E/A ratio:  2.49 Dalton McleanMD Electronically signed by Wilfred Lacy Signature Date/Time: 08/26/2022/12:04:51 PM    Final    CT Angio Chest PE W and/or Wo Contrast  Result Date: 08/26/2022 CLINICAL DATA:  Shortness of breath and chest tightness for several days EXAM: CT ANGIOGRAPHY CHEST WITH CONTRAST TECHNIQUE: Multidetector CT imaging of the chest was performed using the standard protocol during bolus administration of intravenous contrast. Multiplanar CT image reconstructions and MIPs were obtained to evaluate the vascular anatomy. RADIATION DOSE REDUCTION: This exam was performed according to the departmental dose-optimization program which includes automated exposure control, adjustment of the mA and/or kV according to patient size and/or use of iterative reconstruction technique. CONTRAST:  23mL OMNIPAQUE IOHEXOL 350 MG/ML SOLN COMPARISON:  06/03/2022 FINDINGS: Cardiovascular: Atherosclerotic calcifications of the thoracic aorta are noted. No  aneurysmal dilatation is seen. The pulmonary artery shows a normal branching pattern bilaterally. No focal filling defect to suggest pulmonary embolism is seen. Coronary calcifications are noted. Heart is at the upper limits of normal in size. Mediastinum/Nodes: Thoracic inlet is within normal limits. No hilar or mediastinal adenopathy is noted. The esophagus is within normal limits. Lungs/Pleura: Lungs are well aerated bilaterally with diffuse emphysematous changes. Bilateral pleural effusions are noted right greater than left with associated atelectatic change. No sizable parenchymal nodule is seen. No pneumothorax is noted. Chronic subpleural fibrotic changes are noted as well. Upper Abdomen: No acute abnormality. Musculoskeletal: Degenerative changes of the thoracic spine are noted. Old rib fractures are seen with healing. No acute rib abnormality is noted. Review of the MIP images confirms the above findings. IMPRESSION: Bilateral pleural effusions right greater than left. No evidence of pulmonary emboli. Scattered atelectatic changes and chronic fibrotic changes. Aortic Atherosclerosis (ICD10-I70.0) and Emphysema (ICD10-J43.9). Electronically Signed   By: Alcide Clever M.D.   On: 08/26/2022 02:07   DG Chest 2 View  Result Date: 08/25/2022 CLINICAL DATA:  Shortness of breath. EXAM: CHEST - 2 VIEW COMPARISON:  July 07, 2022 FINDINGS: The cardiac silhouette is mildly enlarged. There is marked severity calcification of the thoracic aorta. Mild, diffuse, chronic appearing increased interstitial lung markings are seen. Mild right upper lobe scarring and/or atelectasis is noted. There are small bilateral pleural effusions. No pneumothorax is identified. The visualized skeletal structures are unremarkable. IMPRESSION: 1. Cardiomegaly and chronic appearing increased interstitial lung markings with mild right upper lobe scarring and/or atelectasis. 2. Small bilateral pleural effusions. Electronically Signed   By:  Aram Candela M.D.   On: 08/25/2022 20:34    Microbiology: Recent Results (from the past 240 hour(s))  SARS  Coronavirus 2 by RT PCR (hospital order, performed in [facility] hospital lab) *cepheid single result test* Anterior Nasal Swab     Status: None   Collection Time: 08/30/22  1:25 PM   Specimen: Anterior Nasal Swab  Result Value Ref Range Status   SARS Coronavirus 2 by RT PCR NEGATIVE NEGATIVE Final    Comment: (NOTE) SARS-CoV-2 target nucleic acids are NOT DETECTED.  The SARS-CoV-2 RNA is generally detectable in upper and lower respiratory specimens during the acute phase of infection. The lowest concentration of SARS-CoV-2 viral copies this assay can detect is 250 copies / mL. A negative result does not preclude SARS-CoV-2 infection and should not be used as the sole basis for treatment or other patient management decisions.  A negative result may occur with improper specimen collection / handling, submission of specimen other than nasopharyngeal swab, presence of viral mutation(s) within the areas targeted by this assay, and inadequate number of viral copies (<250 copies / mL). A negative result must be combined with clinical observations, patient history, and epidemiological information.  Fact Sheet for Patients:   https://www.patel.info/  Fact Sheet for Healthcare Providers: https://hall.com/  This test is not yet approved or  cleared by the [place] FDA and has been authorized for detection and/or diagnosis of SARS-CoV-2 by FDA under an Emergency Use Authorization (EUA).  This EUA will remain in effect (meaning this test can be used) for the duration of the COVID-19 declaration under Section 564(b)(1) of the Act, 21 U.S.C. section 360bbb-3(b)(1), unless the authorization is terminated or revoked sooner.  Performed at [facility], [place] [address], [place], [place] [address]   Respiratory (~20 pathogens) panel  by PCR     Status: None   Collection Time: 08/30/22  2:05 PM   Specimen: Nasopharyngeal Swab; Respiratory  Result Value Ref Range Status   Adenovirus NOT DETECTED NOT DETECTED Final   Coronavirus 229E NOT DETECTED NOT DETECTED Final    Comment: (NOTE) The Coronavirus on the Respiratory Panel, DOES NOT test for the novel  Coronavirus (2019 nCoV)    Coronavirus HKU1 NOT DETECTED NOT DETECTED Final   Coronavirus NL63 NOT DETECTED NOT DETECTED Final   Coronavirus OC43 NOT DETECTED NOT DETECTED Final   Metapneumovirus NOT DETECTED NOT DETECTED Final   Rhinovirus / Enterovirus NOT DETECTED NOT DETECTED Final   Influenza A NOT DETECTED NOT DETECTED Final   Influenza B NOT DETECTED NOT DETECTED Final   Parainfluenza Virus 1 NOT DETECTED NOT DETECTED Final   Parainfluenza Virus 2 NOT DETECTED NOT DETECTED Final   Parainfluenza Virus 3 NOT DETECTED NOT DETECTED Final   Parainfluenza Virus 4 NOT DETECTED NOT DETECTED Final   Respiratory Syncytial Virus NOT DETECTED NOT DETECTED Final   Bordetella pertussis NOT DETECTED NOT DETECTED Final   Bordetella Parapertussis NOT DETECTED NOT DETECTED Final   Chlamydophila pneumoniae NOT DETECTED NOT DETECTED Final   Mycoplasma pneumoniae NOT DETECTED NOT DETECTED Final    Comment: Performed at [facility], [place], [place], [place] [address]     Labs: Basic Metabolic Panel: Recent Labs  Lab 08/27/22 1145 08/28/22 0012 08/29/22 0902 08/30/22 0040 08/31/22 0036 09/01/22 0252  NA 131* 129* 129* 130* 132* 130*  K 3.2* 4.7 4.4 4.6 4.1 3.9  CL 92* 96* 95* 97* 96* 95*  CO2 27 25 25 24 25 26   GLUCOSE 151* 120* 130* 158* 84 110*  BUN 18 19 18  25* 30* 28*  CREATININE 0.92 0.75 0.89 1.10*  0.92 0.86  CALCIUM 9.1 9.0 9.3 9.3 9.5 9.1  MG 1.8  --   --   --   --   --    Liver Function Tests: No results for input(s): "AST", "ALT", "ALKPHOS", "BILITOT", "PROT", "ALBUMIN" in the last 168 hours. No results for input(s): "LIPASE", "AMYLASE" in  the last 168 hours. No results for input(s): "AMMONIA" in the last 168 hours. CBC: Recent Labs  Lab 08/28/22 0012 08/29/22 0902 08/30/22 0040 08/31/22 0036 09/01/22 0252  WBC 6.9 9.1 11.1* 11.8* 9.2  HGB 13.1 14.2 13.5 13.2 13.8  HCT 38.4 41.7 40.6 39.3 40.4  MCV 91.2 93.3 95.3 94.0 92.4  PLT 219 260 281 259 240   Cardiac Enzymes: Recent Labs  Lab 08/28/22 0012  CKTOTAL 246*   BNP: BNP (last 3 results) Recent Labs    08/25/22 1918  BNP 2,352.3*    ProBNP (last 3 results) No results for input(s): "PROBNP" in the last 8760 hours.  CBG: No results for input(s): "GLUCAP" in the last 168 hours.     Signed:  Nita Sells MD   [facility] 09/02/2022, 10:51 AM

## 2022-09-02 NOTE — Progress Notes (Signed)
Heart Failure Stewardship Pharmacist Progress Note   PCP: Nche, Charlene Brooke, NP PCP-Cardiologist: [name], MD    HPI:  88 yo F with PMH of CAD s/p PCI, CHF, asthma, GERD, HTN, and HLD.  Recently admitted from 7/11-7/15 with syncopal episode likely due to orthostatic hypotension. ECHO at that time with LVEF 55-60%, mild LVH, RV normal. Discharged on midodrine and HCTZ stopped.   Presented to the ED on 10/3 with shortness of breath, HTN, hypoxia, LE edema, and near syncope. CXR with cardiomegaly, chronic appearing increased interstitial lung markings, and small bilateral pleural effusions. CTA negative for PE. ECHO 10/4 with newly reduced LVEF 30-35%, mild LVH with G3DD, RV mildly reduced.  Current HF Medications: Diuretic: furosemide 20 mg daily Beta Blocker: carvedilol 3.125 mg BID MRA: spironolactone 25 mg daily  Prior to admission HF Medications: ACE/ARB/ARNI: losartan 12.5 mg daily *also taking midodrine 10 mg TID for orthostatic hypotension  Pertinent Lab Values: As of 10/10: Serum creatinine 0.86, BUN 28, Potassium 3.9, Sodium 130, BNP 2352.3  Vital Signs: Weight: 116 lbs - bed weight (admission weight: 121 lbs) Blood pressure: 140/60s Heart rate: 60-70s   Medication Assistance / Insurance Benefits Check: Does the patient have prescription insurance?  Yes Type of insurance plan: UHC Medicare  Does the patient qualify for medication assistance through manufacturers or grants?   Yes Eligible grants and/or patient assistance programs: Lisabeth Register Medication assistance applications in progress: Lindi Adie  Medication assistance applications approved: none Approved medication assistance renewals will be completed by: pending  Outpatient Pharmacy:  Prior to admission outpatient pharmacy: [facility] Is the patient willing to use [facility] at discharge? Yes Is the patient willing to transition their outpatient pharmacy to utilize a [facility] outpatient  pharmacy?   Pending    Assessment: 1. Acute on chronic systolic and diastolic CHF (LVEF 61-95%). NYHA class II symptoms. - Continue furosemide 20 mg daily - Continue carvedilol 3.125 mg BID - Continue spironolactone 25 mg daily - Experienced cough with Entresto (previously had the same with lisinopril). - Consider restarting Farxiga 10 mg daily - Suspected that midodrine will not need to be continued, orthostatic symptoms may be related to poor oral intake/malnutrition  Plan: 1) Medication changes recommended at this time: - Restart Farxiga 10 mg daily  2) Patient assistance: - Currently in coverage gap (donut hole) - Initial Farxiga copay $99 - Initial Jardiance copay $110 - Patient assistance initiated for Entresto and Farxiga - off both meds at this time - Plan for SNF at discharge  3)  Education  - Patient and family have been educated on current HF medications and potential additions to HF medication regimen - Patient verbalizes understanding that over the next few months, these medication doses may change and more medications may be added to optimize HF regimen - Patient and family have been educated on basic disease state pathophysiology and goals of therapy   Kerby Nora, PharmD, BCPS Heart Failure Cytogeneticist Phone [phone]

## 2022-09-02 NOTE — Progress Notes (Signed)
RN went over d/c summary w/ pt and pt's daughter. Belongings w/ daughter, including TOC meds. Pt waiting on family member who is bringing clothes.  NT transporting pt to private vehicle where pt's family member will transport pt home

## 2022-09-02 NOTE — Progress Notes (Signed)
Occupational Therapy Treatment Patient Details Name: [name] MRN: [id number] DOB: [birth year] Today's Date: 09/02/2022   History of present illness Pt is a 88 y.o. female admitted 08/25/22 with SOB, chest tightness, BLE swelling. Workup for CHF exacerbation, hypertensive urgency. Chest CTA showing bilateral pleural effusions. PMH includes CAD s/p PCI, HTN, asthma.   OT comments  [name] was seen prior to d/c home today. She continues to present with impaired cognition, but generally followed all simple commands with increased cues and time. Pt also continues to present with a L lateral lean in sitting and a posterior bias in standing. Despite maximal cues and position changes pt needed max A for static and dynamic standing balance. This therapist offered pt's daughter to assist with transfer in order to prepare for assisting at home, pt's daughter declined saying she is not allowed to "lift heavy." Pt remains a high fall risk with min-max A needed for all ADLs and mobility. Continue to recommend d/c to SNF> However family has decided to bring pt home, she will need max HH services.    Recommendations for follow up therapy are one component of a multi-disciplinary discharge planning process, led by the attending physician.  Recommendations may be updated based on patient status, additional functional criteria and insurance authorization.    Follow Up Recommendations  Skilled nursing-short term rehab (<3 hours/day)    Assistance Recommended at Discharge Frequent or constant Supervision/Assistance  Patient can return home with the following  A lot of help with walking and/or transfers;A lot of help with bathing/dressing/bathroom;Assistance with cooking/housework;Direct supervision/assist for medications management;Direct supervision/assist for financial management;Assist for transportation;Help with stairs or ramp for entrance   Equipment Recommendations  BSC/3in1       Precautions /  Restrictions Precautions Precautions: Fall Restrictions Weight Bearing Restrictions: No       Mobility Bed Mobility Overal bed mobility: Needs Assistance Bed Mobility: Supine to Sit     Supine to sit: Mod assist          Transfers Overall transfer level: Needs assistance Equipment used: Rolling walker (2 wheels) Transfers: Sit to/from Stand, Bed to chair/wheelchair/BSC Sit to Stand: Mod assist     Step pivot transfers: Max assist     General transfer comment: significant posterior bias     Balance Overall balance assessment: Needs assistance Sitting-balance support: No upper extremity supported, Feet supported Sitting balance-Leahy Scale: Fair     Standing balance support: Bilateral upper extremity supported, During functional activity Standing balance-Leahy Scale: Poor Standing balance comment: poor-zero                           ADL either performed or assessed with clinical judgement   ADL Overall ADL's : Needs assistance/impaired                         Toilet Transfer: Maximal assistance;Stand-pivot;BSC/3in1;Rolling walker (2 wheels) Toilet Transfer Details (indicate cue type and reason): significant posterior bias, unable to correct         Functional mobility during ADLs: Maximal assistance;Rolling walker (2 wheels) General ADL Comments: L lateral lean in sitting, posterior bias in standing. Unable to correct either despite max cues and assist    Extremity/Trunk Assessment Upper Extremity Assessment Upper Extremity Assessment: Generalized weakness   Lower Extremity Assessment Lower Extremity Assessment: Defer to PT evaluation        Vision   Vision Assessment?: No apparent visual deficits Additional Comments:  seemingly [facility]   Perception Perception Perception: Not tested   Praxis Praxis Praxis: Not tested    Cognition Arousal/Alertness: Awake/alert Behavior During Therapy: Flat affect Overall Cognitive Status:  Impaired/Different from baseline Area of Impairment: Orientation, Attention, Memory, Following commands, Safety/judgement, Awareness, Problem solving                 Orientation Level: Disoriented to, Time Current Attention Level: Sustained Memory: Decreased recall of precautions, Decreased short-term memory Following Commands: Follows one step commands with increased time, Follows one step commands inconsistently Safety/Judgement: Decreased awareness of safety, Decreased awareness of deficits Awareness: Intellectual Problem Solving: Slow processing, Difficulty sequencing, Decreased initiation, Requires verbal cues General Comments: follows commands with increased time and cues. generally confused throughout. little to no insight to deficits and safety.        Exercises      Shoulder Instructions       General Comments VSS on RA. Daughter present, offered to teach daughter how to safely assist with a transfer. She declined stating "I'm not allowed to lift heavy."    Pertinent Vitals/ Pain       Pain Assessment Pain Assessment: No/denies pain Pain Intervention(s): Monitored during session   Frequency  Min 2X/week        Progress Toward Goals  OT Goals(current goals can now be found in the care plan section)  Progress towards OT goals: Progressing toward goals  Acute Rehab OT Goals OT Goal Formulation: With patient Time For Goal Achievement: 09/10/22 Potential to Achieve Goals: Good ADL Goals Pt Will Perform Grooming: with min assist;standing Pt Will Perform Upper Body Dressing: sitting;with supervision Pt Will Perform Lower Body Dressing: with min assist;sit to/from stand Pt Will Transfer to Toilet: with min assist;ambulating;regular height toilet;bedside commode Pt Will Perform Toileting - Clothing Manipulation and hygiene: with min assist;sit to/from stand Additional ADL Goal #1: Pt will perform bed mobility with supervision as a precursor to ADLs.  Plan          AM-PAC OT "6 Clicks" Daily Activity     Outcome Measure   Help from another person eating meals?: A Lot Help from another person taking care of personal grooming?: A Lot Help from another person toileting, which includes using toliet, bedpan, or urinal?: A Lot Help from another person bathing (including washing, rinsing, drying)?: A Lot Help from another person to put on and taking off regular upper body clothing?: A Lot Help from another person to put on and taking off regular lower body clothing?: A Lot 6 Click Score: 12    End of Session Equipment Utilized During Treatment: Rolling walker (2 wheels);Gait belt  OT Visit Diagnosis: Unsteadiness on feet (R26.81);Other abnormalities of gait and mobility (R26.89);Muscle weakness (generalized) (M62.81);Other symptoms and signs involving cognitive function   Activity Tolerance Patient tolerated treatment well   Patient Left in chair;with call bell/phone within reach;with family/visitor present   Nurse Communication Mobility status        Time: 1610-9604 OT Time Calculation (min): 39 min  Charges: OT General Charges $OT Visit: 1 Visit OT Treatments $Therapeutic Activity: 23-37 mins   Donia Pounds 09/02/2022, 1:11 PM

## 2022-09-02 NOTE — TOC Transition Note (Addendum)
Transition of Care [facility]) - CM/SW Discharge Note   Patient Details  Name: [name] MRN: [id number] Date of Birth: 1928/06/12  [facility] CM/SW Contact:  Zenon Mayo, RN Phone Number: 09/02/2022, 11:53 AM   Clinical Narrative:    NCM notified that patient wants to go home with HHPT,  NCM offered choice  with Medicare.gov list to daughter who was at the bedside, she chose [place], [place] made referral to [facility] with Alvis Lemmings, he is able to take referral. Soc will begin 24 to 48 hrs post dc.  [name] grand daughter,  lives with patient.  The daughter , [name] at the bedside will transport patient home today.    Final next level of care: [place] Barriers to Discharge: No Barriers Identified   Patient Goals and CMS Choice Patient states their goals for this hospitalization and ongoing recovery are:: return home with [facility] CMS Medicare.gov Compare Post Acute Care list provided to:: Patient Represenative (must comment) Choice offered to / list presented to : Adult Children  Discharge Placement                       Discharge Plan and Services In-house Referral: NA Discharge Planning Services: CM Consult Post Acute Care Choice: Home Health            DME Agency: NA       HH Arranged: PT HH Agency: [place] Date [facility] Agency Contacted: 09/02/22 Time HH Agency Contacted: 1152 Representative spoke with at [place]: Tommi Rumps  Social Determinants of Health (SDOH) Interventions Food Insecurity Interventions: Intervention Not Indicated Housing Interventions: Intervention Not Indicated Transportation Interventions: Intervention Not Indicated Utilities Interventions: Intervention Not Indicated Alcohol Usage Interventions: Intervention Not Indicated (Score <7) Financial Strain Interventions: Intervention Not Indicated   Readmission Risk Interventions     No data to display

## 2022-09-02 NOTE — Progress Notes (Signed)
Chaplain responded to PMT consult for spiritual care. Chaplain was made aware that PMT discerned focus should be on AD rather than grief support as pt had voiced her wishes to not further discuss her daughter's recent [name].  At the time of visit, pt had just completed consultation regarding her upcoming discharge. Chaplain introduced spiritual care. Pt was lying in bed with her daughter, Cynthia [name] at her bedside. Chaplain inquired how pt was feeling about upcoming discharge and pt stated that she was okay with her, her daughter quickly interjected that she had been asking to go home for some time and imagined she might be more excited than just "okay with it." Chaplain inquired about the request for an Advance Directive and offered brief explanation. Chaplain's daughter quickly stated that she really requested a chaplain visit because she feels her mother is not grieving her sister's [name] appropriately and she feels that her mother needs to talk about it.  Chaplain acknowledged pt's agency and gave Cynthia [name] an opportunity to say more about Cynthia [name]'s concerns and Cynthia [name] if she would like. Pt acknowledged that she was surprised to learn about Cynthia [name], but also identified several places where she could relate to Cynthia experience. She reports she also doesn't see much point in telling everyone about her own illnesses and that her own brother had chosen to discontinue use of oxygen, so she supposes that's what her people do. Cynthia [name] went on to state that she understands that everyone will die and God is the one who decides when that will be, so she doesn't see much point in dwelling on that fact. Cynthia [name] tearfully acknowledged that she is afraid her mother has given up. Her mother's health declined significantly (per Cynthia [name]'s report) 2 days after her sister's [name], which she sees as a correlation.   Chaplain provided education on various grief responses and facilitated discussion between Cynthia [name] and  her daughter Cynthia [name] to aid in mutual understanding. Chaplain encouraged Cynthia [name] to allow space for her mother's agency of her grief and her health care and her personal time and acknowledged how painful it is to see the people we love make choices that may not be the ones we see as the very best for them. Chaplain facilitated discussion on quality of life vs quantity of life and Cynthia [name] reassured her daughter that she has not made a choice to give up, but she has accepted that life is limited.  Cynthia [name] was very tearful throughout the visit, frequently directing the conversation to her mother, but also voicing her own grief and frustration that she has chosen to fight instead of give up over the years as she has faced various health trials. She laments her sister's choice to keep her illness private in such a way that did now allow her to act accordingly. Chaplain engaged in reflective listening as Cynthia [name] explored feelings of guilt about not bringing her mother to visit Cynthia [name] more and chaplain assisted Cynthia [name] with reframing as she considered that her sister may have not wished to live her final years with everyone behaving as though she was dying, acknowledging the duality of the knowledge that her sister may have gotten the [name] she wanted while also leaving her family in more pain.  Chaplain normalized the variety of these grief responses and encouraged Cynthia [name] to practice acceptance of the differences she and her mother are experiencing while also practicing caring for her own grief well. Chaplain provided education on  grief resources, particularly free grief counseling through Hospice.  Chaplain completed the visit with a practice of life review as Cynthia [name] shared about the joy she finds in keeping busy. 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 Cynthia [name] also finds joy in the antics of her 26 year old great grandson who lives in the home with her and her grand daughter.    Please page as further needs arise.  Donald Prose. Elyn Peers, M.Div. [facility] Chaplain Pager [phone] Office [phone]

## 2022-09-03 ENCOUNTER — Telehealth: Payer: Self-pay | Admitting: Nurse Practitioner

## 2022-09-03 ENCOUNTER — Telehealth: Payer: Self-pay | Admitting: Cardiovascular Disease

## 2022-09-03 ENCOUNTER — Other Ambulatory Visit (HOSPITAL_COMMUNITY): Payer: Self-pay

## 2022-09-03 DIAGNOSIS — R110 Nausea: Secondary | ICD-10-CM

## 2022-09-03 NOTE — Telephone Encounter (Signed)
Daughter called stating patient was recently released from hospital and they changed her medications but did not include pain medication.  Daughter would like to know if she can give the patient Aleve or which medication the patient can have.

## 2022-09-03 NOTE — Telephone Encounter (Signed)
Pt granddaughter wants to know if pt can be prescribed some nausea medication

## 2022-09-03 NOTE — Telephone Encounter (Signed)
Pt's granddaughter/caregiver, [name], called requesting meds for anti-nausea. Pt was in the hospital and [name] notes that several medications were changed. She believes that may be causing stomach issues.   [name]: [phone]

## 2022-09-03 NOTE — Progress Notes (Signed)
[facility] Transitions of Care Clinic Heart Failure Pharmacist Encounter  PCP: Nche, Bonna Gains, NP PCP-Cardiologist: [name], MD  HPI:  [name] 81 YOF with PMH significant for HLD, asthma, GERD, CHF, HTN, and CAD s/p PCI. She presents to the ED on 10/4 with a 2-3 day history of worsening SOB and near syncope, almost exclusively related to exertion. She also presented with HTN and LE edema. During her hospitalization, she experienced dizziness leading to spironolactone, Marcelline Deist, and Entresto being held. Additionally, Sherryll Burger was discontinued d/t cough, which she also experienced with Lisinopril previously.   She was previously admitted 7/11-7/15 syncope likely d/t orthostatic hypotension. She was discharged on midodrine at that time for orthostasis, though she was asymptomatic. ECHO at this time showed LVEF of 55-60% and grade I diastolic dysfunction.   ECHO on 08/26/22 showed 30-35% with grade III diastolic dysfunction.   Today, [name] presents to the Heart Failure TOC Clinic for follow up. She denies taking daily weights at home. No edema or SOB reported today, however, she does elevate her feet and utilize compression socks. She endorses checking her BP at home, however, she only gave a single reading of 111/52 mmHg. The granddaughter stated she typically ran high prior to her admission, however, she has been experiencing soft BP readings and dizziness post-discharge. She endorses medication adherence exactly to the discharge medication list. There were some discrepancies with her discharge medication list and the recommendations from cardiology.  Additionally, she reported not to be eating much lately as she vomits whenever she eats. She reports that she regularly drinks mountain dew and she will continue to do so.   HF Medications: Carvedilol 3.125mg  BID, furosemide 20mg  daily, spironolactone 25mg  daily, Farxiga 10mg  once daily, Entresto 24/26mg   BID  Has the patient been experiencing any side effects to the medications prescribed?  Yes - dizziness and lightheadedness   Does the patient have any problems obtaining medications due to transportation or finances?   no  Understanding of regimen: poor Understanding of indications: poor Potential of compliance: excellent Patient understands to avoid NSAIDs. Patient understands to avoid decongestants. Her great-grandaughter manages her health and medications.I was able to counsel the great-granddaughter and she demonstrated understanding of the regimen and indications.   Pertinent Lab Values: Serum creatinine 1.13, BUN 23, Potassium 4.0, Sodium 133, BNP 148.5, Magnesium 1.8  Vital Signs: Weight: 113 lbs (discharge weight: 116 lbs) Blood pressure: 92/60 Heart rate: 78   Medication Assistance / Insurance Benefits Check: Does the patient have prescription insurance?  Yes Type of insurance plan: [employer]   Outpatient Pharmacy:  Current outpatient pharmacy: [facility] Was the [facility] pharmacy used to supply discharge medications? yes  If TOC pharmacy was used, were the refills transferred out to current pharmacy yet? no  Is the patient willing to transition their outpatient pharmacy to utilize a [facility] outpatient pharmacy with or without mail order?   No  Assessment: 1) Chronic systolic CHF (EF ). NYHA class I-II symptoms. - Given current soft BP today, will need to consider Entresto and Farxiga discontinuation. Additionally, these medications were not recommended by cardiology prior to discharge.  - Continue carvedilol 3.125mg  BID -Continue spironolactone 25mg  once daily  Plan: 1) Medication changes: - Discontinue Farxiga  -Discontinue Entresto   2) Follow up: - Next appointment with Cardiology on 10/17  3) Patient Assistance -Patient assistance started for [facility]. Will be added to the chart if needed at a later date.  [name] E.  Skeet Simmer,  PharmD PGY-1 [facility] Pharmacy Resident

## 2022-09-03 NOTE — Telephone Encounter (Signed)
Spoke with patient's daughter, [name]. She states there were medication changes made in the hospital and was not clear on what she could give her mother for pain, specifically Aleve. Advised daughter the only over the counter pain medications recommended is acetaminophen/Tylenol taken as directed and to follow up with her primary  physician for additional pain management.  She is scheduled with Dr. Acie Fredrickson on 09/22/2022.  Daughter verbalized understanding.

## 2022-09-03 NOTE — Telephone Encounter (Signed)
Pts great-granddaughter, [name], called to report that the pt was vomiting during her recent hospitalization and since her D/C yesterday she had vomited twice... she has been able to eat and drink some... she has had several med changes prior to D/C so she is not sure if it is a med causing her to be sick or if she has a virus... she will reach out to her PCP... I moved up the pts appt with Dr Acie Fredrickson per her request since she has had so many med changes they would feel better coming to see Dr Acie Fredrickson and not wait until the end of the month... I will also forward to Dr Acie Fredrickson for his review.
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
## 2022-09-04 ENCOUNTER — Telehealth (HOSPITAL_COMMUNITY): Payer: Self-pay

## 2022-09-04 ENCOUNTER — Encounter (HOSPITAL_COMMUNITY): Payer: Self-pay

## 2022-09-04 ENCOUNTER — Ambulatory Visit (HOSPITAL_COMMUNITY)
Admit: 2022-09-04 | Discharge: 2022-09-04 | Disposition: A | Discharge status: Home / Self Care | Payer: Medicare Other | Source: Ambulatory Visit | Attending: Physician Assistant | Admitting: Physician Assistant

## 2022-09-04 VITALS — BP 92/60 | HR 78 | Wt 113.2 lb

## 2022-09-04 DIAGNOSIS — I951 Orthostatic hypotension: Secondary | ICD-10-CM | POA: Diagnosis not present

## 2022-09-04 DIAGNOSIS — I447 Left bundle-branch block, unspecified: Secondary | ICD-10-CM

## 2022-09-04 DIAGNOSIS — F05 Delirium due to known physiological condition: Secondary | ICD-10-CM | POA: Insufficient documentation

## 2022-09-04 DIAGNOSIS — I2510 Atherosclerotic heart disease of native coronary artery without angina pectoris: Secondary | ICD-10-CM | POA: Insufficient documentation

## 2022-09-04 DIAGNOSIS — I4892 Unspecified atrial flutter: Secondary | ICD-10-CM | POA: Insufficient documentation

## 2022-09-04 DIAGNOSIS — Z79899 Other long term (current) drug therapy: Secondary | ICD-10-CM | POA: Diagnosis not present

## 2022-09-04 DIAGNOSIS — J45909 Unspecified asthma, uncomplicated: Secondary | ICD-10-CM | POA: Insufficient documentation

## 2022-09-04 DIAGNOSIS — R531 Weakness: Secondary | ICD-10-CM | POA: Diagnosis not present

## 2022-09-04 DIAGNOSIS — I5032 Chronic diastolic (congestive) heart failure: Secondary | ICD-10-CM | POA: Diagnosis not present

## 2022-09-04 DIAGNOSIS — I5020 Unspecified systolic (congestive) heart failure: Secondary | ICD-10-CM

## 2022-09-04 DIAGNOSIS — I440 Atrioventricular block, first degree: Secondary | ICD-10-CM | POA: Insufficient documentation

## 2022-09-04 DIAGNOSIS — R42 Dizziness and giddiness: Secondary | ICD-10-CM | POA: Diagnosis present

## 2022-09-04 DIAGNOSIS — I480 Paroxysmal atrial fibrillation: Secondary | ICD-10-CM | POA: Diagnosis not present

## 2022-09-04 DIAGNOSIS — Z7902 Long term (current) use of antithrombotics/antiplatelets: Secondary | ICD-10-CM | POA: Diagnosis not present

## 2022-09-04 DIAGNOSIS — R296 Repeated falls: Secondary | ICD-10-CM | POA: Insufficient documentation

## 2022-09-04 DIAGNOSIS — Z66 Do not resuscitate: Secondary | ICD-10-CM | POA: Diagnosis not present

## 2022-09-04 DIAGNOSIS — E785 Hyperlipidemia, unspecified: Secondary | ICD-10-CM | POA: Diagnosis not present

## 2022-09-04 DIAGNOSIS — Z7984 Long term (current) use of oral hypoglycemic drugs: Secondary | ICD-10-CM | POA: Insufficient documentation

## 2022-09-04 DIAGNOSIS — I110 Hypertensive heart disease with heart failure: Secondary | ICD-10-CM | POA: Diagnosis not present

## 2022-09-04 DIAGNOSIS — I959 Hypotension, unspecified: Secondary | ICD-10-CM

## 2022-09-04 DIAGNOSIS — I429 Cardiomyopathy, unspecified: Secondary | ICD-10-CM | POA: Insufficient documentation

## 2022-09-04 DIAGNOSIS — Z7901 Long term (current) use of anticoagulants: Secondary | ICD-10-CM | POA: Diagnosis not present

## 2022-09-04 DIAGNOSIS — K219 Gastro-esophageal reflux disease without esophagitis: Secondary | ICD-10-CM | POA: Diagnosis not present

## 2022-09-04 LAB — BASIC METABOLIC PANEL
Anion gap: 8 (ref 5–15)
BUN: 23 mg/dL (ref 8–23)
CO2: 28 mmol/L (ref 22–32)
Calcium: 9.5 mg/dL (ref 8.9–10.3)
Chloride: 97 mmol/L — ABNORMAL LOW (ref 98–111)
Creatinine, Ser: 1.13 mg/dL — ABNORMAL HIGH (ref 0.44–1.00)
GFR, Estimated: 46 mL/min — ABNORMAL LOW (ref >60)
Glucose, Bld: 126 mg/dL — ABNORMAL HIGH (ref 70–99)
Potassium: 4 mmol/L (ref 3.5–5.1)
Sodium: 133 mmol/L — ABNORMAL LOW (ref 135–145)

## 2022-09-04 LAB — BRAIN NATRIURETIC PEPTIDE: B Natriuretic Peptide: 148.5 pg/mL — ABNORMAL HIGH (ref 0.0–100.0)

## 2022-09-04 MED ORDER — FUROSEMIDE 20 MG PO TABS: 20.0000 mg | ORAL_TABLET | Freq: Every day | ORAL | 3 refills | Status: DC

## 2022-09-04 MED ORDER — ONDANSETRON HCL 4 MG PO TABS: 4.0000 mg | ORAL_TABLET | Freq: Three times a day (TID) | ORAL | 0 refills | Status: DC | PRN

## 2022-09-04 NOTE — Patient Instructions (Addendum)
EKG done today.   Labs done today. We will contact you only if your labs are abnormal.  STOP taking Farxiga   STOP taking Entresto  SHE MAKE TAKE 1 EXTRA TABLET OF LASIX AS NEEDED FOR SWELLING OR A WEIGHT GAIN OF 3 POUNDS OR MORE IN 24 HOURS.   No other medication changes were made. Please continue all current medications as prescribed.  Please keep your pending appointment with Dr. Acie Fredrickson on Tuesday Ocober 17th at 1:20pm.   Do the following things EVERYDAY: Weigh yourself in the morning before breakfast. Write it down and keep it in a log. Take your medicines as prescribed Eat low salt foods--Limit salt (sodium) to 2000 mg per day.  Stay as active as you can everyday Limit all fluids for the day to less than 2 liters

## 2022-09-04 NOTE — Telephone Encounter (Signed)
Patient was taken off Entresto and [place] during [facility] clinic today. If these medications are added back, please have [facility] provider complete and submit that patient assistance applications.

## 2022-09-04 NOTE — Telephone Encounter (Signed)
Called to confirm Heart & Vascular Transitions of Care appointment at 10AM today. Patient reminded to bring all medications and pill box organizer with them. Confirmed patient has transportation. Gave directions, instructed to utilize valet parking.  Confirmed appointment prior to ending call.    Haydyn Liddell, MSN, RN Heart Failure Nurse Navigator  

## 2022-09-04 NOTE — Telephone Encounter (Signed)
Heart Failure Patient Advocate Encounter  Patient medication assistance forms for Entresto [employer]) and Farxiga ([place]) have been started and attached to chart under 'media' tab.   Patient has signed all forms; Provider information will need to be completed before submitting.  Clista Bernhardt, CPhT Rx Patient Advocate Phone: [phone]

## 2022-09-04 NOTE — Telephone Encounter (Signed)
Nahser, Wonda Cheng, MD  Donnalee Curry K Caller: Unspecified Wilburn Mylar,  2:58 PM) I agree that she needs to see her primary MD for further evaluation and management of her nausea and vomiting .   She should wait until this is better before trying to come in and see me .   PN     Called and reiterated the above to pt's granddaughter [name] who states that pt's vomiting has been improved today. States she contacted PCP for prescription of ondansetron and was told that they wanted to see patient first. They are calling her back to provide time that she can bring pt to their office to be evaluated. [name] states she doesn't feel this is a viral disease process since pt has no fever and all began with the medication changes post hospital discharge. She will follow with PCP and as of now, keep appt scheduled for 09/08/22.

## 2022-09-07 ENCOUNTER — Encounter: Payer: Self-pay | Admitting: Cardiovascular Disease
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
## 2022-09-08 ENCOUNTER — Telehealth: Payer: Self-pay | Admitting: Nurse Practitioner

## 2022-09-08 ENCOUNTER — Ambulatory Visit: Payer: Medicare Other | Attending: Cardiovascular Disease | Admitting: Cardiovascular Disease

## 2022-09-08 ENCOUNTER — Encounter: Payer: Self-pay | Admitting: Cardiovascular Disease

## 2022-09-08 VITALS — BP 130/70 | HR 61 | Ht <= 58 in | Wt 113.6 lb

## 2022-09-08 DIAGNOSIS — I4819 Other persistent atrial fibrillation: Secondary | ICD-10-CM

## 2022-09-08 DIAGNOSIS — I2510 Atherosclerotic heart disease of native coronary artery without angina pectoris: Secondary | ICD-10-CM | POA: Diagnosis not present

## 2022-09-08 DIAGNOSIS — I4891 Unspecified atrial fibrillation: Secondary | ICD-10-CM | POA: Insufficient documentation

## 2022-09-08 DIAGNOSIS — E860 Dehydration: Secondary | ICD-10-CM | POA: Diagnosis not present

## 2022-09-08 DIAGNOSIS — I5033 Acute on chronic diastolic (congestive) heart failure: Secondary | ICD-10-CM | POA: Diagnosis not present

## 2022-09-08 DIAGNOSIS — I480 Paroxysmal atrial fibrillation: Secondary | ICD-10-CM | POA: Insufficient documentation

## 2022-09-08 MED ORDER — FUROSEMIDE 20 MG PO TABS: ORAL_TABLET | ORAL | 3 refills | Status: DC

## 2022-09-08 MED ORDER — AMIODARONE HCL 200 MG PO TABS: ORAL_TABLET | ORAL | 3 refills | Status: DC

## 2022-09-08 NOTE — Telephone Encounter (Signed)
Called & spoke w/ Granddaughter. Advised of directions from [place]/

## 2022-09-08 NOTE — Telephone Encounter (Signed)
Called & provided verbal order  

## 2022-09-08 NOTE — Telephone Encounter (Signed)
Sree from [facility] said pt need verbal orders   1-wk for 1  2-wk for 4 and restarted on 10.14.23

## 2022-09-08 NOTE — Telephone Encounter (Signed)
Pt haven't had a bowel movement since last week and need something to make her go/loosen .Marland Kitchen You can call her daughter Shellee Milo @ [phone]

## 2022-09-08 NOTE — Patient Instructions (Signed)
Medication Instructions:  DECREASE Furosemide to 20 mg three times per week (next dose Friday) DECREASE Amiodarone to 100mg  daily (1/2 tablet) *If you need a refill on your cardiac medications before your next appointment, please call your pharmacy*   Lab Work: BMET today If you have labs (blood work) drawn today and your tests are completely normal, you will receive your results only by: [place] (if you have MyChart) OR A paper copy in the mail If you have any lab test that is abnormal or we need to change your treatment, we will call you to review the results.   Testing/Procedures: NONE   Follow-Up: At [facility], you and your health needs are our priority.  As part of our continuing mission to provide you with exceptional heart care, we have created designated Provider Care Teams.  These Care Teams include your primary Cardiologist (physician) and Advanced Practice Providers (APPs -  Physician Assistants and Nurse Practitioners) who all work together to provide you with the care you need, when you need it.  Your next appointment:   6 week(s)  The format for your next appointment:   In Person  Provider:   Vida Roller, or Dick  Other Instructions Handicapped placard provided  Important Information About Sugar

## 2022-09-09 ENCOUNTER — Encounter: Payer: Self-pay | Admitting: Nurse Practitioner

## 2022-09-09 ENCOUNTER — Ambulatory Visit (INDEPENDENT_AMBULATORY_CARE_PROVIDER_SITE_OTHER): Payer: Medicare Other | Admitting: Nurse Practitioner

## 2022-09-09 VITALS — BP 112/62 | HR 78 | Temp 97.6°F | Ht <= 58 in | Wt 112.8 lb

## 2022-09-09 DIAGNOSIS — K219 Gastro-esophageal reflux disease without esophagitis: Secondary | ICD-10-CM

## 2022-09-09 DIAGNOSIS — I951 Orthostatic hypotension: Secondary | ICD-10-CM | POA: Diagnosis not present

## 2022-09-09 DIAGNOSIS — K5903 Drug induced constipation: Secondary | ICD-10-CM

## 2022-09-09 DIAGNOSIS — T50905A Adverse effect of unspecified drugs, medicaments and biological substances, initial encounter: Secondary | ICD-10-CM | POA: Insufficient documentation

## 2022-09-09 LAB — BASIC METABOLIC PANEL
BUN/Creatinine Ratio: 24 (ref 12–28)
BUN: 27 mg/dL (ref 10–36)
CO2: 27 mmol/L (ref 20–29)
Calcium: 9.9 mg/dL (ref 8.7–10.3)
Chloride: 95 mmol/L — ABNORMAL LOW (ref 96–106)
Creatinine, Ser: 1.14 mg/dL — ABNORMAL HIGH (ref 0.57–1.00)
Glucose: 117 mg/dL — ABNORMAL HIGH (ref 70–99)
Potassium: 3.9 mmol/L (ref 3.5–5.2)
Sodium: 136 mmol/L (ref 134–144)
eGFR: 45 mL/min/{1.73_m2} — ABNORMAL LOW (ref >59)

## 2022-09-09 LAB — CBC
HCT: 40.8 % (ref 36.0–46.0)
Hemoglobin: 13.7 g/dL (ref 12.0–15.0)
MCHC: 33.5 g/dL (ref 30.0–36.0)
MCV: 93.3 fl (ref 78.0–100.0)
Platelets: 335 10*3/uL (ref 150.0–400.0)
RBC: 4.38 Mil/uL (ref 3.87–5.11)
RDW: 13.9 % (ref 11.5–15.5)
WBC: 7.5 10*3/uL (ref 4.0–10.5)

## 2022-09-09 MED ORDER — OMEPRAZOLE 20 MG PO CPDR: 20.0000 mg | DELAYED_RELEASE_CAPSULE | Freq: Every day | ORAL | 0 refills | Status: DC

## 2022-09-09 NOTE — Patient Instructions (Addendum)
For constipation: try colace 100mg  2x/day or Senokot 1tab 2x/day with glycerin suppository once a day as needed. Increase oral hydration with water: 1568ml daily or 50oz daily Call office if BM in 48hrs Change position slowly to avoid falling Check BP in Am and Pm before taking BP medications Do not take coreg/carvedilol if BP <100/60 Start zofran for nasuea at least twice a day. Start omeprazole for possible GERD Eat small frequent meals

## 2022-09-09 NOTE — Assessment & Plan Note (Signed)
Nausea and vomiting during and after hospitalization,  reports poor appetite, oral hydration and constipation Unable to tolerate pantoprazole in past (cough), but able to take omeprazole per Ms. Bohorquez and daughter. No blood in emesis, no ABD pain Advised to start zofran BiD, colace or senokot and glycerin suppository, omeprazole daily in AM, eat small frequent meals, maintain 1537ml of water intake F/up in 37month

## 2022-09-09 NOTE — Progress Notes (Signed)
Established Patient Visit  Patient: [name]   DOB: 07/28/1928   88 y.o. Female  MRN: [id number] Visit Date: 09/09/2022  Subjective:    Chief Complaint  Patient presents with   [facility] F/u 08/26/22 Says she has gotten better , but has no memory of her being in the hospital  No concerns    HPI Accompanied by her daughter. Hospitalization: 08/26/22 to 09/02/22 due to SOB with exertion due to Acute of chronic CHF, CAD and episodic A-fib. During hospitalization, she had PTCA with Stent and multiple medication changes. We reconciled medication list today with patient, daughter and grand-daughter via phone. She had hospital f/up with cardiology on 09/08/2022: amiodarone and furosemide doses decreased to due to poor oral hydration. Today she reports some SOB with exertion but no CP Reviewed hospital discharge summary, lab results and radiology reports.  GERD (gastroesophageal reflux disease) Nausea and vomiting during and after hospitalization,  reports poor appetite, oral hydration and constipation Unable to tolerate pantoprazole in past (cough), but able to take omeprazole per Ms. Fasig and daughter. No blood in emesis, no ABD pain Advised to start zofran BiD, colace or senokot and glycerin suppository, omeprazole daily in AM, eat small frequent meals, maintain 1569m of water intake F/up in 152monthOrthostatic hypotension Intermittent dizziness Advised to increase oral hydration to 150022mer day. Change position slowly to avoid falling Check BP in Am and Pm before taking BP medications Do not take coreg/carvedilol if BP <100/60 Check CBC  Wt Readings from Last 3 Encounters:  09/09/22 112 lb 12.8 oz (51.2 kg)  09/08/22 113 lb 9.6 oz (51.5 kg)  09/04/22 113 lb 3.2 oz (51.3 kg)    BP Readings from Last 3 Encounters:  09/09/22 112/62  09/08/22 130/70  09/04/22 92/60    Reviewed medical, surgical, and social history  today  Medications: Outpatient Medications Prior to Visit  Medication Sig   acetaminophen (TYLENOL) 325 MG tablet Take 2 tablets (650 mg total) by mouth every 6 (six) hours as needed for mild pain (or Fever >/= 101).   albuterol (VENTOLIN HFA) 108 (90 Base) MCG/ACT inhaler Inhale 1-2 puffs into the lungs every 6 (six) hours as needed for wheezing or shortness of breath.   amiodarone (PACERONE) 200 MG tablet Take 1/2 tablet by mouth daily   apixaban (ELIQUIS) 2.5 MG TABS tablet Take 1 tablet (2.5 mg total) by mouth 2 (two) times daily.   carvedilol (COREG) 3.125 MG tablet Take 1 tablet (3.125 mg total) by mouth 2 (two) times daily with a meal.   ezetimibe (ZETIA) 10 MG tablet TAKE 1 TABLET BY MOUTH  DAILY   furosemide (LASIX) 20 MG tablet Take 1 tablet by mouth three times per week   Multiple Vitamins-Minerals (PRESERVISION AREDS 2+MULTI VIT PO) Take 1 capsule by mouth in the morning and at bedtime.   spironolactone (ALDACTONE) 25 MG tablet Take 1 tablet (25 mg total) by mouth daily.   [DISCONTINUED] clopidogrel (PLAVIX) 75 MG tablet Take 75 mg by mouth daily.   ondansetron (ZOFRAN) 4 MG tablet Take 1 tablet (4 mg total) by mouth every 8 (eight) hours as needed for nausea or vomiting. (Patient not taking: Reported on 09/09/2022)   No facility-administered medications prior to visit.   Reviewed past medical and social history.   ROS per HPI above  Last CBC Lab Results  Component Value Date  WBC 9.2 09/01/2022   HGB 13.8 09/01/2022   HCT 40.4 09/01/2022   MCV 92.4 09/01/2022   MCH 31.6 09/01/2022   RDW 13.6 09/01/2022   PLT 240 61/68/3729   Last metabolic panel Lab Results  Component Value Date   GLUCOSE 117 (H) 09/08/2022   NA 136 09/08/2022   K 3.9 09/08/2022   CL 95 (L) 09/08/2022   CO2 27 09/08/2022   BUN 27 09/08/2022   CREATININE 1.14 (H) 09/08/2022   EGFR 45 (L) 09/08/2022   CALCIUM 9.9 09/08/2022   PROT 6.8 08/25/2022   ALBUMIN 3.5 08/25/2022   BILITOT 0.9  08/25/2022   ALKPHOS 70 08/25/2022   AST 33 08/25/2022   ALT 22 08/25/2022   ANIONGAP 8 09/04/2022        Objective:  BP 112/62 (BP Location: Right Arm, Patient Position: Sitting, Cuff Size: Small)   Pulse 78   Temp 97.6 F (36.4 C) (Temporal)   Ht 4' 10"  (1.473 m)   Wt 112 lb 12.8 oz (51.2 kg)   SpO2 97%   BMI 23.58 kg/m      Physical Exam Cardiovascular:     Rate and Rhythm: Normal rate and regular rhythm.     Pulses: Normal pulses.     Heart sounds: Normal heart sounds.  Pulmonary:     Effort: Pulmonary effort is normal.     Breath sounds: Normal breath sounds.  Abdominal:     General: Bowel sounds are normal. There is no distension.     Palpations: Abdomen is soft.     Tenderness: There is no abdominal tenderness. There is no guarding.  Musculoskeletal:     Right lower leg: No edema.     Left lower leg: No edema.  Skin:    Findings: No bruising.  Neurological:     Mental Status: She is alert and oriented to person, place, and time.     No results found for any visits on 09/09/22.    Assessment & Plan:    Problem List Items Addressed This Visit       Cardiovascular and Mediastinum   Orthostatic hypotension - Primary    Intermittent dizziness Advised to increase oral hydration to 15364m per day. Change position slowly to avoid falling Check BP in Am and Pm before taking BP medications Do not take coreg/carvedilol if BP <100/60 Check CBC      Relevant Orders   CBC     Digestive   GERD (gastroesophageal reflux disease) (Chronic)    Nausea and vomiting during and after hospitalization,  reports poor appetite, oral hydration and constipation Unable to tolerate pantoprazole in past (cough), but able to take omeprazole per Ms. Burklow and daughter. No blood in emesis, no ABD pain Advised to start zofran BiD, colace or senokot and glycerin suppository, omeprazole daily in AM, eat small frequent meals, maintain 15074mof water intake F/up in 64m88month    Relevant Medications   omeprazole (PRILOSEC) 20 MG capsule   Other Visit Diagnoses     Drug-induced constipation          Return in about 4 weeks (around 10/07/2022) for nause and constipation.     ChaWilfred LacyP

## 2022-09-09 NOTE — Assessment & Plan Note (Signed)
Intermittent dizziness Advised to increase oral hydration to 1569ml per day. Change position slowly to avoid falling Check BP in Am and Pm before taking BP medications Do not take coreg/carvedilol if BP <100/60 Check CBC

## 2022-09-14 DIAGNOSIS — I5021 Acute systolic (congestive) heart failure: Secondary | ICD-10-CM | POA: Diagnosis not present

## 2022-09-14 DIAGNOSIS — Z9181 History of falling: Secondary | ICD-10-CM

## 2022-09-14 DIAGNOSIS — I2510 Atherosclerotic heart disease of native coronary artery without angina pectoris: Secondary | ICD-10-CM

## 2022-09-14 DIAGNOSIS — I4891 Unspecified atrial fibrillation: Secondary | ICD-10-CM

## 2022-09-14 DIAGNOSIS — I5033 Acute on chronic diastolic (congestive) heart failure: Secondary | ICD-10-CM | POA: Diagnosis not present

## 2022-09-14 DIAGNOSIS — Z7901 Long term (current) use of anticoagulants: Secondary | ICD-10-CM

## 2022-09-14 DIAGNOSIS — I951 Orthostatic hypotension: Secondary | ICD-10-CM

## 2022-09-14 DIAGNOSIS — Z955 Presence of coronary angioplasty implant and graft: Secondary | ICD-10-CM

## 2022-09-14 DIAGNOSIS — Z7984 Long term (current) use of oral hypoglycemic drugs: Secondary | ICD-10-CM

## 2022-09-14 DIAGNOSIS — E559 Vitamin D deficiency, unspecified: Secondary | ICD-10-CM

## 2022-09-14 DIAGNOSIS — I110 Hypertensive heart disease with heart failure: Secondary | ICD-10-CM | POA: Diagnosis not present

## 2022-09-14 DIAGNOSIS — E785 Hyperlipidemia, unspecified: Secondary | ICD-10-CM

## 2022-09-14 DIAGNOSIS — I447 Left bundle-branch block, unspecified: Secondary | ICD-10-CM | POA: Diagnosis not present

## 2022-09-14 DIAGNOSIS — J9601 Acute respiratory failure with hypoxia: Secondary | ICD-10-CM

## 2022-09-14 DIAGNOSIS — J45909 Unspecified asthma, uncomplicated: Secondary | ICD-10-CM

## 2022-09-18 ENCOUNTER — Telehealth: Payer: Self-pay

## 2022-09-18 NOTE — Progress Notes (Signed)
Chronic Care Management Pharmacy Assistant   Name: [name]  MRN: [id number] DOB: August 29, 1928  Reason for Encounter: Medication Review/General Adherence Call.   Recent office visits:  09/09/2022 Wilfred Lacy NP (PCP)Do not take coreg/carvedilol if BP <100/60, Start omeprazole daily in AM,Return in about 4 weeks 09/08/2022 Wilfred Lacy NP (PCP) start senna/senokot 1tab BID OTC 09/03/2022 Wilfred Lacy NP (PCP) start zofran 4 mg PRN  Recent consult visits:  09/08/2022 Dr. Acie Fredrickson MD (Cardiology) hold Lasix for the next 2 days and then reduce the dose to 20 mg 3 days a week,reduce the amiodarone to 100 mg a day 08/25/2022 Dr. Cathie Olden MD (Cardiology) Stop Midodrine  Hospital visits:  Medication Reconciliation was completed by comparing discharge summary, patient's EMR and Pharmacy list, and upon discussion with patient.  Admitted to the hospital on 08/25/2022 due to Acute CHF. Discharge date was 09/02/2022. Discharged from [place]?Medications Started at [facility] Discharge:?? -started amiodarone, Coreg Farxiga Aldactone Entresto spironolactone, Furosemide,Eliquis  Medication Changes at Hospital Discharge: -Changed None ID  Medications Discontinued at Hospital Discharge: -Stopped aspirin, atenolol, cholecalciferol, isosorbide mononitrate, losartan, midodrine ,nitroglycerin, Repatha SureClick, Tums Ultra  Medications that remain the same after Hospital Discharge:??  -All other medications will remain the same.   . Admitted to the hospital on 07/07/2022 due to Fall. Discharge date was 07/07/2022. Discharged from [facility].    Patient left without being seen.  Medications: Outpatient Encounter Medications as of 09/18/2022  Medication Sig   acetaminophen (TYLENOL) 325 MG tablet Take 2 tablets (650 mg total) by mouth every 6 (six) hours as needed for mild pain (or Fever >/= 101).   albuterol (VENTOLIN HFA) 108 (90 Base) MCG/ACT inhaler Inhale  1-2 puffs into the lungs every 6 (six) hours as needed for wheezing or shortness of breath.   amiodarone (PACERONE) 200 MG tablet Take 1/2 tablet by mouth daily   apixaban (ELIQUIS) 2.5 MG TABS tablet Take 1 tablet (2.5 mg total) by mouth 2 (two) times daily.   carvedilol (COREG) 3.125 MG tablet Take 1 tablet (3.125 mg total) by mouth 2 (two) times daily with a meal.   clopidogrel (PLAVIX) 75 MG tablet Take 75 mg by mouth daily.   ezetimibe (ZETIA) 10 MG tablet TAKE 1 TABLET BY MOUTH  DAILY   furosemide (LASIX) 20 MG tablet Take 1 tablet by mouth three times per week   Multiple Vitamins-Minerals (PRESERVISION AREDS 2+MULTI VIT PO) Take 1 capsule by mouth in the morning and at bedtime.   omeprazole (PRILOSEC) 20 MG capsule Take 1 capsule (20 mg total) by mouth daily.   ondansetron (ZOFRAN) 4 MG tablet Take 1 tablet (4 mg total) by mouth every 8 (eight) hours as needed for nausea or vomiting. (Patient not taking: Reported on 09/09/2022)   spironolactone (ALDACTONE) 25 MG tablet Take 1 tablet (25 mg total) by mouth daily.   No facility-administered encounter medications on file as of 09/18/2022.    Care Gaps: None ID   Star Rating Drugs: None ID   Called patient and discussed medication adherence  with patient, no issues at this time with current medication.   Patient Reports ED visit since her last CPP follow up.  Admitted to the hospital on 08/25/2022 due to Acute CHF. Discharge date was 09/02/2022. Discharged from [place]?Medications Started at Hospital Discharge:??-started amiodarone, Coreg Farxiga Aldactone Entresto spironolactone, Furosemide,Eliquis  Medication Changes at Hospital Discharge:-Changed None ID  Medications Discontinued at  Hospital Discharge:-Stopped aspirin, atenolol, cholecalciferol, isosorbide mononitrate, losartan, midodrine ,nitroglycerin, Repatha SureClick, Tums Ultra  Medications that remain the same after Hospital Discharge:??   -All other  medications will remain the same.    Patient Denies  any side effects with her medication.  Patient Denies  any problems with hercurrent pharmacy  Patient reports she is doing good, and denies any questions/concerns for the clinical pharmacists.Patient denies any sides effects from her new medications or any questions.Patient reports she had her follow up with her PCP and cardiologist.Patient is aware to return my call if she has any questions or concerns.  [place] Pharmacist Assistant [phone]

## 2022-09-22 ENCOUNTER — Ambulatory Visit: Payer: Medicare Other | Admitting: Cardiovascular Disease

## 2022-09-24 ENCOUNTER — Other Ambulatory Visit (HOSPITAL_COMMUNITY): Payer: Self-pay

## 2022-10-02 ENCOUNTER — Other Ambulatory Visit (HOSPITAL_COMMUNITY): Payer: Self-pay
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
## 2022-10-20 ENCOUNTER — Encounter: Payer: Self-pay | Admitting: Nurse Practitioner

## 2022-10-20 ENCOUNTER — Ambulatory Visit: Payer: Medicare Other | Attending: Nurse Practitioner | Admitting: Nurse Practitioner

## 2022-10-20 VITALS — BP 132/64 | HR 62 | Ht <= 58 in | Wt 114.8 lb

## 2022-10-20 DIAGNOSIS — I10 Essential (primary) hypertension: Secondary | ICD-10-CM | POA: Diagnosis not present

## 2022-10-20 DIAGNOSIS — I5032 Chronic diastolic (congestive) heart failure: Secondary | ICD-10-CM

## 2022-10-20 DIAGNOSIS — I447 Left bundle-branch block, unspecified: Secondary | ICD-10-CM

## 2022-10-20 DIAGNOSIS — E7800 Pure hypercholesterolemia, unspecified: Secondary | ICD-10-CM

## 2022-10-20 DIAGNOSIS — I4819 Other persistent atrial fibrillation: Secondary | ICD-10-CM | POA: Diagnosis not present

## 2022-10-20 LAB — BASIC METABOLIC PANEL
BUN/Creatinine Ratio: 17 (ref 12–28)
BUN: 14 mg/dL (ref 10–36)
CO2: 29 mmol/L (ref 20–29)
Calcium: 9.8 mg/dL (ref 8.7–10.3)
Chloride: 97 mmol/L (ref 96–106)
Creatinine, Ser: 0.84 mg/dL (ref 0.57–1.00)
Glucose: 96 mg/dL (ref 70–99)
Potassium: 4.2 mmol/L (ref 3.5–5.2)
Sodium: 138 mmol/L (ref 134–144)
eGFR: 65 mL/min/{1.73_m2} (ref >59)

## 2022-10-20 NOTE — Patient Instructions (Signed)
Medication Instructions:  Your physician recommends that you continue on your current medications as directed. Please refer to the Current Medication list given to you today. *If you need a refill on your cardiac medications before your next appointment, please call your pharmacy*   Lab Work: TODAY-BMET If you have labs (blood work) drawn today and your tests are completely normal, you will receive your results only by: MyChart Message (if you have MyChart) OR A paper copy in the mail If you have any lab test that is abnormal or we need to change your treatment, we will call you to review the results.   Testing/Procedures: NONE    Follow-Up: At [facility], you and your health needs are our priority.  As part of our continuing mission to provide you with exceptional heart care, we have created designated Provider Care Teams.  These Care Teams include your primary Cardiologist (physician) and Advanced Practice Providers (APPs -  Physician Assistants and Nurse Practitioners) who all work together to provide you with the care you need, when you need it.  We recommend signing up for the patient portal called "MyChart".  Sign up information is provided on this After Visit Summary.  MyChart is used to connect with patients for Virtual Visits (Telemedicine).  Patients are able to view lab/test results, encounter notes, upcoming appointments, etc.  Non-urgent messages can be sent to your provider as well.   To learn more about what you can do with MyChart, go to [email or web address].    Your next appointment:   3 month(s)  The format for your next appointment:   In Person  Provider:   [name], MD  or Robin Searing, NP   Other Instructions   Important Information About Sugar

## 2022-10-25 ENCOUNTER — Emergency Department (HOSPITAL_COMMUNITY): Payer: Medicare Other

## 2022-10-25 ENCOUNTER — Encounter (HOSPITAL_COMMUNITY): Payer: Self-pay

## 2022-10-25 ENCOUNTER — Other Ambulatory Visit: Payer: Self-pay

## 2022-10-25 ENCOUNTER — Observation Stay (HOSPITAL_COMMUNITY)
Admission: EM | Admit: 2022-10-25 | Discharge: 2022-10-26 | Disposition: A | Discharge status: Home / Self Care | Payer: Medicare Other | Attending: Family Medicine | Admitting: Family Medicine

## 2022-10-25 DIAGNOSIS — S0232XA Fracture of orbital floor, left side, initial encounter for closed fracture: Principal | ICD-10-CM | POA: Insufficient documentation

## 2022-10-25 DIAGNOSIS — I25119 Atherosclerotic heart disease of native coronary artery with unspecified angina pectoris: Secondary | ICD-10-CM | POA: Diagnosis present

## 2022-10-25 DIAGNOSIS — I2510 Atherosclerotic heart disease of native coronary artery without angina pectoris: Secondary | ICD-10-CM | POA: Diagnosis not present

## 2022-10-25 DIAGNOSIS — Z7902 Long term (current) use of antithrombotics/antiplatelets: Secondary | ICD-10-CM | POA: Insufficient documentation

## 2022-10-25 DIAGNOSIS — S02401S Maxillary fracture, unspecified, sequela: Secondary | ICD-10-CM | POA: Diagnosis present

## 2022-10-25 DIAGNOSIS — S12501A Unspecified nondisplaced fracture of sixth cervical vertebra, initial encounter for closed fracture: Secondary | ICD-10-CM | POA: Diagnosis not present

## 2022-10-25 DIAGNOSIS — J45909 Unspecified asthma, uncomplicated: Secondary | ICD-10-CM | POA: Diagnosis not present

## 2022-10-25 DIAGNOSIS — J40 Bronchitis, not specified as acute or chronic: Secondary | ICD-10-CM | POA: Insufficient documentation

## 2022-10-25 DIAGNOSIS — I5042 Chronic combined systolic (congestive) and diastolic (congestive) heart failure: Secondary | ICD-10-CM | POA: Insufficient documentation

## 2022-10-25 DIAGNOSIS — Y92003 Bedroom of unspecified non-institutional (private) residence as the place of occurrence of the external cause: Secondary | ICD-10-CM | POA: Insufficient documentation

## 2022-10-25 DIAGNOSIS — Z7901 Long term (current) use of anticoagulants: Secondary | ICD-10-CM | POA: Diagnosis not present

## 2022-10-25 DIAGNOSIS — S12500A Unspecified displaced fracture of sixth cervical vertebra, initial encounter for closed fracture: Secondary | ICD-10-CM | POA: Diagnosis present

## 2022-10-25 DIAGNOSIS — S02401A Maxillary fracture, unspecified, initial encounter for closed fracture: Secondary | ICD-10-CM | POA: Diagnosis present

## 2022-10-25 DIAGNOSIS — Z1152 Encounter for screening for COVID-19: Secondary | ICD-10-CM | POA: Diagnosis not present

## 2022-10-25 DIAGNOSIS — I5043 Acute on chronic combined systolic (congestive) and diastolic (congestive) heart failure: Secondary | ICD-10-CM | POA: Diagnosis present

## 2022-10-25 DIAGNOSIS — W19XXXA Unspecified fall, initial encounter: Secondary | ICD-10-CM

## 2022-10-25 DIAGNOSIS — S0240DA Maxillary fracture, left side, initial encounter for closed fracture: Secondary | ICD-10-CM | POA: Insufficient documentation

## 2022-10-25 DIAGNOSIS — I110 Hypertensive heart disease with heart failure: Secondary | ICD-10-CM | POA: Diagnosis not present

## 2022-10-25 DIAGNOSIS — S0285XA Fracture of orbit, unspecified, initial encounter for closed fracture: Secondary | ICD-10-CM | POA: Diagnosis present

## 2022-10-25 DIAGNOSIS — Z79899 Other long term (current) drug therapy: Secondary | ICD-10-CM | POA: Insufficient documentation

## 2022-10-25 DIAGNOSIS — S0285XD Fracture of orbit, unspecified, subsequent encounter for fracture with routine healing: Secondary | ICD-10-CM | POA: Diagnosis present

## 2022-10-25 DIAGNOSIS — S12401A Unspecified nondisplaced fracture of fifth cervical vertebra, initial encounter for closed fracture: Secondary | ICD-10-CM | POA: Diagnosis not present

## 2022-10-25 DIAGNOSIS — I480 Paroxysmal atrial fibrillation: Secondary | ICD-10-CM | POA: Diagnosis not present

## 2022-10-25 DIAGNOSIS — Z955 Presence of coronary angioplasty implant and graft: Secondary | ICD-10-CM | POA: Insufficient documentation

## 2022-10-25 DIAGNOSIS — W01190A Fall on same level from slipping, tripping and stumbling with subsequent striking against furniture, initial encounter: Secondary | ICD-10-CM | POA: Insufficient documentation

## 2022-10-25 DIAGNOSIS — I951 Orthostatic hypotension: Secondary | ICD-10-CM | POA: Diagnosis present

## 2022-10-25 DIAGNOSIS — E785 Hyperlipidemia, unspecified: Secondary | ICD-10-CM | POA: Diagnosis present

## 2022-10-25 DIAGNOSIS — I10 Essential (primary) hypertension: Secondary | ICD-10-CM | POA: Diagnosis present

## 2022-10-25 DIAGNOSIS — R55 Syncope and collapse: Secondary | ICD-10-CM | POA: Diagnosis present

## 2022-10-25 DIAGNOSIS — Z8781 Personal history of (healed) traumatic fracture: Secondary | ICD-10-CM | POA: Diagnosis present

## 2022-10-25 HISTORY — DX: Unspecified displaced fracture of sixth cervical vertebra, initial encounter for closed fracture: S12.500A

## 2022-10-25 LAB — RESP PANEL BY RT-PCR (FLU A&B, COVID) ARPGX2
Influenza A by PCR: NEGATIVE
Influenza B by PCR: NEGATIVE
SARS Coronavirus 2 by RT PCR: NEGATIVE

## 2022-10-25 LAB — CBC WITH DIFFERENTIAL/PLATELET
Abs Immature Granulocytes: 0.04 10*3/uL (ref 0.00–0.07)
Basophils Absolute: 0 10*3/uL (ref 0.0–0.1)
Basophils Relative: 1 %
Eosinophils Absolute: 0.2 10*3/uL (ref 0.0–0.5)
Eosinophils Relative: 3 %
HCT: 40.3 % (ref 36.0–46.0)
Hemoglobin: 13.2 g/dL (ref 12.0–15.0)
Immature Granulocytes: 1 %
Lymphocytes Relative: 19 %
Lymphs Abs: 1.7 10*3/uL (ref 0.7–4.0)
MCH: 31.6 pg (ref 26.0–34.0)
MCHC: 32.8 g/dL (ref 30.0–36.0)
MCV: 96.4 fL (ref 80.0–100.0)
Monocytes Absolute: 0.5 10*3/uL (ref 0.1–1.0)
Monocytes Relative: 6 %
Neutro Abs: 6.2 10*3/uL (ref 1.7–7.7)
Neutrophils Relative %: 70 %
Platelets: 193 10*3/uL (ref 150–400)
RBC: 4.18 MIL/uL (ref 3.87–5.11)
RDW: 14.1 % (ref 11.5–15.5)
WBC: 8.7 10*3/uL (ref 4.0–10.5)
nRBC: 0 % (ref 0.0–0.2)

## 2022-10-25 LAB — SAMPLE TO BLOOD BANK

## 2022-10-25 LAB — LACTIC ACID, PLASMA: Lactic Acid, Venous: 1.3 mmol/L (ref 0.5–1.9)

## 2022-10-25 LAB — COMPREHENSIVE METABOLIC PANEL
ALT: 12 U/L (ref 0–44)
AST: 25 U/L (ref 15–41)
Albumin: 3.6 g/dL (ref 3.5–5.0)
Alkaline Phosphatase: 57 U/L (ref 38–126)
Anion gap: 9 (ref 5–15)
BUN: 14 mg/dL (ref 8–23)
CO2: 26 mmol/L (ref 22–32)
Calcium: 9 mg/dL (ref 8.9–10.3)
Chloride: 101 mmol/L (ref 98–111)
Creatinine, Ser: 0.78 mg/dL (ref 0.44–1.00)
GFR, Estimated: >60 mL/min (ref >60)
Glucose, Bld: 121 mg/dL — ABNORMAL HIGH (ref 70–99)
Potassium: 3.8 mmol/L (ref 3.5–5.1)
Sodium: 136 mmol/L (ref 135–145)
Total Bilirubin: 0.6 mg/dL (ref 0.3–1.2)
Total Protein: 6.8 g/dL (ref 6.5–8.1)

## 2022-10-25 LAB — PROTIME-INR
INR: 1.1 (ref 0.8–1.2)
Prothrombin Time: 14.4 seconds (ref 11.4–15.2)

## 2022-10-25 LAB — TROPONIN I (HIGH SENSITIVITY): Troponin I (High Sensitivity): 17 ng/L (ref <18)

## 2022-10-25 MED ORDER — FENTANYL CITRATE PF 50 MCG/ML IJ SOSY
25.0000 ug | PREFILLED_SYRINGE | Freq: Once | INTRAMUSCULAR | Status: AC
  Administered 2022-10-25: 25 ug via INTRAVENOUS
  Filled 2022-10-25: qty 1

## 2022-10-25 MED ORDER — MORPHINE SULFATE (PF) 2 MG/ML IV SOLN
2.0000 mg | Freq: Once | INTRAVENOUS | Status: AC
  Administered 2022-10-25: 2 mg via INTRAVENOUS
  Filled 2022-10-25: qty 1

## 2022-10-25 NOTE — Progress Notes (Signed)
Reviewed CT cervical spine which showed a right lamina fracture of C6 and a right superior articular process fracture of C7. No surgical intervention warranted. Hard collar at all times and we will see her in the office in 2 weeks

## 2022-10-25 NOTE — ED Notes (Signed)
Attempted to obtain urine sample x3. Pt states she is still unable to urinate at this time.
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
## 2022-10-25 NOTE — ED Provider Notes (Signed)
[facility] EMERGENCY DEPARTMENT Provider Note   CSN: [id number] Arrival date & time: 10/25/22  2013     History  Chief Complaint  Patient presents with   [name] is a 88 y.o. female with a past medical history of heart failure, CAD, syncope and recurrent falls presenting today after fall.  She is on Eliquis for A-fib.  She tells me that she has a chicken that stays in her bedroom that she takes care of.  She was ambulating into her bedroom and she believes that she lost consciousness and woke up on the ground.  She believes that she struck her face on a shoe rack on her door.  Fall       Home Medications Prior to Admission medications   Medication Sig Start Date End Date Taking? Authorizing Provider  acetaminophen (TYLENOL) 325 MG tablet Take 2 tablets (650 mg total) by mouth every 6 (six) hours as needed for mild pain (or Fever >/= 101). 09/02/22   Nita Sells, MD  albuterol (VENTOLIN HFA) 108 (90 Base) MCG/ACT inhaler Inhale 1-2 puffs into the lungs every 6 (six) hours as needed for wheezing or shortness of breath. 01/24/21   Nche, Charlene Brooke, NP  amiodarone (PACERONE) 200 MG tablet Take 1/2 tablet by mouth daily 09/08/22   Nahser, Wonda Cheng, MD  apixaban (ELIQUIS) 2.5 MG TABS tablet Take 1 tablet (2.5 mg total) by mouth 2 (two) times daily. 08/28/22   Geradine Girt, DO  carvedilol (COREG) 3.125 MG tablet Take 1 tablet (3.125 mg total) by mouth 2 (two) times daily with a meal. 09/02/22   Nita Sells, MD  clopidogrel (PLAVIX) 75 MG tablet Take 75 mg by mouth daily.    [provider]  ezetimibe (ZETIA) 10 MG tablet TAKE 1 TABLET BY MOUTH  DAILY 06/30/22   Nahser, Wonda Cheng, MD  furosemide (LASIX) 20 MG tablet Take 1 tablet by mouth three times per week 09/08/22   Nahser, Wonda Cheng, MD  Multiple Vitamins-Minerals (PRESERVISION AREDS 2+MULTI VIT PO) Take 1 capsule by mouth in the morning and at bedtime.    [provider]  spironolactone (ALDACTONE) 25 MG tablet Take 1 tablet (25 mg total) by mouth daily. 08/29/22   Geradine Girt, DO      Allergies    Codeine, Crestor [rosuvastatin calcium], Entresto [sacubitril-valsartan], Lisinopril, Pantoprazole sodium, Pravastatin, and Simvastatin    Review of Systems   Review of Systems  Physical Exam Updated Vital Signs BP (!) 177/76 (BP Location: Right Arm)   Pulse 73   Temp 99.1 F (37.3 C)   Resp 18   SpO2 95%  Physical Exam Vitals and nursing note reviewed.  Constitutional:      General: She is not in acute distress.    Appearance: Normal appearance. She is not ill-appearing.  HENT:     Head: Normocephalic and atraumatic.     Right Ear: Tympanic membrane normal.     Left Ear: Tympanic membrane normal.     Nose: Nose normal.     Mouth/Throat:     Mouth: Mucous membranes are moist.     Pharynx: Oropharynx is clear.  Eyes:     General: No scleral icterus.    Extraocular Movements: Extraocular movements intact.     Conjunctiva/sclera: Conjunctivae normal.     Pupils: Pupils are equal, round, and reactive to light.  Neck:     Comments: Range of motion not tested due to  patient's cervical spine injury Cardiovascular:     Rate and Rhythm: Normal rate and regular rhythm.  Pulmonary:     Effort: Pulmonary effort is normal. No respiratory distress.     Breath sounds: Normal breath sounds. No wheezing.  Musculoskeletal:        General: Tenderness (Right scapula) present. Normal range of motion.  Skin:    General: Skin is warm and dry.     Findings: No lesion or rash.  Neurological:     Mental Status: She is alert.     Comments: Cranial nerves II through XII grossly intact.  No facial droop or slurred speech.  No problem with EOMs.  Moving all extremities well with normal strength of bilateral upper and lower extremities.  Psychiatric:        Mood and Affect: Mood normal.     ED Results / Procedures / Treatments   Labs (all labs  ordered are listed, but only abnormal results are displayed) Labs Reviewed  COMPREHENSIVE METABOLIC PANEL - Abnormal; Notable for the following components:      Result Value   Glucose, Bld 121 (*)    All other components within normal limits  RESP PANEL BY RT-PCR (FLU A&B, COVID) ARPGX2  CBC WITH DIFFERENTIAL/PLATELET  LACTIC ACID, PLASMA  PROTIME-INR  URINALYSIS, ROUTINE W REFLEX MICROSCOPIC  SAMPLE TO BLOOD BANK  TROPONIN I (HIGH SENSITIVITY)  TROPONIN I (HIGH SENSITIVITY)    EKG None  Radiology DG Pelvis 1-2 Views  Result Date: 10/25/2022 CLINICAL DATA:  Fall and pain. EXAM: PELVIS - 1 VIEW COMPARISON:  07/07/2022 and prior studies FINDINGS: No acute fracture, subluxation or dislocation identified. No focal bony lesions are present. No interval change. IMPRESSION: No evidence of acute bony abnormality. Electronically Signed   By: Harmon Pier M.D.   On: 10/25/2022 21:18   DG Shoulder Right  Result Date: 10/25/2022 CLINICAL DATA:  RIGHT shoulder pain. EXAM: RIGHT SHOULDER - 2+ VIEW COMPARISON:  08/25/2022 chest radiograph and prior studies FINDINGS: There is no evidence of acute fracture or dislocation. Degenerative changes and loss of the subacromial space again noted. No focal bony lesions are present. IMPRESSION: 1. No evidence of acute bony abnormality. 2. Degenerative changes and loss of the subacromial space. Electronically Signed   By: Harmon Pier M.D.   On: 10/25/2022 21:16   DG Chest 2 View  Result Date: 10/25/2022 CLINICAL DATA:  Syncope. EXAM: CHEST - 2 VIEW COMPARISON:  08/29/2022 and prior studies FINDINGS: Cardiomegaly again noted. There is no evidence of focal airspace disease, pulmonary edema, suspicious pulmonary nodule/mass, pleural effusion, or pneumothorax. No acute bony abnormalities are identified. Remote LEFT rib fractures again noted. IMPRESSION: Cardiomegaly without acute cardiopulmonary disease. Electronically Signed   By: Harmon Pier M.D.   On: 10/25/2022  21:14   CT Head Wo Contrast  Result Date: 10/25/2022 CLINICAL DATA:  Trauma. EXAM: CT HEAD WITHOUT CONTRAST CT MAXILLOFACIAL WITHOUT CONTRAST CT CERVICAL SPINE WITHOUT CONTRAST TECHNIQUE: Multidetector CT imaging of the head, cervical spine, and maxillofacial structures were performed using the standard protocol without intravenous contrast. Multiplanar CT image reconstructions of the cervical spine and maxillofacial structures were also generated. RADIATION DOSE REDUCTION: This exam was performed according to the departmental dose-optimization program which includes automated exposure control, adjustment of the mA and/or kV according to patient size and/or use of iterative reconstruction technique. COMPARISON:  None Available. FINDINGS: CT HEAD FINDINGS Brain: Moderate age-related atrophy and chronic microvascular ischemic changes. There is no acute intracranial hemorrhage. No mass  effect or midline shift. No extra-axial fluid collection. Vascular: No hyperdense vessel or unexpected calcification. Skull: Normal. Negative for fracture or focal lesion. Other: None CT MAXILLOFACIAL FINDINGS Osseous: Depressed and comminuted fractures of left orbital floor with mild herniation of the orbital fat. There is abutment of the fracture to the inferior rectus muscle. Correlation with clinical exam recommended to exclude ocular entrapment. Mildly displaced fractures of the anterior and posterolateral walls of the left maxillary sinus. Fracture of the lateral wall of the left orbit. Orbits: The globes are unremarkable.  Left orbital emphysema. Sinuses: Air-fluid level within the left maxillary sinus, hemosinus. The remainder of the visualized paranasal sinuses and mastoid air cells are clear. Soft tissues: Left periorbital soft tissue swelling and emphysema. CT CERVICAL SPINE FINDINGS Alignment: No acute subluxation. Skull base and vertebrae: There is a nondisplaced fracture of the C6 lamina extending into the right  superior articular process and superior articular facet. There is probable extension of the fracture into the right C6 inferior articular process. There is fracture of the right C7 superior articular process. No definite vertebral body fractures. Soft tissues and spinal canal: Mild-to-moderate narrowing of the central canal due to degenerative changes. Disc levels:  Multilevel degenerative changes. Upper chest: Biapical subpleural scarring and reticular densities. Atypical infection is not excluded. Other: Bilateral carotid bulb calcified plaques. IMPRESSION: 1. No acute intracranial pathology. Moderate age-related atrophy and chronic microvascular ischemic changes. 2. Depressed and comminuted fractures of the left orbital floor with mild herniation of the orbital fat. There is abutment of the fracture to the inferior rectus muscle. Correlation with clinical exam recommended to exclude ocular entrapment. 3. Mildly displaced fractures of the anterior and posterolateral walls of the left maxillary sinus. 4. Nondisplaced fracture of the C6 lamina extending into the right superior articular process and superior articular facet. There is probable extension of the fracture into the right C6 inferior articular process. 5. Fracture of the right C7 superior articular process. These results were called by telephone at the time of interpretation on 10/25/2022 at 9:03 pm to provider [facility] , who verbally acknowledged these results. Electronically Signed   By: Anner Crete M.D.   On: 10/25/2022 21:05   CT Cervical Spine Wo Contrast  Result Date: 10/25/2022 CLINICAL DATA:  Trauma. EXAM: CT HEAD WITHOUT CONTRAST CT MAXILLOFACIAL WITHOUT CONTRAST CT CERVICAL SPINE WITHOUT CONTRAST TECHNIQUE: Multidetector CT imaging of the head, cervical spine, and maxillofacial structures were performed using the standard protocol without intravenous contrast. Multiplanar CT image reconstructions of the cervical spine and  maxillofacial structures were also generated. RADIATION DOSE REDUCTION: This exam was performed according to the departmental dose-optimization program which includes automated exposure control, adjustment of the mA and/or kV according to patient size and/or use of iterative reconstruction technique. COMPARISON:  None Available. FINDINGS: CT HEAD FINDINGS Brain: Moderate age-related atrophy and chronic microvascular ischemic changes. There is no acute intracranial hemorrhage. No mass effect or midline shift. No extra-axial fluid collection. Vascular: No hyperdense vessel or unexpected calcification. Skull: Normal. Negative for fracture or focal lesion. Other: None CT MAXILLOFACIAL FINDINGS Osseous: Depressed and comminuted fractures of left orbital floor with mild herniation of the orbital fat. There is abutment of the fracture to the inferior rectus muscle. Correlation with clinical exam recommended to exclude ocular entrapment. Mildly displaced fractures of the anterior and posterolateral walls of the left maxillary sinus. Fracture of the lateral wall of the left orbit. Orbits: The globes are unremarkable.  Left orbital emphysema. Sinuses: Air-fluid level within  the left maxillary sinus, hemosinus. The remainder of the visualized paranasal sinuses and mastoid air cells are clear. Soft tissues: Left periorbital soft tissue swelling and emphysema. CT CERVICAL SPINE FINDINGS Alignment: No acute subluxation. Skull base and vertebrae: There is a nondisplaced fracture of the C6 lamina extending into the right superior articular process and superior articular facet. There is probable extension of the fracture into the right C6 inferior articular process. There is fracture of the right C7 superior articular process. No definite vertebral body fractures. Soft tissues and spinal canal: Mild-to-moderate narrowing of the central canal due to degenerative changes. Disc levels:  Multilevel degenerative changes. Upper chest:  Biapical subpleural scarring and reticular densities. Atypical infection is not excluded. Other: Bilateral carotid bulb calcified plaques. IMPRESSION: 1. No acute intracranial pathology. Moderate age-related atrophy and chronic microvascular ischemic changes. 2. Depressed and comminuted fractures of the left orbital floor with mild herniation of the orbital fat. There is abutment of the fracture to the inferior rectus muscle. Correlation with clinical exam recommended to exclude ocular entrapment. 3. Mildly displaced fractures of the anterior and posterolateral walls of the left maxillary sinus. 4. Nondisplaced fracture of the C6 lamina extending into the right superior articular process and superior articular facet. There is probable extension of the fracture into the right C6 inferior articular process. 5. Fracture of the right C7 superior articular process. These results were called by telephone at the time of interpretation on 10/25/2022 at 9:03 pm to provider [facility] , who verbally acknowledged these results. Electronically Signed   By: Anner Crete M.D.   On: 10/25/2022 21:05   CT Maxillofacial WO CM  Result Date: 10/25/2022 CLINICAL DATA:  Trauma. EXAM: CT HEAD WITHOUT CONTRAST CT MAXILLOFACIAL WITHOUT CONTRAST CT CERVICAL SPINE WITHOUT CONTRAST TECHNIQUE: Multidetector CT imaging of the head, cervical spine, and maxillofacial structures were performed using the standard protocol without intravenous contrast. Multiplanar CT image reconstructions of the cervical spine and maxillofacial structures were also generated. RADIATION DOSE REDUCTION: This exam was performed according to the departmental dose-optimization program which includes automated exposure control, adjustment of the mA and/or kV according to patient size and/or use of iterative reconstruction technique. COMPARISON:  None Available. FINDINGS: CT HEAD FINDINGS Brain: Moderate age-related atrophy and chronic microvascular ischemic  changes. There is no acute intracranial hemorrhage. No mass effect or midline shift. No extra-axial fluid collection. Vascular: No hyperdense vessel or unexpected calcification. Skull: Normal. Negative for fracture or focal lesion. Other: None CT MAXILLOFACIAL FINDINGS Osseous: Depressed and comminuted fractures of left orbital floor with mild herniation of the orbital fat. There is abutment of the fracture to the inferior rectus muscle. Correlation with clinical exam recommended to exclude ocular entrapment. Mildly displaced fractures of the anterior and posterolateral walls of the left maxillary sinus. Fracture of the lateral wall of the left orbit. Orbits: The globes are unremarkable.  Left orbital emphysema. Sinuses: Air-fluid level within the left maxillary sinus, hemosinus. The remainder of the visualized paranasal sinuses and mastoid air cells are clear. Soft tissues: Left periorbital soft tissue swelling and emphysema. CT CERVICAL SPINE FINDINGS Alignment: No acute subluxation. Skull base and vertebrae: There is a nondisplaced fracture of the C6 lamina extending into the right superior articular process and superior articular facet. There is probable extension of the fracture into the right C6 inferior articular process. There is fracture of the right C7 superior articular process. No definite vertebral body fractures. Soft tissues and spinal canal: Mild-to-moderate narrowing of the central canal due to  degenerative changes. Disc levels:  Multilevel degenerative changes. Upper chest: Biapical subpleural scarring and reticular densities. Atypical infection is not excluded. Other: Bilateral carotid bulb calcified plaques. IMPRESSION: 1. No acute intracranial pathology. Moderate age-related atrophy and chronic microvascular ischemic changes. 2. Depressed and comminuted fractures of the left orbital floor with mild herniation of the orbital fat. There is abutment of the fracture to the inferior rectus muscle.  Correlation with clinical exam recommended to exclude ocular entrapment. 3. Mildly displaced fractures of the anterior and posterolateral walls of the left maxillary sinus. 4. Nondisplaced fracture of the C6 lamina extending into the right superior articular process and superior articular facet. There is probable extension of the fracture into the right C6 inferior articular process. 5. Fracture of the right C7 superior articular process. These results were called by telephone at the time of interpretation on 10/25/2022 at 9:03 pm to provider [facility] , who verbally acknowledged these results. Electronically Signed   By: Anner Crete M.D.   On: 10/25/2022 21:05    Procedures .Critical Care  Performed by: Rhae Hammock, PA-C Authorized by: Rhae Hammock, PA-C   Critical care provider statement:    Critical care time (minutes):  60   Critical care time was exclusive of:  Separately billable procedures and treating other patients   Critical care was necessary to treat or prevent imminent or life-threatening deterioration of the following conditions:  Trauma   Critical care was time spent personally by me on the following activities:  Development of treatment plan with patient or surrogate, discussions with consultants, discussions with primary provider, evaluation of patient's response to treatment, examination of patient, obtaining history from patient or surrogate, ordering and review of laboratory studies, ordering and review of radiographic studies, pulse oximetry, re-evaluation of patient's condition, review of old charts and ordering and performing treatments and interventions   I assumed direction of critical care for this patient from another provider in my specialty: no     Care discussed with: admitting provider      Medications Ordered in ED Medications  fentaNYL (SUBLIMAZE) injection 25 mcg (25 mcg Intravenous Given 10/25/22 2149)  morphine (PF) 2 MG/ML injection 2 mg (2  mg Intravenous Given 10/25/22 2337)    ED Course/ Medical Decision Making/ A&P Clinical Course as of 10/25/22 2331  Sun Oct 25, 2022  2117 I spoke with the patient and her family at bedside.  Her family says that they saw her earlier today and she was in her normal state of health.  Patient denies any recent illness such as URI or GI condition.  She says that she "blacked out."  She reports that when she came to she was completely at her baseline and oriented.  Family says that she is acting normally now. [MR]  2328 I had a long discussion at bedside with the patient's daughter and granddaughters.  We discussed admission versus discharge home due to patient being a DNR and not wanting any invasive interventions.  Through shared decision making patient will be admitted to the hospital.  ENT will see her during her hospitalization and neurosurgery will see her outpatient.  Patient continues to be oriented and does not seem to have any neurologic decline at this time.  Complaining of more pain so repeat meds were ordered. [MR]    Clinical Course User Index [MR] Callia Swim A, PA-C  Medical Decision Making Amount and/or Complexity of Data Reviewed Labs: ordered. Radiology: ordered.  Risk Prescription drug management. Decision regarding hospitalization.   88 year old female presenting after fall on thinners.  Differential for the fall includes but is not limited to arrhythmia, seizure, syncope, CVA, hypoglycemia, orthostasis, vasovagal, intoxication.  Specific concerns today include facial fractures, vertebral injury, ACS, dissection, and PE.   This is not an exhaustive differential.    Past Medical History / Co-morbidities / Social History: CHF, most recent EF 35%, paroxysmal A-fib on 2.5 of Eliquis twice daily   Additional history: Per chart review patient was admitted to the hospital in October due to acute on chronic heart failure.  While in the department  she had episodic A-fib for which she is on Eliquis.  Most recent visit with cardiology was 5 days ago.  Patient continues on 100 mg daily amiodarone for her paroxysmal A-fib.  On Eliquis due to her CHA2DS2-VASc of 6   Physical Exam: Pertinent physical exam findings include Tenderness to the right scapula Bruising to the left orbit No midline cervical spine tenderness No bruising or lacerations to patient's trunk or back. No deformities or tenderness to patient's extremities. Normal distal pulses  Lab Tests: I ordered, and personally interpreted labs.  The pertinent results include: Normal electrolytes Troponin 17   Imaging Studies: The following scans were ordered 1.  Head CT: No intracranial hemorrhage or abnormalities outside of patient's age-related atrophy 2. CT cervical spine:  Nondisplaced fracture of the C6 lamina extending into the right  superior articular process and superior articular facet. There is  probable extension of the fracture into the right C6 inferior  articular process.   Fracture of the right C7 superior articular process.   3. CT maxillofacial Depressed and comminuted fractures of the left orbital floor with  mild herniation of the orbital fat. There is abutment of the  fracture to the inferior rectus muscle. Correlation with clinical  exam recommended to exclude ocular entrapment.  Mildly displaced fractures of the anterior and posterolateral  walls of the left maxillary sinus.   4.  Right shoulder and pelvic x-rays within normal limits   Cardiac Monitoring:  The patient was maintained on a cardiac monitor.  I viewed and interpreted the cardiac monitored which showed an underlying rhythm of: Sinus   Medications: I ordered medication including fentanyl. Reevaluation of the patient after these medicines showed that the patient improved.   Consultations Obtained: I spoke with neurosurgery who said that the patient should stay in her c-collar for the  next 2 weeks and follow-up with neurosurgery at that time.  I spoke with Dr. Redmond Baseman with ENT who recommended follow-up in 5 days if patient is discharged home.  If she is admitted he will see her in the hospital.   MDM/Disposition: This is a 88 year old female who presented as a level 2 trauma after a syncopal episode and fall on Eliquis.  There were no obvious deformities present on physical exam but she did have impressive inferior orbital swelling.  CT maxillofacial showed depressed and comminuted fractures of the left orbital floor with some herniation of the orbital fat.  On patient's physical exam her EOMs are intact without pain, do not believe there is any ocular entrapment.  I spoke with ENT who says they will see the patient during her hospitalization.  Neurosurgery says no intervention is necessary and she should remain in c-collar at all times and follow-up with them in 2 weeks.  Due to patient's  syncope versus mechanical fall on thinners I believe she requires admission for observation and potential further workup of her syncope.  Family is agreeable with this.  Admit to ***  After patient's work-up today, I feel that *** .     I discussed this case with my attending physician Dr. Billy Fischer who cosigned this note including patient's presenting symptoms, physical exam, and planned diagnostics and interventions. Attending physician stated agreement with plan or made changes to plan which were implemented.     D Diagnoses Final diagnoses:  None    Rx / DC Orders ED Discharge Orders     None

## 2022-10-25 NOTE — ED Provider Triage Note (Addendum)
Emergency Medicine Provider Triage Evaluation Note  [name] , a 88 y.o. female  was evaluated in triage.  Patient has a history of coronary artery disease, congestive heart failure (EF 35%).  She complains of head injury, syncope.  Patient is anticoagulated.  She was brought in by EMS, sent to triage.  She reports blacking out after taking care of her chickens.  Does not report any prodrome, it just happened.  She is adamant she did not trip.  EMS was called for transport.  Patient sustained a contusion below her left eye.  She also complains of right shoulder pain.  Review of Systems  Positive: Facial contusion, shoulder pain, syncope Negative: Chest pain, shortness of breath  Physical Exam  BP (!) 177/76 (BP Location: Right Arm)   Pulse 73   Temp 99.1 F (37.3 C)   Resp 18   SpO2 95%  Gen:   Awake, no distress   Resp:  Normal effort  MSK:   Moves extremities without difficulty; mild contusion noted inferoorbital rim on the left Other:  Heart regular rhythm  Medical Decision Making  Medically screening exam initiated at 8:22 PM.  Appropriate orders placed.  Luretha Eberly Thul was informed that the remainder of the evaluation will be completed by another provider, this initial triage assessment does not replace that evaluation, and the importance of remaining in the ED until their evaluation is complete.  Pt stable, looks well. RN has called to expedite CT.   8:26 PM Pt to CT.  8:27 PM EKG shows left bundle branch block, unchanged.  9:07 PM Called by radiologist in regards to posterior element C-spine fractures, orbital and maxillary fractures.  Patient is being moved to a room in the back and next provider notified.   Renne Crigler, PA-C 10/25/22 2025    Renne Crigler, PA-C 10/25/22 2026    Renne Crigler, PA-C 10/25/22 2027    Renne Crigler, PA-C 10/25/22 2107

## 2022-10-25 NOTE — ED Notes (Signed)
C-collar placed on pt.

## 2022-10-26 DIAGNOSIS — S02401A Maxillary fracture, unspecified, initial encounter for closed fracture: Secondary | ICD-10-CM | POA: Diagnosis present

## 2022-10-26 DIAGNOSIS — S02401S Maxillary fracture, unspecified, sequela: Secondary | ICD-10-CM

## 2022-10-26 DIAGNOSIS — S0285XA Fracture of orbit, unspecified, initial encounter for closed fracture: Secondary | ICD-10-CM | POA: Diagnosis not present

## 2022-10-26 HISTORY — DX: Maxillary fracture, unspecified side, sequela: S02.401S

## 2022-10-26 LAB — BASIC METABOLIC PANEL
Anion gap: 9 (ref 5–15)
BUN: 12 mg/dL (ref 8–23)
CO2: 26 mmol/L (ref 22–32)
Calcium: 8.7 mg/dL — ABNORMAL LOW (ref 8.9–10.3)
Chloride: 100 mmol/L (ref 98–111)
Creatinine, Ser: 0.72 mg/dL (ref 0.44–1.00)
GFR, Estimated: >60 mL/min (ref >60)
Glucose, Bld: 130 mg/dL — ABNORMAL HIGH (ref 70–99)
Potassium: 3.8 mmol/L (ref 3.5–5.1)
Sodium: 135 mmol/L (ref 135–145)

## 2022-10-26 LAB — CBC
HCT: 37.1 % (ref 36.0–46.0)
Hemoglobin: 11.8 g/dL — ABNORMAL LOW (ref 12.0–15.0)
MCH: 30.9 pg (ref 26.0–34.0)
MCHC: 31.8 g/dL (ref 30.0–36.0)
MCV: 97.1 fL (ref 80.0–100.0)
Platelets: 169 10*3/uL (ref 150–400)
RBC: 3.82 MIL/uL — ABNORMAL LOW (ref 3.87–5.11)
RDW: 14.1 % (ref 11.5–15.5)
WBC: 7.9 10*3/uL (ref 4.0–10.5)
nRBC: 0 % (ref 0.0–0.2)

## 2022-10-26 LAB — MAGNESIUM: Magnesium: 1.7 mg/dL (ref 1.7–2.4)

## 2022-10-26 MED ORDER — ACETAMINOPHEN 650 MG RE SUPP: 650.0000 mg | Freq: Four times a day (QID) | RECTAL | Status: DC | PRN

## 2022-10-26 MED ORDER — FUROSEMIDE 20 MG PO TABS
20.0000 mg | ORAL_TABLET | ORAL | Status: DC
  Filled 2022-10-26: qty 1

## 2022-10-26 MED ORDER — ACETAMINOPHEN 325 MG PO TABS: 650.0000 mg | ORAL_TABLET | Freq: Four times a day (QID) | ORAL | Status: DC | PRN

## 2022-10-26 MED ORDER — SODIUM CHLORIDE 0.9% FLUSH
3.0000 mL | Freq: Two times a day (BID) | INTRAVENOUS | Status: DC
  Administered 2022-10-26 (×2): 3 mL via INTRAVENOUS

## 2022-10-26 MED ORDER — FENTANYL CITRATE PF 50 MCG/ML IJ SOSY: 12.5000 ug | PREFILLED_SYRINGE | INTRAMUSCULAR | Status: DC | PRN

## 2022-10-26 MED ORDER — POLYETHYLENE GLYCOL 3350 17 G PO PACK: 17.0000 g | PACK | Freq: Every day | ORAL | Status: DC | PRN

## 2022-10-26 MED ORDER — KETOROLAC TROMETHAMINE 15 MG/ML IJ SOLN
15.0000 mg | Freq: Four times a day (QID) | INTRAMUSCULAR | Status: DC
  Administered 2022-10-26 (×2): 15 mg via INTRAVENOUS
  Filled 2022-10-26 (×2): qty 1

## 2022-10-26 MED ORDER — SPIRONOLACTONE 25 MG PO TABS
25.0000 mg | ORAL_TABLET | Freq: Every day | ORAL | Status: DC
  Administered 2022-10-26: 25 mg via ORAL
  Filled 2022-10-26: qty 1

## 2022-10-26 MED ORDER — FUROSEMIDE 20 MG PO TABS: 20.0000 mg | ORAL_TABLET | ORAL | Status: DC

## 2022-10-26 MED ORDER — CARVEDILOL 3.125 MG PO TABS: 3.1250 mg | ORAL_TABLET | Freq: Two times a day (BID) | ORAL | Status: DC

## 2022-10-26 MED ORDER — ACETAMINOPHEN 500 MG PO TABS
1000.0000 mg | ORAL_TABLET | Freq: Four times a day (QID) | ORAL | Status: DC
  Administered 2022-10-26 (×2): 1000 mg via ORAL
  Filled 2022-10-26 (×2): qty 2

## 2022-10-26 MED ORDER — OXYCODONE HCL 5 MG PO TABS: 2.5000 mg | ORAL_TABLET | ORAL | Status: DC | PRN

## 2022-10-26 MED ORDER — AMIODARONE HCL 200 MG PO TABS
100.0000 mg | ORAL_TABLET | Freq: Every day | ORAL | Status: DC
  Administered 2022-10-26: 100 mg via ORAL
  Filled 2022-10-26: qty 1

## 2022-10-26 MED ORDER — ALBUTEROL SULFATE (2.5 MG/3ML) 0.083% IN NEBU: 2.5000 mg | INHALATION_SOLUTION | Freq: Four times a day (QID) | RESPIRATORY_TRACT | Status: DC | PRN

## 2022-10-26 MED ORDER — METHOCARBAMOL 500 MG PO TABS
1000.0000 mg | ORAL_TABLET | Freq: Three times a day (TID) | ORAL | Status: DC
  Administered 2022-10-26: 1000 mg via ORAL
  Filled 2022-10-26: qty 2

## 2022-10-26 NOTE — Progress Notes (Signed)
   10/25/22 2120  Clinical Encounter Type  Visited With Patient not available  Visit Type Initial;Trauma  Referral From Nurse  Consult/Referral To Chaplain   Chaplain responded to a level two trauma. The patient was under the care of the medical team.  If a chaplain is requested someone will respond.   [facility]  [phone]

## 2022-10-26 NOTE — ED Notes (Signed)
Replaced c-collar with a smaller c-collar for pt's size.

## 2022-10-26 NOTE — Consult Note (Signed)
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 dose-optimization program which includes automated exposure control, adjustment of the mA and/or kV according to patient size and/or use of iterative reconstruction technique. COMPARISON:  None Available. FINDINGS: CT HEAD FINDINGS Brain: Moderate age-related atrophy and chronic microvascular ischemic changes. There is no acute intracranial hemorrhage. No mass effect or midline shift. No extra-axial fluid collection. Vascular: No hyperdense vessel or unexpected calcification. Skull: Normal. Negative for fracture or focal lesion. Other: None CT MAXILLOFACIAL FINDINGS Osseous: Depressed and comminuted fractures of left orbital floor with mild herniation of the orbital fat. There is abutment of the fracture to the inferior rectus muscle. Correlation with clinical exam recommended to exclude ocular entrapment. Mildly displaced fractures of the  anterior  and posterolateral walls of the left maxillary sinus. Fracture of the lateral wall of the left orbit. Orbits: The globes are unremarkable.  Left orbital emphysema. Sinuses: Air-fluid level within the left maxillary sinus, hemosinus. The remainder of the visualized paranasal sinuses and mastoid air cells are clear. Soft tissues: Left periorbital soft tissue swelling and emphysema. CT CERVICAL SPINE FINDINGS Alignment: No acute subluxation. Skull base and vertebrae: There is a nondisplaced fracture of the C6 lamina extending into the right superior articular process and superior articular facet. There is probable extension of the fracture into the right C6 inferior articular process. There is fracture of the right C7 superior articular process. No definite vertebral body fractures. Soft tissues and spinal canal: Mild-to-moderate narrowing of the central canal due to degenerative changes. Disc levels:  Multilevel degenerative changes. Upper chest: Biapical subpleural scarring and reticular densities. Atypical infection is not excluded. Other: Bilateral carotid bulb calcified plaques. IMPRESSION: 1. No acute intracranial pathology. Moderate age-related atrophy and chronic microvascular ischemic changes. 2. Depressed and comminuted fractures of the left orbital floor with mild herniation of the orbital fat. There is abutment of the fracture to the inferior rectus muscle. Correlation with clinical exam recommended to exclude ocular entrapment. 3. Mildly displaced fractures of the anterior and posterolateral walls of the left maxillary sinus. 4. Nondisplaced fracture of the C6 lamina extending into the right superior articular process and superior articular facet. There is probable extension of the fracture into the right C6 inferior articular process. 5. Fracture of the right C7 superior articular process. These results were called by telephone at the time of interpretation on 10/25/2022 at 9:03 pm to provider [facility] , who verbally acknowledged these results. Electronically Signed   By: Elgie Collard M.D.   On: 10/25/2022 21:05    Review of Systems  HENT:  Positive for facial swelling.   Musculoskeletal:  Positive for arthralgias.  All other systems reviewed and are negative.  Blood pressure 137/69, pulse 63, temperature 98.3 F (36.8 C), temperature source Oral, resp. rate 12, SpO2 96 %. Physical Exam Constitutional:      Appearance: Normal appearance. She is normal weight.  HENT:     Head: Normocephalic.     Right Ear: External ear normal.     Left Ear: External ear normal.     Nose: Nose normal.     Mouth/Throat:     Mouth: Mucous membranes are moist.     Pharynx: Oropharynx is clear.  Eyes:     Extraocular Movements: Extraocular movements intact.     Conjunctiva/sclera: Conjunctivae normal.     Pupils: Pupils are equal, round, and reactive to light.     Comments: No orbital stepoff.  Some left periorbital ecchymosis and slight edema.  Neck:     Comments: Hard cervical collar Cardiovascular:     Rate and Rhythm: Normal rate.  Pulmonary:     Effort: Pulmonary effort is normal.  Skin:    General: Skin is warm and dry.  Neurological:     General: No focal deficit present.     Mental Status: She is alert and oriented to person, place, and time.  Psychiatric:        Mood and Affect: Mood normal.        Behavior: Behavior normal.        Thought Content: Thought content normal.        Judgment: Judgment normal.     Assessment/Plan: Left maxillary and orbital floor fractures  I personally reviewed her maxillofacial CT.  I discussed her injury with the patient and her family member.  The left maxillary fracture is not significantly displaced and does not impact on function or form.  The orbital floor fracture is somewhat displaced with some herniation of orbital contents.  There is no entrapment.  We will discuss options of observation versus surgical repair further as an  outpatient later this week.  Melida Quitter 10/26/2022, 12:22 PM

## 2022-10-26 NOTE — Discharge Summary (Signed)
Physician Discharge Summary   Patient: [name] MRN: [id number] DOB: [birth year]  Admit date:     10/25/2022  Discharge date: 10/26/22  Discharge Physician: Edwin Dada   PCP: [name], NP     Recommendations at discharge:  Follow up with ENT Dr. Redmond Baseman for orbital floor fracture Follow up with Neurosurgery Viona Gilmore for C6 laminar and articular process fractures Follow up with Cardiology for risk benefit discussion regarding Eliquis     Discharge Diagnoses: Principal Problem:   Orbital fracture, closed, initial encounter [facility]) Active Problems:   Closed C6 fracture ([place])   Maxillary sinus fracture, closed, initial encounter ([place])   Orthostatic hypotension   Hyperlipidemia   Essential hypertension   Chronic combined systolic and diastolic CHF (congestive heart failure) (HCC)   CAD (coronary artery disease)   PAF (paroxysmal atrial fibrillation) [facility])      Hospital Course: [name] is a 88 y.o. F with CAD s/p PCI >20yr, sdCHF EF 30-35%, chronic LBBB, asthma, HTN, HLD and orthostatic hypotension who presented with fall.  Patient was feeding chickens when she turned and fall, landing on her face.  Did feel woozy prior to fall, did not lose consciousness.  In the ER, found to have nondisplaced C6 vertebral lamina and C7 superior articular process fractures, orbital fractures, maxillary sinus fractures.  ECG with old LBBB, unchanged.  CBC and electrolytes and renal function normal.   Trauma, ENT, and Neurosurgery were consulted.   Orbital fracture Maxillary sinus fracture Patient was evaluated by ENT.  She had no vision changes.  He recommended follow-up in the office this week.   Nondisplaced C6 vertebral laminar fracture Nondisplaced C7 superior articular process fracture Case was discussed with neurosurgery who recommended hard collar and follow-up in the office.   Presyncope Chronic orthostasis Patient has chronic orthostasis.   This was observed here.  This is expected in an active 88 y.o. with reduced ejection fraction and LBBB.  She has no additional medical illness contributing to this now, and was in her normal health prior to her fall.  Compensatory strategies to prevent falls were recommended to patient.  She will be closely monitored by family in the short term, and afterwards, can be evaluated by her PCP for comrpession hose or abdominal binder.   Paroxysmal atrial fibrillation The patient has a CHA2DS2-Vasc 5.  Generally speaking, despite risks associated with systemic anticoagulation, its stroke risk reduction benefits outweigh its risks even in patients who fall.             The [facility] Controlled Substances Registry was reviewed for this patient prior to discharge.   Consultants:  Trauma ENT Neurosurgery  Procedures performed:  CT head, maxilla, c-spine, chest abdomen and pelvis   Disposition: Home health Diet recommendation:  Discharge Diet Orders (From admission, onward)     Start     Ordered   10/26/22 0000  Diet - low sodium heart healthy        10/26/22 1436             DISCHARGE MEDICATION: Allergies as of 10/26/2022       Reactions   Codeine Cough   Crestor [rosuvastatin Calcium] Cough   Entresto [sacubitril-valsartan]    cough   Lisinopril Cough   Pantoprazole Sodium Cough   Pravastatin Cough   Simvastatin Cough        Medication List     TAKE these medications    acetaminophen 325 MG tablet Commonly known as:  TYLENOL Take 2 tablets (650 mg total) by mouth every 6 (six) hours as needed for mild pain (or Fever >/= 101).   albuterol 108 (90 Base) MCG/ACT inhaler Commonly known as: VENTOLIN HFA Inhale 1-2 puffs into the lungs every 6 (six) hours as needed for wheezing or shortness of breath.   amiodarone 200 MG tablet Commonly known as: Pacerone Take 1/2 tablet by mouth daily What changed:  how much to take how to take this when to take  this   apixaban 2.5 MG Tabs tablet Commonly known as: ELIQUIS Take 1 tablet (2.5 mg total) by mouth 2 (two) times daily.   carvedilol 3.125 MG tablet Commonly known as: COREG Take 1 tablet (3.125 mg total) by mouth 2 (two) times daily with a meal.   clopidogrel 75 MG tablet Commonly known as: PLAVIX Take 75 mg by mouth daily.   ezetimibe 10 MG tablet Commonly known as: ZETIA TAKE 1 TABLET BY MOUTH  DAILY   furosemide 20 MG tablet Commonly known as: LASIX Take 1 tablet by mouth three times per week What changed:  how much to take how to take this when to take this additional instructions   PRESERVISION AREDS 2+MULTI VIT PO Take 1 capsule by mouth in the morning and at bedtime.   spironolactone 25 MG tablet Commonly known as: ALDACTONE Take 1 tablet (25 mg total) by mouth daily.        Follow-up Information     Patricia Nettle, NP. Schedule an appointment as soon as possible for a visit in 2 weeks.   Specialty: Neurosurgery Contact information: [address] [place] [address] [phone]         Melida Quitter, MD Follow up on 10/29/2022.   Specialty: Otolaryngology Contact information: [address] [place] [address] [phone]         Nahser, Wonda Cheng, MD Follow up.   Specialty: Cardiology Contact information: [place] [address] [address] [phone]                 Discharge Instructions     Diet - low sodium heart healthy   Complete by: As directed    Discharge instructions   Complete by: As directed    **IMPORTANT DISCHARGE INSTRUCTIONS**   From Dr. Loleta Books:  You were admitted for a fall Here, you were found to have an orbital floor fracture, a cervical spine laminar fracture and anterior process fracture in the C6 vertebra (both stable and not displaced)  Your medical work up was normal  As a consequence of your heart disease, you have had "orthostasis" for  years (this is the condition where your blood pressure drops with standing)  This is common, and there aren't reliable ways to correct it.  It is not medically dangerous except that it contributes to falls.  Since you will be closely monitored by family for the forseeable future, and you will be using a walker and taking it slow (waiting a minute after you stand before starting to walk), you will not be in danger  Once you have recovered from the current injuries, and have followed up with the ENT doctor about your eye and the Neurosurgeons about your spine, and are out of the collar and done with physical therapy, your pirimary care doctor might be able to discuss with you an abdominal binder or compression stockings to address the orthostasis (blood pressure drop with standing) that causes your  dizziness.  In the meantime though, they would be really uncomfortable, and not needed.    **IMPORTANT**  Call your heart doctor to ask if you should continue your apixaban given your falling.   Increase activity slowly   Complete by: As directed        Discharge Exam: There were no vitals filed for this visit.  General: Pt is alert, awake, not in acute distress Cardiovascular: RRR, nl S1-S2, no murmurs appreciated.   No LE edema.   Respiratory: Normal respiratory rate and rhythm.  CTAB without rales or wheezes. Abdominal: Abdomen soft and non-tender.  No distension or HSM.   Neuro/Psych: Strength symmetric in upper and lower extremities.  Judgment and insight appear normal.   Condition at discharge: good  The results of significant diagnostics from this hospitalization (including imaging, microbiology, ancillary and laboratory) are listed below for reference.   Imaging Studies: CT Chest Wo Contrast  Result Date: 10/26/2022 CLINICAL DATA:  Chest trauma with mid back pain, ribcage pain. EXAM: CT CHEST WITHOUT CONTRAST TECHNIQUE: Multidetector CT imaging of the chest was performed following  the standard protocol without IV contrast. RADIATION DOSE REDUCTION: This exam was performed according to the departmental dose-optimization program which includes automated exposure control, adjustment of the mA and/or kV according to patient size and/or use of iterative reconstruction technique. COMPARISON:  AP and lateral chest today, portable chest 08/29/2022, and CTA chest 08/26/2022. FINDINGS: Cardiovascular: There is mild cardiomegaly. Heavy three-vessel coronary artery calcifications are noted. The arch and descending aorta are heavily calcified with scattered calcification in the valve leaflets. There is no aortic aneurysm. There is ectasia in the ascending segment measuring 3.7 cm. The pulmonary arteries and veins are normal in caliber. There is calcific plaque in the great vessels, heaviest at the proximal left subclavian artery which is likely either occluded or severely stenotic based on prior CTA chest studies. There is additional heavy calcific plaque of the right subclavian artery origin as well. Mediastinum/Nodes: The thoracic trachea and thoracic esophagus are unremarkable apart from a small hiatal hernia. There is calcified precarinal mediastinal node. There are slightly prominent precarinal and subcarinal lymph nodes which are unchanged. No new intrathoracic or axillary adenopathy is seen. Thyroid gland and axillary spaces are unremarkable. Lungs/Pleura: The bilateral chronic subpleural reticulation. In portions of the upper lobes this is associated with a single up to 3 layer subpleural honeycombing. Small amount of paraspinal honeycombing noted in the medial lower lobe bases. Previously there were pleural effusions which are not seen today. There is no pleural effusion, thickening or pneumothorax. No focal pulmonary consolidation or mass. There is bronchial thickening in the lower lobes. Upper Abdomen: No acute noncontrast CT findings. Some respiratory motion on exam. The abdominal aorta and  visualized visceral branch arteries are heavily calcified especially the celiac trunk and proximal SMA. Musculoskeletal: There is no chest wall hematoma. The bones are diffusely osteopenic. There are chronic healed fracture deformities in the posterior left rib cage. No acute displaced rib fracture is seen. There is bilateral shoulder DJD, on the right with acromiohumeral abutment consistent with chronic rotator cuff arthropathy and degenerative tear. There is multilevel bridging enthesopathy of the thoracic spine of DISH. No acute spinal compression fracture is seen. IMPRESSION: 1. No acute trauma related findings in the chest. 2. Cardiomegaly with heavy calcific CAD. 3. Heavy aortic and great vessel atherosclerosis. 4. Chronic interstitial changes in the lungs. 5. Lower lobe bronchitis.  No active pneumonia. 6. Osteopenia and degenerative change. No acute spinal  compression fracture is seen. 7. Chronic healed fracture deformities of the posterior left rib cage. 8. 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 9. Small hiatal hernia. Electronically Signed   By: Telford Nab M.D.   On: 10/26/2022 00:47   CT Thoracic Spine Wo Contrast  Result Date: 10/26/2022 CLINICAL DATA:  Mid back pain EXAM: CT THORACIC SPINE WITHOUT CONTRAST TECHNIQUE: Multidetector CT images of the thoracic were obtained using the standard protocol without intravenous contrast. RADIATION DOSE REDUCTION: This exam was performed according to the departmental dose-optimization program which includes automated exposure control, adjustment of the mA and/or kV according to patient size and/or use of iterative reconstruction technique. COMPARISON:  None Available. FINDINGS: Alignment: Grade 1 retrolisthesis at T12-L1 Vertebrae: No acute fracture or focal pathologic process. Disc levels: No spinal canal stenosis. IMPRESSION: 1. No acute fracture of the  thoracic spine. 2. Grade 1 retrolisthesis at T12-L1. Electronically Signed   By: Ulyses Jarred M.D.   On: 10/26/2022 00:21   DG Pelvis 1-2 Views  Result Date: 10/25/2022 CLINICAL DATA:  Fall and pain. EXAM: PELVIS - 1 VIEW COMPARISON:  07/07/2022 and prior studies FINDINGS: No acute fracture, subluxation or dislocation identified. No focal bony lesions are present. No interval change. IMPRESSION: No evidence of acute bony abnormality. Electronically Signed   By: Margarette Canada M.D.   On: 10/25/2022 21:18   DG Shoulder Right  Result Date: 10/25/2022 CLINICAL DATA:  RIGHT shoulder pain. EXAM: RIGHT SHOULDER - 2+ VIEW COMPARISON:  08/25/2022 chest radiograph and prior studies FINDINGS: There is no evidence of acute fracture or dislocation. Degenerative changes and loss of the subacromial space again noted. No focal bony lesions are present. IMPRESSION: 1. No evidence of acute bony abnormality. 2. Degenerative changes and loss of the subacromial space. Electronically Signed   By: Margarette Canada M.D.   On: 10/25/2022 21:16   DG Chest 2 View  Result Date: 10/25/2022 CLINICAL DATA:  Syncope. EXAM: CHEST - 2 VIEW COMPARISON:  08/29/2022 and prior studies FINDINGS: Cardiomegaly again noted. There is no evidence of focal airspace disease, pulmonary edema, suspicious pulmonary nodule/mass, pleural effusion, or pneumothorax. No acute bony abnormalities are identified. Remote LEFT rib fractures again noted. IMPRESSION: Cardiomegaly without acute cardiopulmonary disease. Electronically Signed   By: Margarette Canada M.D.   On: 10/25/2022 21:14   CT Head Wo Contrast  Result Date: 10/25/2022 CLINICAL DATA:  Trauma. EXAM: CT HEAD WITHOUT CONTRAST CT MAXILLOFACIAL WITHOUT CONTRAST CT CERVICAL SPINE WITHOUT CONTRAST TECHNIQUE: Multidetector CT imaging of the head, cervical spine, and maxillofacial structures were performed using the standard protocol without intravenous contrast. Multiplanar CT image reconstructions of the  cervical spine and maxillofacial structures were also generated. RADIATION DOSE REDUCTION: This exam was performed according to the departmental dose-optimization program which includes automated exposure control, adjustment of the mA and/or kV according to patient size and/or use of iterative reconstruction technique. COMPARISON:  None Available. FINDINGS: CT HEAD FINDINGS Brain: Moderate age-related atrophy and chronic microvascular ischemic changes. There is no acute intracranial hemorrhage. No mass effect or midline shift. No extra-axial fluid collection. Vascular: No hyperdense vessel or unexpected calcification. Skull: Normal. Negative for fracture or focal lesion. Other: None CT MAXILLOFACIAL FINDINGS Osseous: Depressed and comminuted fractures of left orbital floor with mild herniation of the orbital fat. There is abutment of the fracture to the inferior rectus muscle. Correlation with clinical exam recommended to exclude ocular entrapment. Mildly displaced  fractures of the anterior and posterolateral walls of the left maxillary sinus. Fracture of the lateral wall of the left orbit. Orbits: The globes are unremarkable.  Left orbital emphysema. Sinuses: Air-fluid level within the left maxillary sinus, hemosinus. The remainder of the visualized paranasal sinuses and mastoid air cells are clear. Soft tissues: Left periorbital soft tissue swelling and emphysema. CT CERVICAL SPINE FINDINGS Alignment: No acute subluxation. Skull base and vertebrae: There is a nondisplaced fracture of the C6 lamina extending into the right superior articular process and superior articular facet. There is probable extension of the fracture into the right C6 inferior articular process. There is fracture of the right C7 superior articular process. No definite vertebral body fractures. Soft tissues and spinal canal: Mild-to-moderate narrowing of the central canal due to degenerative changes. Disc levels:  Multilevel degenerative  changes. Upper chest: Biapical subpleural scarring and reticular densities. Atypical infection is not excluded. Other: Bilateral carotid bulb calcified plaques. IMPRESSION: 1. No acute intracranial pathology. Moderate age-related atrophy and chronic microvascular ischemic changes. 2. Depressed and comminuted fractures of the left orbital floor with mild herniation of the orbital fat. There is abutment of the fracture to the inferior rectus muscle. Correlation with clinical exam recommended to exclude ocular entrapment. 3. Mildly displaced fractures of the anterior and posterolateral walls of the left maxillary sinus. 4. Nondisplaced fracture of the C6 lamina extending into the right superior articular process and superior articular facet. There is probable extension of the fracture into the right C6 inferior articular process. 5. Fracture of the right C7 superior articular process. These results were called by telephone at the time of interpretation on 10/25/2022 at 9:03 pm to provider [facility] , who verbally acknowledged these results. Electronically Signed   By: Anner Crete M.D.   On: 10/25/2022 21:05   CT Cervical Spine Wo Contrast  Result Date: 10/25/2022 CLINICAL DATA:  Trauma. EXAM: CT HEAD WITHOUT CONTRAST CT MAXILLOFACIAL WITHOUT CONTRAST CT CERVICAL SPINE WITHOUT CONTRAST TECHNIQUE: Multidetector CT imaging of the head, cervical spine, and maxillofacial structures were performed using the standard protocol without intravenous contrast. Multiplanar CT image reconstructions of the cervical spine and maxillofacial structures were also generated. RADIATION DOSE REDUCTION: This exam was performed according to the departmental dose-optimization program which includes automated exposure control, adjustment of the mA and/or kV according to patient size and/or use of iterative reconstruction technique. COMPARISON:  None Available. FINDINGS: CT HEAD FINDINGS Brain: Moderate age-related atrophy and  chronic microvascular ischemic changes. There is no acute intracranial hemorrhage. No mass effect or midline shift. No extra-axial fluid collection. Vascular: No hyperdense vessel or unexpected calcification. Skull: Normal. Negative for fracture or focal lesion. Other: None CT MAXILLOFACIAL FINDINGS Osseous: Depressed and comminuted fractures of left orbital floor with mild herniation of the orbital fat. There is abutment of the fracture to the inferior rectus muscle. Correlation with clinical exam recommended to exclude ocular entrapment. Mildly displaced fractures of the anterior and posterolateral walls of the left maxillary sinus. Fracture of the lateral wall of the left orbit. Orbits: The globes are unremarkable.  Left orbital emphysema. Sinuses: Air-fluid level within the left maxillary sinus, hemosinus. The remainder of the visualized paranasal sinuses and mastoid air cells are clear. Soft tissues: Left periorbital soft tissue swelling and emphysema. CT CERVICAL SPINE FINDINGS Alignment: No acute subluxation. Skull base and vertebrae: There is a nondisplaced fracture of the C6 lamina extending into the right superior articular process and superior articular facet. There is probable extension of the fracture  into the right C6 inferior articular process. There is fracture of the right C7 superior articular process. No definite vertebral body fractures. Soft tissues and spinal canal: Mild-to-moderate narrowing of the central canal due to degenerative changes. Disc levels:  Multilevel degenerative changes. Upper chest: Biapical subpleural scarring and reticular densities. Atypical infection is not excluded. Other: Bilateral carotid bulb calcified plaques. IMPRESSION: 1. No acute intracranial pathology. Moderate age-related atrophy and chronic microvascular ischemic changes. 2. Depressed and comminuted fractures of the left orbital floor with mild herniation of the orbital fat. There is abutment of the fracture to  the inferior rectus muscle. Correlation with clinical exam recommended to exclude ocular entrapment. 3. Mildly displaced fractures of the anterior and posterolateral walls of the left maxillary sinus. 4. Nondisplaced fracture of the C6 lamina extending into the right superior articular process and superior articular facet. There is probable extension of the fracture into the right C6 inferior articular process. 5. Fracture of the right C7 superior articular process. These results were called by telephone at the time of interpretation on 10/25/2022 at 9:03 pm to provider [facility] , who verbally acknowledged these results. Electronically Signed   By: Anner Crete M.D.   On: 10/25/2022 21:05   CT Maxillofacial WO CM  Result Date: 10/25/2022 CLINICAL DATA:  Trauma. EXAM: CT HEAD WITHOUT CONTRAST CT MAXILLOFACIAL WITHOUT CONTRAST CT CERVICAL SPINE WITHOUT CONTRAST TECHNIQUE: Multidetector CT imaging of the head, cervical spine, and maxillofacial structures were performed using the standard protocol without intravenous contrast. Multiplanar CT image reconstructions of the cervical spine and maxillofacial structures were also generated. RADIATION DOSE REDUCTION: This exam was performed according to the departmental dose-optimization program which includes automated exposure control, adjustment of the mA and/or kV according to patient size and/or use of iterative reconstruction technique. COMPARISON:  None Available. FINDINGS: CT HEAD FINDINGS Brain: Moderate age-related atrophy and chronic microvascular ischemic changes. There is no acute intracranial hemorrhage. No mass effect or midline shift. No extra-axial fluid collection. Vascular: No hyperdense vessel or unexpected calcification. Skull: Normal. Negative for fracture or focal lesion. Other: None CT MAXILLOFACIAL FINDINGS Osseous: Depressed and comminuted fractures of left orbital floor with mild herniation of the orbital fat. There is abutment of the  fracture to the inferior rectus muscle. Correlation with clinical exam recommended to exclude ocular entrapment. Mildly displaced fractures of the anterior and posterolateral walls of the left maxillary sinus. Fracture of the lateral wall of the left orbit. Orbits: The globes are unremarkable.  Left orbital emphysema. Sinuses: Air-fluid level within the left maxillary sinus, hemosinus. The remainder of the visualized paranasal sinuses and mastoid air cells are clear. Soft tissues: Left periorbital soft tissue swelling and emphysema. CT CERVICAL SPINE FINDINGS Alignment: No acute subluxation. Skull base and vertebrae: There is a nondisplaced fracture of the C6 lamina extending into the right superior articular process and superior articular facet. There is probable extension of the fracture into the right C6 inferior articular process. There is fracture of the right C7 superior articular process. No definite vertebral body fractures. Soft tissues and spinal canal: Mild-to-moderate narrowing of the central canal due to degenerative changes. Disc levels:  Multilevel degenerative changes. Upper chest: Biapical subpleural scarring and reticular densities. Atypical infection is not excluded. Other: Bilateral carotid bulb calcified plaques. IMPRESSION: 1. No acute intracranial pathology. Moderate age-related atrophy and chronic microvascular ischemic changes. 2. Depressed and comminuted fractures of the left orbital floor with mild herniation of the orbital fat. There is abutment of the fracture to the  inferior rectus muscle. Correlation with clinical exam recommended to exclude ocular entrapment. 3. Mildly displaced fractures of the anterior and posterolateral walls of the left maxillary sinus. 4. Nondisplaced fracture of the C6 lamina extending into the right superior articular process and superior articular facet. There is probable extension of the fracture into the right C6 inferior articular process. 5. Fracture of  the right C7 superior articular process. These results were called by telephone at the time of interpretation on 10/25/2022 at 9:03 pm to provider [facility] , who verbally acknowledged these results. Electronically Signed   By: Anner Crete M.D.   On: 10/25/2022 21:05    Microbiology: Results for orders placed or performed during the hospital encounter of 10/25/22  Resp Panel by RT-PCR (Flu A&B, Covid) Anterior Nasal Swab     Status: None   Collection Time: 10/25/22  9:45 PM   Specimen: Anterior Nasal Swab  Result Value Ref Range Status   SARS Coronavirus 2 by RT PCR NEGATIVE NEGATIVE Final    Comment: (NOTE) SARS-CoV-2 target nucleic acids are NOT DETECTED.  The SARS-CoV-2 RNA is generally detectable in upper respiratory specimens during the acute phase of infection. The lowest concentration of SARS-CoV-2 viral copies this assay can detect is 138 copies/mL. A negative result does not preclude SARS-Cov-2 infection and should not be used as the sole basis for treatment or other patient management decisions. A negative result may occur with  improper specimen collection/handling, submission of specimen other than nasopharyngeal swab, presence of viral mutation(s) within the areas targeted by this assay, and inadequate number of viral copies(<138 copies/mL). A negative result must be combined with clinical observations, patient history, and epidemiological information. The expected result is Negative.  Fact Sheet for Patients:  EntrepreneurPulse.com.au  Fact Sheet for Healthcare Providers:  IncredibleEmployment.be  This test is no t yet approved or cleared by the [place] FDA and  has been authorized for detection and/or diagnosis of SARS-CoV-2 by FDA under an Emergency Use Authorization (EUA). This EUA will remain  in effect (meaning this test can be used) for the duration of the COVID-19 declaration under Section 564(b)(1) of the Act,  21 U.S.C.section 360bbb-3(b)(1), unless the authorization is terminated  or revoked sooner.       Influenza A by PCR NEGATIVE NEGATIVE Final   Influenza B by PCR NEGATIVE NEGATIVE Final    Comment: (NOTE) The Xpert Xpress SARS-CoV-2/FLU/RSV plus assay is intended as an aid in the diagnosis of influenza from Nasopharyngeal swab specimens and should not be used as a sole basis for treatment. Nasal washings and aspirates are unacceptable for Xpert Xpress SARS-CoV-2/FLU/RSV testing.  Fact Sheet for Patients: EntrepreneurPulse.com.au  Fact Sheet for Healthcare Providers: IncredibleEmployment.be  This test is not yet approved or cleared by the [place] FDA and has been authorized for detection and/or diagnosis of SARS-CoV-2 by FDA under an Emergency Use Authorization (EUA). This EUA will remain in effect (meaning this test can be used) for the duration of the COVID-19 declaration under Section 564(b)(1) of the Act, 21 U.S.C. section 360bbb-3(b)(1), unless the authorization is terminated or revoked.  Performed at [facility], [place] [address] [place], [place] [address]     Labs: CBC: Recent Labs  Lab 10/25/22 2046 10/26/22 0050  WBC 8.7 7.9  NEUTROABS 6.2  --   HGB 13.2 11.8*  HCT 40.3 37.1  MCV 96.4 97.1  PLT 193 [id number]   Basic Metabolic Panel: Recent Labs  Lab 10/20/22 1159 10/25/22 2046 10/26/22  0050  NA 138 136 135  K 4.2 3.8 3.8  CL 97 101 100  CO2 29 26 26   GLUCOSE 96 121* 130*  BUN 14 14 12   CREATININE 0.84 0.78 0.72  CALCIUM 9.8 9.0 8.7*  MG  --   --  1.7   Liver Function Tests: Recent Labs  Lab 10/25/22 2046  AST 25  ALT 12  ALKPHOS 57  BILITOT 0.6  PROT 6.8  ALBUMIN 3.6   CBG: No results for input(s): "GLUCAP" in the last 168 hours.  Discharge time spent: approximately 35 minutes spent on discharge counseling, evaluation of patient on day of discharge, and coordination of discharge planning with  nursing, social work, pharmacy and case management  Signed: Edwin Dada, MD [facility] 10/26/2022

## 2022-10-26 NOTE — ED Notes (Signed)
Pt walking with PT/OT.  

## 2022-10-26 NOTE — ED Notes (Signed)
Family member leaving for the day. Family member states a bruise has shown up on left side of mouth. States she wants another ct scan of the head to make sure there is not another bleed. Coralee North RN made aware.

## 2022-10-26 NOTE — ED Notes (Signed)
C-collar readjusted since it has moved up around chin and mouth.

## 2022-10-26 NOTE — H&P (Addendum)
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 mildly displaced fractures of walls of left maxillary sinus noted on CT in ED  - Continue pain-control, follow-up ENT recommendations   2. C6 and C7 fractures  - Neurosurgery recommends hard cervical collar at all times and follow-up in 2 weeks  - Continue cervical collar, pain-control   3. Chronic combined systolic & diastolic CHF  - Appears compensated   - Continue Lasix, Aldactone, Coreg   4. PAF  - In sinus rhythm on admission  - Continue amiodarone and Coreg, hold Eliquis pending ENT consultation    5. CAD  - No anginal complaints   - Hold Plavix pending ENT consult     DVT prophylaxis: Eliquis pta, SCDs for now pending ENT consult Code Status: DNR  Level of Care: Level of care: Telemetry Medical Family Communication: granddaughter at bedside   Disposition Plan:  Patient is from: Home  Anticipated d/c is to: Home  Anticipated d/c date is: 12/4 or  10/27/22 Patient currently: Pending ENT consultation, pain-control  Consults called: NSG, ENT, and trauma consulted by ED PA Admission status: Observation     Vianne Bulls, MD [facility]  10/26/2022, 12:03 AM

## 2022-10-26 NOTE — Evaluation (Signed)
Occupational Therapy Evaluation Patient Details Name: NEZZIE MANERA MRN: [id number] DOB: 24-Mar-1928 Today's Date: 10/26/2022   History of Present Illness Pt is a 88 y/o female who presented after a fall at home. CT head/neck showed fx of orbital floor, mildly displaced fx of maxillary sinus, nondisplaced fx of C6 lamina and fx of C7 articular process. PMH: CHF, CAD, GERD, a fib, HTN, osteopenia   Clinical Impression   PTA, pt lives with granddaughter and her family who can provide 24/7 support. Pt reports recently using RW (with reminders from family), Modified Independent with ADLs and assists with basic household tasks. Pt has had recent falls at home - unsure what causes each fall. Pt presents now fairly close to reported baseline with Setup Assist for UB ADLs and min guard for LB ADLs. Pt did require intermittent Min A for hallway mobility using RW due to poor object avoidance on R side and assist to control the DME - no overt LOB. Provided education re: cervical precautions and fall prevention strategies (handouts provided for each). Pt and family not interested in SNF rehab at this time and reports HH therapy only came for one initial assessment before signing off. Would recommend HH therapy follow up again to maximize safety with ADL/IADL routine.    BP supine: 149/74 BP sitting: 137/85 BP standing: 113/74 ("a little" dizzy) BP after standing > 3 min: 131/68     Recommendations for follow up therapy are one component of a multi-disciplinary discharge planning process, led by the attending physician.  Recommendations may be updated based on patient status, additional functional criteria and insurance authorization.   Follow Up Recommendations  Home health OT     Assistance Recommended at Discharge Frequent or constant Supervision/Assistance  Patient can return home with the following A little help with walking and/or transfers;A little help with bathing/dressing/bathroom;Assistance  with cooking/housework;Direct supervision/assist for medications management;Assist for transportation    Functional Status Assessment  Patient has had a recent decline in their functional status and demonstrates the ability to make significant improvements in function in a reasonable and predictable amount of time.  Equipment Recommendations  None recommended by OT    Recommendations for Other Services       Precautions / Restrictions Precautions Precautions: Fall;Cervical Precaution Booklet Issued: Yes (comment) Required Braces or Orthoses: Cervical Brace Cervical Brace: Hard collar;At all times (per initial hospitalist note regarding neurosx recs) Restrictions Weight Bearing Restrictions: No      Mobility Bed Mobility Overal bed mobility: Needs Assistance Bed Mobility: Supine to Sit, Sit to Supine     Supine to sit: Min assist, HOB elevated Sit to supine: Min assist   General bed mobility comments: assist to lift trunk, cues to keep spine straight and Min A to get BLE up into bed    Transfers Overall transfer level: Needs assistance Equipment used: Rolling walker (2 wheels) Transfers: Sit to/from Stand Sit to Stand: Min guard           General transfer comment: min guard for safey from high stretcher      Balance Overall balance assessment: Needs assistance, History of Falls Sitting-balance support: No upper extremity supported, Feet supported Sitting balance-Leahy Scale: Fair     Standing balance support: Bilateral upper extremity supported, During functional activity Standing balance-Leahy Scale: Poor                             ADL either performed or assessed with  clinical judgement   ADL Overall ADL's : Needs assistance/impaired Eating/Feeding: Independent;Sitting Eating/Feeding Details (indicate cue type and reason): some difficulty with cervical collar, attempted to readjust pediatric sized collar though velcro straps appearing too  short Grooming: Min guard;Standing   Upper Body Bathing: Set up;Sitting   Lower Body Bathing: Min guard;Sit to/from stand   Upper Body Dressing : Set up;Sitting   Lower Body Dressing: Min guard;Sit to/from stand   Toilet Transfer: Minimal assistance;Ambulation;Rolling walker (2 wheels)   Toileting- Clothing Manipulation and Hygiene: Min guard;Sitting/lateral lean;Sit to/from stand       Functional mobility during ADLs: Minimal assistance;Rolling walker (2 wheels) General ADL Comments: Pt requiring assist to manuever with RW out in hallway, close to bumping items on R side. Educated on cervical precautions though no orders noted for collar wearing time in chart at this time. Discussed having family hands on with mobility initially, showering tasks , etc to maximize safety though unclear if family can provide that despite granddaughter being home 24/7     Vision Baseline Vision/History: 1 Wears glasses (reading) Ability to See in Adequate Light: 1 Impaired Patient Visual Report: Other (comment) (pt denies any changes though question this due to presentation w/ RW manueverability) Vision Assessment?: Vision impaired- to be further tested in functional context Additional Comments: question some R visual deficits vs cervical collar hindrance due to pt getting too close to objects on R side while mobilizing with RW     Perception     Praxis      Pertinent Vitals/Pain Pain Assessment Pain Assessment: No/denies pain     Hand Dominance Right   Extremity/Trunk Assessment Upper Extremity Assessment Upper Extremity Assessment: Generalized weakness   Lower Extremity Assessment Lower Extremity Assessment: Defer to PT evaluation   Cervical / Trunk Assessment Cervical / Trunk Assessment: Other exceptions Cervical / Trunk Exceptions: C6-7 fx in hard collar (pediatric size)   Communication Communication Communication: No difficulties   Cognition Arousal/Alertness:  Awake/alert Behavior During Therapy: WFL for tasks assessed/performed Overall Cognitive Status: Impaired/Different from baseline Area of Impairment: Memory, Safety/judgement                     Memory: Decreased short-term memory, Decreased recall of precautions   Safety/Judgement: Decreased awareness of safety, Decreased awareness of deficits     General Comments: pt daughter correcting pt during timeline events, PLOF and use of devices during session, reports pt with memory deficits     General Comments  Daughter present    Exercises     Shoulder Instructions      Home Living Family/patient expects to be discharged to:: Private residence Living Arrangements: Other relatives (granddaughter, her husband and their 73 year old) Available Help at Discharge: Family;Available 24 hours/day Type of Home: House Home Access: Stairs to enter [employer] of Steps: 1 Entrance Stairs-Rails: None Home Layout: Laundry or work area in basement;Able to live on main level with bedroom/bathroom;Two level     Bathroom Shower/Tub: Producer, television/film/video: Standard     Home Equipment: Agricultural consultant (2 wheels);Cane - quad;Shower seat;Other (comment);BSC/3in1 (walking stick)          Prior Functioning/Environment Prior Level of Function : Independent/Modified Independent             Mobility Comments: uses RW in the home per family request since recent admission. pt may be having more falls than reported to family due to pt ability to get off of floor without help at times  ADLs Comments: MOD I with ADLs, uses shower chair. washes dishes, cares for her chicken. granddaughter assists with meds. pt does not drive since memory deficits and incident of getting lost in the past        OT Problem List: Decreased strength;Decreased activity tolerance;Impaired balance (sitting and/or standing);Decreased safety awareness;Decreased cognition;Decreased knowledge of use  of DME or AE;Decreased knowledge of precautions      OT Treatment/Interventions: Self-care/ADL training;Therapeutic exercise;DME and/or AE instruction;Energy conservation;Therapeutic activities    OT Goals(Current goals can be found in the care plan section) Acute Rehab OT Goals Patient Stated Goal: pt would like to be able to take collar off, go home OT Goal Formulation: With patient/family Time For Goal Achievement: 11/09/22 Potential to Achieve Goals: Good ADL Goals Pt Will Perform Lower Body Bathing: with modified independence;sit to/from stand Pt Will Perform Lower Body Dressing: with modified independence;sitting/lateral leans;sit to/from stand Pt Will Transfer to Toilet: with modified independence;ambulating Additional ADL Goal #1: Pt to verbalize at least 3 fall prevention strategies to implement at home.  OT Frequency: Min 2X/week    Co-evaluation              AM-PAC OT "6 Clicks" Daily Activity     Outcome Measure Help from another person eating meals?: None Help from another person taking care of personal grooming?: A Little Help from another person toileting, which includes using toliet, bedpan, or urinal?: A Little Help from another person bathing (including washing, rinsing, drying)?: A Little Help from another person to put on and taking off regular upper body clothing?: A Little Help from another person to put on and taking off regular lower body clothing?: A Little 6 Click Score: 19   End of Session Equipment Utilized During Treatment: Gait belt;Rolling walker (2 wheels);Cervical collar Nurse Communication: Mobility status;Other (comment) (BP)  Activity Tolerance: Patient tolerated treatment well Patient left: in bed;with call bell/phone within reach;with family/visitor present  OT Visit Diagnosis: Other abnormalities of gait and mobility (R26.89);History of falling (Z91.81);Repeated falls (R29.6)                Time: 9163-8466 OT Time Calculation (min): 35  min Charges:  OT General Charges $OT Visit: 1 Visit OT Evaluation $OT Eval Low Complexity: 1 Low OT Treatments $Self Care/Home Management : 8-22 mins  Bradd Canary, OTR/L Acute Rehab Services Office: [phone]   Lorre Munroe 10/26/2022, 1:21 PM
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
## 2022-10-26 NOTE — ED Notes (Addendum)
Pt ambulating with physical therapy 

## 2022-10-26 NOTE — Hospital Course (Addendum)
Mrs. Mcenroe is a 88 y.o. F with CAD s/p PCI >3yr, sdCHF EF 30-35%, chronic LBBB, asthma, HTN, HLD and orthostatic hypotension who presented with fall.  Patient was feeding chickens when she turned and fall, landing on her face.  Did feel woozy prior to fall, did not lose consciousness.  In the ER, found to have nondisplaced C6 vertebral lamina and C7 superior articular process fractures, orbital fractures, maxillary sinus fractures.  ECG with old LBBB, unchanged.  CBC and electrolytes and renal function normal.   Trauma, ENT, and Neurosurgery were consulted.

## 2022-10-26 NOTE — Evaluation (Signed)
Physical Therapy Evaluation Patient Details Name: [name] MRN: [id number] DOB: 1928-10-03 Today's Date: 10/26/2022  History of Present Illness  Pt is a 88 y/o female who presented after a fall at home. CT head/neck showed fx of orbital floor, mildly displaced fx of maxillary sinus, nondisplaced fx of C6 lamina and fx of C7 articular process. PMH: CHF, CAD, GERD, a fib, HTN, osteopenia  Clinical Impression   Pt presents with generalized weakness, impaired balance with recent history of falls, impaired safety awareness and adherence to precautions, and decreased activity tolerance. Pt to benefit from acute PT to address deficits. Pt ambulated hallway distance with RW and close guard for safety, occasionally requires steadying and RW management assist. Per pt and pt's daughter, pt will have assist from granddaughter at d/c. PT has concerns about pt safety especially with use of hard collar, as when PT was adjusting fit with pt in supine pt states "I don't see a point (in adjusting it), I am leaving today". PT reiterated the importance of wearing collar at all times given fxs, pt and family express understanding. PT to progress mobility as tolerated, and will continue to follow acutely.         Recommendations for follow up therapy are one component of a multi-disciplinary discharge planning process, led by the attending physician.  Recommendations may be updated based on patient status, additional functional criteria and insurance authorization.  Follow Up Recommendations Home health PT      Assistance Recommended at Discharge Frequent or constant Supervision/Assistance  Patient can return home with the following  A little help with walking and/or transfers;A little help with bathing/dressing/bathroom    Equipment Recommendations None recommended by PT  Recommendations for Other Services       Functional Status Assessment Patient has had a recent decline in their functional status  and demonstrates the ability to make significant improvements in function in a reasonable and predictable amount of time.     Precautions / Restrictions Precautions Precautions: Fall;Cervical Precaution Booklet Issued: Yes (comment) Required Braces or Orthoses: Cervical Brace Cervical Brace: Hard collar;At all times (per initial hospitalist note regarding neurosx recs) Restrictions Weight Bearing Restrictions: No      Mobility  Bed Mobility Overal bed mobility: Needs Assistance Bed Mobility: Supine to Sit, Sit to Supine     Supine to sit: Supervision Sit to supine: Supervision        Transfers Overall transfer level: Needs assistance Equipment used: Rolling walker (2 wheels) Transfers: Sit to/from Stand Sit to Stand: Supervision           General transfer comment: for safety    Ambulation/Gait Ambulation/Gait assistance: Min guard, Min assist Gait Distance (Feet): 100 Feet Assistive device: Rolling walker (2 wheels) Gait Pattern/deviations: Step-through pattern, Decreased stride length, Staggering right Gait velocity: decr     General Gait Details: close guard for safety, occasional light assist to correct R veering which pt claims is the RW's fault and pt unable to correct  Stairs            Wheelchair Mobility    Modified Rankin (Stroke Patients Only)       Balance Overall balance assessment: Needs assistance, History of Falls Sitting-balance support: No upper extremity supported, Feet supported Sitting balance-Leahy Scale: Fair     Standing balance support: Bilateral upper extremity supported, During functional activity Standing balance-Leahy Scale: Poor  Pertinent Vitals/Pain Pain Assessment Pain Assessment: Faces Faces Pain Scale: Hurts a little bit Pain Location: neck Pain Descriptors / Indicators: Sore, Discomfort Pain Intervention(s): Limited activity within patient's tolerance, Monitored  during session, Patient requesting pain meds-RN notified    Home Living Family/patient expects to be discharged to:: Private residence Living Arrangements: Other relatives (granddaughter, her husband and their 73 year old) Available Help at Discharge: Family;Available 24 hours/day Type of Home: House Home Access: Stairs to enter Entrance Stairs-Rails: None Entrance Stairs-Number of Steps: 1   Home Layout: Laundry or work area in basement;Able to live on main level with bedroom/bathroom;Two level Home Equipment: Agricultural consultant (2 wheels);Cane - quad;Shower seat;Other (comment);BSC/3in1 (walking stick)      Prior Function Prior Level of Function : Independent/Modified Independent             Mobility Comments: uses RW in the home per family request since recent admission. pt may be having more falls than reported to family due to pt ability to get off of floor without help at times ADLs Comments: MOD I with ADLs, uses shower chair. washes dishes, cares for her chicken. granddaughter assists with meds. pt does not drive since memory deficits and incident of getting lost in the past     Hand Dominance   Dominant Hand: Right    Extremity/Trunk Assessment   Upper Extremity Assessment Upper Extremity Assessment: Defer to OT evaluation    Lower Extremity Assessment Lower Extremity Assessment: Generalized weakness    Cervical / Trunk Assessment Cervical / Trunk Assessment: Other exceptions Cervical / Trunk Exceptions: C6-7 fx in hard collar (pediatric size)  Communication   Communication: No difficulties  Cognition Arousal/Alertness: Awake/alert Behavior During Therapy: WFL for tasks assessed/performed Overall Cognitive Status: Impaired/Different from baseline Area of Impairment: Memory, Safety/judgement                     Memory: Decreased short-term memory, Decreased recall of precautions   Safety/Judgement: Decreased awareness of safety, Decreased awareness of  deficits     General Comments: pt A&Ox3 (did not ask orientation questions about time), pt does lack insight into deficits and safety requiring safety cues throughout        General Comments General comments (skin integrity, edema, etc.): daughter present    Exercises     Assessment/Plan    PT Assessment Patient needs continued PT services  PT Problem List Decreased strength;Decreased cognition;Decreased range of motion;Decreased activity tolerance;Decreased balance;Decreased mobility;Decreased safety awareness;Decreased knowledge of precautions;Pain       PT Treatment Interventions DME instruction;Therapeutic exercise;Therapeutic activities;Gait training;Patient/family education;Balance training;Stair training;Functional mobility training;Neuromuscular re-education    PT Goals (Current goals can be found in the Care Plan section)  Acute Rehab PT Goals Patient Stated Goal: home PT Goal Formulation: With patient Time For Goal Achievement: 11/09/22 Potential to Achieve Goals: Good    Frequency Min 3X/week     Co-evaluation               AM-PAC PT "6 Clicks" Mobility  Outcome Measure Help needed turning from your back to your side while in a flat bed without using bedrails?: A Little Help needed moving from lying on your back to sitting on the side of a flat bed without using bedrails?: A Little Help needed moving to and from a bed to a chair (including a wheelchair)?: A Little Help needed standing up from a chair using your arms (e.g., wheelchair or bedside chair)?: A Little Help needed to walk in hospital  room?: A Little Help needed climbing 3-5 steps with a railing? : A Little 6 Click Score: 18    End of Session Equipment Utilized During Treatment: Cervical collar Activity Tolerance: Patient tolerated treatment well Patient left: in chair;with call bell/phone within reach;with family/visitor present Nurse Communication: Mobility status PT Visit Diagnosis: Other  abnormalities of gait and mobility (R26.89);History of falling (Z91.81)    Time: [id number] PT Time Calculation (min) (ACUTE ONLY): 32 min   Charges:   PT Evaluation $PT Eval Low Complexity: 1 Low PT Treatments $Therapeutic Activity: 8-22 mins       Stacie Glaze, PT DPT Acute Rehabilitation Services Pager [phone]  Office [phone]   [name] E Ruffin Pyo 10/26/2022, 4:08 PM

## 2022-10-27 ENCOUNTER — Telehealth: Payer: Self-pay | Admitting: *Deleted

## 2022-10-27 ENCOUNTER — Telehealth: Payer: Self-pay

## 2022-10-27 NOTE — Telephone Encounter (Signed)
Pt granddaughter, Wenda Low called regarding conflicting information given on taking Eliquis.  RNCM discussed with discharging MD who suggests that pt continue Eliquis as to prevent stroke.  RNCM relayed information to [name].

## 2022-10-27 NOTE — Telephone Encounter (Signed)
Transition Care Management Follow-up Telephone Call Date of discharge and from where: TCM DC [facility] 10-26-22 Dx: fall  How have you been since you were released from the hospital? Doing ok - little pain but tylenol helps  Any questions or concerns? No  Items Reviewed: Did the pt receive and understand the discharge instructions provided? Yes  Medications obtained and verified? Yes  Other? No  Any new allergies since your discharge? No  Dietary orders reviewed? Yes Do you have support at home? Yes   Home Care and Equipment/Supplies: Were home health services ordered? no If so, what is the name of the agency? [facility]  Has the agency set up a time to come to the patient's home? not applicable Were any new equipment or medical supplies ordered?  Yes: neck brace What is the name of the medical supply agency? Hospital  Were you able to get the supplies/equipment? yes Do you have any questions related to the use of the equipment or supplies? No  Functional Questionnaire: (I = Independent and D = Dependent) ADLs: I  Bathing/Dressing- I  Meal Prep- I  Eating- I  Maintaining continence- I  Transferring/Ambulation- I-WALKER  Managing Meds- I- GRAND DAUGHTER MANAGES   Follow up appointments reviewed:  PCP Hospital f/u appt confirmed? Yes  [name]? No . Are transportation arrangements needed? No  If their condition worsens, is the pt aware to call PCP or go to the Emergency Dept.? Yes Was the patient provided with contact information for the PCP's office or ED? Yes Was to pt encouraged to call back with questions or concerns? Yes   Woodfin Ganja LPN [facility] Nurse Health Advisor Direct Dial [phone]

## 2022-10-29 ENCOUNTER — Ambulatory Visit (INDEPENDENT_AMBULATORY_CARE_PROVIDER_SITE_OTHER): Payer: Medicare Other

## 2022-10-29 VITALS — BP 153/73 | HR 64

## 2022-10-29 DIAGNOSIS — I951 Orthostatic hypotension: Secondary | ICD-10-CM

## 2022-10-29 DIAGNOSIS — I5042 Chronic combined systolic (congestive) and diastolic (congestive) heart failure: Secondary | ICD-10-CM

## 2022-10-29 DIAGNOSIS — W19XXXD Unspecified fall, subsequent encounter: Secondary | ICD-10-CM

## 2022-10-29 NOTE — Progress Notes (Signed)
 Chronic Care Management Pharmacy Note  10/29/2022 Name:  [name] MRN:  [id number] DOB:  03/04/1928  Summary: Patient presents for CCM follow-up. Today's visit was conducted with both [name], patient's granddaughter and caregiver.   -Patient's granddaughter reports patient is still in significant pain following her fall. The patient was interested in Aleve, but we discussed to avoid all NSAIDs. Describes her pain is worst at a 5-6/10.  -Believes fall might related to syncopal episode. She does have a walker that she is utilizing. She does not monitor her home blood pressure or weight at home regularly. Encouraged patient's granddaughter to try and do so.   Recommendations/Changes made from today's visit: -Recommended to continue current medication. Would consider retrial of low-dose ARB if blood pressure tolerates. Defer for now given acute fractures.  -Discussed limiting acetaminophen to 3000 mg daily. -Need to balance risk of pain control with risks of over-sedation. Could trial low-dose tramadol or other analgesic if pain does not improve at PCP follow-up.   Plan: CPP follow-up 1 month  Subjective: [name] is an 88 y.o. year old female who is a primary patient of Nche, Charlotte Lum, NP.  The CCM team was consulted for assistance with disease management and care coordination needs.    Engaged with patient by telephone for follow up visit in response to provider referral for pharmacy case management and/or care coordination services.   Consent to Services:  The patient was given information about Chronic Care Management services, agreed to services, and gave verbal consent prior to initiation of services.  Please see initial visit note for detailed documentation.   Patient Care Team: Nche, Charlotte Lum, NP as PCP - General (Internal Medicine) Nahser, Philip J, MD as PCP - Cardiology (Cardiology) Vedha Tercero A, RPH as Pharmacist (Pharmacist) Weaver, Scott T,  PA-C as Physician Assistant (Cardiology) Pa, [facility] as Consulting Physician (Ophthalmology)  Recent office visits: 09/09/22: Patient presented to Charlote Nche, NP for follow-up. Omeprazole.   Recent consult visits: 10/20/22: Patient presented to Ernest Dick, NP (Cardiology).  09/04/22: Patient presented to Lindsay Finch, PA-C (Cardiology).  04/13/2022 Dr. [facility] MD (Ophthalmology) No Medication Changes noted 02/19/2022 Bhavinkumar Bhagat PA (Cardiology) Decrease Losartan to 25 mg daily  Hospital visits: 10/25/22: Patient presented to ED for fall.  10/3-10/11/23: Hospitalized for HF Exacerbation.  07/07/22: Patient presented to ED after fall.  7/11-7/15/23: Patient hospitalized for syncope.  Objective:  Lab Results  Component Value Date   CREATININE 0.72 10/26/2022   BUN 12 10/26/2022   GFR 67.05 03/20/2022   EGFR 65 10/20/2022   GFRNONAA >60 10/26/2022   GFRAA >60 06/07/2020   NA 135 10/26/2022   K 3.8 10/26/2022   CALCIUM 8.7 (L) 10/26/2022   CO2 26 10/26/2022   GLUCOSE 130 (H) 10/26/2022    Lab Results  Component Value Date/Time   GFR 67.05 03/20/2022 11:34 AM   GFR 49.17 (L) 09/26/2021 07:57 AM    Last diabetic Eye exam: No results found for: "HMDIABEYEEXA"  Last diabetic Foot exam: No results found for: "HMDIABFOOTEX"   Lab Results  Component Value Date   CHOL 102 09/26/2021   HDL 35.60 (L) 09/26/2021   LDLCALC 46 09/26/2021   LDLDIRECT 155.6 05/28/2011   TRIG 104.0 09/26/2021   CHOLHDL 3 09/26/2021       Latest Ref Rng & Units 10/25/2022    8:46 PM 08/25/2022    7:18 PM 06/03/2022    3:38 AM  Hepatic Function  Total Protein 6.5 -   8.1 g/dL 6.8  6.8  5.8   Albumin 3.5 - 5.0 g/dL 3.6  3.5  2.8   AST 15 - 41 U/L 25  33  17   ALT 0 - 44 U/L _0 Alk Phosphatase 38 - 126 U/L 57  70  45   Total Bilirubin 0.3 - 1.2 mg/dL 0.6  0.9  0.7     Lab Results  Component Value Date/Time   TSH 2.99 09/26/2021 07:57 AM   TSH 2.62 01/03/2020 11:26 AM        Latest Ref Rng & Units 10/26/2022   12:50 AM 10/25/2022    8:46 PM 09/09/2022   12:39 PM  CBC  WBC 4.0 - 10.5 K/uL 7.9  8.7  7.5   Hemoglobin 12.0 - 15.0 g/dL 11.8  13.2  13.7   Hematocrit 36.0 - 46.0 % 37.1  40.3  40.8   Platelets 150 - 400 K/uL 169  193  335.0     Lab Results  Component Value Date/Time   VD25OH 66.44 03/20/2022 11:34 AM   VD25OH 28 (L) 08/19/2015 12:54 PM   VD25OH 40 01/13/2013 11:10 AM    Clinical ASCVD: No  The ASCVD Risk score (Arnett DK, et al., 2019) failed to calculate for the following reasons:   The 2019 ASCVD risk score is only valid for ages 53 to 48       03/20/2022   11:23 AM 09/19/2021    4:38 PM 10/07/2018   11:03 AM  Depression screen PHQ 2/9  Decreased Interest 3 0 0  Down, Depressed, Hopeless 0 0 0  PHQ - 2 Score 3 0 0  Altered sleeping 2 1   Tired, decreased energy 1 1   Change in appetite 0 0   Feeling bad or failure about yourself  0 0   Trouble concentrating 3 1   Moving slowly or fidgety/restless 0 0   Suicidal thoughts 0 0   PHQ-9 Score 9 3   Difficult doing work/chores Not difficult at all      Social History   Tobacco Use  Smoking Status Never  Smokeless Tobacco Never   BP Readings from Last 3 Encounters:  10/29/22 (!) 153/73  10/26/22 (!) 152/69  10/20/22 132/64   Pulse Readings from Last 3 Encounters:  10/29/22 64  10/26/22 60  10/20/22 62   Wt Readings from Last 3 Encounters:  10/20/22 114 lb 12.8 oz (52.1 kg)  09/09/22 112 lb 12.8 oz (51.2 kg)  09/08/22 113 lb 9.6 oz (51.5 kg)   BMI Readings from Last 3 Encounters:  10/20/22 23.99 kg/m  09/09/22 23.58 kg/m  09/08/22 23.74 kg/m    Assessment/Interventions: Review of patient past medical history, allergies, medications, health status, including review of consultants reports, laboratory and other test data, was performed as part of comprehensive evaluation and provision of chronic care management services.   SDOH:  (Social Determinants of  Health) assessments and interventions performed: Yes SDOH Interventions    Flowsheet Row ED to Hosp-Admission (Discharged) from 08/25/2022 in [place] Interventions   Food Insecurity Interventions Intervention Not Indicated  Housing Interventions Intervention Not Indicated  Transportation Interventions Intervention Not Indicated  Utilities Interventions Intervention Not Indicated  Alcohol Usage Interventions Intervention Not Indicated (Score <7)  Financial Strain Interventions Intervention Not Indicated      SDOH Screenings   Food Insecurity: No Food Insecurity (08/26/2022)  Housing: Low Risk  (08/26/2022)  Transportation  Needs: No Transportation Needs (08/26/2022)  Utilities: Not At Risk (08/26/2022)  Alcohol Screen: Low Risk  (08/26/2022)  Depression (PHQ2-9): Medium Risk (03/20/2022)  Financial Resource Strain: Low Risk  (08/26/2022)  Tobacco Use: Low Risk  (10/25/2022)    CCM Care Plan  Allergies  Allergen Reactions   Codeine Cough   Crestor [Rosuvastatin Calcium] Cough   Entresto [Sacubitril-Valsartan]     cough   Lisinopril Cough   Pantoprazole Sodium Cough   Pravastatin Cough   Simvastatin Cough    Medications Reviewed Today     Reviewed by Vennie Homans, LPN (Licensed Practical Nurse) on 10/27/22 at [phone]  Med List Status: <None>   Medication Order Taking? Sig Documenting Provider Last Dose Status Informant  acetaminophen (TYLENOL) 325 MG tablet [id number] Yes Take 2 tablets (650 mg total) by mouth every 6 (six) hours as needed for mild pain (or Fever >/= 101). Nita Sells, MD Taking Active Multiple Informants  albuterol (VENTOLIN HFA) 108 (90 Base) MCG/ACT inhaler [id number] Yes Inhale 1-2 puffs into the lungs every 6 (six) hours as needed for wheezing or shortness of breath. [name], NP Taking Active Multiple Informants  amiodarone (PACERONE) 200 MG tablet [id number] Yes Take 1/2 tablet by mouth daily  Patient  taking differently: Take 100 mg by mouth daily. Take 1/2 tablet by mouth daily   Nahser, Wonda Cheng, MD Taking Active Multiple Informants  apixaban (ELIQUIS) 2.5 MG TABS tablet [id number] Yes Take 1 tablet (2.5 mg total) by mouth 2 (two) times daily. Geradine Girt, DO Taking Active Multiple Informants  carvedilol (COREG) 3.125 MG tablet [id number] Yes Take 1 tablet (3.125 mg total) by mouth 2 (two) times daily with a meal. Nita Sells, MD Taking Active Multiple Informants  clopidogrel (PLAVIX) 75 MG tablet [id number] Yes Take 75 mg by mouth daily. [provider] Taking Active Multiple Informants  ezetimibe (ZETIA) 10 MG tablet [id number] Yes TAKE 1 TABLET BY MOUTH  DAILY Nahser, Wonda Cheng, MD Taking Active Multiple Informants  furosemide (LASIX) 20 MG tablet [id number] Yes Take 1 tablet by mouth three times per week  Patient taking differently: Take 20 mg by mouth See admin instructions. Take 1 tablet by mouth three times per week on Mon Wed and Fridays   Nahser, Wonda Cheng, MD Taking Active Multiple Informants  Multiple Vitamins-Minerals (PRESERVISION AREDS 2+MULTI VIT PO) [id number] Yes Take 1 capsule by mouth in the morning and at bedtime. [provider] Taking Active Multiple Informants  spironolactone (ALDACTONE) 25 MG tablet [id number] Yes Take 1 tablet (25 mg total) by mouth daily. Geradine Girt, DO Taking Active Multiple Informants            Patient Active Problem List   Diagnosis Date Noted   Maxillary sinus fracture, closed, initial encounter ([place]) 10/26/2022   Orbital fracture, closed, initial encounter ([place]) 10/25/2022   Closed C6 fracture ([place]) 10/25/2022   PAF (paroxysmal atrial fibrillation) ([place]) 09/08/2022   Acute CHF (congestive heart failure) ([place]) 08/26/2022   Acute hypoxemic respiratory failure ([place]) 08/26/2022   Hypertensive urgency 08/26/2022   Acute on chronic diastolic (congestive) heart failure ([place]) 08/26/2022   Syncope 06/02/2022    Orthostatic hypotension 06/02/2022   CAD (coronary artery disease) 06/02/2022   Statin myopathy 05/30/2021   Chronic combined systolic and diastolic CHF (congestive heart failure) ([place]) 06/07/2020   Chest pain 06/06/2020   Progressive angina ([place]) 05/24/2020   Unstable angina ([place]) 05/24/2020   Anemia 06/09/2013   Asthma 06/09/2013   Dyspnea and  respiratory abnormality 06/09/2013   Psoriasis 01/31/2013   Essential hypertension 07/08/2012   Vitamin D deficiency 12/22/2011   Concussion 11/25/2011   Multiple fractures of ribs of left side 11/25/2011   Grade 2 splenic laceration 11/25/2011   GERD (gastroesophageal reflux disease) 11/25/2011   Atherosclerotic heart disease of native coronary artery without angina pectoris 05/28/2011   Hyperlipidemia 12/05/2008   ASTHMA, UNSPECIFIED 12/05/2008   Diverticulosis of colon 12/05/2008   HEMOCCULT POSITIVE STOOL 12/05/2008   Arthropathy 12/18/2006   Osteopenia 12/18/2006    Immunization History  Administered Date(s) Administered   Fluad Quad(high Dose 65+) 09/09/2019, 09/19/2021   Influenza Split 08/24/2011, 08/23/2013, 08/10/2014, 07/25/2015, 08/19/2015, 09/11/2016   Influenza Whole 09/21/2007   Influenza, High Dose Seasonal PF 10/04/2017, 10/01/2018   Influenza,inj,Quad PF,6+ Mos 08/10/2014, 08/19/2015, 09/11/2016   Influenza-Unspecified 08/24/2011, 08/23/2013, 08/10/2014, 07/25/2015, 08/19/2015, 09/11/2016   Pneumococcal Conjugate-13 08/08/2012   Pneumococcal Polysaccharide-23 01/25/2015   Tdap 11/25/2011    Conditions to be addressed/monitored:  Hypertension, Hyperlipidemia, Heart Failure, Coronary Artery Disease, GERD, Asthma, and Osteopenia  Care Plan : General Pharmacy (Adult)  Updates made by ,  A, RPH since 10/29/2022 12:00 AM     Problem: Hypertension, Hyperlipidemia, Heart Failure, Coronary Artery Disease, GERD, Asthma, and Osteopenia   Priority: High     Long-Range Goal: Patient-Specific Goal   Start  Date: 05/19/2022  Expected End Date: 05/20/2023  This Visit's Progress: On track  Recent Progress: On track  Priority: High  Note:   Current Barriers:  No barriers noted  Pharmacist Clinical Goal(s):  Patient will maintain control of blood pressure as evidenced by BP less than 140/90  through collaboration with PharmD and provider.   Interventions: 1:1 collaboration with Nche, Charlotte Lum, NP regarding development and update of comprehensive plan of care as evidenced by provider attestation and co-signature Inter-disciplinary care team collaboration (see longitudinal plan of care) Comprehensive medication review performed; medication list updated in electronic medical record  Heart Failure (Goal: manage symptoms and prevent exacerbations) -Uncontrolled -Last ejection fraction: 30-35% (Date: Oct 2023) -NYHA Class: III (marked limitation of activity) -AHA HF Stage: C (Heart disease and symptoms present) -Current treatment: Carvedilol 3.125 mg twice daily  Furosemide 20 mg three times weekly  Spironolactone 25 mg daily  -Medications previously tried: Entresto, losartan, valsartan,  -Not on ACEi/ARB/ARNI due to cough/hypotension.  -Current home BP/HR readings: Not checking blood pressure regularly. 153/73, pulse 64.  -Believes fall might related to syncopal episode. She does have a walker that she is utilizing. She does not monitor her home blood pressure or weight at home regularly. Encouraged patient's granddaughter to try and do so.  -Recommended to continue current medication. Would consider retrial of low-dose ARB if blood pressure tolerates. Defer for now given acute fractures.   Atrial Fibrillation (Goal: prevent stroke and major bleeding) -Controlled -CHADSVASC: 6 -Current treatment: Rate control: Carvedilol 3.15 mg twice daily  Rhythm control: Amiodarone 100 mg daily  Anticoagulation: Eliquis 2.5 mg twice daily  -Medications previously tried: NA -Counseled on avoidance of  NSAIDs due to increased bleeding risk with anticoagulants; -Recommended to continue current medication  Hyperlipidemia: (LDL goal < 70) -Controlled -CAD s/p stent RCA 2001; DES RCA 2015, restenting July 2022  -Current treatment: Ezetimibe 10 mg daily  -Current treatment: Clopidogrel 75 mg daily  -Medications previously tried: Repatha  -Recommended to continue current medication  Osteopenia (Goal Improve bone mass and prevent fractures ) -Uncontrolled -Last DEXA Scan: May 2019   T-Score femoral neck: -0.8  T-Score total hip: -  1.3  T-Score lumbar spine: +0.1  10-year probability of major osteoporotic fracture: 9.6%  10-year probability of hip fracture: 2.4% -Patient is not a candidate for pharmacologic treatment -Current treatment  Calcium carbonate  Vitamin D3  -Medications previously tried: NA  -Discussed at length fall precautions (removing rugs or other potential tripping hazards, utilizing walker). Patient had previously been referred for PT but stopped after only a few sessions. Patient's granddaughter states he would be interested in a patient handout of exercises or stretches they can work on in the home. -Recommended to continue current medication  Acute pain (Goal: Improve pain) -Uncontrolled -Current treatment  Acetaminophen 1000 mg 3-4 times daily  -Medications previously tried: NA  -Patient's granddaughter reports patient is still in significant pain following her fall. The patient was interested in Aleve, but we discussed to avoid all NSAIDs. Describes her pain is worst at a 5-6/10. -Discussed limiting acetaminophen to 3000 mg daily. -Need to balance risk of pain control with risks of over-sedation. Could trial low-dose tramadol or other analgesic if pain does not improve at PCP follow-up.   Asthma (Goal: control symptoms and prevent exacerbations) -Not ideally controlled -Current treatment  Ventolin HFA 1-2 puffs every 6 hours as needed -Medications previously  tried: NA  -Pulmonary function testing: None noted. -Exacerbations requiring treatment in last 6 months: None noted. -Frequency of rescue inhaler use: <1x weekly.  -Recommended to continue current medication  Patient Goals/Self-Care Activities Patient will:  - check blood pressure weekly, document, and provide at future appointments weigh daily, and contact provider if weight gain of greater than 2 pounds in 24 hours.  Follow Up Plan: Telephone follow up appointment with care management team member scheduled for:  12/10/2022 at 2:00 PM      Medication Assistance: None required.  Patient affirms current coverage meets needs.  Compliance/Adherence/Medication fill history: Care Gaps: Covid-19 Vaccine Shingrix Vaccine Tetanus Vaccine  Star-Rating Drugs: NA  Patient's preferred pharmacy is:  [id number] - [place] [address] [place] [address] Phone: [phone] Fax: [phone]  [facility] ([place], [place] [facility] [address] [address] Phone: [phone] Fax: [phone]  [id number] [place] [address]: [phone]: [phone] [address] [address] [address]Phone: [phone]: [phone] [facility] [address]: [phone]: [phone] Uses pill box? Yes Pt endorses 80% compliance - Forgets her medications 1-2 x weekly  We discussed: Current pharmacy is preferred with insurance plan and patient is satisfied with pharmacy services Patient decided to: Continue current medication management strategy  Care Plan and Follow Up Patient Decision:  Patient agrees to Care Plan and Follow-up.  Plan: Telephone follow up  appointment with care management team member scheduled for:  12/10/2022 at 2:00 PM  AJunius Argyle PharmD, BPara March CPP Clinical Pharmacist Practitioner  [facility] [phone]

## 2022-10-29 NOTE — Patient Instructions (Signed)
Visit Information It was great speaking with you today!  Please let me know if you have any questions about our visit.   Goals Addressed             This Visit's Progress    Track and Manage Fluids and Swelling-Heart Failure   Not on track    Timeframe:  Long-Range Goal Priority:  High Start Date: 05/19/22                            Expected End Date: 05/20/2023                      Follow Up within 30 days   - call office if I gain more than 2 pounds in one day or 5 pounds in one week - keep legs up while sitting - use salt in moderation - watch for swelling in feet, ankles and legs every day - weigh myself daily    Why is this important?   It is important to check your weight daily and watch how much salt and liquids you have.  It will help you to manage your heart failure.    Notes:         Patient Care Plan: General Pharmacy (Adult)     Problem Identified: Hypertension, Hyperlipidemia, Heart Failure, Coronary Artery Disease, GERD, Asthma, and Osteopenia   Priority: High     Long-Range Goal: Patient-Specific Goal   Start Date: 05/19/2022  Expected End Date: 05/20/2023  This Visit's Progress: On track  Recent Progress: On track  Priority: High  Note:   Current Barriers:  No barriers noted  Pharmacist Clinical Goal(s):  Patient will maintain control of blood pressure as evidenced by BP less than 140/90  through collaboration with PharmD and provider.   Interventions: 1:1 collaboration with Nche, Bonna Gains, NP regarding development and update of comprehensive plan of care as evidenced by provider attestation and co-signature Inter-disciplinary care team collaboration (see longitudinal plan of care) Comprehensive medication review performed; medication list updated in electronic medical record  Heart Failure (Goal: manage symptoms and prevent exacerbations) -Uncontrolled -Last ejection fraction: 30-35% (Date: Oct 2023) -NYHA Class: III (marked limitation  of activity) -AHA HF Stage: C (Heart disease and symptoms present) -Current treatment: Carvedilol 3.125 mg twice daily  Furosemide 20 mg three times weekly  Spironolactone 25 mg daily  -Medications previously tried: Entresto, losartan, valsartan,  -Not on ACEi/ARB/ARNI due to cough/hypotension.  -Current home BP/HR readings: Not checking blood pressure regularly. 153/73, pulse 64.  -Believes fall might related to syncopal episode. She does have a walker that she is utilizing. She does not monitor her home blood pressure or weight at home regularly. Encouraged patient's granddaughter to try and do so.  -Recommended to continue current medication. Would consider retrial of low-dose ARB if blood pressure tolerates. Defer for now given acute fractures.   Atrial Fibrillation (Goal: prevent stroke and major bleeding) -Controlled -CHADSVASC: 6 -Current treatment: Rate control: Carvedilol 3.15 mg twice daily  Rhythm control: Amiodarone 100 mg daily  Anticoagulation: Eliquis 2.5 mg twice daily  -Medications previously tried: NA -Counseled on avoidance of NSAIDs due to increased bleeding risk with anticoagulants; -Recommended to continue current medication  Hyperlipidemia: (LDL goal < 70) -Controlled -CAD s/p stent RCA 2001; DES RCA 2015, restenting July 2022  -Current treatment: Ezetimibe 10 mg daily  -Current treatment: Clopidogrel 75 mg daily  -Medications previously tried: Repatha  -Recommended to  continue current medication  Osteopenia (Goal Improve bone mass and prevent fractures ) -Uncontrolled -Last DEXA Scan: May 2019   T-Score femoral neck: -0.8  T-Score total hip: -1.3  T-Score lumbar spine: +0.1  10-year probability of major osteoporotic fracture: 9.6%  10-year probability of hip fracture: 2.4% -Patient is not a candidate for pharmacologic treatment -Current treatment  Calcium carbonate  Vitamin D3  -Medications previously tried: NA  -Discussed at length fall  precautions (removing rugs or other potential tripping hazards, utilizing walker). Patient had previously been referred for PT but stopped after only a few sessions. Patient's granddaughter states he would be interested in a patient handout of exercises or stretches they can work on in the home. -Recommended to continue current medication  Acute pain (Goal: Improve pain) -Uncontrolled -Current treatment  Acetaminophen 1000 mg 3-4 times daily  -Medications previously tried: NA  -Patient's granddaughter reports patient is still in significant pain following her fall. The patient was interested in Aleve, but we discussed to avoid all NSAIDs. Describes her pain is worst at a 5-6/10. -Discussed limiting acetaminophen to 3000 mg daily. -Need to balance risk of pain control with risks of over-sedation. Could trial low-dose tramadol or other analgesic if pain does not improve at PCP follow-up.   Asthma (Goal: control symptoms and prevent exacerbations) -Not ideally controlled -Current treatment  Ventolin HFA 1-2 puffs every 6 hours as needed -Medications previously tried: NA  -Pulmonary function testing: None noted. -Exacerbations requiring treatment in last 6 months: None noted. -Frequency of rescue inhaler use: <1x weekly.  -Recommended to continue current medication  Patient Goals/Self-Care Activities Patient will:  - check blood pressure weekly, document, and provide at future appointments weigh daily, and contact provider if weight gain of greater than 2 pounds in 24 hours.  Follow Up Plan: Telephone follow up appointment with care management team member scheduled for:  12/10/2022 at 2:00 PM    Patient agreed to services and verbal consent obtained.   Patient verbalizes understanding of instructions and care plan provided today and agrees to view in [place]. Active MyChart status and patient understanding of how to access instructions and care plan via MyChart confirmed with patient.      Junius Argyle, PharmD, Para March, CPP Clinical Pharmacist Practitioner  [place] [facility]  [phone]

## 2022-11-01 ENCOUNTER — Encounter: Payer: Self-pay | Admitting: Cardiovascular Disease
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
## 2022-11-02 ENCOUNTER — Encounter: Payer: Self-pay | Admitting: Cardiovascular Disease

## 2022-11-02 ENCOUNTER — Ambulatory Visit: Payer: Medicare Other | Attending: Cardiovascular Disease | Admitting: Cardiovascular Disease

## 2022-11-02 ENCOUNTER — Ambulatory Visit (INDEPENDENT_AMBULATORY_CARE_PROVIDER_SITE_OTHER): Payer: Medicare Other

## 2022-11-02 VITALS — BP 130/84 | HR 78 | Ht <= 58 in | Wt 110.8 lb

## 2022-11-02 DIAGNOSIS — R55 Syncope and collapse: Secondary | ICD-10-CM

## 2022-11-02 DIAGNOSIS — I480 Paroxysmal atrial fibrillation: Secondary | ICD-10-CM

## 2022-11-02 NOTE — Patient Instructions (Signed)
Medication Instructions:  Your physician recommends that you continue on your current medications as directed. Please refer to the Current Medication list given to you today.  *If you need a refill on your cardiac medications before your next appointment, please call your pharmacy*   Lab Work: NONE If you have labs (blood work) drawn today and your tests are completely normal, you will receive your results only by: MyChart Message (if you have MyChart) OR A paper copy in the mail If you have any lab test that is abnormal or we need to change your treatment, we will call you to review the results.   Testing/Procedures: 14-day Zio monitor   Follow-Up: At [facility], you and your health needs are our priority.  As part of our continuing mission to provide you with exceptional heart care, we have created designated Provider Care Teams.  These Care Teams include your primary Cardiologist (physician) and Advanced Practice Providers (APPs -  Physician Assistants and Nurse Practitioners) who all work together to provide you with the care you need, when you need it.  We recommend signing up for the patient portal called "MyChart".  Sign up information is provided on this After Visit Summary.  MyChart is used to connect with patients for Virtual Visits (Telemedicine).  Patients are able to view lab/test results, encounter notes, upcoming appointments, etc.  Non-urgent messages can be sent to your provider as well.   To learn more about what you can do with MyChart, go to [email or web address].    Your next appointment:   3/22/23n @ 11:20  The format for your next appointment:   In Person  Provider:   [name], MD       Important Information About Sugar

## 2022-11-02 NOTE — Progress Notes (Unsigned)
ZIO XT [id number] from office inventory applied to patient.

## 2022-11-04 ENCOUNTER — Ambulatory Visit (INDEPENDENT_AMBULATORY_CARE_PROVIDER_SITE_OTHER): Payer: Medicare Other | Admitting: Nurse Practitioner

## 2022-11-04 ENCOUNTER — Encounter: Payer: Self-pay | Admitting: Nurse Practitioner

## 2022-11-04 VITALS — BP 140/86 | HR 67 | Temp 97.6°F | Ht <= 58 in | Wt 111.0 lb

## 2022-11-04 DIAGNOSIS — S02401A Maxillary fracture, unspecified, initial encounter for closed fracture: Secondary | ICD-10-CM | POA: Diagnosis not present

## 2022-11-04 DIAGNOSIS — R7303 Prediabetes: Secondary | ICD-10-CM | POA: Diagnosis not present

## 2022-11-04 DIAGNOSIS — S0285XD Fracture of orbit, unspecified, subsequent encounter for fracture with routine healing: Secondary | ICD-10-CM | POA: Diagnosis not present

## 2022-11-04 DIAGNOSIS — S12501A Unspecified nondisplaced fracture of sixth cervical vertebra, initial encounter for closed fracture: Secondary | ICD-10-CM

## 2022-11-04 LAB — POCT GLYCOSYLATED HEMOGLOBIN (HGB A1C): Hemoglobin A1C: 5.9 % — AB (ref 4.0–5.6)

## 2022-11-04 MED ORDER — TRAMADOL HCL 50 MG PO TABS: 25.0000 mg | ORAL_TABLET | Freq: Two times a day (BID) | ORAL | 0 refills | Status: AC | PRN

## 2022-11-04 NOTE — Progress Notes (Signed)
Established Patient Visit  Patient: [name]   DOB: [birth year]   88 y.o. Female  MRN: [id number] Visit Date: 11/06/2022  Subjective:    Chief Complaint  Patient presents with   [facility] F/u - Fall  Needs stronger pain meds   Accompanied daughter HPI ED visit on 10/25/2022 due to syncopal episode. This led to C6 and C7 fracture and orbital floor fracture. Syncope possibly has cardiac etiology. She had f/up appt with cardiology on 10/30/2022: Long term cardiac monitor placed. She also had f/up appt ENT on 10/30/22: orbital fracture is stable per Dr. Pollyann Kennedy. She has upcoming appt with neurosurgery 11/09/2022. Today she denies any dysphagia or hoarseness or change in vision in left eye or headache or fall since ED visit. She complains about neck pain due to constant use of neck collar. She has minimal relief with tylenol. She states she has a shower chair, walker, grab bars and no rugs at home.  Reviewed labs result and radiology reports from recent ED visit.  Reviewed medical, surgical, and social history today  Medications: Outpatient Medications Prior to Visit  Medication Sig   acetaminophen (TYLENOL) 325 MG tablet Take 2 tablets (650 mg total) by mouth every 6 (six) hours as needed for mild pain (or Fever >/= 101).   albuterol (VENTOLIN HFA) 108 (90 Base) MCG/ACT inhaler Inhale 1-2 puffs into the lungs every 6 (six) hours as needed for wheezing or shortness of breath.   amiodarone (PACERONE) 200 MG tablet Take 1/2 tablet by mouth daily (Patient taking differently: Take 100 mg by mouth daily. Take 1/2 tablet by mouth daily)   apixaban (ELIQUIS) 2.5 MG TABS tablet Take 1 tablet (2.5 mg total) by mouth 2 (two) times daily.   carvedilol (COREG) 3.125 MG tablet Take 1 tablet (3.125 mg total) by mouth 2 (two) times daily with a meal.   clopidogrel (PLAVIX) 75 MG tablet Take 75 mg by mouth daily.   ezetimibe (ZETIA) 10 MG tablet TAKE 1 TABLET BY MOUTH   DAILY   furosemide (LASIX) 20 MG tablet Take 1 tablet by mouth three times per week (Patient taking differently: Take 20 mg by mouth See admin instructions. Take 1 tablet by mouth three times per week on Mon Wed and Fridays)   Multiple Vitamins-Minerals (PRESERVISION AREDS 2+MULTI VIT PO) Take 1 capsule by mouth in the morning and at bedtime.   spironolactone (ALDACTONE) 25 MG tablet Take 1 tablet (25 mg total) by mouth daily.   No facility-administered medications prior to visit.   Reviewed past medical and social history.   ROS per HPI above      Objective:  BP (!) 140/86 (BP Location: Right Arm, Patient Position: Sitting, Cuff Size: Small)   Pulse 67   Temp 97.6 F (36.4 C) (Temporal)   Ht 4\' 9"  (1.448 m)   Wt 111 lb (50.3 kg)   SpO2 98%   BMI 24.02 kg/m      Physical Exam Cardiovascular:     Pulses: Normal pulses.     Heart sounds: Normal heart sounds.  Pulmonary:     Effort: Pulmonary effort is normal.     Breath sounds: Normal breath sounds.  Neurological:     Mental Status: She is alert and oriented to person, place, and time.     Results for orders placed or performed in visit on 11/04/22  POCT glycosylated hemoglobin (Hb  A1C)  Result Value Ref Range   Hemoglobin A1C 5.9 (A) 4.0 - 5.6 %      Assessment & Plan:    Problem List Items Addressed This Visit       Respiratory   Maxillary sinus fracture, closed, initial encounter (HCC)   Relevant Medications   traMADol (ULTRAM) 50 MG tablet     Musculoskeletal and Integument   Closed C6 fracture (HCC)   Relevant Medications   traMADol (ULTRAM) 50 MG tablet   Closed fracture of orbit with routine healing   Relevant Medications   traMADol (ULTRAM) 50 MG tablet     Other   Prediabetes - Primary   Relevant Orders   POCT glycosylated hemoglobin (Hb A1C) (Completed)  Advised to Maintain low carb/low sugar diet Maintain appt with neurosurgery Use tylenol 500mg  every 8hrs and tramadol 1/2tab every 12hrs as  needed for severe pain. Use colace daily if you develop constipation.  Return in about 3 months (around 02/03/2023) for prediabetes.     02/05/2023, NP

## 2022-11-04 NOTE — Patient Instructions (Addendum)
hgbA1c at 5.9%: prediabetes. Maintain low carb/low sugar diet  Maintain appt with neurosurgery Use tylenol 500mg  every 8hrs and tramadol 1/2tab every 12hrs as needed for severe pain. Use colace daily if you develop constipation.

## 2022-11-05 ENCOUNTER — Telehealth: Payer: Self-pay | Admitting: Nurse Practitioner

## 2022-11-05 NOTE — Telephone Encounter (Signed)
Caller Name: Wenda Low, great-granddaughter Call back phone #: [phone]   MEDICATION(S):  Tramadol - pharmacy advised since pt has codeine allergy this may make her cough, [name] is concerned since she has a fractured neck that coughing could cause additional pain - please call to advise  Pt out of both meds - spironolactone (ALDACTONE) 25 MG tablet  & apixaban (ELIQUIS) 2.5 MG TABS tablet   They were prescribed by a hospital doctor. Asking for at least temporary dose and to notify if they need to contact cardiologist for future fills.   Preferred Pharmacy:  [facility] [address], [place] - [address] [place] [address] Phone: [phone]  Fax: [phone]

## 2022-11-06 ENCOUNTER — Other Ambulatory Visit: Payer: Self-pay

## 2022-11-06 ENCOUNTER — Telehealth: Payer: Self-pay | Admitting: Cardiovascular Disease

## 2022-11-06 ENCOUNTER — Encounter: Payer: Self-pay | Admitting: Nurse Practitioner

## 2022-11-06 DIAGNOSIS — I480 Paroxysmal atrial fibrillation: Secondary | ICD-10-CM

## 2022-11-06 MED ORDER — SPIRONOLACTONE 25 MG PO TABS: 25.0000 mg | ORAL_TABLET | Freq: Every day | ORAL | 2 refills | Status: DC

## 2022-11-06 MED ORDER — APIXABAN 2.5 MG PO TABS: 2.5000 mg | ORAL_TABLET | Freq: Two times a day (BID) | ORAL | 1 refills | Status: DC

## 2022-11-06 NOTE — Telephone Encounter (Signed)
Noted  

## 2022-11-06 NOTE — Telephone Encounter (Signed)
Prescription refill request for Eliquis received. Indication: Afib  Last office visit: 11/02/22 (N[facility]ser) Scr: 0.72 (10/26/22)  Age: 88 Weight: 50.3kg  Appropriate dose and refill sent to requested pharmacy.

## 2022-11-06 NOTE — Telephone Encounter (Signed)
*  STAT* If patient is at the pharmacy, call can be transferred to refill team.   1. Which medications need to be refilled? (please list name of each medication and dose if known)   spironolactone (ALDACTONE) 25 MG tablet  apixaban (ELIQUIS) 2.5 MG TABS tablet   2. Which pharmacy/location (including street and city if local pharmacy) is medication to be sent to?  [id number] - [place], [place] - [address]   3. Do they need a 30 day or 90 day supply?  90 days  Granddaughter stated the patient has been out of these medications for the last 2 days.

## 2022-11-06 NOTE — Telephone Encounter (Signed)
[name] called and I notified her of Charlotte's recommendation below

## 2022-11-22 DIAGNOSIS — I5042 Chronic combined systolic (congestive) and diastolic (congestive) heart failure: Secondary | ICD-10-CM

## 2022-11-30 ENCOUNTER — Emergency Department (HOSPITAL_COMMUNITY): Payer: Medicare Other

## 2022-11-30 ENCOUNTER — Encounter (HOSPITAL_COMMUNITY): Payer: Self-pay

## 2022-11-30 ENCOUNTER — Other Ambulatory Visit: Payer: Self-pay | Admitting: Cardiovascular Disease

## 2022-11-30 ENCOUNTER — Other Ambulatory Visit: Payer: Self-pay

## 2022-11-30 ENCOUNTER — Inpatient Hospital Stay (HOSPITAL_COMMUNITY)
Admission: EM | Admit: 2022-11-30 | Discharge: 2022-12-02 | DRG: 871 | Disposition: A | Discharge status: Home Health | Payer: Medicare Other | Attending: Internal Medicine | Admitting: Internal Medicine

## 2022-11-30 DIAGNOSIS — Z8249 Family history of ischemic heart disease and other diseases of the circulatory system: Secondary | ICD-10-CM

## 2022-11-30 DIAGNOSIS — Z955 Presence of coronary angioplasty implant and graft: Secondary | ICD-10-CM | POA: Diagnosis not present

## 2022-11-30 DIAGNOSIS — Z66 Do not resuscitate: Secondary | ICD-10-CM | POA: Diagnosis present

## 2022-11-30 DIAGNOSIS — I2510 Atherosclerotic heart disease of native coronary artery without angina pectoris: Secondary | ICD-10-CM | POA: Diagnosis present

## 2022-11-30 DIAGNOSIS — S12400A Unspecified displaced fracture of fifth cervical vertebra, initial encounter for closed fracture: Secondary | ICD-10-CM | POA: Diagnosis present

## 2022-11-30 DIAGNOSIS — E559 Vitamin D deficiency, unspecified: Secondary | ICD-10-CM | POA: Diagnosis present

## 2022-11-30 DIAGNOSIS — Z7902 Long term (current) use of antithrombotics/antiplatelets: Secondary | ICD-10-CM

## 2022-11-30 DIAGNOSIS — Z833 Family history of diabetes mellitus: Secondary | ICD-10-CM | POA: Diagnosis not present

## 2022-11-30 DIAGNOSIS — I5042 Chronic combined systolic (congestive) and diastolic (congestive) heart failure: Secondary | ICD-10-CM | POA: Diagnosis present

## 2022-11-30 DIAGNOSIS — I480 Paroxysmal atrial fibrillation: Secondary | ICD-10-CM | POA: Diagnosis present

## 2022-11-30 DIAGNOSIS — Z1152 Encounter for screening for COVID-19: Secondary | ICD-10-CM | POA: Diagnosis not present

## 2022-11-30 DIAGNOSIS — E785 Hyperlipidemia, unspecified: Secondary | ICD-10-CM | POA: Diagnosis present

## 2022-11-30 DIAGNOSIS — J189 Pneumonia, unspecified organism: Secondary | ICD-10-CM | POA: Diagnosis present

## 2022-11-30 DIAGNOSIS — S02401S Maxillary fracture, unspecified, sequela: Secondary | ICD-10-CM

## 2022-11-30 DIAGNOSIS — K219 Gastro-esophageal reflux disease without esophagitis: Secondary | ICD-10-CM | POA: Diagnosis present

## 2022-11-30 DIAGNOSIS — I110 Hypertensive heart disease with heart failure: Secondary | ICD-10-CM | POA: Diagnosis present

## 2022-11-30 DIAGNOSIS — E871 Hypo-osmolality and hyponatremia: Secondary | ICD-10-CM | POA: Diagnosis present

## 2022-11-30 DIAGNOSIS — Z7901 Long term (current) use of anticoagulants: Secondary | ICD-10-CM | POA: Diagnosis not present

## 2022-11-30 DIAGNOSIS — S0285XD Fracture of orbit, unspecified, subsequent encounter for fracture with routine healing: Secondary | ICD-10-CM

## 2022-11-30 DIAGNOSIS — M50222 Other cervical disc displacement at C5-C6 level: Secondary | ICD-10-CM | POA: Diagnosis present

## 2022-11-30 DIAGNOSIS — J121 Respiratory syncytial virus pneumonia: Secondary | ICD-10-CM | POA: Diagnosis present

## 2022-11-30 DIAGNOSIS — B338 Other specified viral diseases: Secondary | ICD-10-CM | POA: Diagnosis present

## 2022-11-30 DIAGNOSIS — A419 Sepsis, unspecified organism: Secondary | ICD-10-CM | POA: Diagnosis not present

## 2022-11-30 DIAGNOSIS — R296 Repeated falls: Secondary | ICD-10-CM | POA: Diagnosis present

## 2022-11-30 DIAGNOSIS — A4189 Other specified sepsis: Principal | ICD-10-CM | POA: Diagnosis present

## 2022-11-30 DIAGNOSIS — J45909 Unspecified asthma, uncomplicated: Secondary | ICD-10-CM | POA: Diagnosis present

## 2022-11-30 DIAGNOSIS — Y92008 Other place in unspecified non-institutional (private) residence as the place of occurrence of the external cause: Secondary | ICD-10-CM | POA: Diagnosis not present

## 2022-11-30 DIAGNOSIS — I25119 Atherosclerotic heart disease of native coronary artery with unspecified angina pectoris: Secondary | ICD-10-CM | POA: Diagnosis present

## 2022-11-30 DIAGNOSIS — E861 Hypovolemia: Secondary | ICD-10-CM | POA: Diagnosis present

## 2022-11-30 DIAGNOSIS — M50221 Other cervical disc displacement at C4-C5 level: Secondary | ICD-10-CM | POA: Diagnosis present

## 2022-11-30 DIAGNOSIS — W1830XA Fall on same level, unspecified, initial encounter: Secondary | ICD-10-CM | POA: Diagnosis present

## 2022-11-30 DIAGNOSIS — Z809 Family history of malignant neoplasm, unspecified: Secondary | ICD-10-CM

## 2022-11-30 DIAGNOSIS — S12500A Unspecified displaced fracture of sixth cervical vertebra, initial encounter for closed fracture: Secondary | ICD-10-CM | POA: Diagnosis present

## 2022-11-30 DIAGNOSIS — Z8781 Personal history of (healed) traumatic fracture: Secondary | ICD-10-CM | POA: Diagnosis present

## 2022-11-30 DIAGNOSIS — Y95 Nosocomial condition: Principal | ICD-10-CM

## 2022-11-30 DIAGNOSIS — I5043 Acute on chronic combined systolic (congestive) and diastolic (congestive) heart failure: Secondary | ICD-10-CM | POA: Diagnosis present

## 2022-11-30 DIAGNOSIS — E039 Hypothyroidism, unspecified: Secondary | ICD-10-CM | POA: Diagnosis present

## 2022-11-30 DIAGNOSIS — M4802 Spinal stenosis, cervical region: Secondary | ICD-10-CM | POA: Diagnosis present

## 2022-11-30 HISTORY — DX: Hypo-osmolality and hyponatremia: E87.1

## 2022-11-30 LAB — CBC WITH DIFFERENTIAL/PLATELET
Abs Immature Granulocytes: 0.08 10*3/uL — ABNORMAL HIGH (ref 0.00–0.07)
Basophils Absolute: 0 10*3/uL (ref 0.0–0.1)
Basophils Relative: 0 %
Eosinophils Absolute: 0 10*3/uL (ref 0.0–0.5)
Eosinophils Relative: 0 %
HCT: 40.4 % (ref 36.0–46.0)
Hemoglobin: 13.3 g/dL (ref 12.0–15.0)
Immature Granulocytes: 1 %
Lymphocytes Relative: 7 %
Lymphs Abs: 1.1 10*3/uL (ref 0.7–4.0)
MCH: 31.6 pg (ref 26.0–34.0)
MCHC: 32.9 g/dL (ref 30.0–36.0)
MCV: 96 fL (ref 80.0–100.0)
Monocytes Absolute: 1 10*3/uL (ref 0.1–1.0)
Monocytes Relative: 6 %
Neutro Abs: 13.3 10*3/uL — ABNORMAL HIGH (ref 1.7–7.7)
Neutrophils Relative %: 86 %
Platelets: 192 10*3/uL (ref 150–400)
RBC: 4.21 MIL/uL (ref 3.87–5.11)
RDW: 15.3 % (ref 11.5–15.5)
WBC: 15.5 10*3/uL — ABNORMAL HIGH (ref 4.0–10.5)
nRBC: 0 % (ref 0.0–0.2)

## 2022-11-30 LAB — URINALYSIS, ROUTINE W REFLEX MICROSCOPIC
Bacteria, UA: NONE SEEN
Bilirubin Urine: NEGATIVE
Glucose, UA: NEGATIVE mg/dL
Ketones, ur: NEGATIVE mg/dL
Leukocytes,Ua: NEGATIVE
Nitrite: NEGATIVE
Protein, ur: NEGATIVE mg/dL
Specific Gravity, Urine: 1.012 (ref 1.005–1.030)
pH: 6 (ref 5.0–8.0)

## 2022-11-30 LAB — COMPREHENSIVE METABOLIC PANEL
ALT: 12 U/L (ref 0–44)
AST: 23 U/L (ref 15–41)
Albumin: 3.9 g/dL (ref 3.5–5.0)
Alkaline Phosphatase: 67 U/L (ref 38–126)
Anion gap: 8 (ref 5–15)
BUN: 19 mg/dL (ref 8–23)
CO2: 25 mmol/L (ref 22–32)
Calcium: 9 mg/dL (ref 8.9–10.3)
Chloride: 94 mmol/L — ABNORMAL LOW (ref 98–111)
Creatinine, Ser: 0.96 mg/dL (ref 0.44–1.00)
GFR, Estimated: 56 mL/min — ABNORMAL LOW (ref >60)
Glucose, Bld: 144 mg/dL — ABNORMAL HIGH (ref 70–99)
Potassium: 4.2 mmol/L (ref 3.5–5.1)
Sodium: 127 mmol/L — ABNORMAL LOW (ref 135–145)
Total Bilirubin: 0.8 mg/dL (ref 0.3–1.2)
Total Protein: 7.4 g/dL (ref 6.5–8.1)

## 2022-11-30 LAB — RESP PANEL BY RT-PCR (RSV, FLU A&B, COVID)  RVPGX2
Influenza A by PCR: NEGATIVE
Influenza B by PCR: NEGATIVE
Resp Syncytial Virus by PCR: POSITIVE — AB
SARS Coronavirus 2 by RT PCR: NEGATIVE

## 2022-11-30 LAB — PROTIME-INR
INR: 1.3 — ABNORMAL HIGH (ref 0.8–1.2)
Prothrombin Time: 16.3 seconds — ABNORMAL HIGH (ref 11.4–15.2)

## 2022-11-30 LAB — LACTIC ACID, PLASMA: Lactic Acid, Venous: 1.6 mmol/L (ref 0.5–1.9)

## 2022-11-30 MED ORDER — ALBUTEROL SULFATE (2.5 MG/3ML) 0.083% IN NEBU: 3.0000 mL | INHALATION_SOLUTION | Freq: Four times a day (QID) | RESPIRATORY_TRACT | Status: DC | PRN

## 2022-11-30 MED ORDER — CLOPIDOGREL BISULFATE 75 MG PO TABS
75.0000 mg | ORAL_TABLET | Freq: Every day | ORAL | Status: DC
  Administered 2022-12-01 – 2022-12-02 (×2): 75 mg via ORAL
  Filled 2022-11-30 (×2): qty 1

## 2022-11-30 MED ORDER — ACETAMINOPHEN 325 MG PO TABS
650.0000 mg | ORAL_TABLET | Freq: Once | ORAL | Status: AC | PRN
  Administered 2022-11-30: 650 mg via ORAL
  Filled 2022-11-30: qty 2

## 2022-11-30 MED ORDER — VANCOMYCIN HCL IN DEXTROSE 1-5 GM/200ML-% IV SOLN
1000.0000 mg | Freq: Once | INTRAVENOUS | Status: AC
  Administered 2022-11-30: 1000 mg via INTRAVENOUS
  Filled 2022-11-30: qty 200

## 2022-11-30 MED ORDER — SODIUM CHLORIDE 0.9 % IV SOLN
2.0000 g | Freq: Once | INTRAVENOUS | Status: AC
  Administered 2022-11-30: 2 g via INTRAVENOUS
  Filled 2022-11-30: qty 12.5

## 2022-11-30 MED ORDER — SODIUM CHLORIDE 0.9 % IV SOLN: INTRAVENOUS | Status: AC

## 2022-11-30 MED ORDER — SODIUM CHLORIDE 0.9 % IV SOLN
2.0000 g | INTRAVENOUS | Status: DC
  Administered 2022-12-01 – 2022-12-02 (×2): 2 g via INTRAVENOUS
  Filled 2022-11-30 (×2): qty 20

## 2022-11-30 MED ORDER — SPIRONOLACTONE 25 MG PO TABS
25.0000 mg | ORAL_TABLET | Freq: Every day | ORAL | Status: DC
  Administered 2022-12-01 – 2022-12-02 (×2): 25 mg via ORAL
  Filled 2022-11-30 (×2): qty 1

## 2022-11-30 MED ORDER — SODIUM CHLORIDE 0.9% FLUSH
3.0000 mL | Freq: Two times a day (BID) | INTRAVENOUS | Status: DC
  Administered 2022-12-01 – 2022-12-02 (×4): 3 mL via INTRAVENOUS

## 2022-11-30 MED ORDER — ACETAMINOPHEN 325 MG PO TABS
650.0000 mg | ORAL_TABLET | Freq: Four times a day (QID) | ORAL | Status: DC | PRN
  Administered 2022-12-01 – 2022-12-02 (×2): 650 mg via ORAL
  Filled 2022-11-30 (×2): qty 2

## 2022-11-30 MED ORDER — APIXABAN 2.5 MG PO TABS
2.5000 mg | ORAL_TABLET | Freq: Two times a day (BID) | ORAL | Status: DC
  Administered 2022-12-01 – 2022-12-02 (×3): 2.5 mg via ORAL
  Filled 2022-11-30 (×3): qty 1

## 2022-11-30 MED ORDER — LACTATED RINGERS IV SOLN: INTRAVENOUS | Status: DC

## 2022-11-30 MED ORDER — ACETAMINOPHEN 650 MG RE SUPP: 650.0000 mg | Freq: Four times a day (QID) | RECTAL | Status: DC | PRN

## 2022-11-30 MED ORDER — SODIUM CHLORIDE 0.9 % IV SOLN
500.0000 mg | INTRAVENOUS | Status: DC
  Administered 2022-12-01 – 2022-12-02 (×2): 500 mg via INTRAVENOUS
  Filled 2022-11-30 (×3): qty 5

## 2022-11-30 MED ORDER — ONDANSETRON HCL 4 MG PO TABS: 4.0000 mg | ORAL_TABLET | Freq: Four times a day (QID) | ORAL | Status: DC | PRN

## 2022-11-30 MED ORDER — HYDROGEN PEROXIDE 3 % EX SOLN
CUTANEOUS | Status: AC
  Filled 2022-11-30: qty 473

## 2022-11-30 MED ORDER — SENNOSIDES-DOCUSATE SODIUM 8.6-50 MG PO TABS: 1.0000 | ORAL_TABLET | Freq: Every evening | ORAL | Status: DC | PRN

## 2022-11-30 MED ORDER — EZETIMIBE 10 MG PO TABS
10.0000 mg | ORAL_TABLET | Freq: Every day | ORAL | Status: DC
  Administered 2022-12-01 – 2022-12-02 (×2): 10 mg via ORAL
  Filled 2022-11-30 (×2): qty 1

## 2022-11-30 MED ORDER — ONDANSETRON HCL 4 MG/2ML IJ SOLN: 4.0000 mg | Freq: Four times a day (QID) | INTRAMUSCULAR | Status: DC | PRN

## 2022-11-30 MED ORDER — AMIODARONE HCL 200 MG PO TABS
100.0000 mg | ORAL_TABLET | Freq: Every day | ORAL | Status: DC
  Administered 2022-12-01 – 2022-12-02 (×2): 100 mg via ORAL
  Filled 2022-11-30 (×2): qty 1

## 2022-11-30 MED ORDER — CARVEDILOL 3.125 MG PO TABS
3.1250 mg | ORAL_TABLET | Freq: Two times a day (BID) | ORAL | Status: DC
  Administered 2022-12-01 – 2022-12-02 (×3): 3.125 mg via ORAL
  Filled 2022-11-30 (×3): qty 1

## 2022-11-30 NOTE — ED Triage Notes (Signed)
Patient had a fall 4 weeks ago and had 3 fractures to her neck, fractures to a rib, and a facial fracture. Patient presents with a  soft cervical collar.  Patient lives with a grand daughter. It was reported by her daughter that the patient had another fall today, Patient denies hitting her head. Patient reports increased weakness, a non productive cough, and a fever in triage-103.2
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
## 2022-11-30 NOTE — Hospital Course (Signed)
[name] is a 89 y.o. female with medical history significant for PAF on Eliquis, chronic combined systolic and diastolic CHF (EF 54-00%), CAD s/p DES, LBBB, chronic orthostasis, HTN, HLD, asthma, frequent falls who is admitted with sepsis in setting of RSV viral infection with concern for superimposed bacterial pneumonia of the right lung.

## 2022-11-30 NOTE — Progress Notes (Signed)
Pharmacy Note   A consult was received from an ED physician for cefepime per pharmacy dosing.    The patient's profile has been reviewed for ht/wt/allergies/indication/available labs.    A one time order has been placed for cefepime 2 gr IV x 1 .    Further antibiotics/pharmacy consults should be ordered by admitting physician if indicated.                       Thank you,  Royetta Asal, PharmD, BCPS 11/30/2022 8:16 PM

## 2022-11-30 NOTE — ED Provider Notes (Signed)
[facility]-EMERGENCY DEPT Provider Note   CSN: [id number] Arrival date & time: 11/30/22  1728     History Chief Complaint  Patient presents with   Cough   Fall   Weakness   Fever    [name] is a 89 y.o. female.   Cough Associated symptoms: fever and shortness of breath   Associated symptoms: no chest pain and no chills   Fall Associated symptoms include shortness of breath. Pertinent negatives include no chest pain and no abdominal pain.  Weakness Associated symptoms: cough, fever and shortness of breath   Associated symptoms: no abdominal pain, no chest pain, no diarrhea, no nausea and no vomiting   Fever Associated symptoms: congestion and cough   Associated symptoms: no chest pain, no chills, no diarrhea, no nausea and no vomiting   Patient presents emergency department complaints of a cough and new fall. Patient does not believe she hit her head but is on Eliquis for a-fib. She reports that she recently had a fall which resulted in several fractures to her neck, rib, and face.  Patient reports that she was walking to the bathroom earlier today when she but it sounds to be a mechanical fall where she stumbled over her feet without obvious loss of consciousness.  She denies any syncopal episode and feels that she can recall the fall and the events leading after.  Denies any headaches, nausea, vomiting.   Patient's granddaughter was home when she had a fall but did not witness the fall.     Home Medications Prior to Admission medications   Medication Sig Start Date End Date Taking? Authorizing Provider  acetaminophen (TYLENOL) 325 MG tablet Take 2 tablets (650 mg total) by mouth every 6 (six) hours as needed for mild pain (or Fever >/= 101). 09/02/22   Rhetta Mura, MD  albuterol (VENTOLIN HFA) 108 (90 Base) MCG/ACT inhaler Inhale 1-2 puffs into the lungs every 6 (six) hours as needed for wheezing or shortness of breath. 01/24/21   Nche,  Bonna Gains, NP  amiodarone (PACERONE) 200 MG tablet Take 1/2 tablet by mouth daily Patient taking differently: Take 100 mg by mouth daily. Take 1/2 tablet by mouth daily 09/08/22   Nahser, Deloris Ping, MD  apixaban (ELIQUIS) 2.5 MG TABS tablet Take 1 tablet (2.5 mg total) by mouth 2 (two) times daily. 11/06/22   Nahser, Deloris Ping, MD  carvedilol (COREG) 3.125 MG tablet Take 1 tablet (3.125 mg total) by mouth 2 (two) times daily with a meal. 09/02/22   Rhetta Mura, MD  clopidogrel (PLAVIX) 75 MG tablet Take 75 mg by mouth daily.    [provider]  ezetimibe (ZETIA) 10 MG tablet TAKE 1 TABLET BY MOUTH  DAILY 06/30/22   Nahser, Deloris Ping, MD  furosemide (LASIX) 20 MG tablet Take 1 tablet by mouth three times per week Patient taking differently: Take 20 mg by mouth See admin instructions. Take 1 tablet by mouth three times per week on Mon Wed and Fridays 09/08/22   Nahser, Deloris Ping, MD  Multiple Vitamins-Minerals (PRESERVISION AREDS 2+MULTI VIT PO) Take 1 capsule by mouth in the morning and at bedtime.    [provider]  spironolactone (ALDACTONE) 25 MG tablet Take 1 tablet (25 mg total) by mouth daily. 11/06/22   Nahser, Deloris Ping, MD      Allergies    Codeine, Crestor [rosuvastatin calcium], Entresto [sacubitril-valsartan], Lisinopril, Pantoprazole sodium, Pravastatin, and Simvastatin    Review of Systems  Review of Systems  Constitutional:  Positive for fatigue and fever. Negative for chills.  HENT:  Positive for congestion.   Respiratory:  Positive for cough and shortness of breath. Negative for chest tightness.   Cardiovascular:  Negative for chest pain.  Gastrointestinal:  Negative for abdominal pain, diarrhea, nausea and vomiting.  Neurological:  Positive for weakness and light-headedness.  All other systems reviewed and are negative.   Physical Exam Updated Vital Signs BP 118/60   Pulse 70   Temp 97.7 F (36.5 C)   Resp 19   Ht 4\' 10"  (1.473 m)   Wt  51.3 kg   SpO2 93%   BMI 23.62 kg/m  Physical Exam Vitals and nursing note reviewed.  Constitutional:      General: She is not in acute distress.    Appearance: Normal appearance. She is ill-appearing.  HENT:     Head: Normocephalic and atraumatic.     Nose: Congestion present.  Eyes:     Conjunctiva/sclera: Conjunctivae normal.  Cardiovascular:     Rate and Rhythm: Normal rate and regular rhythm.     Pulses: Normal pulses.     Heart sounds: Normal heart sounds.  Pulmonary:     Effort: Pulmonary effort is normal.     Breath sounds: Wheezing present.  Abdominal:     General: Abdomen is flat. Bowel sounds are normal.  Musculoskeletal:        General: Normal range of motion.  Skin:    General: Skin is warm.     Capillary Refill: Capillary refill takes less than 2 seconds.  Neurological:     General: No focal deficit present.     Mental Status: She is alert and oriented to person, place, and time. Mental status is at baseline.     ED Results / Procedures / Treatments   Labs (all labs ordered are listed, but only abnormal results are displayed) Labs Reviewed  RESP PANEL BY RT-PCR (RSV, FLU A&B, COVID)  RVPGX2 - Abnormal; Notable for the following components:      Result Value   Resp Syncytial Virus by PCR POSITIVE (*)    All other components within normal limits  CBC WITH DIFFERENTIAL/PLATELET - Abnormal; Notable for the following components:   WBC 15.5 (*)    Neutro Abs 13.3 (*)    Abs Immature Granulocytes 0.08 (*)    All other components within normal limits  COMPREHENSIVE METABOLIC PANEL - Abnormal; Notable for the following components:   Sodium 127 (*)    Chloride 94 (*)    Glucose, Bld 144 (*)    GFR, Estimated 56 (*)    All other components within normal limits  URINALYSIS, ROUTINE W REFLEX MICROSCOPIC - Abnormal; Notable for the following components:   Hgb urine dipstick SMALL (*)    All other components within normal limits  PROTIME-INR - Abnormal; Notable  for the following components:   Prothrombin Time 16.3 (*)    INR 1.3 (*)    All other components within normal limits  URINE CULTURE  CULTURE, BLOOD (ROUTINE X 2)  CULTURE, BLOOD (ROUTINE X 2)  LACTIC ACID, PLASMA  LACTIC ACID, PLASMA    EKG None  Radiology CT HEAD WO CONTRAST (5MM)  Result Date: 11/30/2022 CLINICAL DATA:  Head and neck trauma. 89 year old female. Check for intracranial venous injury. EXAM: CT HEAD WITHOUT CONTRAST CT CERVICAL SPINE WITHOUT CONTRAST TECHNIQUE: Multidetector CT imaging of the head and cervical spine was performed following the standard protocol without intravenous contrast.  Multiplanar CT image reconstructions of the cervical spine were also generated. RADIATION DOSE REDUCTION: This exam was performed according to the departmental dose-optimization program which includes automated exposure control, adjustment of the mA and/or kV according to patient size and/or use of iterative reconstruction technique. COMPARISON:  CT scan head and cervical spine both 10/25/2022. CT scan cervical spine 07/07/2022. FINDINGS: CT HEAD FINDINGS Brain: There is moderately advanced atrophy, small-vessel disease and atrophic ventriculomegaly. No recent infarct, acute hemorrhage or mass is seen. There is no midline shift. Basal cisterns are patent. Vascular: The distal vertebral arteries and both siphons are heavily calcified. No hyperdense central vessel. Skull: No fracture or focal lesion is seen. Sinuses/Orbits: Again noted as on 10/25/2022, is a comminuted mildly depressed fracture of the left orbital floor extending into the anterior wall of left maxillary sinus, only partially visible. There is again noted a slightly displaced fracture of posterolateral left maxillary sinus wall. No asymmetry of the globes is seen, with bilateral old lens replacements. Interval resolution of previous left maxillary sinus hemorrhagic fluid level. There is increased opacification of the bilateral ethmoid  air cells, with fluid and increased membrane thickening in the right maxillary and right sphenoid sinus. The fluid is low in density and does not appear compatible with the hemorrhagic material. The frontal and sphenoid sinus, bilateral mastoid air cells and middle ears are clear. Other: None. CT CERVICAL SPINE FINDINGS Alignment: There is a 2 mm anterolisthesis of C6 on 7, first seen on 10/25/2022, not seen on 07/07/2022, likely a minimal traumatic listhesis, related to an again noted fracture of the right C6 lamina and right C6 articular pillar. There is a chronic 2 mm degenerative anterolisthesis at C7-T1. Other cervical alignment is unremarkable. Skull base and vertebrae: There is new demonstration of an acute nondisplaced fracture through the right C5 lamina with extension to the base of the superior articular process of C5 on the right. Again noted is a slightly displaced right C6 lamina fracture extending to the superior and inferior articular process of C6 on the right, and a nondisplaced fracture of the right superior C7 articular process, all of which were present on 10/25/2022. No interval healing is seen. Osteopenia is present without other pathologic process or appreciable further fractures. There is ankylosis across the right C3-4 and the bilateral T1-2 facet joints, as before. Soft tissues and spinal canal, discs: Spinal canal is moderately narrowed due to degenerative changes, particularly C4-5 and C5-6 where there appear to be significant right paracentral disc protrusions impressing on the cord. This was seen previously. No canal hematoma is seen on this limited exam. The discs are diffusely degenerated, with narrowing and spurring again at the anterior atlantodental joint and multilevel degenerative foraminal stenosis, facet and uncinate hypertrophy. Upper chest: There is biapical pleural-parenchymal scarring. No acute upper abdominal findings. Other: There is bilateral carotid atherosclerosis,  heaviest in the proximal left cervical ICA. IMPRESSION: 1. No acute intracranial CT findings or depressed skull fractures. 2. Atrophy, small-vessel disease and intracranial atherosclerosis. 3. Acute nondisplaced fracture through the right C5 lamina with extension to the base of the superior articular process of C5 on the right. 4. Again noted slightly displaced right C6 lamina fracture extending to the superior and inferior articular processes of C6, and nondisplaced fracture of the right superior C7 articular process, all of which were present on 10/25/2022. 5. 2 mm anterolisthesis of C6 on 7, first seen on 10/25/2022, not seen on 07/07/2022, likely a minimal traumatic listhesis related to the right  C6 lamina and articular pillar fracture. 6. Osteopenia and degenerative change. 7. Multilevel degenerative spinal canal narrowing, greatest at C4-5 and C5-6 were there again appear to be right paracentral disc herniations impressing on the cord. No definitive canal hematoma. 8. Multifocal increased sinus disease, including with fluid levels in the right maxillary and right sphenoid sinuses. 9. Carotid atherosclerosis. 10. Partially visible, again noted comminuted slightly depressed left orbital floor fracture with extension to the anterior left maxillary sinus wall, and subacute posterolateral left maxillary sinus fracture. Electronically Signed   By: Almira Bar M.D.   On: 11/30/2022 20:37   CT Cervical Spine Wo Contrast  Result Date: 11/30/2022 CLINICAL DATA:  Head and neck trauma. 89 year old female. Check for intracranial venous injury. EXAM: CT HEAD WITHOUT CONTRAST CT CERVICAL SPINE WITHOUT CONTRAST TECHNIQUE: Multidetector CT imaging of the head and cervical spine was performed following the standard protocol without intravenous contrast. Multiplanar CT image reconstructions of the cervical spine were also generated. RADIATION DOSE REDUCTION: This exam was performed according to the departmental  dose-optimization program which includes automated exposure control, adjustment of the mA and/or kV according to patient size and/or use of iterative reconstruction technique. COMPARISON:  CT scan head and cervical spine both 10/25/2022. CT scan cervical spine 07/07/2022. FINDINGS: CT HEAD FINDINGS Brain: There is moderately advanced atrophy, small-vessel disease and atrophic ventriculomegaly. No recent infarct, acute hemorrhage or mass is seen. There is no midline shift. Basal cisterns are patent. Vascular: The distal vertebral arteries and both siphons are heavily calcified. No hyperdense central vessel. Skull: No fracture or focal lesion is seen. Sinuses/Orbits: Again noted as on 10/25/2022, is a comminuted mildly depressed fracture of the left orbital floor extending into the anterior wall of left maxillary sinus, only partially visible. There is again noted a slightly displaced fracture of posterolateral left maxillary sinus wall. No asymmetry of the globes is seen, with bilateral old lens replacements. Interval resolution of previous left maxillary sinus hemorrhagic fluid level. There is increased opacification of the bilateral ethmoid air cells, with fluid and increased membrane thickening in the right maxillary and right sphenoid sinus. The fluid is low in density and does not appear compatible with the hemorrhagic material. The frontal and sphenoid sinus, bilateral mastoid air cells and middle ears are clear. Other: None. CT CERVICAL SPINE FINDINGS Alignment: There is a 2 mm anterolisthesis of C6 on 7, first seen on 10/25/2022, not seen on 07/07/2022, likely a minimal traumatic listhesis, related to an again noted fracture of the right C6 lamina and right C6 articular pillar. There is a chronic 2 mm degenerative anterolisthesis at C7-T1. Other cervical alignment is unremarkable. Skull base and vertebrae: There is new demonstration of an acute nondisplaced fracture through the right C5 lamina with extension  to the base of the superior articular process of C5 on the right. Again noted is a slightly displaced right C6 lamina fracture extending to the superior and inferior articular process of C6 on the right, and a nondisplaced fracture of the right superior C7 articular process, all of which were present on 10/25/2022. No interval healing is seen. Osteopenia is present without other pathologic process or appreciable further fractures. There is ankylosis across the right C3-4 and the bilateral T1-2 facet joints, as before. Soft tissues and spinal canal, discs: Spinal canal is moderately narrowed due to degenerative changes, particularly C4-5 and C5-6 where there appear to be significant right paracentral disc protrusions impressing on the cord. This was seen previously. No canal hematoma is seen  on this limited exam. The discs are diffusely degenerated, with narrowing and spurring again at the anterior atlantodental joint and multilevel degenerative foraminal stenosis, facet and uncinate hypertrophy. Upper chest: There is biapical pleural-parenchymal scarring. No acute upper abdominal findings. Other: There is bilateral carotid atherosclerosis, heaviest in the proximal left cervical ICA. IMPRESSION: 1. No acute intracranial CT findings or depressed skull fractures. 2. Atrophy, small-vessel disease and intracranial atherosclerosis. 3. Acute nondisplaced fracture through the right C5 lamina with extension to the base of the superior articular process of C5 on the right. 4. Again noted slightly displaced right C6 lamina fracture extending to the superior and inferior articular processes of C6, and nondisplaced fracture of the right superior C7 articular process, all of which were present on 10/25/2022. 5. 2 mm anterolisthesis of C6 on 7, first seen on 10/25/2022, not seen on 07/07/2022, likely a minimal traumatic listhesis related to the right C6 lamina and articular pillar fracture. 6. Osteopenia and degenerative change.  7. Multilevel degenerative spinal canal narrowing, greatest at C4-5 and C5-6 were there again appear to be right paracentral disc herniations impressing on the cord. No definitive canal hematoma. 8. Multifocal increased sinus disease, including with fluid levels in the right maxillary and right sphenoid sinuses. 9. Carotid atherosclerosis. 10. Partially visible, again noted comminuted slightly depressed left orbital floor fracture with extension to the anterior left maxillary sinus wall, and subacute posterolateral left maxillary sinus fracture. Electronically Signed   By: Almira Bar M.D.   On: 11/30/2022 20:37   DG Chest Portable 1 View  Result Date: 11/30/2022 CLINICAL DATA:  Weakness EXAM: PORTABLE CHEST 1 VIEW COMPARISON:  Chest x-ray dated October 25, 2022; chest CT dated October 26, 2022 FINDINGS: The heart size and mediastinal contours are within normal limits. New right suprahilar nodular opacity. Bilateral coarse interstitial opacities which are similar to prior exam. Old left-sided rib fracture. IMPRESSION: 1. New right suprahilar nodular opacity, favored to be due to infection given rapid interval development. Recommend radiographic follow-up to ensure resolution. 2. Bilateral coarse interstitial opacities which are similar to prior exam and compatible with fibrotic interstitial lung disease. Electronically Signed   By: Allegra Lai M.D.   On: 11/30/2022 19:26    Procedures Procedures   Medications Ordered in ED Medications  hydrogen peroxide 3 % external solution (  Not Given 11/30/22 1908)  acetaminophen (TYLENOL) tablet 650 mg (650 mg Oral Given 11/30/22 1826)  vancomycin (VANCOCIN) IVPB 1000 mg/200 mL premix (0 mg Intravenous Stopped 11/30/22 2119)  ceFEPIme (MAXIPIME) 2 g in sodium chloride 0.9 % 100 mL IVPB (0 g Intravenous Stopped 11/30/22 2152)    ED Course/ Medical Decision Making/ A&P                           Medical Decision Making Amount and/or Complexity of Data  Reviewed Radiology: ordered.  Risk OTC drugs. Prescription drug management.   This patient presents to the ED for concern of pneumonia, this involves an extensive number of treatment options, and is a complaint that carries with it a high risk of complications and morbidity.  The differential diagnosis includes intracranial bleed, viral URI, anemia, pulmonary embolism, pneumothorax   Co morbidities that complicate the patient evaluation  Afib, anemia, CHF, multiple fractures in cervical spine and chest   Lab Tests:  I Ordered, and personally interpreted labs.  The pertinent results include:  RSV positive, sodium 127, chloride 94   Imaging Studies ordered:  I  ordered imaging studies including chest xray  I independently visualized and interpreted imaging which showed suprahilar nodular opacity likely pneumonia I agree with the radiologist interpretation   Consultations Obtained:  I requested consultation with the hospitalist,  and discussed lab and imaging findings as well as pertinent plan - they recommend: inpatient admission   Problem List / ED Course / Critical interventions / Medication management  Cough, fall, weakness I ordered medication including tylenol, vancomycin, cefepime for fever, hospital acquired pneumonia  Reevaluation of the patient after these medicines showed that the patient was unchanged, afebrile I have reviewed the patients home medicines and have made adjustments as needed  Test / Admission - Considered:  Patient presented to the ED with a fall. She also had recent fall resulting in C6 and C7 fractures as well as orbital and maxillary sinus fractures. Patient did not report a head strike on fall today, but is unable to verbalized if she had a syncopal episode. Patient initially arrived febrile and meeting sepsis criteria so sepsis workup was initiated with chest xray showing likely hospital acquired pneumonia. Vancomycin and Cefepime were initiated  to treat this pneumonia and hospitalist, Dr. Allena Katz,  was consulted for inpatient admission for further treatment. Hospitalist was agreeable with plan for inpatient admission.   Final Clinical Impression(s) / ED Diagnoses Final diagnoses:  Hospital-acquired pneumonia  RSV (respiratory syncytial virus infection)    Rx / DC Orders ED Discharge Orders     None         Smitty Knudsen, PA-C 11/30/22 2254    Charlynne Pander, MD 11/30/22 2325

## 2022-12-01 ENCOUNTER — Encounter (HOSPITAL_COMMUNITY): Payer: Self-pay | Admitting: Internal Medicine

## 2022-12-01 DIAGNOSIS — A419 Sepsis, unspecified organism: Secondary | ICD-10-CM | POA: Diagnosis not present

## 2022-12-01 LAB — BASIC METABOLIC PANEL
Anion gap: 7 (ref 5–15)
BUN: 18 mg/dL (ref 8–23)
CO2: 24 mmol/L (ref 22–32)
Calcium: 8.3 mg/dL — ABNORMAL LOW (ref 8.9–10.3)
Chloride: 99 mmol/L (ref 98–111)
Creatinine, Ser: 0.78 mg/dL (ref 0.44–1.00)
GFR, Estimated: >60 mL/min (ref >60)
Glucose, Bld: 101 mg/dL — ABNORMAL HIGH (ref 70–99)
Potassium: 3.7 mmol/L (ref 3.5–5.1)
Sodium: 130 mmol/L — ABNORMAL LOW (ref 135–145)

## 2022-12-01 LAB — URINE CULTURE: Culture: NO GROWTH

## 2022-12-01 LAB — CBC
HCT: 35.1 % — ABNORMAL LOW (ref 36.0–46.0)
Hemoglobin: 11.5 g/dL — ABNORMAL LOW (ref 12.0–15.0)
MCH: 32.3 pg (ref 26.0–34.0)
MCHC: 32.8 g/dL (ref 30.0–36.0)
MCV: 98.6 fL (ref 80.0–100.0)
Platelets: 150 10*3/uL (ref 150–400)
RBC: 3.56 MIL/uL — ABNORMAL LOW (ref 3.87–5.11)
RDW: 15.5 % (ref 11.5–15.5)
WBC: 13.5 10*3/uL — ABNORMAL HIGH (ref 4.0–10.5)
nRBC: 0 % (ref 0.0–0.2)

## 2022-12-01 LAB — STREP PNEUMONIAE URINARY ANTIGEN: Strep Pneumo Urinary Antigen: NEGATIVE

## 2022-12-01 LAB — PROCALCITONIN: Procalcitonin: 0.39 ng/mL

## 2022-12-01 MED ORDER — GUAIFENESIN 100 MG/5ML PO LIQD
10.0000 mL | Freq: Four times a day (QID) | ORAL | Status: DC | PRN
  Administered 2022-12-01: 10 mL via ORAL
  Filled 2022-12-01: qty 10

## 2022-12-01 MED ORDER — IPRATROPIUM-ALBUTEROL 0.5-2.5 (3) MG/3ML IN SOLN
3.0000 mL | Freq: Four times a day (QID) | RESPIRATORY_TRACT | Status: DC
  Administered 2022-12-01: 3 mL via RESPIRATORY_TRACT
  Filled 2022-12-01: qty 3

## 2022-12-01 MED ORDER — IPRATROPIUM-ALBUTEROL 0.5-2.5 (3) MG/3ML IN SOLN
3.0000 mL | Freq: Three times a day (TID) | RESPIRATORY_TRACT | Status: DC
  Administered 2022-12-01 – 2022-12-02 (×2): 3 mL via RESPIRATORY_TRACT
  Filled 2022-12-01 (×2): qty 3

## 2022-12-01 MED ORDER — PREDNISONE 20 MG PO TABS
40.0000 mg | ORAL_TABLET | Freq: Every day | ORAL | Status: DC
  Administered 2022-12-02: 40 mg via ORAL
  Filled 2022-12-01: qty 2

## 2022-12-01 NOTE — TOC Initial Note (Signed)
Transition of Care [facility]) - Initial/Assessment Note    Patient Details  Name: [name] MRN: [id number] Date of Birth: 23-Dec-1927  Transition of Care [facility]) CM/SW Contact:    Angelita Ingles, RN Phone Number:[phone]  12/01/2022, 3:51 PM  Clinical Narrative:                  Transition of Care [facility]) Screening Note   Patient Details  Name: [name] Date of Birth: [birth year]   Transition of Care [facility]) CM/SW Contact:    Angelita Ingles, RN Phone Number: 12/01/2022, 3:51 PM    Transition of Care Department [facility]) has reviewed patient and no TOC needs have been identified at this time. We will continue to monitor patient advancement through interdisciplinary progression rounds. If new patient transition needs arise, please place a TOC consult.          Patient Goals and CMS Choice            Expected Discharge Plan and Services                                              Prior Living Arrangements/Services                       Activities of Daily Living Home Assistive Devices/Equipment: Gilford Rile (specify type), Built-in shower seat ADL Screening (condition at time of admission) Patient's cognitive ability adequate to safely complete daily activities?: Yes Is the patient deaf or have difficulty hearing?: Yes Does the patient have difficulty seeing, even when wearing glasses/contacts?: No Does the patient have difficulty concentrating, remembering, or making decisions?: Yes Patient able to express need for assistance with ADLs?: Yes Does the patient have difficulty dressing or bathing?: Yes Independently performs ADLs?: No Communication: Independent Dressing (OT): Needs assistance Is this a change from baseline?: Pre-admission baseline Grooming: Needs assistance Is this a change from baseline?: Pre-admission baseline Feeding: Needs assistance Is this a change from baseline?: Pre-admission baseline Bathing: Needs assistance Is  this a change from baseline?: Pre-admission baseline Toileting: Needs assistance Is this a change from baseline?: Pre-admission baseline In/Out Bed: Independent with device (comment) (walker) Walks in Home: Independent with device (comment) (walker) Does the patient have difficulty walking or climbing stairs?: Yes Weakness of Legs: Both Weakness of Arms/Hands: Both  Permission Sought/Granted                  Emotional Assessment              Admission diagnosis:  RSV (respiratory syncytial virus infection) [B33.8] Hospital-acquired pneumonia [J18.9, Y95] Sepsis ([place]) [A41.9] Patient Active Problem List   Diagnosis Date Noted   Sepsis ([place]) 11/30/2022   Community acquired pneumonia of right lung 11/30/2022   RSV (respiratory syncytial virus pneumonia) 11/30/2022   Closed C5 fracture ([place]) 11/30/2022   Hyponatremia 11/30/2022   Prediabetes 11/04/2022   Maxillary sinus fracture, sequela ([place]) 10/26/2022   Closed fracture of orbit with routine healing 10/25/2022   Closed C6 fracture ([place]) 10/25/2022   PAF (paroxysmal atrial fibrillation) ([place]) 09/08/2022   Acute CHF (congestive heart failure) ([place]) 08/26/2022   Acute hypoxemic respiratory failure ([place]) 08/26/2022   Hypertensive urgency 08/26/2022   Acute on chronic diastolic (congestive) heart failure ([place]) 08/26/2022   Syncope 06/02/2022   Orthostatic hypotension 06/02/2022   CAD (coronary artery disease) 06/02/2022  Statin myopathy 05/30/2021   Chronic combined systolic and diastolic CHF (congestive heart failure) (HCC) 06/07/2020   Chest pain 06/06/2020   Progressive angina (HCC) 05/24/2020   Unstable angina (HCC) 05/24/2020   Anemia 06/09/2013   Asthma 06/09/2013   Dyspnea and respiratory abnormality 06/09/2013   Psoriasis 01/31/2013   Essential hypertension 07/08/2012   Vitamin D deficiency 12/22/2011   Concussion 11/25/2011   Multiple fractures of ribs of left side 11/25/2011   Grade 2 splenic laceration  11/25/2011   GERD (gastroesophageal reflux disease) 11/25/2011   Atherosclerotic heart disease of native coronary artery without angina pectoris 05/28/2011   Hyperlipidemia 12/05/2008   ASTHMA, UNSPECIFIED 12/05/2008   Diverticulosis of colon 12/05/2008   HEMOCCULT POSITIVE STOOL 12/05/2008   Arthropathy 12/18/2006   Osteopenia 12/18/2006   PCP:  [name], NP Pharmacy:   [id number] - [place] - [address] [place] [address] Phone: [phone] Fax: [phone]  [facility]) - [place], [place] - [address] [place] [address] Phone: [phone] Fax: [phone]  [id number] - [place], [place] - [address] [place] [address] Phone: [phone] Fax: [phone]  [facility] - [place] - [address] [place] [address] [place] [address] Phone: [phone] Fax: [phone]  [facility] [address] [place] [address] Phone: [phone] Fax: [phone]     Social Determinants of Health (SDOH) Social History: SDOH Screenings   Food Insecurity: No Food Insecurity (12/01/2022)  Housing: Low Risk  (08/26/2022)  Transportation Needs: No Transportation Needs (08/26/2022)  Utilities: Not At Risk (12/01/2022)  Alcohol Screen: Low Risk  (08/26/2022)  Depression (PHQ2-9): Medium Risk (03/20/2022)  Financial Resource Strain: Low Risk  (08/26/2022)  Tobacco Use: Low Risk  (12/01/2022)   SDOH Interventions:     Readmission Risk Interventions     No data to display

## 2022-12-01 NOTE — Evaluation (Signed)
Occupational Therapy Evaluation Patient Details Name: [name] MRN: [id number] DOB: 04/10/1928 Today's Date: 12/01/2022   History of Present Illness Patient is a 89 year old female who presented after a fall with fever. Patient was found to have RSV, sepsis and pneumonia. Patient was noted to have acute nondisplaced fracture through right C5 lamia with imaging with C6/C7 fractures still present as well. JOA:CZYSAYT, maxillary, and C6/7 fractures. PAF, chronic combined systolic and diastolic CHF, CAD s/p DES, LBBB, chronic orthostasis, asthma   Clinical Impression   Patient is a 89 year old female who was admitted for above. Patient reported living at home with granddaughter. No family in room at this time with patient notably confused during session. Patient repeatedly referred to herself as being home even with education on orientation to hospital.  Patient would need 24/7 supervision and caregiver support in next level of care. Patient was noted to have decreased functional activity tolerance, decreased endurance, decreased standing balance, decreased safety awareness, and decreased knowledge of AD/AE impacting participation in ADLs. Patient would continue to benefit from skilled OT services at this time while admitted and after d/c to address noted deficits in order to improve overall safety and independence in ADLs.        Recommendations for follow up therapy are one component of a multi-disciplinary discharge planning process, led by the attending physician.  Recommendations may be updated based on patient status, additional functional criteria and insurance authorization.   Follow Up Recommendations  Home health OT     Assistance Recommended at Discharge Frequent or constant Supervision/Assistance  Patient can return home with the following A little help with walking and/or transfers;A lot of help with bathing/dressing/bathroom;Assistance with cooking/housework;Direct  supervision/assist for medications management;Assist for transportation;Help with stairs or ramp for entrance;Direct supervision/assist for financial management    Functional Status Assessment  Patient has had a recent decline in their functional status and demonstrates the ability to make significant improvements in function in a reasonable and predictable amount of time.  Equipment Recommendations  None recommended by OT    Recommendations for Other Services       Precautions / Restrictions Precautions Precautions: Fall Precaution Comments: Cervical, monitor HR Restrictions Weight Bearing Restrictions: No      Mobility Bed Mobility Overal bed mobility: Needs Assistance Bed Mobility: Supine to Sit, Sit to Supine     Supine to sit: Min assist Sit to supine: Min assist   General bed mobility comments: with cues to maintain precautions and avoid twisting        Balance Overall balance assessment: Mild deficits observed, not formally tested             ADL either performed or assessed with clinical judgement   ADL Overall ADL's : Needs assistance/impaired Eating/Feeding: NPO   Grooming: Wash/dry face;Min guard;Sitting Grooming Details (indicate cue type and reason): EOB with cues for safety with all tasks. patient has no awareness of safety precautions with cervical fractures. Upper Body Bathing: Moderate assistance;Bed level   Lower Body Bathing: Maximal assistance;Bed level   Upper Body Dressing : Moderate assistance;Bed level   Lower Body Dressing: Bed level;Maximal assistance   Toilet Transfer: Minimal assistance;Ambulation;Rolling walker (2 wheels) Toilet Transfer Details (indicate cue type and reason): patient was able to take a few side steps up head ob ed with RW with increased cues for sequencing and safety. Toileting- Clothing Manipulation and Hygiene: Maximal assistance;Sit to/from stand Toileting - Clothing Manipulation Details (indicate cue type  and reason): needed  BUE support for simulated balance tasks.              Pertinent Vitals/Pain Pain Assessment Pain Assessment: Faces Faces Pain Scale: No hurt     Hand Dominance Right   Extremity/Trunk Assessment Upper Extremity Assessment Upper Extremity Assessment: Difficult to assess due to impaired cognition (limited unable to test full ROM with cervical precautions and patients level of confusion.)   Lower Extremity Assessment Lower Extremity Assessment: Defer to PT evaluation       Communication Communication Communication: No difficulties   Cognition Arousal/Alertness: Awake/alert   Overall Cognitive Status: No family/caregiver present to determine baseline cognitive functioning           General Comments: "who let you in my house?'  patient knew day of week, febuary 2023                Home Living Family/patient expects to be discharged to:: Private residence Living Arrangements: Other relatives (granddaughter) Available Help at Discharge: Family;Available 24 hours/day Type of Home: House Home Access: Stairs to enter [employer] of Steps: 1 Entrance Stairs-Rails: None Home Layout: Laundry or work area in basement;Able to live on main level with bedroom/bathroom;Two level Alternate Level Stairs-Number of Steps: flight Alternate Level Stairs-Rails: Right Bathroom Shower/Tub: Occupational psychologist: Standard     Home Equipment: Conservation officer, nature (2 wheels);Cane - quad;Shower seat;Other (comment);BSC/3in1          Prior Functioning/Environment Prior Level of Function : Independent/Modified Independent               ADLs Comments: patient reported that she was doing her ADLs by herself and granddaughter stays with her. no family in room.        OT Problem List: Decreased activity tolerance;Impaired balance (sitting and/or standing);Decreased coordination;Decreased safety awareness;Decreased knowledge of  precautions;Decreased cognition      OT Treatment/Interventions: Self-care/ADL training;Energy conservation;Therapeutic exercise;DME and/or AE instruction;Therapeutic activities;Patient/family education;Balance training    OT Goals(Current goals can be found in the care plan section) Acute Rehab OT Goals Patient Stated Goal: none stated OT Goal Formulation: Patient unable to participate in goal setting Time For Goal Achievement: 12/15/22 Potential to Achieve Goals: Fair  OT Frequency: Min 2X/week       AM-PAC OT "6 Clicks" Daily Activity     Outcome Measure Help from another person eating meals?: A Little Help from another person taking care of personal grooming?: A Little Help from another person toileting, which includes using toliet, bedpan, or urinal?: A Lot Help from another person bathing (including washing, rinsing, drying)?: A Lot Help from another person to put on and taking off regular upper body clothing?: A Lot Help from another person to put on and taking off regular lower body clothing?: A Lot 6 Click Score: 14   End of Session Equipment Utilized During Treatment: Gait belt;Rolling walker (2 wheels) Nurse Communication: Other (comment) (ok to participate in session)  Activity Tolerance: Patient tolerated treatment well Patient left: in bed;with call bell/phone within reach;with bed alarm set  OT Visit Diagnosis: Unsteadiness on feet (R26.81);Other abnormalities of gait and mobility (R26.89);Muscle weakness (generalized) (M62.81)                Time: 3016-0109 OT Time Calculation (min): 16 min Charges:  OT General Charges $OT Visit: 1 Visit OT Evaluation $OT Eval Moderate Complexity: 1 Mod  Amelia Macken OTR/L, MS Acute Rehabilitation Department Office# [phone]   Willa Rough 12/01/2022, 4:25 PM

## 2022-12-01 NOTE — Progress Notes (Addendum)
PROGRESS NOTE  [name]  [id number] DOB: 11-21-1928 DOA: 11/30/2022 PCP: [name], NP   Brief Narrative: Patient is a 89 year old female with history of paroxysmal A-fib on Eliquis, combined systolic/diastolic CHF with EF of 30 to 35%, coronary artery disease status post DES, left bundle branch block, chronic orthostasis, hypothyroidism , hyperlipidemia, asthma, frequent falls who presented to the emergency department with a fall at home.  Patient was admitted in December  after a fall resulting in orbital, maxillary and C6/C7 fracture, has soft cervical spine collar in place.  Ambulates with walker at home.  Patient was also noted to have chills, rattling cough.  On presentation ,she was febrile but saturating fine on room air.  Labs showed WBC 5.5.  COVID/flu negative but RSV positive.  Chest x-ray showed new right suprahilar nodular opacity.  Assessment & Plan:  Principal Problem:   Sepsis ([place]) Active Problems:   Community acquired pneumonia of right lung   RSV (respiratory syncytial virus pneumonia)   Asthma   Chronic combined systolic and diastolic CHF (congestive heart failure) (HCC)   CAD (coronary artery disease)   PAF (paroxysmal atrial fibrillation) (HCC)   Closed fracture of orbit with routine healing   Closed C6 fracture (HCC)   Maxillary sinus fracture, sequela (HCC)   Closed C5 fracture (HCC)   Hyponatremia  Sepsis secondary to RSV viral infection with suspicion of superimposed pneumonia: Presented with leukocytosis, fever, tachypnea, tachycardia.  RSV positive.  Chest x-ray showed new right suprahilar opacity suspicious for superimposed bacterial pneumonia.  Currently on ceftriaxone, azithromycin.  Follow blood cultures.  She remains on room air.  Found to be wheezing today, will start on oral steroid.  Leukocytosis is improving.   Continue current medications for now.  See  Frequent falls/recent C6/C7 spinal fracture/orbital fracture/maxillary sinus  fracture/acute nondisplaced right C5 lamina fracture: She was admitted for this on last admission.  Supposed to follow-up with neurosurgery as an outpatient.  Continue pain management, supportive care.  Cervical collar in place.  PT/OT requested.  Denies any neck pain today.  Hyponatremia: Appeared slightly volume depleted on presentation.  Started on normal saline,now stopped.  Continue to monitor for now  Paroxysmal A-fib: Currently rate is controlled.  On amiodarone, Coreg, Eliquis.  Remains in normal sinus rhythm . patient has increased risks of fall and is on Eliquis/Plavix.   I  discussed with the great granddaughter today.  As per her, this was discussed on last admission also but her cardiologist wanted to continue both Eliquis and Plavix. We recommend to follow-up with cardiology again as well as PCP for discussion about the need of continuing these medications.  Chronic combined systolic/diastolic CHF: Appears hypovolemic in presentation, last EF of 30 to 35%.  Lasix on hold.  Started on gentle IV fluids on admission.  On Coreg, spironolactone  Coronary artery  disease: Status post DES.  Denies any chest pain.  On Plavix, Zetia         DVT prophylaxis:apixaban (ELIQUIS) tablet 2.5 mg Start: 12/01/22 1000 apixaban (ELIQUIS) tablet 2.5 mg     Code Status: DNR  Family Communication: Great granddaughter at bedside  Patient status:Inpatient  Patient is from :Home  Anticipated discharge NE:[id number]  Estimated DC date: Tomorrow   Consultants: None  Procedures: None  Antimicrobials:  Anti-infectives (From admission, onward)    Start     Dose/Rate Route Frequency Ordered Stop   12/01/22 1000  cefTRIAXone (ROCEPHIN) 2 g in sodium chloride 0.9 % 100 mL IVPB  2 g 200 mL/hr over 30 Minutes Intravenous Every 24 hours 11/30/22 2336 12/06/22 0959   11/30/22 2345  azithromycin (ZITHROMAX) 500 mg in sodium chloride 0.9 % 250 mL IVPB        500 mg 250 mL/hr over 60 Minutes  Intravenous Every 24 hours 11/30/22 2336 12/05/22 0200   11/30/22 2030  ceFEPIme (MAXIPIME) 2 g in sodium chloride 0.9 % 100 mL IVPB        2 g 200 mL/hr over 30 Minutes Intravenous  Once 11/30/22 2016 11/30/22 2152   11/30/22 2000  vancomycin (VANCOCIN) IVPB 1000 mg/200 mL premix        1,000 mg 200 mL/hr over 60 Minutes Intravenous  Once 11/30/22 1959 11/30/22 2119       Subjective: Patient seen and examined about today.  Hemodynamically stable.  Comfortable.  On room air.  Denies any shortness of breath or worsening cough.  Has c-collar, denies neck pain.  Objective: Vitals:   11/30/22 2230 11/30/22 2242 12/01/22 0048 12/01/22 0533  BP: 120/63 118/60 (!) 146/65 (!) 124/56  Pulse: 69 70 68 73  Resp: 16 19 14 14   Temp:  97.7 F (36.5 C) 98 F (36.7 C) (!) 100.5 F (38.1 C)  TempSrc:   Oral Oral  SpO2: 92% 93% 94% 90%  Weight:      Height:        Intake/Output Summary (Last 24 hours) at 12/01/2022 [id number] Last data filed at 11/30/2022 2152 Gross per 24 hour  Intake 291.65 ml  Output --  Net 291.65 ml   Filed Weights   11/30/22 1819  Weight: 51.3 kg    Examination:  General exam: Overall comfortable, not in distress, pleasant elderly female HEENT: PERRL, c-collar Respiratory system: Bilateral expiratory wheezing Cardiovascular system: S1 & S2 heard, RRR.  Gastrointestinal system: Abdomen is nondistended, soft and nontender. Central nervous system: Alert and oriented Extremities: No edema, no clubbing ,no cyanosis Skin: No rashes, no ulcers,no icterus     Data Reviewed: I have personally reviewed following labs and imaging studies  CBC: Recent Labs  Lab 11/30/22 1825 12/01/22 0640  WBC 15.5* 13.5*  NEUTROABS 13.3*  --   HGB 13.3 11.5*  HCT 40.4 35.1*  MCV 96.0 98.6  PLT 192 [id number]   Basic Metabolic Panel: Recent Labs  Lab 11/30/22 1825 12/01/22 0640  NA 127* 130*  K 4.2 3.7  CL 94* 99  CO2 25 24  GLUCOSE 144* 101*  BUN 19 18  CREATININE 0.96 0.78   CALCIUM 9.0 8.3*     Recent Results (from the past 240 hour(s))  Resp panel by RT-PCR (RSV, Flu A&B, Covid) Anterior Nasal Swab     Status: Abnormal   Collection Time: 11/30/22  6:29 PM   Specimen: Anterior Nasal Swab  Result Value Ref Range Status   SARS Coronavirus 2 by RT PCR NEGATIVE NEGATIVE Final    Comment: (NOTE) SARS-CoV-2 target nucleic acids are NOT DETECTED.  The SARS-CoV-2 RNA is generally detectable in upper respiratory specimens during the acute phase of infection. The lowest concentration of SARS-CoV-2 viral copies this assay can detect is 138 copies/mL. A negative result does not preclude SARS-Cov-2 infection and should not be used as the sole basis for treatment or other patient management decisions. A negative result may occur with  improper specimen collection/handling, submission of specimen other than nasopharyngeal swab, presence of viral mutation(s) within the areas targeted by this assay, and inadequate number of viral copies(<138 copies/mL). A negative result  must be combined with clinical observations, patient history, and epidemiological information. The expected result is Negative.  Fact Sheet for Patients:  EntrepreneurPulse.com.au  Fact Sheet for Healthcare Providers:  IncredibleEmployment.be  This test is no t yet approved or cleared by the [place] FDA and  has been authorized for detection and/or diagnosis of SARS-CoV-2 by FDA under an Emergency Use Authorization (EUA). This EUA will remain  in effect (meaning this test can be used) for the duration of the COVID-19 declaration under Section 564(b)(1) of the Act, 21 U.S.C.section 360bbb-3(b)(1), unless the authorization is terminated  or revoked sooner.       Influenza A by PCR NEGATIVE NEGATIVE Final   Influenza B by PCR NEGATIVE NEGATIVE Final    Comment: (NOTE) The Xpert Xpress SARS-CoV-2/FLU/RSV plus assay is intended as an aid in the  diagnosis of influenza from Nasopharyngeal swab specimens and should not be used as a sole basis for treatment. Nasal washings and aspirates are unacceptable for Xpert Xpress SARS-CoV-2/FLU/RSV testing.  Fact Sheet for Patients: EntrepreneurPulse.com.au  Fact Sheet for Healthcare Providers: IncredibleEmployment.be  This test is not yet approved or cleared by the [place] FDA and has been authorized for detection and/or diagnosis of SARS-CoV-2 by FDA under an Emergency Use Authorization (EUA). This EUA will remain in effect (meaning this test can be used) for the duration of the COVID-19 declaration under Section 564(b)(1) of the Act, 21 U.S.C. section 360bbb-3(b)(1), unless the authorization is terminated or revoked.     Resp Syncytial Virus by PCR POSITIVE (A) NEGATIVE Final    Comment: (NOTE) Fact Sheet for Patients: EntrepreneurPulse.com.au  Fact Sheet for Healthcare Providers: IncredibleEmployment.be  This test is not yet approved or cleared by the [place] FDA and has been authorized for detection and/or diagnosis of SARS-CoV-2 by FDA under an Emergency Use Authorization (EUA). This EUA will remain in effect (meaning this test can be used) for the duration of the COVID-19 declaration under Section 564(b)(1) of the Act, 21 U.S.C. section 360bbb-3(b)(1), unless the authorization is terminated or revoked.  Performed at [facility], [place] [address], [place], [place] [address]   Blood culture (routine x 2)     Status: None (Preliminary result)   Collection Time: 11/30/22  7:00 PM   Specimen: BLOOD  Result Value Ref Range Status   Specimen Description   Final    BLOOD BLOOD RIGHT ARM Performed at [facility] [address] [place], [place] [address]    Special Requests   Final    BOTTLES DRAWN AEROBIC AND ANAEROBIC Blood Culture results may not be  optimal due to an inadequate volume of blood received in culture bottles Performed at [facility] [address], [place], [place] [address]    Culture   Final    NO GROWTH < 12 HOURS Performed at [facility] [address] [place], [place] [address]    Report Status PENDING  Incomplete     Radiology Studies: CT HEAD WO CONTRAST (5MM)  Result Date: 11/30/2022 CLINICAL DATA:  Head and neck trauma. 89 year old female. Check for intracranial venous injury. EXAM: CT HEAD WITHOUT CONTRAST CT CERVICAL SPINE WITHOUT CONTRAST TECHNIQUE: Multidetector CT imaging of the head and cervical spine was performed following the standard protocol without intravenous contrast. Multiplanar CT image reconstructions of the cervical spine were also generated. RADIATION DOSE REDUCTION: This exam was performed according to the departmental dose-optimization program which includes automated exposure control, adjustment of the  mA and/or kV according to patient size and/or use of iterative reconstruction technique. COMPARISON:  CT scan head and cervical spine both 10/25/2022. CT scan cervical spine 07/07/2022. FINDINGS: CT HEAD FINDINGS Brain: There is moderately advanced atrophy, small-vessel disease and atrophic ventriculomegaly. No recent infarct, acute hemorrhage or mass is seen. There is no midline shift. Basal cisterns are patent. Vascular: The distal vertebral arteries and both siphons are heavily calcified. No hyperdense central vessel. Skull: No fracture or focal lesion is seen. Sinuses/Orbits: Again noted as on 10/25/2022, is a comminuted mildly depressed fracture of the left orbital floor extending into the anterior wall of left maxillary sinus, only partially visible. There is again noted a slightly displaced fracture of posterolateral left maxillary sinus wall. No asymmetry of the globes is seen, with bilateral old lens replacements. Interval resolution of previous left maxillary sinus  hemorrhagic fluid level. There is increased opacification of the bilateral ethmoid air cells, with fluid and increased membrane thickening in the right maxillary and right sphenoid sinus. The fluid is low in density and does not appear compatible with the hemorrhagic material. The frontal and sphenoid sinus, bilateral mastoid air cells and middle ears are clear. Other: None. CT CERVICAL SPINE FINDINGS Alignment: There is a 2 mm anterolisthesis of C6 on 7, first seen on 10/25/2022, not seen on 07/07/2022, likely a minimal traumatic listhesis, related to an again noted fracture of the right C6 lamina and right C6 articular pillar. There is a chronic 2 mm degenerative anterolisthesis at C7-T1. Other cervical alignment is unremarkable. Skull base and vertebrae: There is new demonstration of an acute nondisplaced fracture through the right C5 lamina with extension to the base of the superior articular process of C5 on the right. Again noted is a slightly displaced right C6 lamina fracture extending to the superior and inferior articular process of C6 on the right, and a nondisplaced fracture of the right superior C7 articular process, all of which were present on 10/25/2022. No interval healing is seen. Osteopenia is present without other pathologic process or appreciable further fractures. There is ankylosis across the right C3-4 and the bilateral T1-2 facet joints, as before. Soft tissues and spinal canal, discs: Spinal canal is moderately narrowed due to degenerative changes, particularly C4-5 and C5-6 where there appear to be significant right paracentral disc protrusions impressing on the cord. This was seen previously. No canal hematoma is seen on this limited exam. The discs are diffusely degenerated, with narrowing and spurring again at the anterior atlantodental joint and multilevel degenerative foraminal stenosis, facet and uncinate hypertrophy. Upper chest: There is biapical pleural-parenchymal scarring. No  acute upper abdominal findings. Other: There is bilateral carotid atherosclerosis, heaviest in the proximal left cervical ICA. IMPRESSION: 1. No acute intracranial CT findings or depressed skull fractures. 2. Atrophy, small-vessel disease and intracranial atherosclerosis. 3. Acute nondisplaced fracture through the right C5 lamina with extension to the base of the superior articular process of C5 on the right. 4. Again noted slightly displaced right C6 lamina fracture extending to the superior and inferior articular processes of C6, and nondisplaced fracture of the right superior C7 articular process, all of which were present on 10/25/2022. 5. 2 mm anterolisthesis of C6 on 7, first seen on 10/25/2022, not seen on 07/07/2022, likely a minimal traumatic listhesis related to the right C6 lamina and articular pillar fracture. 6. Osteopenia and degenerative change. 7. Multilevel degenerative spinal canal narrowing, greatest at C4-5 and C5-6 were there again appear to be right paracentral disc herniations  impressing on the cord. No definitive canal hematoma. 8. Multifocal increased sinus disease, including with fluid levels in the right maxillary and right sphenoid sinuses. 9. Carotid atherosclerosis. 10. Partially visible, again noted comminuted slightly depressed left orbital floor fracture with extension to the anterior left maxillary sinus wall, and subacute posterolateral left maxillary sinus fracture. Electronically Signed   By: Telford Nab M.D.   On: 11/30/2022 20:37   CT Cervical Spine Wo Contrast  Result Date: 11/30/2022 CLINICAL DATA:  Head and neck trauma. 89 year old female. Check for intracranial venous injury. EXAM: CT HEAD WITHOUT CONTRAST CT CERVICAL SPINE WITHOUT CONTRAST TECHNIQUE: Multidetector CT imaging of the head and cervical spine was performed following the standard protocol without intravenous contrast. Multiplanar CT image reconstructions of the cervical spine were also generated.  RADIATION DOSE REDUCTION: This exam was performed according to the departmental dose-optimization program which includes automated exposure control, adjustment of the mA and/or kV according to patient size and/or use of iterative reconstruction technique. COMPARISON:  CT scan head and cervical spine both 10/25/2022. CT scan cervical spine 07/07/2022. FINDINGS: CT HEAD FINDINGS Brain: There is moderately advanced atrophy, small-vessel disease and atrophic ventriculomegaly. No recent infarct, acute hemorrhage or mass is seen. There is no midline shift. Basal cisterns are patent. Vascular: The distal vertebral arteries and both siphons are heavily calcified. No hyperdense central vessel. Skull: No fracture or focal lesion is seen. Sinuses/Orbits: Again noted as on 10/25/2022, is a comminuted mildly depressed fracture of the left orbital floor extending into the anterior wall of left maxillary sinus, only partially visible. There is again noted a slightly displaced fracture of posterolateral left maxillary sinus wall. No asymmetry of the globes is seen, with bilateral old lens replacements. Interval resolution of previous left maxillary sinus hemorrhagic fluid level. There is increased opacification of the bilateral ethmoid air cells, with fluid and increased membrane thickening in the right maxillary and right sphenoid sinus. The fluid is low in density and does not appear compatible with the hemorrhagic material. The frontal and sphenoid sinus, bilateral mastoid air cells and middle ears are clear. Other: None. CT CERVICAL SPINE FINDINGS Alignment: There is a 2 mm anterolisthesis of C6 on 7, first seen on 10/25/2022, not seen on 07/07/2022, likely a minimal traumatic listhesis, related to an again noted fracture of the right C6 lamina and right C6 articular pillar. There is a chronic 2 mm degenerative anterolisthesis at C7-T1. Other cervical alignment is unremarkable. Skull base and vertebrae: There is new  demonstration of an acute nondisplaced fracture through the right C5 lamina with extension to the base of the superior articular process of C5 on the right. Again noted is a slightly displaced right C6 lamina fracture extending to the superior and inferior articular process of C6 on the right, and a nondisplaced fracture of the right superior C7 articular process, all of which were present on 10/25/2022. No interval healing is seen. Osteopenia is present without other pathologic process or appreciable further fractures. There is ankylosis across the right C3-4 and the bilateral T1-2 facet joints, as before. Soft tissues and spinal canal, discs: Spinal canal is moderately narrowed due to degenerative changes, particularly C4-5 and C5-6 where there appear to be significant right paracentral disc protrusions impressing on the cord. This was seen previously. No canal hematoma is seen on this limited exam. The discs are diffusely degenerated, with narrowing and spurring again at the anterior atlantodental joint and multilevel degenerative foraminal stenosis, facet and uncinate hypertrophy. Upper chest: There is  biapical pleural-parenchymal scarring. No acute upper abdominal findings. Other: There is bilateral carotid atherosclerosis, heaviest in the proximal left cervical ICA. IMPRESSION: 1. No acute intracranial CT findings or depressed skull fractures. 2. Atrophy, small-vessel disease and intracranial atherosclerosis. 3. Acute nondisplaced fracture through the right C5 lamina with extension to the base of the superior articular process of C5 on the right. 4. Again noted slightly displaced right C6 lamina fracture extending to the superior and inferior articular processes of C6, and nondisplaced fracture of the right superior C7 articular process, all of which were present on 10/25/2022. 5. 2 mm anterolisthesis of C6 on 7, first seen on 10/25/2022, not seen on 07/07/2022, likely a minimal traumatic listhesis related to  the right C6 lamina and articular pillar fracture. 6. Osteopenia and degenerative change. 7. Multilevel degenerative spinal canal narrowing, greatest at C4-5 and C5-6 were there again appear to be right paracentral disc herniations impressing on the cord. No definitive canal hematoma. 8. Multifocal increased sinus disease, including with fluid levels in the right maxillary and right sphenoid sinuses. 9. Carotid atherosclerosis. 10. Partially visible, again noted comminuted slightly depressed left orbital floor fracture with extension to the anterior left maxillary sinus wall, and subacute posterolateral left maxillary sinus fracture. Electronically Signed   By: Almira Bar M.D.   On: 11/30/2022 20:37   DG Chest Portable 1 View  Result Date: 11/30/2022 CLINICAL DATA:  Weakness EXAM: PORTABLE CHEST 1 VIEW COMPARISON:  Chest x-ray dated October 25, 2022; chest CT dated October 26, 2022 FINDINGS: The heart size and mediastinal contours are within normal limits. New right suprahilar nodular opacity. Bilateral coarse interstitial opacities which are similar to prior exam. Old left-sided rib fracture. IMPRESSION: 1. New right suprahilar nodular opacity, favored to be due to infection given rapid interval development. Recommend radiographic follow-up to ensure resolution. 2. Bilateral coarse interstitial opacities which are similar to prior exam and compatible with fibrotic interstitial lung disease. Electronically Signed   By: Allegra Lai M.D.   On: 11/30/2022 19:26    Scheduled Meds:  amiodarone  100 mg Oral Daily   apixaban  2.5 mg Oral BID   carvedilol  3.125 mg Oral BID WC   clopidogrel  75 mg Oral Daily   ezetimibe  10 mg Oral Daily   sodium chloride flush  3 mL Intravenous Q12H   spironolactone  25 mg Oral Daily   Continuous Infusions:  sodium chloride 100 mL/hr at 12/01/22 0100   azithromycin 500 mg (12/01/22 0154)   cefTRIAXone (ROCEPHIN)  IV       LOS: 1 day   Burnadette Pop,  MD [facility] P1/07/2023, 8:26 AM

## 2022-12-02 ENCOUNTER — Other Ambulatory Visit (HOSPITAL_COMMUNITY): Payer: Self-pay

## 2022-12-02 DIAGNOSIS — A419 Sepsis, unspecified organism: Secondary | ICD-10-CM | POA: Diagnosis not present

## 2022-12-02 LAB — BASIC METABOLIC PANEL
Anion gap: 6 (ref 5–15)
BUN: 15 mg/dL (ref 8–23)
CO2: 26 mmol/L (ref 22–32)
Calcium: 8.8 mg/dL — ABNORMAL LOW (ref 8.9–10.3)
Chloride: 99 mmol/L (ref 98–111)
Creatinine, Ser: 0.79 mg/dL (ref 0.44–1.00)
GFR, Estimated: >60 mL/min (ref >60)
Glucose, Bld: 98 mg/dL (ref 70–99)
Potassium: 3.8 mmol/L (ref 3.5–5.1)
Sodium: 131 mmol/L — ABNORMAL LOW (ref 135–145)

## 2022-12-02 LAB — CBC
HCT: 35.3 % — ABNORMAL LOW (ref 36.0–46.0)
Hemoglobin: 11.4 g/dL — ABNORMAL LOW (ref 12.0–15.0)
MCH: 31.6 pg (ref 26.0–34.0)
MCHC: 32.3 g/dL (ref 30.0–36.0)
MCV: 97.8 fL (ref 80.0–100.0)
Platelets: 147 10*3/uL — ABNORMAL LOW (ref 150–400)
RBC: 3.61 MIL/uL — ABNORMAL LOW (ref 3.87–5.11)
RDW: 15.5 % (ref 11.5–15.5)
WBC: 9.5 10*3/uL (ref 4.0–10.5)
nRBC: 0 % (ref 0.0–0.2)

## 2022-12-02 MED ORDER — GUAIFENESIN 100 MG/5ML PO LIQD: 10.0000 mL | Freq: Four times a day (QID) | ORAL | 0 refills | Status: DC | PRN

## 2022-12-02 MED ORDER — IPRATROPIUM-ALBUTEROL 0.5-2.5 (3) MG/3ML IN SOLN: 3.0000 mL | Freq: Three times a day (TID) | RESPIRATORY_TRACT | 0 refills | Status: DC

## 2022-12-02 MED ORDER — IPRATROPIUM-ALBUTEROL 0.5-2.5 (3) MG/3ML IN SOLN: 3.0000 mL | Freq: Two times a day (BID) | RESPIRATORY_TRACT | Status: DC

## 2022-12-02 MED ORDER — PREDNISONE 20 MG PO TABS: 40.0000 mg | ORAL_TABLET | Freq: Every day | ORAL | 0 refills | Status: AC

## 2022-12-02 MED ORDER — CEFDINIR 300 MG PO CAPS: 300.0000 mg | ORAL_CAPSULE | Freq: Two times a day (BID) | ORAL | 0 refills | Status: AC

## 2022-12-02 MED ORDER — AZITHROMYCIN 500 MG PO TABS: 500.0000 mg | ORAL_TABLET | Freq: Every day | ORAL | 0 refills | Status: AC

## 2022-12-02 NOTE — Plan of Care (Signed)
  Problem: Clinical Measurements: Goal: Signs and symptoms of infection will decrease Outcome: Progressing   Problem: Education: Goal: Knowledge of General Education information will improve Description: Including pain rating scale, medication(s)/side effects and non-pharmacologic comfort measures Outcome: Progressing   Problem: Nutrition: Goal: Adequate nutrition will be maintained Outcome: Progressing   Problem: Coping: Goal: Level of anxiety will decrease Outcome: Progressing   Problem: Pain Managment: Goal: General experience of comfort will improve Outcome: Progressing   Problem: Safety: Goal: Ability to remain free from injury will improve Outcome: Progressing   Problem: Skin Integrity: Goal: Risk for impaired skin integrity will decrease Outcome: Progressing

## 2022-12-02 NOTE — Discharge Summary (Signed)
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  Patient was admitted in December  after a fall resulting in orbital, maxillary and C6/C7 fracture, has soft cervical spine collar in place.  Ambulates with walker at home.  Patient was also noted to have chills, rattling cough.  On presentation ,she was febrile but saturating fine on room air.  Labs showed WBC 5.5.  COVID/flu negative but RSV positive.  Chest x-ray showed new right suprahilar nodular opacity.  Patient was started on antibiotics.  Currently respiratory status is stable.  She is on room air.  She is medically stable for discharge home today.  Following problems were addressed during the hospitalization:  Sepsis secondary to RSV viral infection with suspicion of superimposed pneumonia: Presented with leukocytosis, fever, tachypnea, tachycardia.  RSV positive.  Chest x-ray showed new right suprahilar opacity suspicious for superimposed bacterial pneumonia.  Currently on ceftriaxone, azithromycin. Changed to oral on DC.Negative  blood cultures.  She remains on room air.  Found to be wheezing so started on oral steroid.  Leukocytosis is improving.    Frequent falls/recent C6/C7 spinal fracture/orbital fracture/maxillary sinus fracture/acute nondisplaced right C5 lamina fracture: She was admitted for this on last admission.  Supposed to  follow-up with neurosurgery as an outpatient.  Continue pain management, supportive care.  Cervical collar in place.  PT/OT requested.  Denies any neck pain today.   Hyponatremia: Appeared slightly volume depleted on presentation.  Started on normal saline,now stopped. Stable at 131   Paroxysmal A-fib: Currently rate is controlled.  On amiodarone, Coreg, Eliquis.  Remains in normal sinus rhythm . patient has increased risks of fall and is on Eliquis/Plavix.   I  discussed with the great granddaughter today.  As per her, this was discussed on last admission also but her cardiologist wanted to continue both Eliquis and Plavix. We recommend to follow-up with cardiology again as well as PCP for discussion about the need of continuing these medications.   Chronic combined systolic/diastolic CHF: Appears hypovolemic in presentation, last EF of 30 to 35%. On lasix as needed at home.    On Coreg, spironolactone   Coronary artery  disease: Status post DES.  Denies any chest pain.  On Plavix, Zetia    Discharge Diagnoses:  Principal Problem:   Sepsis (HCC) Active Problems:   Community acquired pneumonia of right lung   RSV (respiratory syncytial virus pneumonia)   Asthma   Chronic combined systolic and diastolic CHF (congestive heart failure) (HCC)   CAD (coronary artery disease)   PAF (paroxysmal atrial fibrillation) (HCC)   Closed fracture of orbit with routine healing   Closed C6 fracture (HCC)   Maxillary sinus fracture, sequela (HCC)   Closed C5 fracture (HCC)   Hyponatremia    Discharge Instructions  Discharge Instructions     Diet - low sodium heart healthy   Complete by: As directed    Discharge instructions   Complete by: As directed    1)Please take prescribed medications as  instructed 2)Follow up with your PCP in a week 3)Follow up with neurosurgery 4)Discuss with your cardiologist and  PCP about  the need of continuation of both plavix and Eliquis   Increase activity  slowly   Complete by: As directed       Allergies as of 12/02/2022       Reactions   Codeine Cough   Crestor [rosuvastatin Calcium] Cough   Entresto [sacubitril-valsartan]    cough   Lisinopril Cough   Pantoprazole Sodium Cough   Pravastatin Cough   Simvastatin Cough        Medication List     TAKE these medications    acetaminophen 325 MG tablet Commonly known as: TYLENOL Take 2 tablets (650 mg total) by mouth every 6 (six) hours as needed for mild pain (or Fever >/= 101).   albuterol 108 (90 Base) MCG/ACT inhaler Commonly known as: VENTOLIN HFA Inhale 1-2 puffs into the lungs every 6 (six) hours as needed for wheezing or shortness of breath.   amiodarone 200 MG tablet Commonly known as: Pacerone Take 1/2 tablet by mouth daily What changed:  how much to take how to take this when to take this   apixaban 2.5 MG Tabs tablet Commonly known as: ELIQUIS Take 1 tablet (2.5 mg total) by mouth 2 (two) times daily.   azithromycin 500 MG tablet Commonly known as: Zithromax Take 1 tablet (500 mg total) by mouth daily for 3 days. Take 1 tablet daily for 3 days.   carvedilol 3.125 MG tablet Commonly known as: COREG Take 1 tablet (3.125 mg total) by mouth 2 (two) times daily with a meal.   cefdinir 300 MG capsule Commonly known as: OMNICEF Take 1 capsule (300 mg total) by mouth 2 (two) times daily for 3 days.   clopidogrel 75 MG tablet Commonly known as: PLAVIX Take 75 mg by mouth daily.   CORICIDIN HBP COLD/COUGH/FLU PO Take 1 tablet by mouth every 6 (six) hours as needed (cold symptoms).   ezetimibe 10 MG tablet Commonly known as: ZETIA TAKE 1 TABLET BY MOUTH  DAILY   furosemide 20 MG tablet Commonly known as: LASIX Take 1 tablet by mouth three times per week What changed:  how much to take how to take this when to take this additional instructions   guaiFENesin 100 MG/5ML liquid Commonly known as: ROBITUSSIN Take 10 mLs by mouth every 6 (six) hours  as needed for cough or to loosen phlegm.   ipratropium-albuterol 0.5-2.5 (3) MG/3ML Soln Commonly known as: DUONEB Take 3 mLs by nebulization 3 (three) times daily.   predniSONE 20 MG tablet Commonly known as: DELTASONE Take 2 tablets (40 mg total) by mouth daily with breakfast for 4 days. Start taking on: December 03, 2022   PRESERVISION AREDS 2+MULTI VIT PO Take 1 capsule by mouth in the morning and at bedtime.   spironolactone 25 MG tablet Commonly known as: ALDACTONE Take 1 tablet (25 mg total) by mouth daily.        Follow-up Information     Nche, Bonna Gains, NP. Schedule an appointment as soon as possible for a visit in 1 week(s).   Specialty: Internal Medicine Contact information: [address] [place] [address] [phone]                Allergies  Allergen Reactions   Codeine Cough   Crestor [Rosuvastatin Calcium] Cough   Entresto [Sacubitril-Valsartan]     cough   Lisinopril Cough   Pantoprazole  Sodium Cough   Pravastatin Cough   Simvastatin Cough    Consultations: None   Procedures/Studies: CT HEAD WO CONTRAST (5MM)  Result Date: 11/30/2022 CLINICAL DATA:  Head and neck trauma. 89 year old female. Check for intracranial venous injury. EXAM: CT HEAD WITHOUT CONTRAST CT CERVICAL SPINE WITHOUT CONTRAST TECHNIQUE: Multidetector CT imaging of the head and cervical spine was performed following the standard protocol without intravenous contrast. Multiplanar CT image reconstructions of the cervical spine were also generated. RADIATION DOSE REDUCTION: This exam was performed according to the departmental dose-optimization program which includes automated exposure control, adjustment of the mA and/or kV according to patient size and/or use of iterative reconstruction technique. COMPARISON:  CT scan head and cervical spine both 10/25/2022. CT scan cervical spine 07/07/2022. FINDINGS: CT HEAD FINDINGS Brain: There is moderately advanced  atrophy, small-vessel disease and atrophic ventriculomegaly. No recent infarct, acute hemorrhage or mass is seen. There is no midline shift. Basal cisterns are patent. Vascular: The distal vertebral arteries and both siphons are heavily calcified. No hyperdense central vessel. Skull: No fracture or focal lesion is seen. Sinuses/Orbits: Again noted as on 10/25/2022, is a comminuted mildly depressed fracture of the left orbital floor extending into the anterior wall of left maxillary sinus, only partially visible. There is again noted a slightly displaced fracture of posterolateral left maxillary sinus wall. No asymmetry of the globes is seen, with bilateral old lens replacements. Interval resolution of previous left maxillary sinus hemorrhagic fluid level. There is increased opacification of the bilateral ethmoid air cells, with fluid and increased membrane thickening in the right maxillary and right sphenoid sinus. The fluid is low in density and does not appear compatible with the hemorrhagic material. The frontal and sphenoid sinus, bilateral mastoid air cells and middle ears are clear. Other: None. CT CERVICAL SPINE FINDINGS Alignment: There is a 2 mm anterolisthesis of C6 on 7, first seen on 10/25/2022, not seen on 07/07/2022, likely a minimal traumatic listhesis, related to an again noted fracture of the right C6 lamina and right C6 articular pillar. There is a chronic 2 mm degenerative anterolisthesis at C7-T1. Other cervical alignment is unremarkable. Skull base and vertebrae: There is new demonstration of an acute nondisplaced fracture through the right C5 lamina with extension to the base of the superior articular process of C5 on the right. Again noted is a slightly displaced right C6 lamina fracture extending to the superior and inferior articular process of C6 on the right, and a nondisplaced fracture of the right superior C7 articular process, all of which were present on 10/25/2022. No interval healing  is seen. Osteopenia is present without other pathologic process or appreciable further fractures. There is ankylosis across the right C3-4 and the bilateral T1-2 facet joints, as before. Soft tissues and spinal canal, discs: Spinal canal is moderately narrowed due to degenerative changes, particularly C4-5 and C5-6 where there appear to be significant right paracentral disc protrusions impressing on the cord. This was seen previously. No canal hematoma is seen on this limited exam. The discs are diffusely degenerated, with narrowing and spurring again at the anterior atlantodental joint and multilevel degenerative foraminal stenosis, facet and uncinate hypertrophy. Upper chest: There is biapical pleural-parenchymal scarring. No acute upper abdominal findings. Other: There is bilateral carotid atherosclerosis, heaviest in the proximal left cervical ICA. IMPRESSION: 1. No acute intracranial CT findings or depressed skull fractures. 2. Atrophy, small-vessel disease and intracranial atherosclerosis. 3. Acute nondisplaced fracture through the right C5 lamina with extension to the base of  the superior articular process of C5 on the right. 4. Again noted slightly displaced right C6 lamina fracture extending to the superior and inferior articular processes of C6, and nondisplaced fracture of the right superior C7 articular process, all of which were present on 10/25/2022. 5. 2 mm anterolisthesis of C6 on 7, first seen on 10/25/2022, not seen on 07/07/2022, likely a minimal traumatic listhesis related to the right C6 lamina and articular pillar fracture. 6. Osteopenia and degenerative change. 7. Multilevel degenerative spinal canal narrowing, greatest at C4-5 and C5-6 were there again appear to be right paracentral disc herniations impressing on the cord. No definitive canal hematoma. 8. Multifocal increased sinus disease, including with fluid levels in the right maxillary and right sphenoid sinuses. 9. Carotid  atherosclerosis. 10. Partially visible, again noted comminuted slightly depressed left orbital floor fracture with extension to the anterior left maxillary sinus wall, and subacute posterolateral left maxillary sinus fracture. Electronically Signed   By: Almira Bar M.D.   On: 11/30/2022 20:37   CT Cervical Spine Wo Contrast  Result Date: 11/30/2022 CLINICAL DATA:  Head and neck trauma. 89 year old female. Check for intracranial venous injury. EXAM: CT HEAD WITHOUT CONTRAST CT CERVICAL SPINE WITHOUT CONTRAST TECHNIQUE: Multidetector CT imaging of the head and cervical spine was performed following the standard protocol without intravenous contrast. Multiplanar CT image reconstructions of the cervical spine were also generated. RADIATION DOSE REDUCTION: This exam was performed according to the departmental dose-optimization program which includes automated exposure control, adjustment of the mA and/or kV according to patient size and/or use of iterative reconstruction technique. COMPARISON:  CT scan head and cervical spine both 10/25/2022. CT scan cervical spine 07/07/2022. FINDINGS: CT HEAD FINDINGS Brain: There is moderately advanced atrophy, small-vessel disease and atrophic ventriculomegaly. No recent infarct, acute hemorrhage or mass is seen. There is no midline shift. Basal cisterns are patent. Vascular: The distal vertebral arteries and both siphons are heavily calcified. No hyperdense central vessel. Skull: No fracture or focal lesion is seen. Sinuses/Orbits: Again noted as on 10/25/2022, is a comminuted mildly depressed fracture of the left orbital floor extending into the anterior wall of left maxillary sinus, only partially visible. There is again noted a slightly displaced fracture of posterolateral left maxillary sinus wall. No asymmetry of the globes is seen, with bilateral old lens replacements. Interval resolution of previous left maxillary sinus hemorrhagic fluid level. There is increased  opacification of the bilateral ethmoid air cells, with fluid and increased membrane thickening in the right maxillary and right sphenoid sinus. The fluid is low in density and does not appear compatible with the hemorrhagic material. The frontal and sphenoid sinus, bilateral mastoid air cells and middle ears are clear. Other: None. CT CERVICAL SPINE FINDINGS Alignment: There is a 2 mm anterolisthesis of C6 on 7, first seen on 10/25/2022, not seen on 07/07/2022, likely a minimal traumatic listhesis, related to an again noted fracture of the right C6 lamina and right C6 articular pillar. There is a chronic 2 mm degenerative anterolisthesis at C7-T1. Other cervical alignment is unremarkable. Skull base and vertebrae: There is new demonstration of an acute nondisplaced fracture through the right C5 lamina with extension to the base of the superior articular process of C5 on the right. Again noted is a slightly displaced right C6 lamina fracture extending to the superior and inferior articular process of C6 on the right, and a nondisplaced fracture of the right superior C7 articular process, all of which were present on 10/25/2022. No interval healing  is seen. Osteopenia is present without other pathologic process or appreciable further fractures. There is ankylosis across the right C3-4 and the bilateral T1-2 facet joints, as before. Soft tissues and spinal canal, discs: Spinal canal is moderately narrowed due to degenerative changes, particularly C4-5 and C5-6 where there appear to be significant right paracentral disc protrusions impressing on the cord. This was seen previously. No canal hematoma is seen on this limited exam. The discs are diffusely degenerated, with narrowing and spurring again at the anterior atlantodental joint and multilevel degenerative foraminal stenosis, facet and uncinate hypertrophy. Upper chest: There is biapical pleural-parenchymal scarring. No acute upper abdominal findings. Other: There  is bilateral carotid atherosclerosis, heaviest in the proximal left cervical ICA. IMPRESSION: 1. No acute intracranial CT findings or depressed skull fractures. 2. Atrophy, small-vessel disease and intracranial atherosclerosis. 3. Acute nondisplaced fracture through the right C5 lamina with extension to the base of the superior articular process of C5 on the right. 4. Again noted slightly displaced right C6 lamina fracture extending to the superior and inferior articular processes of C6, and nondisplaced fracture of the right superior C7 articular process, all of which were present on 10/25/2022. 5. 2 mm anterolisthesis of C6 on 7, first seen on 10/25/2022, not seen on 07/07/2022, likely a minimal traumatic listhesis related to the right C6 lamina and articular pillar fracture. 6. Osteopenia and degenerative change. 7. Multilevel degenerative spinal canal narrowing, greatest at C4-5 and C5-6 were there again appear to be right paracentral disc herniations impressing on the cord. No definitive canal hematoma. 8. Multifocal increased sinus disease, including with fluid levels in the right maxillary and right sphenoid sinuses. 9. Carotid atherosclerosis. 10. Partially visible, again noted comminuted slightly depressed left orbital floor fracture with extension to the anterior left maxillary sinus wall, and subacute posterolateral left maxillary sinus fracture. Electronically Signed   By: Almira Bar M.D.   On: 11/30/2022 20:37   DG Chest Portable 1 View  Result Date: 11/30/2022 CLINICAL DATA:  Weakness EXAM: PORTABLE CHEST 1 VIEW COMPARISON:  Chest x-ray dated October 25, 2022; chest CT dated October 26, 2022 FINDINGS: The heart size and mediastinal contours are within normal limits. New right suprahilar nodular opacity. Bilateral coarse interstitial opacities which are similar to prior exam. Old left-sided rib fracture. IMPRESSION: 1. New right suprahilar nodular opacity, favored to be due to infection given  rapid interval development. Recommend radiographic follow-up to ensure resolution. 2. Bilateral coarse interstitial opacities which are similar to prior exam and compatible with fibrotic interstitial lung disease. Electronically Signed   By: Allegra Lai M.D.   On: 11/30/2022 19:26   LONG TERM MONITOR (3-14 DAYS)  Result Date: 11/24/2022   Predominate rhythm is sinus rhythm.   occasional sinus tachycardia and sinus bradycardia   Rate episodes of nonsustained Supraventricular tachycardia   Rare PVCs   No serious arrhythmias present Patch Wear Time:  13 days and 23 hours (2023-12-11T10:45:17-0500 to 2023-12-25T10:45:07-499) Patient had a min HR of 51 bpm, max HR of 143 bpm, and avg HR of 66 bpm. Predominant underlying rhythm was Sinus Rhythm. First Degree AV Block was present. Bundle Branch Block/IVCD was present. QRS morphology changes were present throughout recording. 2 Supraventricular Tachycardia runs occurred, the run with the fastest interval lasting 16 beats with a max rate of 143 bpm (avg 120 bpm); the run with the fastest interval was also the longest. Isolated SVEs were rare (<1.0%), SVE Couplets were rare (<1.0%), and SVE Triplets were rare (<1.0%). Isolated VEs  were rare (<1.0%), and no VE Couplets or VE Triplets were present.      Subjective: Patient seen and examined at bedside today.  Hemodynamically stable for discharge.  Discharge planning discussed with daughter at bedside  Discharge Exam: Vitals:   12/02/22 0636 12/02/22 0906  BP: (!) 150/65   Pulse: (!) 57   Resp: 18   Temp: 98.3 F (36.8 C)   SpO2: 95% 94%   Vitals:   12/01/22 2115 12/02/22 0636 12/02/22 0647 12/02/22 0906  BP: (!) 130/55 (!) 150/65    Pulse: 69 (!) 57    Resp: 18 18    Temp: 100 F (37.8 C) 98.3 F (36.8 C)    TempSrc: Oral Oral    SpO2: 92% 95%  94%  Weight:   51.6 kg   Height:        General: Pt is alert, awake, not in acute distress Cardiovascular: RRR, S1/S2 +, no rubs, no  gallops Respiratory: Mild bilateral expiratory wheezing Abdominal: Soft, NT, ND, bowel sounds + Extremities: no edema, no cyanosis    The results of significant diagnostics from this hospitalization (including imaging, microbiology, ancillary and laboratory) are listed below for reference.     Microbiology: Recent Results (from the past 240 hour(s))  Resp panel by RT-PCR (RSV, Flu A&B, Covid) Anterior Nasal Swab     Status: Abnormal   Collection Time: 11/30/22  6:29 PM   Specimen: Anterior Nasal Swab  Result Value Ref Range Status   SARS Coronavirus 2 by RT PCR NEGATIVE NEGATIVE Final    Comment: (NOTE) SARS-CoV-2 target nucleic acids are NOT DETECTED.  The SARS-CoV-2 RNA is generally detectable in upper respiratory specimens during the acute phase of infection. The lowest concentration of SARS-CoV-2 viral copies this assay can detect is 138 copies/mL. A negative result does not preclude SARS-Cov-2 infection and should not be used as the sole basis for treatment or other patient management decisions. A negative result may occur with  improper specimen collection/handling, submission of specimen other than nasopharyngeal swab, presence of viral mutation(s) within the areas targeted by this assay, and inadequate number of viral copies(<138 copies/mL). A negative result must be combined with clinical observations, patient history, and epidemiological information. The expected result is Negative.  Fact Sheet for Patients:  EntrepreneurPulse.com.au  Fact Sheet for Healthcare Providers:  IncredibleEmployment.be  This test is no t yet approved or cleared by the [place] FDA and  has been authorized for detection and/or diagnosis of SARS-CoV-2 by FDA under an Emergency Use Authorization (EUA). This EUA will remain  in effect (meaning this test can be used) for the duration of the COVID-19 declaration under Section 564(b)(1) of the Act,  21 U.S.C.section 360bbb-3(b)(1), unless the authorization is terminated  or revoked sooner.       Influenza A by PCR NEGATIVE NEGATIVE Final   Influenza B by PCR NEGATIVE NEGATIVE Final    Comment: (NOTE) The Xpert Xpress SARS-CoV-2/FLU/RSV plus assay is intended as an aid in the diagnosis of influenza from Nasopharyngeal swab specimens and should not be used as a sole basis for treatment. Nasal washings and aspirates are unacceptable for Xpert Xpress SARS-CoV-2/FLU/RSV testing.  Fact Sheet for Patients: EntrepreneurPulse.com.au  Fact Sheet for Healthcare Providers: IncredibleEmployment.be  This test is not yet approved or cleared by the [place] FDA and has been authorized for detection and/or diagnosis of SARS-CoV-2 by FDA under an Emergency Use Authorization (EUA). This EUA will remain in effect (meaning this test can be used)  for the duration of the COVID-19 declaration under Section 564(b)(1) of the Act, 21 U.S.C. section 360bbb-3(b)(1), unless the authorization is terminated or revoked.     Resp Syncytial Virus by PCR POSITIVE (A) NEGATIVE Final    Comment: (NOTE) Fact Sheet for Patients: BloggerCourse.com  Fact Sheet for Healthcare Providers: SeriousBroker.it  This test is not yet approved or cleared by the [place] FDA and has been authorized for detection and/or diagnosis of SARS-CoV-2 by FDA under an Emergency Use Authorization (EUA). This EUA will remain in effect (meaning this test can be used) for the duration of the COVID-19 declaration under Section 564(b)(1) of the Act, 21 U.S.C. section 360bbb-3(b)(1), unless the authorization is terminated or revoked.  Performed at [facility], [address], [place], [place] [address]   Urine Culture     Status: None   Collection Time: 11/30/22  7:00 PM   Specimen: Urine, Clean Catch  Result Value  Ref Range Status   Specimen Description   Final    URINE, CLEAN CATCH Performed at [facility], [address], [place], [place] [address]    Special Requests   Final    NONE Performed at [facility], [address], [place], [place] [address]    Culture   Final    NO GROWTH Performed at [facility], [address] [place], [place] [address]    Report Status 12/01/2022 FINAL  Final  Blood culture (routine x 2)     Status: None (Preliminary result)   Collection Time: 11/30/22  7:00 PM   Specimen: BLOOD  Result Value Ref Range Status   Specimen Description   Final    BLOOD BLOOD RIGHT ARM Performed at [facility], [address], [place], [place] [address]    Special Requests   Final    BOTTLES DRAWN AEROBIC AND ANAEROBIC Blood Culture results may not be optimal due to an inadequate volume of blood received in culture bottles Performed at [facility], [address] [place], [place] [address]    Culture   Final    NO GROWTH 2 DAYS Performed at [facility], [address], [place], [place] [address]    Report Status PENDING  Incomplete     Labs: BNP (last 3 results) Recent Labs    08/25/22 1918 09/04/22 1059  BNP 2,352.3* 148.5*   Basic Metabolic Panel: Recent Labs  Lab 11/30/22 1825 12/01/22 0640 12/02/22 0625  NA 127* 130* 131*  K 4.2 3.7 3.8  CL 94* 99 99  CO2 25 24 26   GLUCOSE 144* 101* 98  BUN 19 18 15   CREATININE 0.96 0.78 0.79  CALCIUM 9.0 8.3* 8.8*   Liver Function Tests: Recent Labs  Lab 11/30/22 1825  AST 23  ALT 12  ALKPHOS 67  BILITOT 0.8  PROT 7.4  ALBUMIN 3.9   No results for input(s): "LIPASE", "AMYLASE" in the last 168 hours. No results for input(s): "AMMONIA" in the last 168 hours. CBC: Recent Labs  Lab 11/30/22 1825 12/01/22 0640 12/02/22 0625  WBC 15.5* 13.5* 9.5  NEUTROABS 13.3*  --   --   HGB 13.3 11.5* 11.4*  HCT 40.4 35.1*  35.3*  MCV 96.0 98.6 97.8  PLT 192 150 147*   Cardiac Enzymes: No results for input(s): "CKTOTAL", "CKMB", "CKMBINDEX", "TROPONINI" in the last 168 hours. BNP: Invalid input(s): "POCBNP" CBG: No results for input(s): "GLUCAP" in the last 168 hours.  D-Dimer No results for input(s): "DDIMER" in the last 72 hours. Hgb A1c No results for input(s): "HGBA1C" in the last 72 hours. Lipid Profile No results for input(s): "CHOL", "HDL", "LDLCALC", "TRIG", "CHOLHDL", "LDLDIRECT" in the last 72 hours. Thyroid function studies No results for input(s): "TSH", "T4TOTAL", "T3FREE", "THYROIDAB" in the last 72 hours.  Invalid input(s): "FREET3" Anemia work up No results for input(s): "VITAMINB12", "FOLATE", "FERRITIN", "TIBC", "IRON", "RETICCTPCT" in the last 72 hours. Urinalysis    Component Value Date/Time   COLORURINE YELLOW 11/30/2022 1900   APPEARANCEUR CLEAR 11/30/2022 1900   LABSPEC 1.012 11/30/2022 1900   PHURINE 6.0 11/30/2022 1900   GLUCOSEU NEGATIVE 11/30/2022 1900   HGBUR SMALL (A) 11/30/2022 1900   BILIRUBINUR NEGATIVE 11/30/2022 1900   BILIRUBINUR negative 01/03/2020 1100   KETONESUR NEGATIVE 11/30/2022 1900   PROTEINUR NEGATIVE 11/30/2022 1900   UROBILINOGEN 1.0 01/03/2020 1100   UROBILINOGEN 1 12/22/2011 1442   NITRITE NEGATIVE 11/30/2022 1900   LEUKOCYTESUR NEGATIVE 11/30/2022 1900   Sepsis Labs Recent Labs  Lab 11/30/22 1825 12/01/22 0640 12/02/22 0625  WBC 15.5* 13.5* 9.5   Microbiology Recent Results (from the past 240 hour(s))  Resp panel by RT-PCR (RSV, Flu A&B, Covid) Anterior Nasal Swab     Status: Abnormal   Collection Time: 11/30/22  6:29 PM   Specimen: Anterior Nasal Swab  Result Value Ref Range Status   SARS Coronavirus 2 by RT PCR NEGATIVE NEGATIVE Final    Comment: (NOTE) SARS-CoV-2 target nucleic acids are NOT DETECTED.  The SARS-CoV-2 RNA is generally detectable in upper respiratory specimens during the acute phase of infection. The  lowest concentration of SARS-CoV-2 viral copies this assay can detect is 138 copies/mL. A negative result does not preclude SARS-Cov-2 infection and should not be used as the sole basis for treatment or other patient management decisions. A negative result may occur with  improper specimen collection/handling, submission of specimen other than nasopharyngeal swab, presence of viral mutation(s) within the areas targeted by this assay, and inadequate number of viral copies(<138 copies/mL). A negative result must be combined with clinical observations, patient history, and epidemiological information. The expected result is Negative.  Fact Sheet for Patients:  BloggerCourse.com  Fact Sheet for Healthcare Providers:  SeriousBroker.it  This test is no t yet approved or cleared by the [place] FDA and  has been authorized for detection and/or diagnosis of SARS-CoV-2 by FDA under an Emergency Use Authorization (EUA). This EUA will remain  in effect (meaning this test can be used) for the duration of the COVID-19 declaration under Section 564(b)(1) of the Act, 21 U.S.C.section 360bbb-3(b)(1), unless the authorization is terminated  or revoked sooner.       Influenza A by PCR NEGATIVE NEGATIVE Final   Influenza B by PCR NEGATIVE NEGATIVE Final    Comment: (NOTE) The Xpert Xpress SARS-CoV-2/FLU/RSV plus assay is intended as an aid in the diagnosis of influenza from Nasopharyngeal swab specimens and should not be used as a sole basis for treatment. Nasal washings and aspirates are unacceptable for Xpert Xpress SARS-CoV-2/FLU/RSV testing.  Fact Sheet for Patients: BloggerCourse.com  Fact Sheet for Healthcare Providers: SeriousBroker.it  This test is not yet approved or cleared by the [place] FDA and has been authorized for detection and/or diagnosis of SARS-CoV-2 by FDA under  an Emergency Use Authorization (EUA). This EUA will remain in effect (meaning this test can be used) for the duration of the COVID-19 declaration under Section 564(b)(1) of the Act, 21 U.S.C. section  360bbb-3(b)(1), unless the authorization is terminated or revoked.     Resp Syncytial Virus by PCR POSITIVE (A) NEGATIVE Final    Comment: (NOTE) Fact Sheet for Patients: BloggerCourse.com  Fact Sheet for Healthcare Providers: SeriousBroker.it  This test is not yet approved or cleared by the [place] FDA and has been authorized for detection and/or diagnosis of SARS-CoV-2 by FDA under an Emergency Use Authorization (EUA). This EUA will remain in effect (meaning this test can be used) for the duration of the COVID-19 declaration under Section 564(b)(1) of the Act, 21 U.S.C. section 360bbb-3(b)(1), unless the authorization is terminated or revoked.  Performed at [facility], [address] [place], [place] [address]   Urine Culture     Status: None   Collection Time: 11/30/22  7:00 PM   Specimen: Urine, Clean Catch  Result Value Ref Range Status   Specimen Description   Final    URINE, CLEAN CATCH Performed at [facility], [address] [place], [place] [address]    Special Requests   Final    NONE Performed at [facility], [address] [place], [place] [address]    Culture   Final    NO GROWTH Performed at [facility] [place] [facility], [address], [place], [place] [address]    Report Status 12/01/2022 FINAL  Final  Blood culture (routine x 2)     Status: None (Preliminary result)   Collection Time: 11/30/22  7:00 PM   Specimen: BLOOD  Result Value Ref Range Status   Specimen Description   Final    BLOOD BLOOD RIGHT ARM Performed at [facility], [address] [place], [place] [address]    Special Requests   Final    BOTTLES DRAWN AEROBIC  AND ANAEROBIC Blood Culture results may not be optimal due to an inadequate volume of blood received in culture bottles Performed at [facility], [address], [place], [place] [address]    Culture   Final    NO GROWTH 2 DAYS Performed at [facility], [address] [place], [place] [address]    Report Status PENDING  Incomplete    Please note: You were cared for by a hospitalist during your hospital stay. Once you are discharged, your primary care physician will handle any further medical issues. Please note that NO REFILLS for any discharge medications will be authorized once you are discharged, as it is imperative that you return to your primary care physician (or establish a relationship with a primary care physician if you do not have one) for your post hospital discharge needs so that they can reassess your need for medications and monitor your lab values.    Time coordinating discharge: 40 minutes  SIGNED:   Burnadette Pop, MD  [facility] 12/02/2022, 10:58 AM Pager [phone]  If 7PM-7AM, please contact night-coverage [email or web address] Password [email or web address]

## 2022-12-02 NOTE — Evaluation (Signed)
Physical Therapy Evaluation Patient Details Name: [name] MRN: [id number] DOB: 08-23-1928 Today's Date: 12/02/2022  History of Present Illness  Patient is a 89 year old female who presented after a fall with fever. Patient was found to have RSV, sepsis and pneumonia. Patient was noted to have acute nondisplaced fracture through right C5 lamia with imaging with C6/C7 fractures still present as well. WUJ:WJXBJYN, maxillary, and C6/7 fractures. PAF, chronic combined systolic and diastolic CHF, CAD s/p DES, LBBB, chronic orthostasis, asthma  Clinical Impression  Pt admitted with above diagnosis. Pt from home, lives with great grandaughter and her family, amb with RW around the home and in community, supv and light assist with ADLs. Pt with multiple falls, great granddaughter estimates 4-6, with multiple fxs. Pt powers to stand with min guard, able to ambulate 80 ft with RW and min guard, needing increased time and cues in tight spaces and with turns for safety. Recommend return home with great granddaughter and HHPT. Pt currently with functional limitations due to the deficits listed below (see PT Problem List). Pt will benefit from skilled PT to increase their independence and safety with mobility to allow discharge to the venue listed below.          Recommendations for follow up therapy are one component of a multi-disciplinary discharge planning process, led by the attending physician.  Recommendations may be updated based on patient status, additional functional criteria and insurance authorization.  Follow Up Recommendations Home health PT      Assistance Recommended at Discharge Frequent or constant Supervision/Assistance  Patient can return home with the following  A little help with walking and/or transfers;A little help with bathing/dressing/bathroom;Assistance with cooking/housework;Assist for transportation;Help with stairs or ramp for entrance    Equipment Recommendations None  recommended by PT  Recommendations for Other Services       Functional Status Assessment Patient has had a recent decline in their functional status and demonstrates the ability to make significant improvements in function in a reasonable and predictable amount of time.     Precautions / Restrictions Precautions Precautions: Fall Precaution Comments: Cervical, monitor HR Restrictions Weight Bearing Restrictions: No      Mobility  Bed Mobility Overal bed mobility: Needs Assistance Bed Mobility: Supine to Sit  Supine to sit: Supervision  General bed mobility comments: increased time, supv for safety    Transfers Overall transfer level: Needs assistance Equipment used: Rolling walker (2 wheels) Transfers: Sit to/from Stand Sit to Stand: Min guard  General transfer comment: min guard for safety, cues for hand placement    Ambulation/Gait Ambulation/Gait assistance: Min guard Gait Distance (Feet): 80 Feet Assistive device: Rolling walker (2 wheels) Gait Pattern/deviations: Step-through pattern, Decreased stride length, Trunk flexed Gait velocity: decreased  General Gait Details: pt with flexed trunk, slow cadence, adequate bil foot clearance with steps, increased time with turns and cues in tight spaces for safety, min guard for safety  Stairs            Wheelchair Mobility    Modified Rankin (Stroke Patients Only)       Balance Overall balance assessment: Mild deficits observed, not formally tested       Pertinent Vitals/Pain Pain Assessment Pain Assessment: No/denies pain    Home Living Family/patient expects to be discharged to:: Private residence Living Arrangements: Other relatives (granddaughter) Available Help at Discharge: Family;Available 24 hours/day Type of Home: House Home Access: Ramped entrance  Alternate Level Stairs-Number of Steps: flight Home Layout: Laundry or work  area in basement;Able to live on main level with bedroom/bathroom;Two  level Home Equipment: Conservation officer, nature (2 wheels);Cane - quad;Shower seat;BSC/3in1;Grab bars - tub/shower      Prior Function Prior Level of Function : Independent/Modified Independent  Mobility Comments: family reports pt using RW inthe home and in the community, estimates 4-6 falls in the last 6 months ADLs Comments: family reports pt needing assist with ADLs, attempts to do them ind but not safe     Hand Dominance   Dominant Hand: Right    Extremity/Trunk Assessment   Upper Extremity Assessment Upper Extremity Assessment: Defer to OT evaluation    Lower Extremity Assessment Lower Extremity Assessment: Generalized weakness    Cervical / Trunk Assessment Cervical / Trunk Assessment: Kyphotic  Communication   Communication: No difficulties  Cognition Arousal/Alertness: Awake/alert Behavior During Therapy: WFL for tasks assessed/performed Overall Cognitive Status: Within Functional Limits for tasks assessed       General Comments      Exercises     Assessment/Plan    PT Assessment Patient needs continued PT services  PT Problem List Decreased strength;Decreased range of motion;Decreased activity tolerance;Decreased balance;Decreased knowledge of use of DME;Decreased safety awareness       PT Treatment Interventions DME instruction;Gait training;Functional mobility training;Therapeutic activities;Therapeutic exercise;Balance training;Patient/family education    PT Goals (Current goals can be found in the Care Plan section)  Acute Rehab PT Goals Patient Stated Goal: home with family support PT Goal Formulation: With patient/family Time For Goal Achievement: 12/16/22 Potential to Achieve Goals: Good    Frequency Min 3X/week     Co-evaluation               AM-PAC PT "6 Clicks" Mobility  Outcome Measure Help needed turning from your back to your side while in a flat bed without using bedrails?: None Help needed moving from lying on your back to sitting on  the side of a flat bed without using bedrails?: A Little Help needed moving to and from a bed to a chair (including a wheelchair)?: A Little Help needed standing up from a chair using your arms (e.g., wheelchair or bedside chair)?: A Little Help needed to walk in hospital room?: A Little Help needed climbing 3-5 steps with a railing? : A Lot 6 Click Score: 18    End of Session Equipment Utilized During Treatment: Gait belt Activity Tolerance: Patient tolerated treatment well Patient left: in chair;with call bell/phone within reach;with family/visitor present Nurse Communication: Mobility status PT Visit Diagnosis: Muscle weakness (generalized) (M62.81);History of falling (Z91.81)    Time: 1026-1100 PT Time Calculation (min) (ACUTE ONLY): 34 min   Charges:   PT Evaluation $PT Eval Low Complexity: 1 Low PT Treatments $Gait Training: 8-22 mins         Tori Laterica Matarazzo PT, DPT 12/02/22, 11:13 AM

## 2022-12-03 ENCOUNTER — Telehealth: Payer: Self-pay

## 2022-12-03 NOTE — Telephone Encounter (Signed)
Transition Care Management Follow-up Telephone Call Date of discharge and from where: [name] 12/02/2022 How have you been since you were released from the hospital? Better, still coughing Any questions or concerns? No  Items Reviewed: Did the pt receive and understand the discharge instructions provided? Yes  Medications obtained and verified? Yes  Other? No  Any new allergies since your discharge? No  Dietary orders reviewed? Yes Do you have support at home? Yes   Home Care and Equipment/Supplies: Were home health services ordered? yes If so, what is the name of the agency? unknown  Has the agency set up a time to come to the patient's home? no Were any new equipment or medical supplies ordered?  No What is the name of the medical supply agency? N/a Were you able to get the supplies/equipment? no Do you have any questions related to the use of the equipment or supplies? No  Functional Questionnaire: (I = Independent and D = Dependent) ADLs: I  Bathing/Dressing- D  Meal Prep- D  Eating- I  Maintaining continence- I  Transferring/Ambulation- I  Managing Meds- D  Follow up appointments reviewed:  PCP Hospital f/u appt confirmed? Yes  Scheduled to see Wilfred Lacy on 12/11/2022 @ 9:00. [facility] f/u appt confirmed? No  Are transportation arrangements needed? No  If their condition worsens, is the pt aware to call PCP or go to the Emergency Dept.? Yes Was the patient provided with contact information for the PCP's office or ED? Yes Was to pt encouraged to call back with questions or concerns? Yes  Juanda Crumble, LPN [facility] [phone]

## 2022-12-05 LAB — CULTURE, BLOOD (ROUTINE X 2): Culture: NO GROWTH

## 2022-12-09 ENCOUNTER — Telehealth: Payer: Self-pay | Admitting: Cardiovascular Disease

## 2022-12-09 NOTE — Telephone Encounter (Signed)
Patient's granddaughter is calling to get clarification on a medication. Please call back

## 2022-12-10 ENCOUNTER — Telehealth: Payer: Medicare Other

## 2022-12-10 ENCOUNTER — Ambulatory Visit: Payer: Medicare Other | Admitting: Cardiovascular Disease

## 2022-12-10 NOTE — Telephone Encounter (Signed)
Returned call to granddaughter Bethann Berkshire who states that patient has had another fall. States she was sitting in her chair and stood up on her own and fell. Patient fractured her C5-C7. Patient had 3 falls last year. Granddaughter is concerned about patient taking both Eliquis and Clopidogrel because MD at ED told her she should speak with cardiologist about continued use. Routing to [employer] for review. Advised granddaughter to hold until she hears back from [place].

## 2022-12-11 ENCOUNTER — Encounter: Payer: Self-pay | Admitting: Nurse Practitioner

## 2022-12-11 ENCOUNTER — Emergency Department (HOSPITAL_COMMUNITY): Payer: Medicare Other

## 2022-12-11 ENCOUNTER — Encounter (HOSPITAL_COMMUNITY): Payer: Self-pay | Admitting: Emergency Medicine

## 2022-12-11 ENCOUNTER — Emergency Department (HOSPITAL_COMMUNITY)
Admission: EM | Admit: 2022-12-11 | Discharge: 2022-12-11 | Disposition: A | Discharge status: Home / Self Care | Payer: Medicare Other | Attending: Emergency Medicine | Admitting: Emergency Medicine

## 2022-12-11 ENCOUNTER — Ambulatory Visit (INDEPENDENT_AMBULATORY_CARE_PROVIDER_SITE_OTHER): Payer: Medicare Other | Admitting: Nurse Practitioner

## 2022-12-11 VITALS — BP 118/70 | HR 74 | Temp 97.3°F | Ht <= 58 in | Wt 112.0 lb

## 2022-12-11 DIAGNOSIS — I2510 Atherosclerotic heart disease of native coronary artery without angina pectoris: Secondary | ICD-10-CM | POA: Insufficient documentation

## 2022-12-11 DIAGNOSIS — I509 Heart failure, unspecified: Secondary | ICD-10-CM | POA: Diagnosis not present

## 2022-12-11 DIAGNOSIS — W04XXXA Fall while being carried or supported by other persons, initial encounter: Secondary | ICD-10-CM | POA: Insufficient documentation

## 2022-12-11 DIAGNOSIS — S59912A Unspecified injury of left forearm, initial encounter: Secondary | ICD-10-CM | POA: Diagnosis present

## 2022-12-11 DIAGNOSIS — W19XXXA Unspecified fall, initial encounter: Secondary | ICD-10-CM

## 2022-12-11 DIAGNOSIS — S52102A Unspecified fracture of upper end of left radius, initial encounter for closed fracture: Secondary | ICD-10-CM | POA: Diagnosis not present

## 2022-12-11 DIAGNOSIS — S0990XA Unspecified injury of head, initial encounter: Secondary | ICD-10-CM | POA: Insufficient documentation

## 2022-12-11 DIAGNOSIS — J189 Pneumonia, unspecified organism: Secondary | ICD-10-CM | POA: Diagnosis not present

## 2022-12-11 DIAGNOSIS — Z7902 Long term (current) use of antithrombotics/antiplatelets: Secondary | ICD-10-CM | POA: Insufficient documentation

## 2022-12-11 DIAGNOSIS — Z7901 Long term (current) use of anticoagulants: Secondary | ICD-10-CM | POA: Diagnosis not present

## 2022-12-11 DIAGNOSIS — I25119 Atherosclerotic heart disease of native coronary artery with unspecified angina pectoris: Secondary | ICD-10-CM

## 2022-12-11 LAB — BASIC METABOLIC PANEL
BUN: 26 mg/dL — ABNORMAL HIGH (ref 6–23)
CO2: 30 mEq/L (ref 19–32)
Calcium: 9.3 mg/dL (ref 8.4–10.5)
Chloride: 98 mEq/L (ref 96–112)
Creatinine, Ser: 0.91 mg/dL (ref 0.40–1.20)
GFR: 54.59 mL/min — ABNORMAL LOW (ref >60.00)
Glucose, Bld: 105 mg/dL — ABNORMAL HIGH (ref 70–99)
Potassium: 4.1 mEq/L (ref 3.5–5.1)
Sodium: 135 mEq/L (ref 135–145)

## 2022-12-11 LAB — CBC WITH DIFFERENTIAL/PLATELET
Basophils Absolute: 0 10*3/uL (ref 0.0–0.1)
Basophils Relative: 0.3 % (ref 0.0–3.0)
Eosinophils Absolute: 0.1 10*3/uL (ref 0.0–0.7)
Eosinophils Relative: 1.2 % (ref 0.0–5.0)
HCT: 39.6 % (ref 36.0–46.0)
Hemoglobin: 13.2 g/dL (ref 12.0–15.0)
Lymphocytes Relative: 17.5 % (ref 12.0–46.0)
Lymphs Abs: 1.6 10*3/uL (ref 0.7–4.0)
MCHC: 33.3 g/dL (ref 30.0–36.0)
MCV: 95.1 fl (ref 78.0–100.0)
Monocytes Absolute: 0.6 10*3/uL (ref 0.1–1.0)
Monocytes Relative: 6.9 % (ref 3.0–12.0)
Neutro Abs: 6.7 10*3/uL (ref 1.4–7.7)
Neutrophils Relative %: 74.1 % (ref 43.0–77.0)
Platelets: 263 10*3/uL (ref 150.0–400.0)
RBC: 4.16 Mil/uL (ref 3.87–5.11)
RDW: 16.9 % — ABNORMAL HIGH (ref 11.5–15.5)
WBC: 9 10*3/uL (ref 4.0–10.5)

## 2022-12-11 MED ORDER — TRAMADOL HCL 50 MG PO TABS
50.0000 mg | ORAL_TABLET | Freq: Once | ORAL | Status: AC
  Administered 2022-12-11: 50 mg via ORAL
  Filled 2022-12-11: qty 1

## 2022-12-11 MED ORDER — TRAMADOL HCL 50 MG PO TABS: 25.0000 mg | ORAL_TABLET | Freq: Four times a day (QID) | ORAL | 0 refills | Status: DC | PRN

## 2022-12-11 MED ORDER — ACETAMINOPHEN 500 MG PO TABS
1000.0000 mg | ORAL_TABLET | Freq: Once | ORAL | Status: AC
  Administered 2022-12-11: 1000 mg via ORAL
  Filled 2022-12-11: qty 2

## 2022-12-11 NOTE — Telephone Encounter (Signed)
Returned call to granddaughter Bethann Berkshire to inform her of Dr Elmarie Shiley recommendation to stop the Eliquis due to safety concern over multiple falls. [name] verbalized understanding and agrees to plan. Medication removed from

## 2022-12-11 NOTE — Discharge Instructions (Addendum)
You were seen after your fall today.  Unfortunately, you did break your left elbow at the radius.  You are placed in a splint and sling for this broken bone.  You will need to call Dr. Lucia Gaskins the orthopedic doctor listed in your discharge paperwork to make a follow-up appointment.  You can take Tylenol and ibuprofen for pain but if pain is still severe you can take tramadol as needed.  Do not take while drinking alcohol or driving a car.  This medicine can be sedating.  Continue to wear your neck collar and follow-up with neurosurgery regarding your known neck fractures.  Come back if any severe worsening headache, confusion, uncontrollable nausea and vomiting, or any other symptoms concerning to you.  SPLINT INSTRUCTIONS   WEIGHT BEARING Your weight bearing status is: no weight bearing on your left arm   This will need to be maintained until you are seen by in the orthopedic clinic.     SPLINT/CAST CARE DO NOT REMOVE: Please do NOT remove your splint/cast.  You should keep your dressing clean, dry, and intact. Showering should be done with a plastic bag over your cast/splint and securely taped above your cast/splint. Ice can help with pain. However, avoid getting your splint/cast wet.  Swelling and bruising can be normal after an injury.  Keep your extremity elevated as much as possible this will help decrease your swelling and improve your pain.  If any of the below should occur, please call the orthopedist or go to a local ED. Soaking through of dressing/splint Pain unable to be controlled by prescribed pain medication  MEDICATIONS Multimodal Pain Medication  The most important aspect of pain management is elevation of your injured extremity above the level of your heart with pillows, blankets, etc. Swelling can account for a large amount of your pain.  We recommend multimodal pain control, these medications are used to provide you with as optimal pain control as possible. Most pain  can be well managed with Tylenol.  Scheduled tylenol 1000 mg of tylenol every 8 hours (unless contraindicated due to liver problems) Ibuprofen: 600 mg every 8 hours as needed for pain (unless contraindicated due to kidney disease, GI bleed, etc) Do not take on an empty stomach. If you have blood in your stool or significant abdominal pain, stop taking medication immediately and call your PCP or go to the ED.  Tramadol 25 mg mg every 6 hours as needed for pain if other medications are not providing relief.  Given that [facility] only allows providers to prescribe a limited amount of narcotics, we recommend that narcotics are used sparingly if you have been prescribed any

## 2022-12-11 NOTE — ED Provider Triage Note (Signed)
Emergency Medicine Provider Triage Evaluation Note  [name] , a 89 y.o. female  was evaluated in triage.  Pt complains of fall, neck, head injury, left elbow pain, left hip pain since this morning.  Patient is already recovering from another fall with several known cervical spine fractures, thoracic spine fracture, she arrives in cervical collar.  Review of Systems  Positive: Neck pain, elbow pain, hip pain Negative: LOC  Physical Exam  BP 132/73 (BP Location: Right Arm)   Pulse 67   Temp 98.4 F (36.9 C) (Oral)   Resp 15   SpO2 99%  Gen:   Awake, no distress   Resp:  Normal effort  MSK:   Moves extremities without difficulty  Other:  Patient moves all 4 limbs spontaneously, CN II through XII grossly intact, intact radial, ulnar pulses, as well as DP, PT pulses, swelling noted of left elbow with significant tenderness palpation, as well as some tenderness palpation left hip, and cervical and thoracic paraspinous muscles.  Medical Decision Making  Medically screening exam initiated at 4:26 PM.  Appropriate orders placed.  [name] was informed that the remainder of the evaluation will be completed by another provider, this initial triage assessment does not replace that evaluation, and the importance of remaining in the ED until their evaluation is complete.  Workup initiated in triage    Anselmo Pickler, [place] 12/11/22 1628

## 2022-12-11 NOTE — Patient Instructions (Addendum)
Go to lab Continue nebulizer treatment 3x/day. Start breathing exercise 2-3x/day.(10 repetitions each time) Go to [address] for CXR in 2weeks. Schedule appt with neurosurgeon  How to use an Incentive Spirometer This video is a step by step demonstration and description of how to use an Chiropodist. The video allows time for the patient to return demonstrate how to use the spirometer.  To view the content, go to this web address: [email or web address]  This video will expire on: 07/28/2024. If you need access to this video following this date, please reach out to the healthcare provider who assigned it to you. This information is not intended to replace advice given to you by your health care provider. Make sure you discuss any questions you have with your health care provider. Else[employer] Patient Education  [place].

## 2022-12-11 NOTE — ED Provider Notes (Addendum)
[place] [facility]ERGENCY DEPARTMENT AT [facility] Provider Note   CSN: [id number] Arrival date & time: 12/11/22  1337     History  Chief Complaint  Patient presents with   [name] is a 89 y.o. female.  With PMH of A-fib on Eliquis, CHF, CAD who presents with left elbow and arm pain after mechanical fall earlier today.  Of note, she had a fall in early December 2023 where she was found to have C-spine fractures as well as maxillary fractures which she still remains in a collar for.  Patient was getting out of her recliner to walk across the room to get to her walker when she lost her balance and fell to her left side putting her left arm out.  She did not hit her head or lose consciousness.  She has been standing and ambulating since the fall.  She has been complaining of pain in her left elbow but denies any pain elsewhere.  She has no loss sensation or weakness.  She denies any symptoms preceding the fall, no chest pain, no shortness of breath, no lightheadedness or dizziness.  She took her last dose of Eliquis this morning and spoke with her cardiologist who recommended she stop her Eliquis.  She remains on Plavix.   Fall       Home Medications Prior to Admission medications   Medication Sig Start Date End Date Taking? Authorizing Provider  acetaminophen (TYLENOL) 325 MG tablet Take 2 tablets (650 mg total) by mouth every 6 (six) hours as needed for mild pain (or Fever >/= 101). 09/02/22   Rhetta Mura, MD  Acetaminophen-DM (CORICIDIN HBP COLD/COUGH/FLU PO) Take 1 tablet by mouth every 6 (six) hours as needed (cold symptoms).    [provider]  albuterol (VENTOLIN HFA) 108 (90 Base) MCG/ACT inhaler Inhale 1-2 puffs into the lungs every 6 (six) hours as needed for wheezing or shortness of breath. 01/24/21   Nche, Bonna Gains, NP  amiodarone (PACERONE) 200 MG tablet Take 1/2 tablet by mouth daily Patient taking differently: Take 100 mg by  mouth daily. Take 1/2 tablet by mouth daily 09/08/22   Nahser, Deloris Ping, MD  carvedilol (COREG) 3.125 MG tablet Take 1 tablet (3.125 mg total) by mouth 2 (two) times daily with a meal. 09/02/22   Rhetta Mura, MD  clopidogrel (PLAVIX) 75 MG tablet Take 75 mg by mouth daily.    [provider]  ezetimibe (ZETIA) 10 MG tablet TAKE 1 TABLET BY MOUTH  DAILY 06/30/22   Nahser, Deloris Ping, MD  furosemide (LASIX) 20 MG tablet Take 1 tablet by mouth three times per week Patient taking differently: Take 20 mg by mouth See admin instructions. Take 1 tablet by mouth three times per week on Mon Wed and Fridays 09/08/22   Nahser, Deloris Ping, MD  guaiFENesin (ROBITUSSIN) 100 MG/5ML liquid Take 10 mLs by mouth every 6 (six) hours as needed for cough or to loosen phlegm. Patient not taking: Reported on 12/11/2022 12/02/22   Burnadette Pop, MD  ipratropium-albuterol (DUONEB) 0.5-2.5 (3) MG/3ML SOLN Take 3 mLs by nebulization 3 (three) times daily. 12/02/22   Burnadette Pop, MD  Multiple Vitamins-Minerals (PRESERVISION AREDS 2+MULTI VIT PO) Take 1 capsule by mouth in the morning and at bedtime.    [provider]  spironolactone (ALDACTONE) 25 MG tablet Take 1 tablet (25 mg total) by mouth daily. 11/06/22   Nahser, Deloris Ping, MD  traMADol (ULTRAM) 50 MG tablet Take  0.5 tablets (25 mg total) by mouth every 6 (six) hours as needed for up to 16 doses for severe pain. 12/11/22  Yes Mardene Sayer, MD      Allergies    Codeine, Crestor [rosuvastatin calcium], Entresto [sacubitril-valsartan], Lisinopril, Pantoprazole sodium, Pravastatin, and Simvastatin    Review of Systems   Review of Systems  Physical Exam Updated Vital Signs BP (!) 141/60 (BP Location: Right Arm)   Pulse 66   Temp 99.3 F (37.4 C) (Oral)   Resp 16   SpO2 95%  Physical Exam Constitutional: Alert and oriented.  Appears stated age no acute distress Eyes: Conjunctivae are normal. ENT      Head: Normocephalic and  atraumatic.      Nose: No congestion.      Neck/spine: Remains in soft C-spine collar, no midline tenderness of the C/T/L-spine Cardiovascular: S1, S2, regular rate, equal palpable radial pulses bilaterally Respiratory: Normal respiratory effort. Breath sounds are normal.  No chest wall tenderness or deformity, no crepitus, O2 sat 95 on RA Gastrointestinal: Soft and nontender.  Musculoskeletal: No pelvis tenderness, no pain with logroll bilateral lower extremities.  No pain or deformity of right upper extremity.  There is localized pain, ecchymoses and edema around the left elbow with tenderness to palpation there is no pain or deformity proximal or distal to left elbow, sensation intact distally, full grip strength intact, palpable radial pulses.  Left upper extremity neurovascularly intact.      Right lower leg: No tenderness or edema.      Left lower leg: No tenderness or edema. Neurologic: Normal speech and language. PERRL. EOMI. tongue midline.  No facial droop.  Equal grip strength bilaterally.  5 out of 5 strength bilateral lower extremities.  Sensation grossly intact.  Normal heel-to-shin.  No gross focal neurologic deficits are appreciated. Skin: Skin is warm, dry and intact. No rash noted. Psychiatric: Mood and affect are normal. Speech and behavior are normal.  ED Results / Procedures / Treatments   Labs (all labs ordered are listed, but only abnormal results are displayed) Labs Reviewed - No data to display  EKG None  Radiology CT Head Wo Contrast  Result Date: 12/11/2022 CLINICAL DATA:  Provided history: Head trauma, minor. Neck trauma. Fall EXAM: CT HEAD WITHOUT CONTRAST CT CERVICAL SPINE WITHOUT CONTRAST TECHNIQUE: Multidetector CT imaging of the head and cervical spine was performed following the standard protocol without intravenous contrast. Multiplanar CT image reconstructions of the cervical spine were also generated. RADIATION DOSE REDUCTION: This exam was performed  according to the departmental dose-optimization program which includes automated exposure control, adjustment of the mA and/or kV according to patient size and/or use of iterative reconstruction technique. COMPARISON:  Head CT 11/30/2022. Cervical spine CT 11/30/2022. Maxillofacial CT 10/25/2022. Thoracic spine CT 10/26/2022. FINDINGS: CT HEAD FINDINGS Brain: Generalized cerebral atrophy. Moderate patchy and confluent hypoattenuation within the cerebral white matter, nonspecific but compatible with chronic small vascular disease. There is no acute intracranial hemorrhage. No demarcated cortical infarct. No extra-axial fluid collection. No evidence of an intracranial mass. No midline shift. Vascular: No hyperdense vessel. Atherosclerotic calcifications. Skull: No fracture or aggressive osseous lesion. Sinuses/Orbits: Known depressed fracture of the left orbital floor, which extends into the left inferior orbital rim. The patient also has known fractures of the anterior and posterolateral walls of the left maxillary sinus, which were present on the prior maxillofacial CT of 10/25/2022 but are only partly included in the field of view on today's study. Large fluid level,  and background mucosal thickening, within the right maxillary sinus at the imaged levels. Mild mucosal thickening within the left maxillary sinus at the imaged levels. Mild mucosal thickening, and small-volume frothy secretions, within the bilateral sphenoid sinuses. Mucosal thickening and fluid scattered within bilateral ethmoid air cells, overall moderate in severity. CT CERVICAL SPINE FINDINGS Alignment: 2 mm grade 1 anterolisthesis at C6-C7, C7-T1 and T2-T3. Skull base and vertebrae: The basion-dental and atlanto-dental intervals are maintained. Redemonstrated acute, nondisplaced fracture through the right C5 lamina. Redemonstrated acute, mildly displaced fracture of the right C6 lamina, extending into the base of the C6 spinous process and into  the left C6 lamina. Redemonstrated acute, mildly displaced fracture of the right C6 pedicle. Redemonstrated nondisplaced acute fracture traversing the right C6 transverse foramen. Redemonstrated acute, displaced fracture of the right C7 superior articular process. All of these fractures were present on the prior cervical spine CT of 11/30/2022. No new acute cervical spine fracture is identified. Additionally, there is an acute T3 superior endplate vertebral compression fracture (with mild height loss). This was present on the prior cervical spine CT of 11/30/2022, but is new from the prior thoracic spine CT of 10/25/2022. Facet joint ankylosis on the right at C3-C4. Soft tissues and spinal canal: No prevertebral fluid or swelling. No visible canal hematoma. Disc levels: Cervical spondylosis with multilevel disc space narrowing, disc bulges/central disc protrusions, posterior disc osteophyte complexes, endplate spurring, uncovertebral hypertrophy and facet arthrosis. Disc space narrowing is advanced at C2-C3, C3-C4, C4-C5, C5-C6 and C6-C7. Multilevel spinal canal stenosis. Most notably, posterior disc osteophyte complexes contribute to apparent severe spinal canal stenosis at C4-C5. Multilevel bony neural foraminal narrowing. Upper chest: No consolidation within the imaged lung apices. No visible pneumothorax. Chronic prominence of the interstitial lung markings within the imaged lung apices with biapical pleuroparenchymal scarring. IMPRESSION: CT head: 1. No evidence of acute intracranial abnormality. 2. Parenchymal atrophy and chronic small vessel disease, as described. 3. Known fractures of the left orbital floor, left inferior orbital rim, and anterior and posterolateral walls of the left maxillary sinus. These fractures were present on the prior maxillofacial CT of 10/25/2022 and are incompletely imaged on today's exam. 4. Paranasal sinus disease at the imaged levels, as described. CT cervical spine: 1.  Redemonstrated acute fractures within the C5, C6, and C7 posterior elements, and traversing the right C6 transverse foramen as described. All these fractures were present on the prior cervical spine CT 11/30/2022. Given the fracture traversing the right C6 transverse foramen, consider a CTA of the neck to exclude acute traumatic injury to the cervical right vertebral artery. No interval acute cervical spine fracture is identified. 2. Acute T3 superior endplate vertebral compression fracture, present on the prior cervical spine CT of 11/30/2022 but new from the prior thoracic spine CT of 10/26/2022. 3. 2 mm grade 1 anterolisthesis at C6-C7, C7-T1 and T2-T3, unchanged. 4. Cervical spondylosis, as outlined. Most notably, a posterior disc osteophyte complex contributes to apparent severe spinal canal stenosis at C4-C5. Electronically Signed   By: Jackey Loge D.O.   On: 12/11/2022 16:09   CT Cervical Spine Wo Contrast  Result Date: 12/11/2022 CLINICAL DATA:  Provided history: Head trauma, minor. Neck trauma. Fall EXAM: CT HEAD WITHOUT CONTRAST CT CERVICAL SPINE WITHOUT CONTRAST TECHNIQUE: Multidetector CT imaging of the head and cervical spine was performed following the standard protocol without intravenous contrast. Multiplanar CT image reconstructions of the cervical spine were also generated. RADIATION DOSE REDUCTION: This exam was performed according to the departmental  dose-optimization program which includes automated exposure control, adjustment of the mA and/or kV according to patient size and/or use of iterative reconstruction technique. COMPARISON:  Head CT 11/30/2022. Cervical spine CT 11/30/2022. Maxillofacial CT 10/25/2022. Thoracic spine CT 10/26/2022. FINDINGS: CT HEAD FINDINGS Brain: Generalized cerebral atrophy. Moderate patchy and confluent hypoattenuation within the cerebral white matter, nonspecific but compatible with chronic small vascular disease. There is no acute intracranial hemorrhage. No  demarcated cortical infarct. No extra-axial fluid collection. No evidence of an intracranial mass. No midline shift. Vascular: No hyperdense vessel. Atherosclerotic calcifications. Skull: No fracture or aggressive osseous lesion. Sinuses/Orbits: Known depressed fracture of the left orbital floor, which extends into the left inferior orbital rim. The patient also has known fractures of the anterior and posterolateral walls of the left maxillary sinus, which were present on the prior maxillofacial CT of 10/25/2022 but are only partly included in the field of view on today's study. Large fluid level, and background mucosal thickening, within the right maxillary sinus at the imaged levels. Mild mucosal thickening within the left maxillary sinus at the imaged levels. Mild mucosal thickening, and small-volume frothy secretions, within the bilateral sphenoid sinuses. Mucosal thickening and fluid scattered within bilateral ethmoid air cells, overall moderate in severity. CT CERVICAL SPINE FINDINGS Alignment: 2 mm grade 1 anterolisthesis at C6-C7, C7-T1 and T2-T3. Skull base and vertebrae: The basion-dental and atlanto-dental intervals are maintained. Redemonstrated acute, nondisplaced fracture through the right C5 lamina. Redemonstrated acute, mildly displaced fracture of the right C6 lamina, extending into the base of the C6 spinous process and into the left C6 lamina. Redemonstrated acute, mildly displaced fracture of the right C6 pedicle. Redemonstrated nondisplaced acute fracture traversing the right C6 transverse foramen. Redemonstrated acute, displaced fracture of the right C7 superior articular process. All of these fractures were present on the prior cervical spine CT of 11/30/2022. No new acute cervical spine fracture is identified. Additionally, there is an acute T3 superior endplate vertebral compression fracture (with mild height loss). This was present on the prior cervical spine CT of 11/30/2022, but is new  from the prior thoracic spine CT of 10/25/2022. Facet joint ankylosis on the right at C3-C4. Soft tissues and spinal canal: No prevertebral fluid or swelling. No visible canal hematoma. Disc levels: Cervical spondylosis with multilevel disc space narrowing, disc bulges/central disc protrusions, posterior disc osteophyte complexes, endplate spurring, uncovertebral hypertrophy and facet arthrosis. Disc space narrowing is advanced at C2-C3, C3-C4, C4-C5, C5-C6 and C6-C7. Multilevel spinal canal stenosis. Most notably, posterior disc osteophyte complexes contribute to apparent severe spinal canal stenosis at C4-C5. Multilevel bony neural foraminal narrowing. Upper chest: No consolidation within the imaged lung apices. No visible pneumothorax. Chronic prominence of the interstitial lung markings within the imaged lung apices with biapical pleuroparenchymal scarring. IMPRESSION: CT head: 1. No evidence of acute intracranial abnormality. 2. Parenchymal atrophy and chronic small vessel disease, as described. 3. Known fractures of the left orbital floor, left inferior orbital rim, and anterior and posterolateral walls of the left maxillary sinus. These fractures were present on the prior maxillofacial CT of 10/25/2022 and are incompletely imaged on today's exam. 4. Paranasal sinus disease at the imaged levels, as described. CT cervical spine: 1. Redemonstrated acute fractures within the C5, C6, and C7 posterior elements, and traversing the right C6 transverse foramen as described. All these fractures were present on the prior cervical spine CT 11/30/2022. Given the fracture traversing the right C6 transverse foramen, consider a CTA of the neck to exclude acute traumatic injury  to the cervical right vertebral artery. No interval acute cervical spine fracture is identified. 2. Acute T3 superior endplate vertebral compression fracture, present on the prior cervical spine CT of 11/30/2022 but new from the prior thoracic spine CT  of 10/26/2022. 3. 2 mm grade 1 anterolisthesis at C6-C7, C7-T1 and T2-T3, unchanged. 4. Cervical spondylosis, as outlined. Most notably, a posterior disc osteophyte complex contributes to apparent severe spinal canal stenosis at C4-C5. Electronically Signed   By: Kellie Simmering D.O.   On: 12/11/2022 16:09   DG Elbow Complete Left  Result Date: 12/11/2022 CLINICAL DATA:  Left elbow pain after fall landing on left elbow. EXAM: LEFT ELBOW - COMPLETE 3+ VIEW COMPARISON:  None Available. FINDINGS: There is diffuse decreased bone mineralization. There is elevation of the distal anterior humeral fat pad indicating an elbow joint effusion. There is an acute fracture of the radial head-neck junction with mild posterior displacement of the distal fracture component with respect to the proximal fracture component. There is also mild angulation of the radial head with the volar aspect more caudad/inferior than the dorsal aspect. a nondisplaced fracture lines also extend into the volar aspect of the radial head. Mild irregularity at the distal coronoid process may represent chronic calcifications, however it is difficult to exclude a tiny fracture of distal spurs in this region. Moderate medial elbow joint space narrowing and peripheral osteophytosis. IMPRESSION: Acute fracture of the radial head-neck junction with mild posterior displacement of the radial neck with respect to the radial head. Additional fracture lines extending through the volar aspect of the radial head with anterior downsloping of the volar aspect of the radial head on lateral view. Irregularity of the tip of the coronoid process may represent chronic calcification, although it is difficult to exclude a tiny fracture of the osteophytes at the tip of the coronoid process. Electronically Signed   By: Yvonne Kendall M.D.   On: 12/11/2022 16:06   DG Shoulder Left  Result Date: 12/11/2022 CLINICAL DATA:  Fall.  Pain.  Landed on left elbow. EXAM: LEFT SHOULDER  - 2+ VIEW COMPARISON:  None available FINDINGS: Moderate glenohumeral joint space narrowing and peripheral osteophytosis. Mild acromioclavicular joint space narrowing with moderate peripheral degenerative osteophytes. The subacromial space appears narrowed. Old healed posterior 6th rib fracture. Minimal cortical step-off and sclerosis of a likely remote posterior left eighth rib fracture. Recommend clinical correlation for point tenderness. Moderate calcifications within the aortic arch. IMPRESSION: 1. No left shoulder fracture is identified. 2. Minimal cortical step-off and sclerosis of a likely remote posterior left eighth rib fracture. Recommend clinical correlation for point tenderness. 3. 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 Electronically Signed   By: Yvonne Kendall M.D.   On: 12/11/2022 16:02   DG Hip Unilat W or Wo Pelvis 2-3 Views Left  Result Date: 12/11/2022 CLINICAL DATA:  Fall fall. Landed on left elbow and left hip. Pain. EXAM: DG HIP (WITH OR WITHOUT PELVIS) 2-3V LEFT COMPARISON:  AP pelvis 10/25/2022 FINDINGS: There is again diffuse decreased bone mineralization. No acute fracture is seen. No dislocation. Mild bilateral femoroacetabular joint space narrowing. The bilateral sacroiliac and pubic symphysis joint spaces are maintained. Mild-to-moderate vascular calcifications. IMPRESSION: 1. Within the limitations of diffuse decreased bone mineralization, no acute fracture is seen. 2. Mild bilateral femoroacetabular osteoarthritis. Electronically Signed   By: Yvonne Kendall M.D.   On: 12/11/2022 15:59    Procedures .Ortho Injury Treatment  Date/Time: 12/11/2022 8:25 PM  Performed by: Elgie Congo, MD Authorized by:  Mardene Sayer, MD   Consent:    Consent obtained:  Verbal   Consent given by:  Patient   Risks discussed:  Stiffness and restricted joint movement   Alternatives discussed:  No treatmentInjury location: upper arm Location details: left  upper arm Injury type: fracture-dislocation Pre-procedure distal perfusion: normal Pre-procedure neurological function: normal Pre-procedure range of motion: reduced  Anesthesia: Local anesthesia used: no  Patient sedated: NoImmobilization: splint Splint type: long arm Splint Applied by: Ortho Tech Supplies used: Ortho-Glass Post-procedure neurovascular assessment: post-procedure neurovascularly intact Post-procedure distal perfusion: normal Post-procedure neurological function: normal Post-procedure range of motion: unchanged       Medications Ordered in ED Medications  traMADol (ULTRAM) tablet 50 mg (50 mg Oral Given 12/11/22 1954)  acetaminophen (TYLENOL) tablet 1,000 mg (1,000 mg Oral Given 12/11/22 1953)    ED Course/ Medical Decision Making/ A&P Clinical Course as of 12/11/22 2026  Fri Dec 11, 2022  1943 Spoke with Dr Conchita Paris of neurosurgery who I spoke with on the phone regarding patient's old C-spine fractures recommending CTA.  He said it is not required unless we are concerned for acute stroke however my exam is not concerning for stroke or vertebral dissection additionally, if she was having these issues they would only recommend starting antiplatelets regardless which she is currently on daily with clopidogrel. [VB]    Clinical Course User Index [VB] Mardene Sayer, MD   {                           Medical Decision Making SHAHAD MAZUREK is a 89 y.o. female.  With PMH of A-fib on Eliquis, CHF, CAD who presents with left elbow and arm pain after mechanical fall earlier today.  Of note, she had a fall in early December 2023 where she was found to have C-spine fractures as well as maxillary fractures which she still remains in a collar for.   Her only complaint today is left elbow pain, left upper extremity is neurovascularly intact, no concern for ischemic limb.  X-ray obtained which I personally reviewed concerning for acute fracture of radial head neck  junction with mild posterior displacement of radial neck.  Will place patient in posterior long-arm splint and provide sling and outpatient orthopedics follow-up as well as analgesia here.  She has no other acute new pain or findings on her physical exam.  She did have further imaging as well as pelvis and left hip film which showed no acute fractures.  She did have CT head which I personally reviewed no evidence of ICH.  CT C-spine shows old known fractures of C5/6/7.  She remains in a collar for this and has follow-up with neurosurgery outpatient.  She also has T3 compression fracture which is also old. Spoke with Dr Conchita Paris of neurosurgery who I spoke with on the phone regarding patient's old C-spine fractures recommending CTA.  He said it is not required unless we are concerned for acute stroke however my exam is not concerning for stroke or vertebral dissection additionally, if she was having these issues they would only recommend starting antiplatelets regardless which she is currently on daily with clopidogrel.  Patient discontinuing Eliquis after cardiologist recommendation.  She is discharging home with follow-up with orthopedist for new left radial fracture.  Strict return precautions discussed.   Amount and/or Complexity of Data Reviewed Radiology: ordered.  Risk OTC drugs. Prescription drug management.    Final Clinical Impression(s) / ED  Diagnoses Final diagnoses:  Fall, initial encounter  Closed fracture of proximal end of left radius, unspecified fracture morphology, initial encounter    Rx / DC Orders ED Discharge Orders          Ordered    traMADol (ULTRAM) 50 MG tablet  Every 6 hours PRN        12/11/22 2023              Mardene Sayer, MD 12/11/22 2026    Mardene Sayer, MD 12/11/22 2038

## 2022-12-11 NOTE — ED Triage Notes (Signed)
Pt here from home with c/o fall walking to reach for her walker no loc landing on her hip and elbow and hitting her head slightly

## 2022-12-11 NOTE — Progress Notes (Signed)
Orthopedic Tech Progress Note Patient Details:  [name] 01/28/1928 [id number]  Ortho Devices Type of Ortho Device: Arm sling, Long arm splint Ortho Device/Splint Location: Left arm Ortho Device/Splint Interventions: Application   Post Interventions Patient Tolerated: Well  Linus Salmons Alexzia Kasler 12/11/2022, 7:58 PM

## 2022-12-11 NOTE — Progress Notes (Signed)
Established Patient Visit  Patient: [name]   DOB: [birth year]   89 y.o. Female  MRN: [id number] Visit Date: 12/12/2022  Subjective:    Chief Complaint  Patient presents with   [facility] F/u  Pneumonia / RSV / Fall    HPI Accompanied by [place] daughter.  Community acquired pneumonia of right lung Admission 12/01/2022 Discharge 12/02/2022 RLL Pneumonia due to RSV, treated with prednisone, discharged with Azithromycin x5days and cefdinir x 3days. Appt with cardiology: 02/12/2023. Use of soft neck collar, has not schedule appt with [place] neurosurgeon Reports persistent SOB with exertion. No cough, no fever  Continue nebulizer treatment 3x/day. Start breathing exercise 2-3x/day.(10 repetitions each time) repeat CXR in 2weeks  Coronary artery disease involving native coronary artery of native heart with angina pectoris ([place]) Eliquis discontinued by cardiology. Reports intermittent chest pain, chronic, no change. Current use of coreg, amiodarone, and plavix. Advised to f/up with cardiology  BP Readings from Last 3 Encounters:  12/11/22 133/65  12/11/22 118/70  12/02/22 (!) 150/65     Reviewed medical, surgical, and social history today  Medications: Outpatient Medications Prior to Visit  Medication Sig   acetaminophen (TYLENOL) 325 MG tablet Take 2 tablets (650 mg total) by mouth every 6 (six) hours as needed for mild pain (or Fever >/= 101).   Acetaminophen-DM (CORICIDIN HBP COLD/COUGH/FLU PO) Take 1 tablet by mouth every 6 (six) hours as needed (cold symptoms).   albuterol (VENTOLIN HFA) 108 (90 Base) MCG/ACT inhaler Inhale 1-2 puffs into the lungs every 6 (six) hours as needed for wheezing or shortness of breath.   amiodarone (PACERONE) 200 MG tablet Take 1/2 tablet by mouth daily (Patient taking differently: Take 100 mg by mouth daily. Take 1/2 tablet by mouth daily)   carvedilol (COREG) 3.125 MG tablet Take 1 tablet (3.125 mg  total) by mouth 2 (two) times daily with a meal.   clopidogrel (PLAVIX) 75 MG tablet Take 75 mg by mouth daily.   ezetimibe (ZETIA) 10 MG tablet TAKE 1 TABLET BY MOUTH  DAILY   furosemide (LASIX) 20 MG tablet Take 1 tablet by mouth three times per week (Patient taking differently: Take 20 mg by mouth See admin instructions. Take 1 tablet by mouth three times per week on Mon Wed and Fridays)   ipratropium-albuterol (DUONEB) 0.5-2.5 (3) MG/3ML SOLN Take 3 mLs by nebulization 3 (three) times daily.   Multiple Vitamins-Minerals (PRESERVISION AREDS 2+MULTI VIT PO) Take 1 capsule by mouth in the morning and at bedtime.   spironolactone (ALDACTONE) 25 MG tablet Take 1 tablet (25 mg total) by mouth daily.   [DISCONTINUED] guaiFENesin (ROBITUSSIN) 100 MG/5ML liquid Take 10 mLs by mouth every 6 (six) hours as needed for cough or to loosen phlegm. (Patient not taking: Reported on 12/11/2022)   No facility-administered medications prior to visit.   Reviewed past medical and social history.   ROS per HPI above  Last CBC Lab Results  Component Value Date   WBC 9.0 12/11/2022   HGB 13.2 12/11/2022   HCT 39.6 12/11/2022   MCV 95.1 12/11/2022   MCH 31.6 12/02/2022   RDW 16.9 (H) 12/11/2022   PLT 263.0 95/28/4132   Last metabolic panel Lab Results  Component Value Date   GLUCOSE 105 (H) 12/11/2022   NA 135 12/11/2022   K 4.1 12/11/2022   CL 98 12/11/2022   CO2 30 12/11/2022  BUN 26 (H) 12/11/2022   CREATININE 0.91 12/11/2022   GFRNONAA >60 12/02/2022   CALCIUM 9.3 12/11/2022   PROT 7.4 11/30/2022   ALBUMIN 3.9 11/30/2022   BILITOT 0.8 11/30/2022   ALKPHOS 67 11/30/2022   AST 23 11/30/2022   ALT 12 11/30/2022   ANIONGAP 6 12/02/2022      Objective:  BP 118/70 (BP Location: Right Arm, Patient Position: Sitting, Cuff Size: Small)   Pulse 74   Temp (!) 97.3 F (36.3 C) (Temporal)   Ht 4\' 10"  (1.473 m)   Wt 112 lb (50.8 kg)   SpO2 96%   BMI 23.41 kg/m      Physical Exam Vitals  reviewed.  Cardiovascular:     Rate and Rhythm: Normal rate and regular rhythm.     Pulses: Normal pulses.     Heart sounds: Normal heart sounds.  Pulmonary:     Effort: Pulmonary effort is normal. No respiratory distress.     Comments: Diminished lung sounds in bilateral lung bases Musculoskeletal:     Right lower leg: No edema.     Left lower leg: No edema.  Neurological:     Mental Status: She is alert and oriented to person, place, and time.     Results for orders placed or performed in visit on 55/73/22  Basic metabolic panel  Result Value Ref Range   Sodium 135 135 - 145 mEq/L   Potassium 4.1 3.5 - 5.1 mEq/L   Chloride 98 96 - 112 mEq/L   CO2 30 19 - 32 mEq/L   Glucose, Bld 105 (H) 70 - 99 mg/dL   BUN 26 (H) 6 - 23 mg/dL   Creatinine, Ser 0.91 0.40 - 1.20 mg/dL   GFR 54.59 (L) >60.00 mL/min   Calcium 9.3 8.4 - 10.5 mg/dL  CBC with Differential/Platelet  Result Value Ref Range   WBC 9.0 4.0 - 10.5 K/uL   RBC 4.16 3.87 - 5.11 Mil/uL   Hemoglobin 13.2 12.0 - 15.0 g/dL   HCT 39.6 36.0 - 46.0 %   MCV 95.1 78.0 - 100.0 fl   MCHC 33.3 30.0 - 36.0 g/dL   RDW 16.9 (H) 11.5 - 15.5 %   Platelets 263.0 150.0 - 400.0 K/uL   Neutrophils Relative % 74.1 43.0 - 77.0 %   Lymphocytes Relative 17.5 12.0 - 46.0 %   Monocytes Relative 6.9 3.0 - 12.0 %   Eosinophils Relative 1.2 0.0 - 5.0 %   Basophils Relative 0.3 0.0 - 3.0 %   Neutro Abs 6.7 1.4 - 7.7 K/uL   Lymphs Abs 1.6 0.7 - 4.0 K/uL   Monocytes Absolute 0.6 0.1 - 1.0 K/uL   Eosinophils Absolute 0.1 0.0 - 0.7 K/uL   Basophils Absolute 0.0 0.0 - 0.1 K/uL      Assessment & Plan:    Problem List Items Addressed This Visit       Cardiovascular and Mediastinum   Coronary artery disease involving native coronary artery of native heart with angina pectoris ([place])    Eliquis discontinued by cardiology. Reports intermittent chest pain, chronic, no change. Current use of coreg, amiodarone, and plavix. Advised to f/up with  cardiology  BP Readings from Last 3 Encounters:  12/11/22 133/65  12/11/22 118/70  12/02/22 (!) 150/65           Respiratory   Community acquired pneumonia of right lung - Primary    Admission 12/01/2022 Discharge 12/02/2022 [facility] Pneumonia due to RSV, treated with prednisone, discharged with Azithromycin  x5days and cefdinir x 3days. Appt with cardiology: 02/12/2023. Use of soft neck collar, has not schedule appt with [place] neurosurgeon Reports persistent SOB with exertion. No cough, no fever  Continue nebulizer treatment 3x/day. Start breathing exercise 2-3x/day.(10 repetitions each time) repeat CXR in 2weeks      Relevant Orders   Basic metabolic panel (Completed)   CBC with Differential/Platelet (Completed)   DG Chest 2 View   Return in about 4 weeks (around 01/08/2023) for pneumonia.     [name], NP

## 2022-12-12 NOTE — Assessment & Plan Note (Signed)
Admission 12/01/2022 Discharge 12/02/2022 [facility] Pneumonia due to RSV, treated with prednisone, discharged with Azithromycin x5days and cefdinir x 3days. Appt with cardiology: 02/12/2023. Use of soft neck collar, has not schedule appt with [place] neurosurgeon Reports persistent SOB with exertion. No cough, no fever  Continue nebulizer treatment 3x/day. Start breathing exercise 2-3x/day.(10 repetitions each time) repeat CXR in 2weeks

## 2022-12-12 NOTE — Assessment & Plan Note (Addendum)
Eliquis discontinued by cardiology. Reports intermittent chest pain, chronic, no change. Current use of coreg, amiodarone, and plavix. Advised to f/up with cardiology  BP Readings from Last 3 Encounters:  12/11/22 133/65  12/11/22 118/70  12/02/22 (!) 150/65

## 2022-12-14 ENCOUNTER — Telehealth: Payer: Self-pay

## 2022-12-14 NOTE — Telephone Encounter (Signed)
Transition Care Management Unsuccessful Follow-up Telephone Call  Date of discharge and from where:  12/11/22 [facility] ED. Dx: Fall  Attempts:  1st Attempt  Reason for unsuccessful TCM follow-up call:  Left voice message

## 2022-12-15 ENCOUNTER — Telehealth: Payer: Self-pay | Admitting: Cardiovascular Disease

## 2022-12-15 ENCOUNTER — Other Ambulatory Visit: Payer: Self-pay | Admitting: *Deleted

## 2022-12-15 MED ORDER — NITROGLYCERIN 0.4 MG SL SUBL: 0.4000 mg | SUBLINGUAL_TABLET | SUBLINGUAL | 3 refills | Status: DC | PRN

## 2022-12-15 NOTE — Telephone Encounter (Signed)
Spoke with patient and daughter regarding increasing episodes of chest pain, the most recent at 1 am this morning.  Patient states it's pain in chest and does not radiate. She denies nausea but sometimes feels SOB. She takes nitro and typically pain resolves after 1-2 doses. She has not checked BP today.   Daughter would like to discuss medications and possible treatments to decrease episodes. Scheduled with Dr Acie Fredrickson for 12/22/22. Advised is pain re-occurs and does not resolve with nitro, she develops additional symptoms, SOB, N/V or palpitations she needs to call EMS or go the ED for evaluation. Patient and daughter verbalized understanding and had no questions.

## 2022-12-15 NOTE — Telephone Encounter (Signed)
Pt c/o of Chest Pain: STAT if CP now or developed within 24 hours  1. Are you having CP right now? no  2. Are you experiencing any other symptoms (ex. SOB, nausea, vomiting, sweating)? no  3. How long have you been experiencing CP? Last night she had chest pain.   4. Is your CP continuous or coming and going? It was continuous when she had the pain.   5. Have you taken Nitroglycerin? Her daughter gave her 1 nitroglycerin pill last night and after 30 mins if went away.    This is the second time she has had chest pain in the past week daughter states.  ?

## 2022-12-15 NOTE — Telephone Encounter (Signed)
*  STAT* If patient is at the pharmacy, call can be transferred to refill team.   1. Which medications need to be refilled? (please list name of each medication and dose if known)  nitroglycerin 0.4 mg  2. Which pharmacy/location (including street and city if local pharmacy) is medication to be sent to? [id number] - [place], [place] - [address].   3. Do they need a 30 day or 90 day supply? 30 day

## 2022-12-18 NOTE — Telephone Encounter (Signed)
Transition Care Management Unsuccessful Follow-up Telephone Call  Date of discharge and from where:  12/11/22 [facility] ED. Dx: Fall   Attempts:  2nd Attempt  Reason for unsuccessful TCM follow-up call:  No answer/busy

## 2022-12-20 ENCOUNTER — Encounter: Payer: Self-pay | Admitting: Cardiovascular Disease
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
## 2022-12-21 NOTE — Telephone Encounter (Signed)
Transition Care Management Follow-up Telephone Call Date of discharge and from where: 12/11/22 [facility] ED. Dx: Fall   How have you been since you were released from the hospital? She's doing ok, she did break her arm Any questions or concerns? No  Items Reviewed: Did the pt receive and understand the discharge instructions provided? Yes  Medications obtained and verified? Yes  Other? No  Any new allergies since your discharge? No  Dietary orders reviewed? No Do you have support at home? Yes   Home Care and Equipment/Supplies: Were home health services ordered? no If so, what is the name of the agency? N/a  Has the agency set up a time to come to the patient's home? no Were any new equipment or medical supplies ordered?  No What is the name of the medical supply agency? N/a Were you able to get the supplies/equipment? no Do you have any questions related to the use of the equipment or supplies? No  Functional Questionnaire: (I = Independent and D = Dependent) ADLs: D  Bathing/Dressing- D  Meal Prep- D  Eating- D  Maintaining continence- D  Transferring/Ambulation- D  Managing Meds- D  Follow up appointments reviewed:  PCP Hospital f/u appt confirmed? No  Scheduled to see N/A on N/A @ N/A. [facility] f/u appt confirmed? Yes  Scheduled to see Lois Huxley on 12/21/22 @ 3:00pm. Are transportation arrangements needed? No  If their condition worsens, is the pt aware to call PCP or go to the Emergency Dept.? Yes Was the patient provided with contact information for the PCP's office or ED? Yes Was to pt encouraged to call back with questions or concerns? Yes

## 2022-12-22 ENCOUNTER — Ambulatory Visit: Payer: Medicare Other | Attending: Cardiovascular Disease | Admitting: Cardiovascular Disease

## 2022-12-22 ENCOUNTER — Encounter: Payer: Self-pay | Admitting: Cardiovascular Disease

## 2022-12-22 VITALS — BP 108/54 | HR 72 | Ht <= 58 in | Wt 113.0 lb

## 2022-12-22 DIAGNOSIS — I25119 Atherosclerotic heart disease of native coronary artery with unspecified angina pectoris: Secondary | ICD-10-CM

## 2022-12-22 DIAGNOSIS — I10 Essential (primary) hypertension: Secondary | ICD-10-CM | POA: Diagnosis not present

## 2022-12-22 DIAGNOSIS — Z79899 Other long term (current) drug therapy: Secondary | ICD-10-CM

## 2022-12-22 DIAGNOSIS — E782 Mixed hyperlipidemia: Secondary | ICD-10-CM

## 2022-12-22 MED ORDER — REPATHA 140 MG/ML ~~LOC~~ SOSY: 140.0000 mg | PREFILLED_SYRINGE | SUBCUTANEOUS | 3 refills | Status: DC

## 2022-12-22 MED ORDER — CARVEDILOL 3.125 MG PO TABS: 3.1250 mg | ORAL_TABLET | Freq: Two times a day (BID) | ORAL | 3 refills | Status: DC

## 2022-12-22 MED ORDER — NITROGLYCERIN 0.4 MG SL SUBL: 0.4000 mg | SUBLINGUAL_TABLET | SUBLINGUAL | 1 refills | Status: DC | PRN

## 2022-12-22 MED ORDER — ISOSORBIDE MONONITRATE ER 30 MG PO TB24: 30.0000 mg | ORAL_TABLET | Freq: Every day | ORAL | 3 refills | Status: DC

## 2022-12-22 MED ORDER — SPIRONOLACTONE 25 MG PO TABS: 25.0000 mg | ORAL_TABLET | Freq: Every day | ORAL | 3 refills | Status: DC

## 2022-12-22 NOTE — Patient Instructions (Addendum)
Medication Instructions:  START Isosorbide Mononitrate 30mg  daily REFILLED Nitroglycerin #100 tabs REFILLED Spironolactone and Carvedilol (90 days) REFILLED Repatha 140mg  (90 days) *If you need a refill on your cardiac medications before your next appointment, please call your pharmacy*   Lab Work: Lipids, ALT, BMET today If you have labs (blood work) drawn today and your tests are completely normal, you will receive your results only by: [place] (if you have MyChart) OR A paper copy in the mail If you have any lab test that is abnormal or we need to change your treatment, we will call you to review the results.   Testing/Procedures: NONE   Follow-Up: At [facility], you and your health needs are our priority.  As part of our continuing mission to provide you with exceptional heart care, we have created designated Provider Care Teams.  These Care Teams include your primary Cardiologist (physician) and Advanced Practice Providers (APPs -  Physician Assistants and Nurse Practitioners) who all work together to provide you with the care you need, when you need it.  Your next appointment:   3 month(s)  Provider:   [name], MD

## 2022-12-23 ENCOUNTER — Telehealth: Payer: Self-pay | Admitting: Pharmacist

## 2022-12-23 ENCOUNTER — Encounter: Payer: Self-pay | Admitting: Nurse Practitioner

## 2022-12-23 LAB — BASIC METABOLIC PANEL
BUN/Creatinine Ratio: 22 (ref 12–28)
BUN: 17 mg/dL (ref 10–36)
CO2: 26 mmol/L (ref 20–29)
Calcium: 9.2 mg/dL (ref 8.7–10.3)
Chloride: 99 mmol/L (ref 96–106)
Creatinine, Ser: 0.78 mg/dL (ref 0.57–1.00)
Glucose: 107 mg/dL — ABNORMAL HIGH (ref 70–99)
Potassium: 4.2 mmol/L (ref 3.5–5.2)
Sodium: 138 mmol/L (ref 134–144)
eGFR: 71 mL/min/{1.73_m2} (ref >59)

## 2022-12-23 LAB — LIPID PANEL
Chol/HDL Ratio: 5 ratio — ABNORMAL HIGH (ref 0.0–4.4)
Cholesterol, Total: 166 mg/dL (ref 100–199)
HDL: 33 mg/dL — ABNORMAL LOW (ref >39)
LDL Chol Calc (NIH): 110 mg/dL — ABNORMAL HIGH (ref 0–99)
Triglycerides: 126 mg/dL (ref 0–149)
VLDL Cholesterol Cal: 23 mg/dL (ref 5–40)

## 2022-12-23 LAB — ALT: ALT: 6 IU/L (ref 0–32)

## 2022-12-23 NOTE — Telephone Encounter (Signed)
Received fax from [facility] clarifying prescription. Changed Repatha solution to sureclix and faxed back.

## 2023-01-11 ENCOUNTER — Ambulatory Visit (INDEPENDENT_AMBULATORY_CARE_PROVIDER_SITE_OTHER): Payer: Medicare Other | Admitting: Nurse Practitioner

## 2023-01-11 ENCOUNTER — Encounter: Payer: Self-pay | Admitting: Nurse Practitioner

## 2023-01-11 VITALS — BP 130/72 | HR 72 | Temp 98.2°F | Ht <= 58 in | Wt 113.6 lb

## 2023-01-11 DIAGNOSIS — J189 Pneumonia, unspecified organism: Secondary | ICD-10-CM | POA: Diagnosis not present

## 2023-01-11 DIAGNOSIS — R413 Other amnesia: Secondary | ICD-10-CM

## 2023-01-11 MED ORDER — TRAMADOL-ACETAMINOPHEN 37.5-325 MG PO TABS: 0.5000 | ORAL_TABLET | Freq: Every day | ORAL | 0 refills | Status: DC | PRN

## 2023-01-11 NOTE — Patient Instructions (Addendum)
Go to [facility] for repeat CXR. You will be contacted to schedule appt with neurology.  Management of Memory Problems  There are some general things you can do to help manage your memory problems.  Your memory may not in fact recover, but by using techniques and strategies you will be able to manage your memory difficulties better.  1)  Establish a routine. Try to establish and then stick to a regular routine.  By doing this, you will get used to what to expect and you will reduce the need to rely on your memory.  Also, try to do things at the same time of day, such as taking your medication or checking your calendar first thing in the morning. Think about think that you can do as a part of a regular routine and make a list.  Then enter them into a daily planner to remind you.  This will help you establish a routine.  2)  Organize your environment. Organize your environment so that it is uncluttered.  Decrease visual stimulation.  Place everyday items such as keys or cell phone in the same place every day (ie.  Basket next to front door) Use post it notes with a brief message to yourself (ie. Turn off light, lock the door) Use labels to indicate where things go (ie. Which cupboards are for food, dishes, etc.) Keep a notepad and pen by the telephone to take messages  3)  Memory Aids A diary or journal/notebook/daily planner Making a list (shopping list, chore list, to do list that needs to be done) Using an alarm as a reminder (kitchen timer or cell phone alarm) Using cell phone to store information (Notes, Calendar, Reminders) Calendar/White board placed in a prominent position Post-it notes  In order for memory aids to be useful, you need to have good habits.  It's no good remembering to make a note in your journal if you don't remember to look in it.  Try setting aside a certain time of day to look in journal.  4)  Improving mood and managing fatigue. There may be other factors that  contribute to memory difficulties.  Factors, such as anxiety, depression and tiredness can affect memory. Regular gentle exercise can help improve your mood and give you more energy. Simple relaxation techniques may help relieve symptoms of anxiety Try to get back to completing activities or hobbies you enjoyed doing in the past. Learn to pace yourself through activities to decrease fatigue. Find out about some local support groups where you can share experiences with others. Try and achieve 7-8 hours of sleep at night.
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
## 2023-01-11 NOTE — Assessment & Plan Note (Signed)
Resolved cough, fever, and SOB. Repeat CXR today

## 2023-01-11 NOTE — Progress Notes (Signed)
Established Patient Visit  Patient: [name]   DOB: 09-11-1928   89 y.o. Female  MRN: [id number] Visit Date: 01/11/2023  Subjective:    Chief Complaint  Patient presents with   Pneumonia    F/u - Pt is feeling fine    HPI Accompanied by grand daughter today.  Community acquired pneumonia of right lung Resolved cough, fever, and SOB. Repeat CXR today  Memory difficulty Grand daughter reports episodes of memory lapse: unable to remember family member names or previous conversations. Progressive decline since death of daughter about 2month ago. Also has multiple head injury due to syncopal episode at home. Recent CT head 11/2022: Moderate patchy and confluent hypoattenuation within the cerebral white matter, nonspecific but compatible with chronic small vascular disease. MMSE screen today: 25/30 MMSE screen 2022: 21/30 LDL not at goal, on zetia and repatha BP at goal Normal TSH hgbA1c at 5.8%  Possible vascular dementia vs depression made worse with hearing loss She declined referral to audiology and psychology. Entered referral to neurology Advised to start reading or word puzzles or suduko      01/11/2023   11:43 AM 03/20/2022   11:23 AM 09/19/2021    4:38 PM  Depression screen PHQ 2/9  Decreased Interest 3 3 0  Down, Depressed, Hopeless 0 0 0  PHQ - 2 Score 3 3 0  Altered sleeping 2 2 1  $ Tired, decreased energy 0 1 1  Change in appetite 2 0 0  Feeling bad or failure about yourself  0 0 0  Trouble concentrating  3 1  Moving slowly or fidgety/restless 0 0 0  Suicidal thoughts 0 0 0  PHQ-9 Score 7 9 3  $ Difficult doing work/chores Somewhat difficult Not difficult at all        03/20/2022   10:30 AM 09/19/2021    2:50 PM  MMSE - Mini Mental State Exam  Orientation to time 3 3  Orientation to Place 5 5  Registration 3 3  Attention/ Calculation 0 0  Recall 2 2  Language- name 2 objects 2 2  Language- repeat 1 1  Language- follow 3 step  command 3 3  Language- read & follow direction 1 1  Write a sentence 0 0  Copy design 1 1  Total score 21 21    Reviewed medical, surgical, and social history today  Medications: Outpatient Medications Prior to Visit  Medication Sig   acetaminophen (TYLENOL) 325 MG tablet Take 2 tablets (650 mg total) by mouth every 6 (six) hours as needed for mild pain (or Fever >/= 101).   albuterol (VENTOLIN HFA) 108 (90 Base) MCG/ACT inhaler Inhale 1-2 puffs into the lungs every 6 (six) hours as needed for wheezing or shortness of breath.   carvedilol (COREG) 3.125 MG tablet Take 1 tablet (3.125 mg total) by mouth 2 (two) times daily with a meal.   clopidogrel (PLAVIX) 75 MG tablet Take 75 mg by mouth daily.   Evolocumab (REPATHA) 140 MG/ML SOSY Inject 140 mg into the skin every 14 (fourteen) days.   ezetimibe (ZETIA) 10 MG tablet TAKE 1 TABLET BY MOUTH  DAILY   furosemide (LASIX) 20 MG tablet Take 1 tablet by mouth three times per week (Patient taking differently: Take 20 mg by mouth See admin instructions. Take 1 tablet by mouth three times per week on Mon Wed and Fridays)   isosorbide mononitrate (IMDUR) 30 MG 24  hr tablet Take 1 tablet (30 mg total) by mouth daily.   Multiple Vitamins-Minerals (PRESERVISION AREDS 2+MULTI VIT PO) Take 1 capsule by mouth in the morning and at bedtime.   nitroGLYCERIN (NITROSTAT) 0.4 MG SL tablet Place 1 tablet (0.4 mg total) under the tongue every 5 (five) minutes as needed for chest pain.   spironolactone (ALDACTONE) 25 MG tablet Take 1 tablet (25 mg total) by mouth daily.   [DISCONTINUED] Acetaminophen-DM (CORICIDIN HBP COLD/COUGH/FLU PO) Take 1 tablet by mouth every 6 (six) hours as needed (cold symptoms).   [DISCONTINUED] traMADol (ULTRAM) 50 MG tablet Take 0.5 tablets (25 mg total) by mouth every 6 (six) hours as needed for up to 16 doses for severe pain.   ipratropium-albuterol (DUONEB) 0.5-2.5 (3) MG/3ML SOLN Take 3 mLs by nebulization 3 (three) times daily.  (Patient not taking: Reported on 01/11/2023)   [DISCONTINUED] amiodarone (PACERONE) 200 MG tablet Take 1/2 tablet by mouth daily (Patient not taking: Reported on 01/11/2023)   No facility-administered medications prior to visit.   Reviewed past medical and social history.   ROS per HPI above      Objective:  BP 130/72 (BP Location: Left Arm, Patient Position: Sitting, Cuff Size: Normal)   Pulse 72   Temp 98.2 F (36.8 C) (Temporal)   Ht 4' 10"$  (1.473 m)   Wt 113 lb 9.6 oz (51.5 kg)   SpO2 97%   BMI 23.74 kg/m      Physical Exam Vitals reviewed.  Cardiovascular:     Rate and Rhythm: Normal rate and regular rhythm.     Pulses: Normal pulses.     Heart sounds: Normal heart sounds.  Musculoskeletal:     Right lower leg: No edema.     Left lower leg: No edema.  Neurological:     Mental Status: She is alert and oriented to person, place, and time.     Cranial Nerves: No cranial nerve deficit.  Psychiatric:        Mood and Affect: Mood normal.        Behavior: Behavior normal.        Thought Content: Thought content normal.     No results found for any visits on 01/11/23.    Assessment & Plan:    Problem List Items Addressed This Visit       Respiratory   Community acquired pneumonia of right lung - Primary    Resolved cough, fever, and SOB. Repeat CXR today        Other   Memory difficulty    Grand daughter reports episodes of memory lapse: unable to remember family member names or previous conversations. Progressive decline since death of daughter about 20month ago. Also has multiple head injury due to syncopal episode at home. Recent CT head 11/2022: Moderate patchy and confluent hypoattenuation within the cerebral white matter, nonspecific but compatible with chronic small vascular disease. MMSE screen today: 25/30 MMSE screen 2022: 21/30 LDL not at goal, on zetia and repatha BP at goal Normal TSH hgbA1c at 5.8%  Possible vascular dementia vs depression  made worse with hearing loss She declined referral to audiology and psychology. Entered referral to neurology Advised to start reading or word puzzles or suduko        Relevant Orders   Ambulatory referral to Neurology   Return in about 6 months (around 07/12/2023) for HTN, hyperlipidemia (fasting).     CWilfred Lacy NP

## 2023-01-13 ENCOUNTER — Encounter: Payer: Self-pay | Admitting: Physician Assistant

## 2023-01-14 ENCOUNTER — Other Ambulatory Visit: Payer: Self-pay | Admitting: Nurse Practitioner

## 2023-01-20 ENCOUNTER — Ambulatory Visit: Payer: Medicare Other | Admitting: Cardiovascular Disease

## 2023-01-28 ENCOUNTER — Ambulatory Visit: Payer: Medicare Other | Admitting: Physician Assistant

## 2023-01-28 ENCOUNTER — Encounter: Payer: Self-pay | Admitting: Physician Assistant

## 2023-01-28 ENCOUNTER — Ambulatory Visit: Payer: Medicare Other

## 2023-01-28 VITALS — BP 108/56 | HR 60 | Resp 18 | Ht <= 58 in | Wt 114.0 lb

## 2023-01-28 DIAGNOSIS — R413 Other amnesia: Secondary | ICD-10-CM | POA: Diagnosis not present

## 2023-01-28 NOTE — Progress Notes (Addendum)
Assessment/Plan:    The patient is seen in neurologic consultation at the request of Nche, Charlene Brooke, NP for the evaluation of memory.  Zani Cashin Vea is a very pleasant 89 y.o. year old RH female with a history of hypertension, hyperlipidemia,arthritis, GERD, Vit D deficiency, CHF,  seen today for evaluation of memory loss. MoCA today is 11 (1 point added due to education) .MRI brain from 08/2022 personally reviewed was remarkable for progressive advanced atrophy and diffuse white matter disease and  moderate chronic microvascular ischemia.  Given her score and adverse risk factors, age, discussed with the family and antidementia medication is not advised weighing risks (side effects) versus potential benefits.  Family agrees.  She does need help with some of her ADLs and mobility at this time.  Dementia likely due to Alzheimer's Disease and Vascular etiology.     Recommend good control of cardiovascular risk factors.   Continue to control mood as per PCP Due to cognitive status, and serious side effects regarding the medication, advanced age, antidementia medication is not recommended.  Family is in agreement. Folllow up  as needed  Subjective:    The patient is accompanied by her granddaughter  who supplements the history.    How long did patient have memory difficulties?  Patient was recently hospitalized around October of last year, and she does not remember this hospitalization.  Of note, since that time, her memory reported to be worse, unable to remember recent conversations and names (although not remembering names is a thing in the family).  She is more repetitive, asking the same questions and telling the same stories. Disoriented when walking into a room?  Patient denies    Leaving objects in unusual places?   denies   Wandering behavior? denies   Any personality changes since last visit? denies   Any worsening depression?:  She has lost her daughter 2 months ago, and  cannot remember her daughter's name "is the one that died ".  She did grief counseling, and is doing better but still unable to recall all the details.   Hallucinations or paranoia?  denies   Seizures?   denies    Any sleep changes? Insomnia . She does not feel rested when sleeping. She naps during the day  Denies vivid dreams, REM behavior or sleepwalking   Sleep apnea? denies   Any hygiene concerns?  denies   Independent of bathing and dressing?  Needs help to take a bath and get dressed, has a shower chair Does the patient need help with medications?  Granddaughter is in charge Who is in charge of the finances?  Daughter  is in charge    Any changes in appetite?  "Gotten better, at least 2 full meals a day "   Patient have trouble swallowing?  denies   Does the patient cook? No Any headaches?  denies   Chronic back pain  denies  "I can tell is going to rain" Ambulates with difficulty?  She has had several falls and is doing physical therapy.  Uses a walker but due to humeral fracture and left wrist fracture and she is using a wheelchair at this time Recent falls or head injuries? Endorsed, minor head trauma due to fall on 11/2022 without LOC.  She sustained orbital fracture and C5-7 fracture over the last few months. Unilateral weakness, numbness or tingling?  denies   Any tremors?  denies   Any anosmia?  denies   Any incontinence of urine?  denies   Any bowel dysfunction?    denies      Patient lives with her family History of heavy alcohol intake? denies   History of heavy tobacco use?   denies   Family history of dementia?    no  Dose patient drive? 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  Initial visit 01/26/20 Dr. Delice Lesch for Transient Alteration of Awareness   This is a pleasant 89 year old right-handed woman with a history of hypertension, CHF, presenting for evaluation of an episode of confusion that occurred last 01/02/2020. She is highly functional, living  alone still working as a Regulatory affairs officer. On 01/02/20, she showed up to work 1.5 hours late and was noted to be acting unusual. She came in with her pajama top on, 2 socks on her right foot, and 1 sock on her left foot. She punched in like she usually does. She usually sits down and starts working, but co-workers noted she just sat there and was confused when they tried to talk to her. They called her daughter [name], when she arrived, she was looking at her daughter "like I was stupid," needing to be asked the same question 2-3 times, but answering correctly. She had no recollection of how shehing for about 1-1.5 hours. Sleep has been better recently. Her daughter also noted that 1.5-2 weeks prior to the episode, she had a fall where she hit her head, no loss of consciousness. She has been overall quite independent and both of them feel her memory is good. She denies getting lost driving except for one time 6 months ago when she was sick and took cold medicine, causing her to get confused and turn on to the railroad tracks when there was a truck behind her. She is overall good with taking her medications, but forgot to take it the day prior and the d got to work. She had forgotten her dentures and did not eat breakfast or take her medications. Her daughter brought her to her PCP the day after, CBC, BMP and urinalysis were unremarkable. I personally reviewed head CT without contrast done 01/05/20 which did not show any acute changes, there was diffuse volume loss and patchy confluent bilateral white matter hypodensity. Carotid dopplers did not show significant stenosis. Her daughter reports that she remained slightly confused for another 5 days but getting a little better each day. She is now back to baseline. They note that she was not sleeping well the night before, she has had cough after eating for a couple of months, then the evening before she was cougay of the event. [name] has had to write her bills due to  changes in her handwriting, but otherwise she can do it with no problems. [name] has not noticed any staring episodes. She denies any olfactory/gustatory hallucinations, deja vu, rising epigastric sensation, focal numbness/tingling/weakness, myoclonic jerks. No headaches, dizziness, vision changes, bowel/bladder dysfunction. No alcohol use. Her sister had a brain tumor, her mother had "mini-strokes constantly before she passed." She had a normal birth and early development.  There is no history of febrile convulsions, CNS infections such as meningitis/encephalitis, significant traumatic brain injury, neurosurgical procedures, or family history of seizures.    Allergies  Allergen Reactions   Codeine Cough   Crestor [Rosuvastatin Calcium] Cough   Entresto [Sacubitril-Valsartan]     cough   Lisinopril Cough   Pantoprazole Sodium Cough   Pravastatin Cough   Simvastatin Cough  Current Outpatient Medications  Medication Instructions   acetaminophen (TYLENOL) 650 mg, Oral, Every 6 hours PRN   albuterol (VENTOLIN HFA) 108 (90 Base) MCG/ACT inhaler 1-2 puffs, Inhalation, Every 6 hours PRN   carvedilol (COREG) 3.125 mg, Oral, 2 times daily with meals   clopidogrel (PLAVIX) 75 mg, Oral, Daily with breakfast   ezetimibe (ZETIA) 10 mg, Oral, Daily   furosemide (LASIX) 20 MG tablet Take 1 tablet by mouth three times per week   ipratropium-albuterol (DUONEB) 0.5-2.5 (3) MG/3ML SOLN 3 mLs, Nebulization, 3 times daily   isosorbide mononitrate (IMDUR) 30 mg, Oral, Daily   Multiple Vitamins-Minerals (PRESERVISION AREDS 2+MULTI VIT PO) 1 capsule, Oral, 2 times daily   nitroGLYCERIN (NITROSTAT) 0.4 mg, Sublingual, Every 5 min PRN   Repatha 140 mg, Subcutaneous, Every 14 days   spironolactone (ALDACTONE) 25 mg, Oral, Daily   traMADol-acetaminophen (ULTRACET) 37.5-325 MG tablet 0.5-1 tablets, Oral, Daily PRN     VITALS:   Vitals:   01/28/23 1315  BP: (!) 108/56  Pulse: 60  Resp: 18  SpO2: 97%   Weight: 114 lb (51.7 kg)  Height: 4\' 10"  (1.473 m)    PHYSICAL EXAM   HEENT:  Normocephalic, atraumatic. The mucous membranes are moist. The superficial temporal arteries are without ropiness or tenderness. Cardiovascular: Regular rate and rhythm. Lungs: Clear to auscultation bilaterally. Neck: There are no carotid bruits noted bilaterally.  NEUROLOGICAL:    01/28/2023    2:00 PM  Montreal Cognitive Assessment   Visuospatial/ Executive (0/5) 2  Naming (0/3) 2  Attention: Read list of digits (0/2) 2  Attention: Read list of letters (0/1) 0  Attention: Serial 7 subtraction starting at 100 (0/3) 0  Language: Repeat phrase (0/2) 1  Language : Fluency (0/1) 0  Abstraction (0/2) 2  Delayed Recall (0/5) 0  Orientation (0/6) 1  Total 10  Adjusted Score (based on education) 11       03/20/2022   10:30 AM 09/19/2021    2:50 PM  MMSE - Mini Mental State Exam  Orientation to time 3 3  Orientation to Place 5 5  Registration 3 3  Attention/ Calculation 0 0  Recall 2 2  Language- name 2 objects 2 2  Language- repeat 1 1  Language- follow 3 step command 3 3  Language- read & follow direction 1 1  Write a sentence 0 0  Copy design 1 1  Total score 21 21     Orientation:  Alert and oriented to person, place and time. No aphasia or dysarthria. Fund of knowledge is reduced. Recent and remote memory impaired   Attention and concentration are reduced.  Able to name objects and repeat phrases. Delayed recall 0/5 Cranial nerves: There is good facial symmetry. Extraocular muscles are intact and visual fields are full to confrontational testing. Speech is fluent and clear. no tongue deviation. Hearing is intact to conversational tone.  Tone: Tone is good throughout. Sensation: Sensation is intact to light touch and pinprick throughout. Vibration is intact at the bilateral big toe.There is no extinction with double simultaneous stimulation. There is no sensory dermatomal level  identified. Coordination: The patient has some difficulty with RAM's or FNF bilaterally. Normal finger to nose  Motor: Strength is 5/5 in the bilateral upper and lower extremities. There is no pronator drift. There are no fasciculations noted. DTR's: Deep tendon reflexes are 2/4 at the bilateral biceps, triceps, brachioradialis, patella and achilles.  Plantar responses are downgoing bilaterally. Gait and Station: The patient is  able to ambulate with difficulty, needs assistance.The patient is unable to ambulate in a tandem fashion she needs a wheelchair after a few steps     Thank you for allowing [place] the opportunity to participate in the care of this nice patient. Please do not hesitate to contact us for any questions or concerns.   Total time spent on today's visit was 41 minutes dedicated to this patient today, preparing to see patient, examining the patient, ordering tests and/or medications and counseling the patient, documenting clinical information in the EHR or other health record, independently interpreting results and communicating results to the patient/family, discussing treatment and goals, answering patient's questions and coordinating care.  Cc:  [name], NP  Sharene Butters 01/28/2023 2:54 PM

## 2023-02-12 ENCOUNTER — Ambulatory Visit: Payer: Medicare Other | Admitting: Cardiovascular Disease

## 2023-03-01 ENCOUNTER — Other Ambulatory Visit: Payer: Self-pay | Admitting: Cardiovascular Disease

## 2023-03-12 ENCOUNTER — Telehealth: Payer: Self-pay | Admitting: Nurse Practitioner

## 2023-03-12 IMAGING — DX DG CHEST 2V
2 series · 2 of 2 positions shown · non-contrast
Comparison: X-ray chest 04/03/2020.

CLINICAL DATA: Cough and malaise.

EXAM:
CHEST - 2 VIEW

[chest pa]
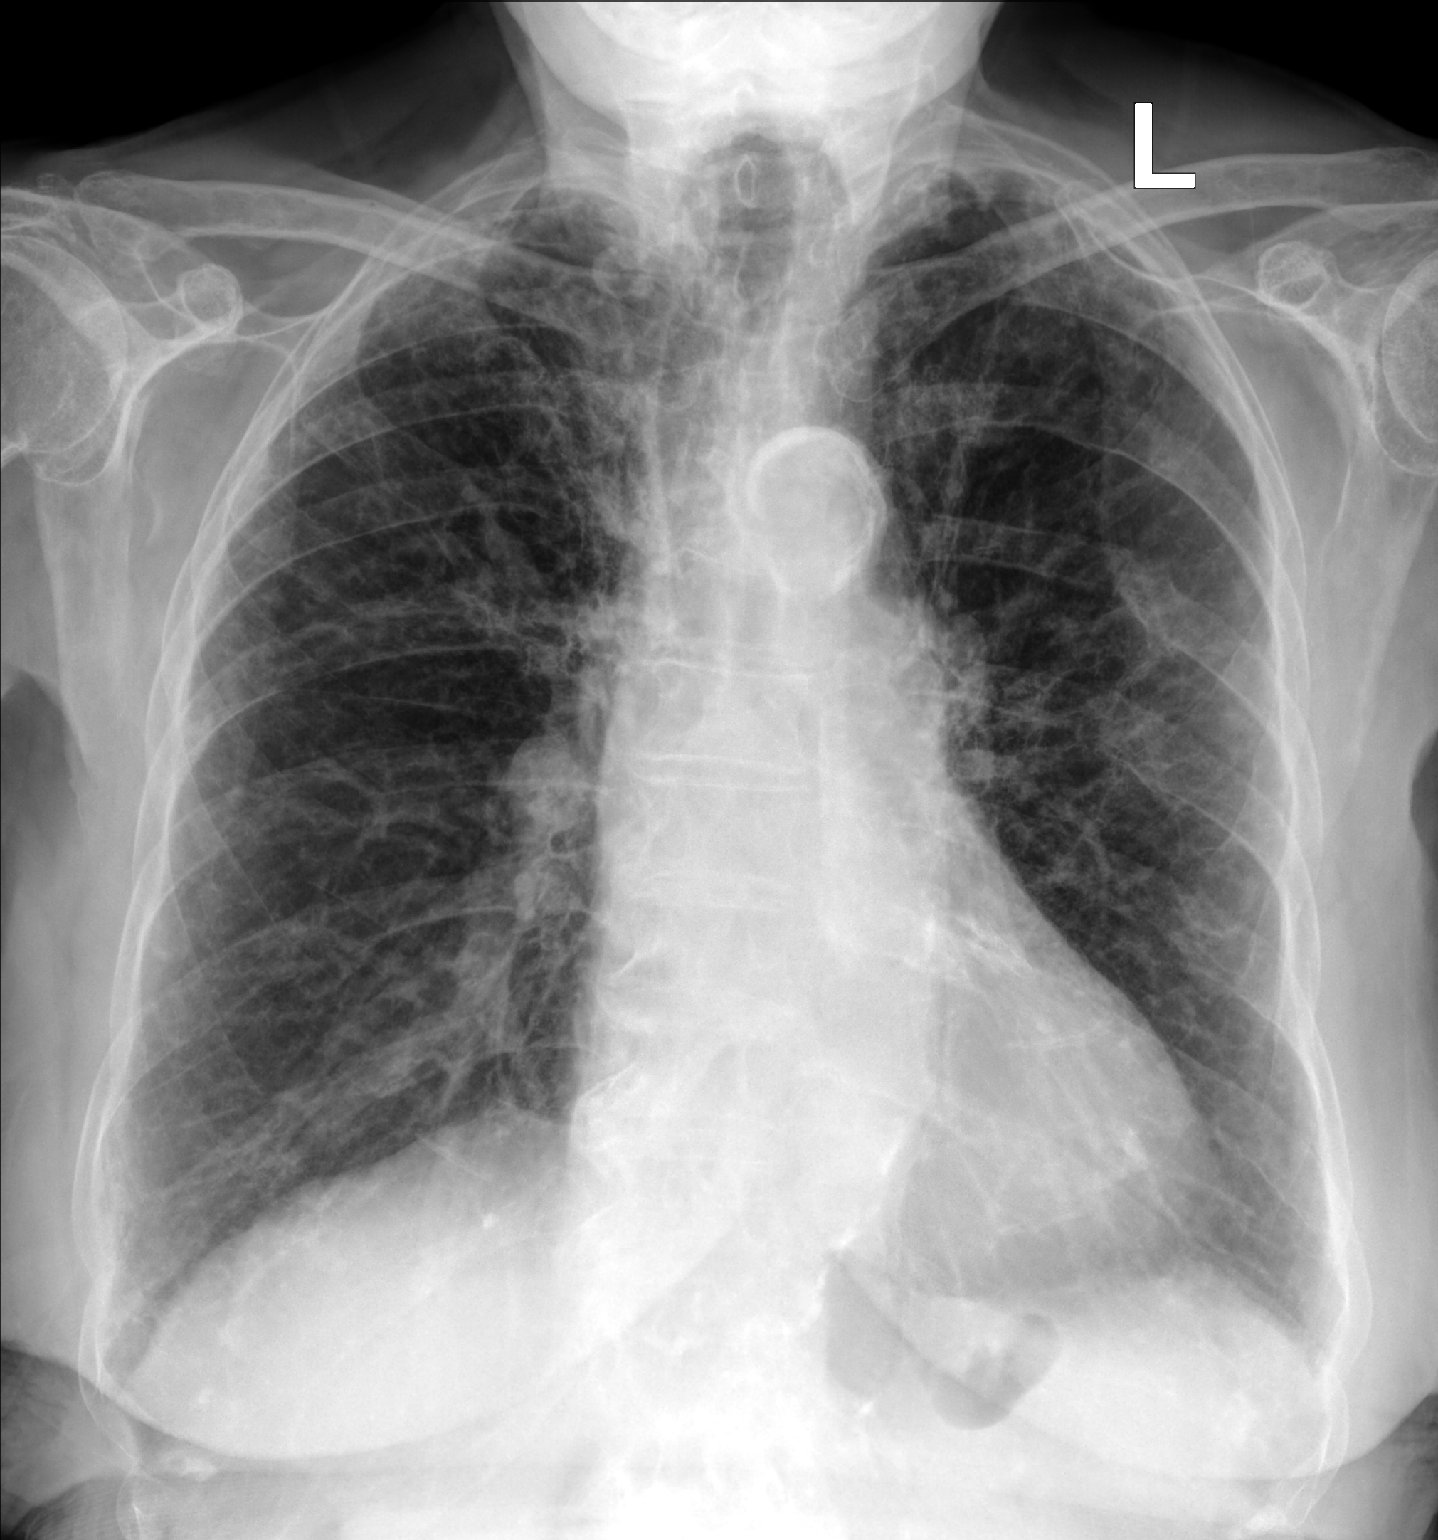

[chest lat]
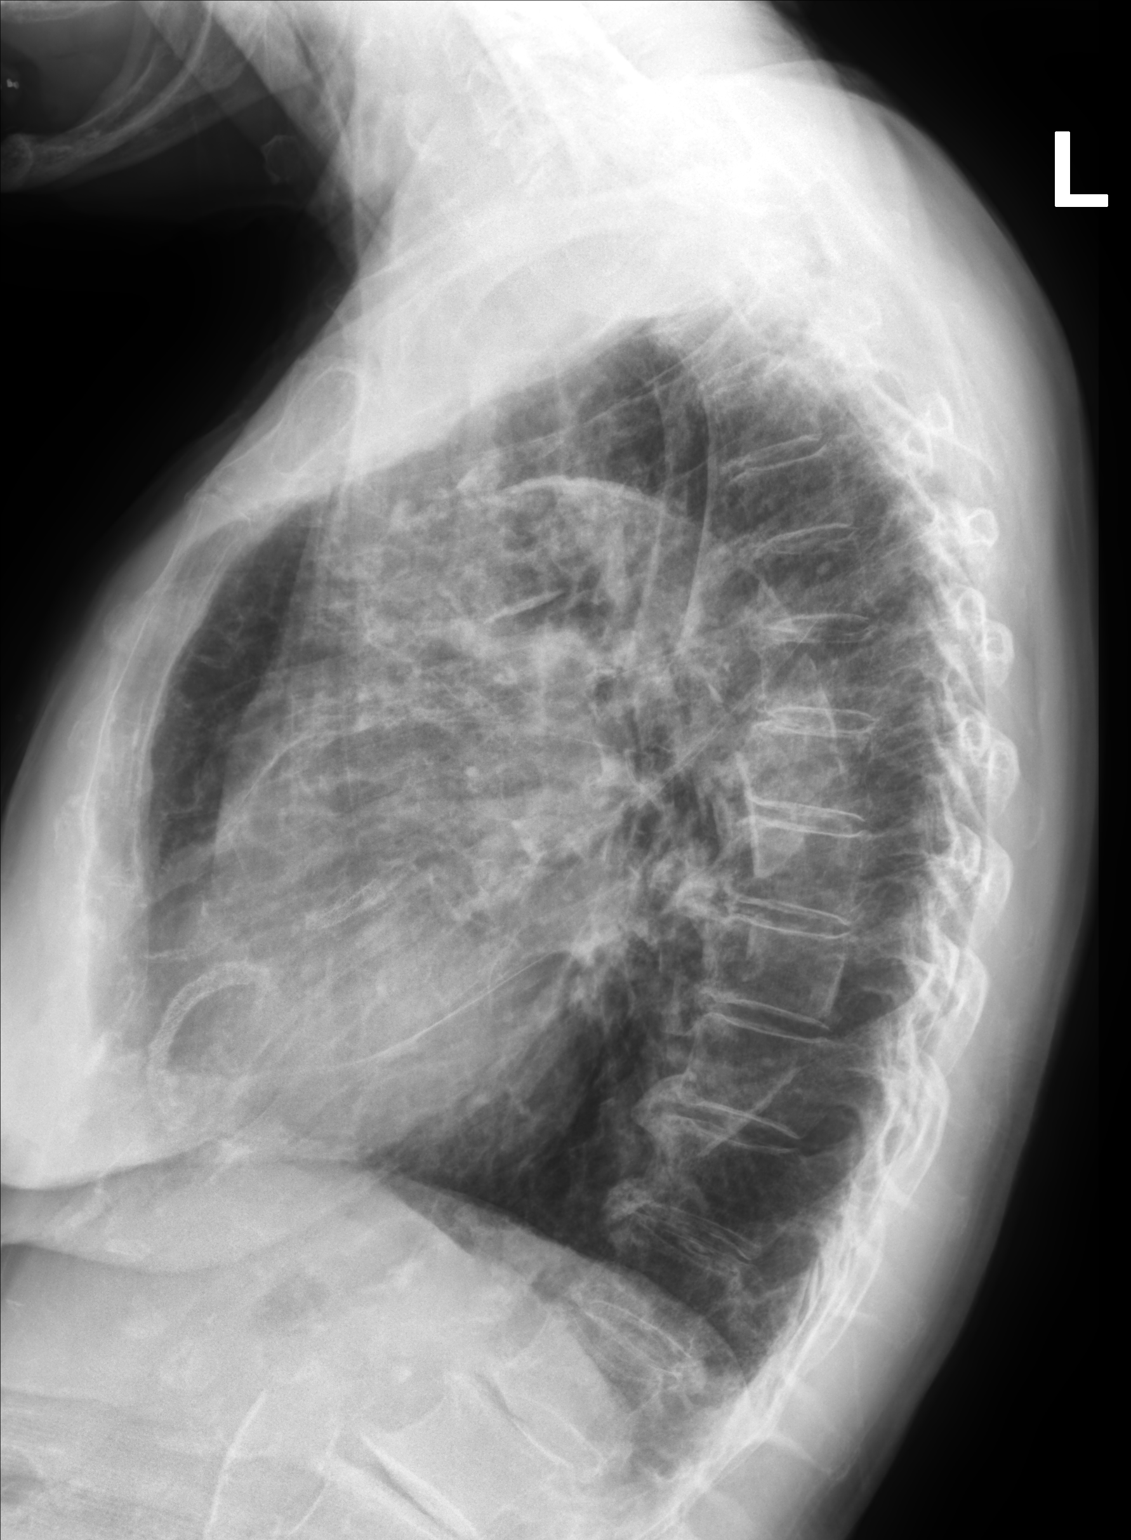

[2 of 2 positions shown; findings below may reference images not displayed]

FINDINGS: The cardiomediastinal silhouette is unchanged with normal heart
size. Aortic atherosclerosis is noted. Chronic interstitial
coarsening and biapical scarring are again noted. The left mid lung
opacity on the prior study has decreased, with minimal residual
density in this area appearing similar to radiographs from
01/03/2020 and potentially reflecting scarring. No acute airspace
consolidation, overt pulmonary edema, pleural effusion, or
pneumothorax is identified. Old left rib fractures are noted.
IMPRESSION: Chronic lung changes without evidence of acute cardiopulmonary
process.

## 2023-03-12 NOTE — Telephone Encounter (Signed)
Called patient to schedule Medicare Annual Wellness Visit (AWV). Left message for patient to call back and schedule Medicare Annual Wellness Visit (AWV).  Last date of AWV: 03/20/22  Please schedule an appointment at any time with [facility].  If any questions, please contact me at [phone].  Thank you ,  Enola Siebers [facility] AWV direct phone # [phone] 

## 2023-03-30 ENCOUNTER — Encounter: Payer: Self-pay | Admitting: Cardiovascular Disease
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
## 2023-03-31 ENCOUNTER — Ambulatory Visit: Payer: Medicare Other | Attending: Cardiovascular Disease | Admitting: Cardiovascular Disease

## 2023-03-31 ENCOUNTER — Encounter: Payer: Self-pay | Admitting: Cardiovascular Disease

## 2023-03-31 VITALS — BP 100/57 | HR 65 | Ht <= 58 in | Wt 121.0 lb

## 2023-03-31 DIAGNOSIS — I5032 Chronic diastolic (congestive) heart failure: Secondary | ICD-10-CM | POA: Diagnosis not present

## 2023-03-31 DIAGNOSIS — E782 Mixed hyperlipidemia: Secondary | ICD-10-CM | POA: Diagnosis not present

## 2023-03-31 DIAGNOSIS — I447 Left bundle-branch block, unspecified: Secondary | ICD-10-CM | POA: Diagnosis not present

## 2023-03-31 NOTE — Patient Instructions (Signed)
Medication Instructions:  Your physician recommends that you continue on your current medications as directed. Please refer to the Current Medication list given to you today.  *If you need a refill on your cardiac medications before your next appointment, please call your pharmacy*   Lab Work: NONE If you have labs (blood work) drawn today and your tests are completely normal, you will receive your results only by: MyChart Message (if you have MyChart) OR A paper copy in the mail If you have any lab test that is abnormal or we need to change your treatment, we will call you to review the results.   Testing/Procedures: NONE   Follow-Up: At [facility], you and your health needs are our priority.  As part of our continuing mission to provide you with exceptional heart care, we have created designated Provider Care Teams.  These Care Teams include your primary Cardiologist (physician) and Advanced Practice Providers (APPs -  Physician Assistants and Nurse Practitioners) who all work together to provide you with the care you need, when you need it.  We recommend signing up for the patient portal called "MyChart".  Sign up information is provided on this After Visit Summary.  MyChart is used to connect with patients for Virtual Visits (Telemedicine).  Patients are able to view lab/test results, encounter notes, upcoming appointments, etc.  Non-urgent messages can be sent to your provider as well.   To learn more about what you can do with MyChart, go to [email or web address].    Your next appointment:   6 month(s)  Provider:   Philip Nahser, MD      

## 2023-04-01 ENCOUNTER — Telehealth: Payer: Self-pay | Admitting: Cardiovascular Disease

## 2023-04-01 NOTE — Telephone Encounter (Signed)
[name] Fulton Mole called to give the name of two medications that needed to be clarified if patient was on.  Daughter states patient is not on Eliquis, she is not taking it.  Patient is on Amiodarone 200mg  tablets, she is cutting the tablet in 1/2, so she is on 100mg  daily.

## 2023-04-01 NOTE — Telephone Encounter (Signed)
Pt had a office visit on 5/8, during the visit Dr. Melburn Popper needed clarification if patient is still taking amiodarone. Pt daughter called stating pt currently is prescribed  amiodarone 200 mg however, pt cut does cut the medication in half, she is taking 100 mg of amiodarone daily. Pt is currently not taking Eliquis. Will forward to MD and nurse.

## 2023-04-05 MED ORDER — AMIODARONE HCL 200 MG PO TABS: 100.0000 mg | ORAL_TABLET | Freq: Every day | ORAL | 0 refills | Status: DC

## 2023-04-05 NOTE — Telephone Encounter (Signed)
Per OV note on 03/31/23:  6.  Atrial fib:    She has been on amio in the past .  Daughter is not sure if she is still on it or now. She will compared her list to what she has at home and will call us back for discrepancies   Updated med list to reflect patient-reported dosing of Amiodarone and routing to MD as an FYI.

## 2023-05-10 ENCOUNTER — Other Ambulatory Visit: Payer: Self-pay | Admitting: Cardiovascular Disease

## 2023-05-20 ENCOUNTER — Other Ambulatory Visit: Payer: Self-pay | Admitting: Cardiovascular Disease

## 2023-05-20 DIAGNOSIS — E860 Dehydration: Secondary | ICD-10-CM

## 2023-06-23 ENCOUNTER — Encounter (INDEPENDENT_AMBULATORY_CARE_PROVIDER_SITE_OTHER): Payer: Self-pay

## 2023-07-12 ENCOUNTER — Encounter: Payer: Self-pay | Admitting: Nurse Practitioner

## 2023-07-12 ENCOUNTER — Ambulatory Visit: Payer: Medicare Other | Admitting: Nurse Practitioner

## 2023-07-12 VITALS — BP 130/68 | HR 57 | Temp 97.2°F | Resp 16 | Ht <= 58 in | Wt 125.0 lb

## 2023-07-12 DIAGNOSIS — R413 Other amnesia: Secondary | ICD-10-CM

## 2023-07-12 DIAGNOSIS — I10 Essential (primary) hypertension: Secondary | ICD-10-CM

## 2023-07-12 DIAGNOSIS — N1831 Chronic kidney disease, stage 3a: Secondary | ICD-10-CM

## 2023-07-12 DIAGNOSIS — R7303 Prediabetes: Secondary | ICD-10-CM

## 2023-07-12 DIAGNOSIS — S02401A Maxillary fracture, unspecified, initial encounter for closed fracture: Secondary | ICD-10-CM

## 2023-07-12 DIAGNOSIS — E7800 Pure hypercholesterolemia, unspecified: Secondary | ICD-10-CM | POA: Diagnosis not present

## 2023-07-12 NOTE — Patient Instructions (Signed)
Maintain current medications Go to lab [facility] to take calcium 600mg  and vit D 1000IU daily with food

## 2023-07-12 NOTE — Progress Notes (Unsigned)
Established Patient Visit  Patient: [name]   DOB: 09-25-1928   89 y.o. Female  MRN: [id number] Visit Date: 07/13/2023  Subjective:    Chief Complaint  Patient presents with   Hypertension    Daughter requesting Vitamin D and Magnesium levels be checked    Hyperlipidemia    Not fasting    HPI Hyperlipidemia Current use of zetia and repatha. Under the care of cardiology. Repeat lipid panel  Essential hypertension BP at goal with spironolactone and imdur. Denies any syncope BP Readings from Last 3 Encounters:  07/12/23 130/68  03/31/23 (!) 100/57  01/28/23 (!) 108/56   Maintain me doses Repeat CMP  Prediabetes Repeat hgbA1c Advised Mrs. Chopra and daughter about need for low carb/low sugar diet  Memory impairment Persistent short term memory loss noted daughter. Mrs. Hallinan also acknowledges difficulty with her memory. Denies any agitation or hallucination or depression. Minicog screen 3 We denies need for additional evaluation by neurology.  They stated, they are not interested in use of any medication. Advised about importance of memory games: reading, cross word puzzles, or word finding or socializing with family. They verbalized.   Maxillary sinus fracture, closed, initial encounter (HCC) Healed No facial pain, no headache, no bruising    07/13/2023    3:19 PM 03/20/2022   10:30 AM 09/19/2021    2:50 PM  MMSE - Mini Mental State Exam  Orientation to time 1 3 3   Orientation to Place 5 5 5   Registration  3 3  Attention/ Calculation  0 0  Recall  2 2  Language- name 2 objects  2 2  Language- repeat  1 1  Language- follow 3 step command  3 3  Language- read & follow direction  1 1  Write a sentence  0 0  Copy design  1 1  Total score  21 21      Reviewed medical, surgical, and social history today  Medications: Outpatient Medications Prior to Visit  Medication Sig   acetaminophen (TYLENOL) 325 MG tablet Take 2 tablets  (650 mg total) by mouth every 6 (six) hours as needed for mild pain (or Fever >/= 101).   albuterol (VENTOLIN HFA) 108 (90 Base) MCG/ACT inhaler Inhale 1-2 puffs into the lungs every 6 (six) hours as needed for wheezing or shortness of breath.   amiodarone (PACERONE) 200 MG tablet TAKE ONE-HALF TABLET BY MOUTH  DAILY   carvedilol (COREG) 3.125 MG tablet Take 1 tablet (3.125 mg total) by mouth 2 (two) times daily with a meal.   clopidogrel (PLAVIX) 75 MG tablet TAKE 1 TABLET BY MOUTH DAILY  WITH BREAKFAST   Evolocumab (REPATHA) 140 MG/ML SOSY Inject 140 mg into the skin every 14 (fourteen) days.   ezetimibe (ZETIA) 10 MG tablet TAKE 1 TABLET BY MOUTH  DAILY   furosemide (LASIX) 20 MG tablet TAKE 1 TABLET BY MOUTH 3 TIMES  WEEKLY   isosorbide mononitrate (IMDUR) 30 MG 24 hr tablet Take 1 tablet (30 mg total) by mouth daily.   Multiple Vitamins-Minerals (PRESERVISION AREDS 2+MULTI VIT PO) Take 1 capsule by mouth in the morning and at bedtime.   nitroGLYCERIN (NITROSTAT) 0.4 MG SL tablet DISSOLVE 1 TABLET UNDER THE  TONGUE EVERY 5 MINUTES AS NEEDED FOR CHEST PAIN. MAX OF 3 TABLETS IN 15 MINUTES. CALL 911 IF PAIN  PERSISTS.   spironolactone (ALDACTONE) 25 MG tablet Take 1 tablet (25  mg total) by mouth daily.   [DISCONTINUED] traMADol-acetaminophen (ULTRACET) 37.5-325 MG tablet Take 0.5-1 tablets by mouth daily as needed for severe pain.   No facility-administered medications prior to visit.   Reviewed past medical and social history.   ROS per HPI above      Objective:  BP 130/68 (BP Location: Left Arm, Patient Position: Sitting, Cuff Size: Normal)   Pulse (!) 57   Temp (!) 97.2 F (36.2 C) (Temporal)   Resp 16   Ht 4\' 10"  (1.473 m)   Wt 125 lb (56.7 kg)   SpO2 97%   BMI 26.13 kg/m      Physical Exam HENT:     Head: Normocephalic.     Jaw: There is normal jaw occlusion.     Salivary Glands: Right salivary gland is not diffusely enlarged or tender. Left salivary gland is not  diffusely enlarged or tender.     Nose:     Right Sinus: No maxillary sinus tenderness or frontal sinus tenderness.     Left Sinus: No maxillary sinus tenderness or frontal sinus tenderness.  Eyes:     Extraocular Movements: Extraocular movements intact.     Conjunctiva/sclera: Conjunctivae normal.     Pupils: Pupils are equal, round, and reactive to light.  Cardiovascular:     Rate and Rhythm: Normal rate and regular rhythm.     Pulses: Normal pulses.     Heart sounds: Normal heart sounds.  Pulmonary:     Effort: Pulmonary effort is normal.     Breath sounds: Normal breath sounds.  Neurological:     Mental Status: She is alert and oriented to person, place, and time.  Psychiatric:        Mood and Affect: Mood normal.        Behavior: Behavior normal.        Thought Content: Thought content normal.     Results for orders placed or performed in visit on 07/12/23  Hemoglobin A1c  Result Value Ref Range   Hgb A1c MFr Bld 6.0 4.6 - 6.5 %  Comprehensive metabolic panel  Result Value Ref Range   Sodium 137 135 - 145 mEq/L   Potassium 4.4 3.5 - 5.1 mEq/L   Chloride 100 96 - 112 mEq/L   CO2 29 19 - 32 mEq/L   Glucose, Bld 98 70 - 99 mg/dL   BUN 24 (H) 6 - 23 mg/dL   Creatinine, Ser 1.61 0.40 - 1.20 mg/dL   Total Bilirubin 0.6 0.2 - 1.2 mg/dL   Alkaline Phosphatase 78 39 - 117 U/L   AST 28 0 - 37 U/L   ALT 27 0 - 35 U/L   Total Protein 7.1 6.0 - 8.3 g/dL   Albumin 4.1 3.5 - 5.2 g/dL   GFR 09.60 (L) >45.40 mL/min   Calcium 9.4 8.4 - 10.5 mg/dL  Lipid panel  Result Value Ref Range   Cholesterol 130 0 - 200 mg/dL   Triglycerides 981.1 (H) 0.0 - 149.0 mg/dL   HDL 91.47 (L) >82.95 mg/dL   VLDL 62.1 (H) 0.0 - 30.8 mg/dL   Total CHOL/HDL Ratio 3    NonHDL 92.55   LDL cholesterol, direct  Result Value Ref Range   Direct LDL 71.0 mg/dL      Assessment & Plan:    Problem List Items Addressed This Visit     Essential hypertension    BP at goal with spironolactone and  imdur. Denies any syncope BP Readings from Last 3  Encounters:  07/12/23 130/68  03/31/23 (!) 100/57  01/28/23 (!) 108/56   Maintain me doses Repeat CMP      Relevant Orders   Comprehensive metabolic panel (Completed)   Hyperlipidemia - Primary    Current use of zetia and repatha. Under the care of cardiology. Repeat lipid panel      Relevant Orders   Comprehensive metabolic panel (Completed)   Lipid panel (Completed)   LDL cholesterol, direct (Completed)   Memory impairment    Persistent short term memory loss noted daughter. Mrs. Scherger also acknowledges difficulty with her memory. Denies any agitation or hallucination or depression. Minicog screen 3 We denies need for additional evaluation by neurology.  They stated, they are not interested in use of any medication. Advised about importance of memory games: reading, cross word puzzles, or word finding or socializing with family. They verbalized.       Prediabetes    Repeat hgbA1c Advised Mrs. Woehrle and daughter about need for low carb/low sugar diet      Relevant Orders   Hemoglobin A1c (Completed)   Comprehensive metabolic panel (Completed)   Return in about 6 months (around 01/12/2024) for HTN, prediabetes, hyperlipidemia (fasting).     [name], NP

## 2023-07-13 ENCOUNTER — Encounter: Payer: Self-pay | Admitting: Nurse Practitioner

## 2023-07-13 DIAGNOSIS — S02401A Maxillary fracture, unspecified, initial encounter for closed fracture: Secondary | ICD-10-CM

## 2023-07-13 HISTORY — DX: Maxillary fracture, unspecified side, initial encounter for closed fracture: S02.401A

## 2023-07-13 LAB — COMPREHENSIVE METABOLIC PANEL
ALT: 27 U/L (ref 0–35)
AST: 28 U/L (ref 0–37)
Albumin: 4.1 g/dL (ref 3.5–5.2)
Alkaline Phosphatase: 78 U/L (ref 39–117)
BUN: 24 mg/dL — ABNORMAL HIGH (ref 6–23)
CO2: 29 meq/L (ref 19–32)
Calcium: 9.4 mg/dL (ref 8.4–10.5)
Chloride: 100 mEq/L (ref 96–112)
Creatinine, Ser: 1.03 mg/dL (ref 0.40–1.20)
GFR: 46.86 mL/min — ABNORMAL LOW (ref >60.00)
Glucose, Bld: 98 mg/dL (ref 70–99)
Potassium: 4.4 mEq/L (ref 3.5–5.1)
Sodium: 137 meq/L (ref 135–145)
Total Bilirubin: 0.6 mg/dL (ref 0.2–1.2)
Total Protein: 7.1 g/dL (ref 6.0–8.3)

## 2023-07-13 LAB — LIPID PANEL
Cholesterol: 130 mg/dL (ref 0–200)
HDL: 37.5 mg/dL — ABNORMAL LOW (ref >39.00)
NonHDL: 92.55
Total CHOL/HDL Ratio: 3
Triglycerides: 204 mg/dL — ABNORMAL HIGH (ref 0.0–149.0)
VLDL: 40.8 mg/dL — ABNORMAL HIGH (ref 0.0–40.0)

## 2023-07-13 LAB — LDL CHOLESTEROL, DIRECT: Direct LDL: 71 mg/dL

## 2023-07-13 LAB — HEMOGLOBIN A1C: Hgb A1c MFr Bld: 6 % (ref 4.6–6.5)

## 2023-07-13 NOTE — Assessment & Plan Note (Signed)
Healed No facial pain, no headache, no bruising

## 2023-07-13 NOTE — Assessment & Plan Note (Signed)
BP at goal with spironolactone and imdur. Denies any syncope BP Readings from Last 3 Encounters:  07/12/23 130/68  03/31/23 (!) 100/57  01/28/23 (!) 108/56   Maintain me doses Repeat CMP

## 2023-07-13 NOTE — Assessment & Plan Note (Signed)
Repeat hgbA1c Advised [name] and daughter about need for low carb/low sugar diet

## 2023-07-13 NOTE — Assessment & Plan Note (Signed)
Persistent short term memory loss noted daughter. [name] also acknowledges difficulty with her memory. Denies any agitation or hallucination or depression. Minicog screen 3 We denies need for additional evaluation by neurology.  They stated, they are not interested in use of any medication. Advised about importance of memory games: reading, cross word puzzles, or word finding or socializing with family. They verbalized.

## 2023-07-13 NOTE — Assessment & Plan Note (Signed)
Current use of zetia and repatha. Under the care of cardiology. Repeat lipid panel

## 2023-07-20 NOTE — Addendum Note (Signed)
Addended by: Michaela Corner on: 07/20/2023 03:38 PM   Modules accepted: Orders

## 2023-08-02 ENCOUNTER — Ambulatory Visit: Payer: Medicare Other | Admitting: Physician Assistant

## 2023-08-09 ENCOUNTER — Other Ambulatory Visit: Payer: Self-pay | Admitting: Cardiovascular Disease

## 2023-08-23 ENCOUNTER — Other Ambulatory Visit: Payer: Self-pay | Admitting: Cardiovascular Disease

## 2023-09-03 ENCOUNTER — Other Ambulatory Visit: Payer: Self-pay | Admitting: Cardiovascular Disease

## 2023-09-30 ENCOUNTER — Encounter: Payer: Self-pay | Admitting: Cardiovascular Disease
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
## 2023-10-01 ENCOUNTER — Ambulatory Visit: Payer: Medicare Other | Attending: Cardiovascular Disease | Admitting: Cardiovascular Disease

## 2023-10-04 ENCOUNTER — Encounter: Payer: Self-pay | Admitting: Cardiovascular Disease

## 2023-10-09 ENCOUNTER — Other Ambulatory Visit: Payer: Self-pay | Admitting: Nurse Practitioner

## 2023-10-11 ENCOUNTER — Other Ambulatory Visit (INDEPENDENT_AMBULATORY_CARE_PROVIDER_SITE_OTHER): Payer: Medicare Other

## 2023-10-11 DIAGNOSIS — R7303 Prediabetes: Secondary | ICD-10-CM

## 2023-10-11 DIAGNOSIS — N1831 Chronic kidney disease, stage 3a: Secondary | ICD-10-CM | POA: Diagnosis not present

## 2023-10-11 LAB — RENAL FUNCTION PANEL
Albumin: 3.9 g/dL (ref 3.5–5.2)
BUN: 18 mg/dL (ref 6–23)
CO2: 29 meq/L (ref 19–32)
Calcium: 9 mg/dL (ref 8.4–10.5)
Chloride: 99 meq/L (ref 96–112)
Creatinine, Ser: 0.96 mg/dL (ref 0.40–1.20)
GFR: 50.9 mL/min — ABNORMAL LOW (ref >60.00)
Glucose, Bld: 94 mg/dL (ref 70–99)
Phosphorus: 3.3 mg/dL (ref 2.3–4.6)
Potassium: 4.1 meq/L (ref 3.5–5.1)
Sodium: 137 meq/L (ref 135–145)

## 2023-10-11 LAB — HEMOGLOBIN A1C: Hgb A1c MFr Bld: 6.2 % (ref 4.6–6.5)

## 2023-10-19 ENCOUNTER — Other Ambulatory Visit: Payer: Self-pay | Admitting: Cardiovascular Disease

## 2023-10-26 ENCOUNTER — Other Ambulatory Visit: Payer: Self-pay | Admitting: Cardiovascular Disease

## 2023-10-26 DIAGNOSIS — E7800 Pure hypercholesterolemia, unspecified: Secondary | ICD-10-CM

## 2023-10-26 DIAGNOSIS — I25811 Atherosclerosis of native coronary artery of transplanted heart without angina pectoris: Secondary | ICD-10-CM

## 2023-10-30 ENCOUNTER — Other Ambulatory Visit: Payer: Self-pay | Admitting: Cardiovascular Disease

## 2023-11-29 ENCOUNTER — Encounter (HOSPITAL_COMMUNITY): Payer: Self-pay

## 2023-11-29 ENCOUNTER — Emergency Department (HOSPITAL_COMMUNITY): Payer: Medicare Other

## 2023-11-29 ENCOUNTER — Ambulatory Visit: Payer: Self-pay | Admitting: Nurse Practitioner

## 2023-11-29 ENCOUNTER — Inpatient Hospital Stay (HOSPITAL_COMMUNITY)
Admission: EM | Admit: 2023-11-29 | Discharge: 2023-12-02 | DRG: 071 | Disposition: A | Discharge status: Home Health | Payer: Medicare Other | Attending: Internal Medicine | Admitting: Internal Medicine

## 2023-11-29 DIAGNOSIS — I110 Hypertensive heart disease with heart failure: Secondary | ICD-10-CM | POA: Diagnosis present

## 2023-11-29 DIAGNOSIS — R413 Other amnesia: Secondary | ICD-10-CM | POA: Diagnosis present

## 2023-11-29 DIAGNOSIS — R21 Rash and other nonspecific skin eruption: Secondary | ICD-10-CM | POA: Diagnosis present

## 2023-11-29 DIAGNOSIS — J45909 Unspecified asthma, uncomplicated: Secondary | ICD-10-CM | POA: Diagnosis not present

## 2023-11-29 DIAGNOSIS — I5042 Chronic combined systolic (congestive) and diastolic (congestive) heart failure: Secondary | ICD-10-CM | POA: Diagnosis present

## 2023-11-29 DIAGNOSIS — D649 Anemia, unspecified: Secondary | ICD-10-CM | POA: Diagnosis not present

## 2023-11-29 DIAGNOSIS — I10 Essential (primary) hypertension: Secondary | ICD-10-CM | POA: Diagnosis present

## 2023-11-29 DIAGNOSIS — D696 Thrombocytopenia, unspecified: Secondary | ICD-10-CM | POA: Diagnosis present

## 2023-11-29 DIAGNOSIS — Z1152 Encounter for screening for COVID-19: Secondary | ICD-10-CM

## 2023-11-29 DIAGNOSIS — N179 Acute kidney failure, unspecified: Secondary | ICD-10-CM | POA: Diagnosis present

## 2023-11-29 DIAGNOSIS — I2510 Atherosclerotic heart disease of native coronary artery without angina pectoris: Secondary | ICD-10-CM | POA: Diagnosis not present

## 2023-11-29 DIAGNOSIS — R55 Syncope and collapse: Secondary | ICD-10-CM | POA: Diagnosis not present

## 2023-11-29 DIAGNOSIS — I480 Paroxysmal atrial fibrillation: Secondary | ICD-10-CM | POA: Diagnosis present

## 2023-11-29 DIAGNOSIS — E860 Dehydration: Principal | ICD-10-CM | POA: Diagnosis present

## 2023-11-29 DIAGNOSIS — Z79899 Other long term (current) drug therapy: Secondary | ICD-10-CM

## 2023-11-29 DIAGNOSIS — G9340 Encephalopathy, unspecified: Secondary | ICD-10-CM | POA: Diagnosis not present

## 2023-11-29 DIAGNOSIS — Z66 Do not resuscitate: Secondary | ICD-10-CM | POA: Diagnosis present

## 2023-11-29 DIAGNOSIS — Z7902 Long term (current) use of antithrombotics/antiplatelets: Secondary | ICD-10-CM

## 2023-11-29 DIAGNOSIS — I482 Chronic atrial fibrillation, unspecified: Secondary | ICD-10-CM | POA: Diagnosis present

## 2023-11-29 DIAGNOSIS — Z8679 Personal history of other diseases of the circulatory system: Secondary | ICD-10-CM

## 2023-11-29 DIAGNOSIS — W19XXXA Unspecified fall, initial encounter: Secondary | ICD-10-CM

## 2023-11-29 DIAGNOSIS — G9341 Metabolic encephalopathy: Secondary | ICD-10-CM | POA: Diagnosis not present

## 2023-11-29 DIAGNOSIS — Z8249 Family history of ischemic heart disease and other diseases of the circulatory system: Secondary | ICD-10-CM

## 2023-11-29 DIAGNOSIS — K219 Gastro-esophageal reflux disease without esophagitis: Secondary | ICD-10-CM | POA: Diagnosis present

## 2023-11-29 DIAGNOSIS — Z885 Allergy status to narcotic agent status: Secondary | ICD-10-CM

## 2023-11-29 DIAGNOSIS — Z888 Allergy status to other drugs, medicaments and biological substances status: Secondary | ICD-10-CM

## 2023-11-29 DIAGNOSIS — E876 Hypokalemia: Secondary | ICD-10-CM | POA: Diagnosis present

## 2023-11-29 DIAGNOSIS — J189 Pneumonia, unspecified organism: Secondary | ICD-10-CM | POA: Diagnosis present

## 2023-11-29 DIAGNOSIS — F02A0 Dementia in other diseases classified elsewhere, mild, without behavioral disturbance, psychotic disturbance, mood disturbance, and anxiety: Secondary | ICD-10-CM | POA: Diagnosis present

## 2023-11-29 DIAGNOSIS — I25119 Atherosclerotic heart disease of native coronary artery with unspecified angina pectoris: Secondary | ICD-10-CM | POA: Diagnosis present

## 2023-11-29 DIAGNOSIS — E785 Hyperlipidemia, unspecified: Secondary | ICD-10-CM | POA: Diagnosis present

## 2023-11-29 DIAGNOSIS — I5043 Acute on chronic combined systolic (congestive) and diastolic (congestive) heart failure: Secondary | ICD-10-CM | POA: Diagnosis present

## 2023-11-29 DIAGNOSIS — N19 Unspecified kidney failure: Secondary | ICD-10-CM

## 2023-11-29 DIAGNOSIS — R627 Adult failure to thrive: Secondary | ICD-10-CM | POA: Diagnosis present

## 2023-11-29 DIAGNOSIS — E7800 Pure hypercholesterolemia, unspecified: Secondary | ICD-10-CM

## 2023-11-29 HISTORY — DX: Encephalopathy, unspecified: G93.40

## 2023-11-29 LAB — URINALYSIS, ROUTINE W REFLEX MICROSCOPIC
Bilirubin Urine: NEGATIVE
Glucose, UA: NEGATIVE mg/dL
Ketones, ur: NEGATIVE mg/dL
Leukocytes,Ua: NEGATIVE
Nitrite: NEGATIVE
Protein, ur: 30 mg/dL — AB
Specific Gravity, Urine: 1.018 (ref 1.005–1.030)
pH: 5 (ref 5.0–8.0)

## 2023-11-29 LAB — COMPREHENSIVE METABOLIC PANEL
ALT: 19 U/L (ref 0–44)
AST: 43 U/L — ABNORMAL HIGH (ref 15–41)
Albumin: 3 g/dL — ABNORMAL LOW (ref 3.5–5.0)
Alkaline Phosphatase: 59 U/L (ref 38–126)
Anion gap: 14 (ref 5–15)
BUN: 51 mg/dL — ABNORMAL HIGH (ref 8–23)
CO2: 24 mmol/L (ref 22–32)
Calcium: 8.8 mg/dL — ABNORMAL LOW (ref 8.9–10.3)
Chloride: 97 mmol/L — ABNORMAL LOW (ref 98–111)
Creatinine, Ser: 1.82 mg/dL — ABNORMAL HIGH (ref 0.44–1.00)
GFR, Estimated: 26 mL/min — ABNORMAL LOW (ref >60)
Glucose, Bld: 120 mg/dL — ABNORMAL HIGH (ref 70–99)
Potassium: 3.8 mmol/L (ref 3.5–5.1)
Sodium: 135 mmol/L (ref 135–145)
Total Bilirubin: 1.4 mg/dL — ABNORMAL HIGH (ref 0.0–1.2)
Total Protein: 6.9 g/dL (ref 6.5–8.1)

## 2023-11-29 LAB — CBC WITH DIFFERENTIAL/PLATELET
Abs Immature Granulocytes: 0.03 10*3/uL (ref 0.00–0.07)
Basophils Absolute: 0 10*3/uL (ref 0.0–0.1)
Basophils Relative: 0 %
Eosinophils Absolute: 0 10*3/uL (ref 0.0–0.5)
Eosinophils Relative: 0 %
HCT: 41.7 % (ref 36.0–46.0)
Hemoglobin: 13.6 g/dL (ref 12.0–15.0)
Immature Granulocytes: 0 %
Lymphocytes Relative: 16 %
Lymphs Abs: 2 10*3/uL (ref 0.7–4.0)
MCH: 32.7 pg (ref 26.0–34.0)
MCHC: 32.6 g/dL (ref 30.0–36.0)
MCV: 100.2 fL — ABNORMAL HIGH (ref 80.0–100.0)
Monocytes Absolute: 0.8 10*3/uL (ref 0.1–1.0)
Monocytes Relative: 6 %
Neutro Abs: 9.7 10*3/uL — ABNORMAL HIGH (ref 1.7–7.7)
Neutrophils Relative %: 78 %
Platelets: 74 10*3/uL — ABNORMAL LOW (ref 150–400)
RBC: 4.16 MIL/uL (ref 3.87–5.11)
RDW: 14.4 % (ref 11.5–15.5)
WBC: 12.5 10*3/uL — ABNORMAL HIGH (ref 4.0–10.5)
nRBC: 0 % (ref 0.0–0.2)

## 2023-11-29 LAB — I-STAT CHEM 8, ED
BUN: 55 mg/dL — ABNORMAL HIGH (ref 8–23)
Calcium, Ion: 1.01 mmol/L — ABNORMAL LOW (ref 1.15–1.40)
Chloride: 98 mmol/L (ref 98–111)
Creatinine, Ser: 1.9 mg/dL — ABNORMAL HIGH (ref 0.44–1.00)
Glucose, Bld: 117 mg/dL — ABNORMAL HIGH (ref 70–99)
HCT: 42 % (ref 36.0–46.0)
Hemoglobin: 14.3 g/dL (ref 12.0–15.0)
Potassium: 3.8 mmol/L (ref 3.5–5.1)
Sodium: 135 mmol/L (ref 135–145)
TCO2: 27 mmol/L (ref 22–32)

## 2023-11-29 LAB — PROTIME-INR
INR: 1.1 (ref 0.8–1.2)
Prothrombin Time: 14.1 s (ref 11.4–15.2)

## 2023-11-29 LAB — CBG MONITORING, ED: Glucose-Capillary: 102 mg/dL — ABNORMAL HIGH (ref 70–99)

## 2023-11-29 LAB — APTT: aPTT: 26 s (ref 24–36)

## 2023-11-29 MED ORDER — SODIUM CHLORIDE 0.9 % IV SOLN
1.0000 g | INTRAVENOUS | Status: DC
  Administered 2023-11-30 – 2023-12-02 (×3): 1 g via INTRAVENOUS
  Filled 2023-11-29 (×3): qty 10

## 2023-11-29 MED ORDER — SODIUM CHLORIDE 0.9 % IV BOLUS
500.0000 mL | Freq: Once | INTRAVENOUS | Status: AC
  Administered 2023-11-29: 500 mL via INTRAVENOUS

## 2023-11-29 MED ORDER — EZETIMIBE 10 MG PO TABS
10.0000 mg | ORAL_TABLET | Freq: Every day | ORAL | Status: DC
  Administered 2023-11-30 – 2023-12-02 (×3): 10 mg via ORAL
  Filled 2023-11-29 (×3): qty 1

## 2023-11-29 MED ORDER — CLOPIDOGREL BISULFATE 75 MG PO TABS
75.0000 mg | ORAL_TABLET | Freq: Every day | ORAL | Status: DC
  Administered 2023-11-30 – 2023-12-02 (×3): 75 mg via ORAL
  Filled 2023-11-29 (×3): qty 1

## 2023-11-29 MED ORDER — POLYETHYLENE GLYCOL 3350 17 G PO PACK: 17.0000 g | PACK | Freq: Every day | ORAL | Status: DC | PRN

## 2023-11-29 MED ORDER — SODIUM CHLORIDE 0.9 % IV SOLN: INTRAVENOUS | Status: AC

## 2023-11-29 MED ORDER — ACETAMINOPHEN 325 MG PO TABS
650.0000 mg | ORAL_TABLET | Freq: Four times a day (QID) | ORAL | Status: DC | PRN
  Administered 2023-11-30 – 2023-12-01 (×3): 650 mg via ORAL
  Filled 2023-11-29 (×3): qty 2

## 2023-11-29 MED ORDER — ALBUTEROL SULFATE (2.5 MG/3ML) 0.083% IN NEBU: 3.0000 mL | INHALATION_SOLUTION | Freq: Four times a day (QID) | RESPIRATORY_TRACT | Status: DC | PRN

## 2023-11-29 MED ORDER — SODIUM CHLORIDE 0.9 % IV SOLN
1.0000 g | Freq: Once | INTRAVENOUS | Status: AC
  Administered 2023-11-29: 1 g via INTRAVENOUS
  Filled 2023-11-29: qty 10

## 2023-11-29 MED ORDER — ACETAMINOPHEN 650 MG RE SUPP: 650.0000 mg | Freq: Four times a day (QID) | RECTAL | Status: DC | PRN

## 2023-11-29 MED ORDER — SODIUM CHLORIDE 0.9% FLUSH
3.0000 mL | Freq: Two times a day (BID) | INTRAVENOUS | Status: DC
  Administered 2023-11-29 – 2023-12-02 (×6): 3 mL via INTRAVENOUS

## 2023-11-29 MED ORDER — AMIODARONE HCL 200 MG PO TABS
100.0000 mg | ORAL_TABLET | Freq: Every day | ORAL | Status: DC
  Administered 2023-11-30 – 2023-12-02 (×3): 100 mg via ORAL
  Filled 2023-11-29 (×3): qty 1

## 2023-11-29 NOTE — Telephone Encounter (Signed)
 Copied from CRM [id number]. Topic: Clinical - Red Word Triage >> Nov 29, 2023 11:44 AM Gerardine PARAS wrote: Red Word that prompted transfer to Nurse Triage: Patient representative calling in regarding a episode patient has 2 days ago, patient almost passed out but did catch herself. She now has less strength on left side of body, jolting as if she is having spasms and blood pressure is 95 over 55. Patient is not very sure on who anyone is, barely opening eyes or even completing full sentence. Immediate family will not take patient to hospital and patient representative states it may have been a stroke, interested in a possible CT scan and further medical advice   Chief Complaint: Neurological complaints Symptoms: hypotension, left sided weakness--last known normal 1.5 days ago, dizziness, trouble walking, change in responsiveness, inability to recognize family members. Frequency: x 2 days Pertinent Negatives: Patient denies unknown Disposition: [x] ED /[] Urgent Care (no appt availability in office) / [] Appointment(In office/virtual)/ []  [place] [facility]/ [] Home Care/ [] Refused Recommended Disposition /[] [facility]/ []  Follow-up with PCP Additional Notes: Patient is having left sided deficits after. Great granddaughter called for the patient and patient/patient's two daughters Ronal Dragon and [place]. Family was on speaker phone so everyone could be involved in this conversation.  [name] states that the day before yesterday patient was using a walker and she started top fall and almost fall--she called for help and she was assisted down to the floor.  She appeared fine at that time per family.  [name] states that later on that day she stopped responding appropriately, jerky motion every so often, and just wanted to sleep. Family states patient has eaten a little bit and her blood pressure has been low  at 11:30am it was 95/55.  Patient told her family that she was dizzy when she was helped off the  floor two days ago.  With this 11:30am blood pressure patient told her great granddaughter she was dizzy.  Patient did not recognize family members that she would normally recognize.  Patient's two daughters were hesitant about the patient going to the hospital because of all other common viruses/colds that she could possibly catch.  The patient's last know normal was a day and a half ago when she had that near syncope episode and was assisted to the floor but with the continual decline in mental status and the great granddaughter expressed that the patient felt dizzy with this blood pressure being taken at 11:30 am (30 minutes prior to triage with this RN) and the change in responsiveness/behavior and not recognizing people she usually recognizes, this RN advised them that calling 911 and getting this patient to the emergency room is going to be the recommendation at this time.  Patient has had a change in mental status following this brief near syncope episode with left sided weakness apparent now.  Family was concerned for a stroke and they were advised that she needs to be evaluated at this time by a provider at the emergency room.  Family verbalized understanding of the situation and that she needed to go to the emergency room at this time.They state that they are going to call 911 as soon as we hang up to get an ambulance on the way to take the patient.  This RN asked if they were sure that they wanted to call themselves and the granddaughter verbalized that they were going to call 911 as soon as we hung up.  Reason for Disposition  Sounds like a life-threatening emergency to the triager  Answer Assessment - Initial Assessment Questions 1. SYMPTOM: What is the main symptom you are concerned about? (e.g., weakness, numbness)     Left sided weakness and if she had a stroke or not 2. ONSET: When did this start? (minutes, hours, days; while sleeping)     Two days ago 3. LAST NORMAL: When  was the last time you (the patient) were normal (no symptoms)?     1.5 days ago in the morning 4. PATTERN Does this come and go, or has it been constant since it started?  Is it present now?     Patient's condition 5. CARDIAC SYMPTOMS: Have you had any of the following symptoms: chest pain, difficulty breathing, palpitations?     Family denies and patient denies 6. NEUROLOGIC SYMPTOMS: Have you had any of the following symptoms: headache, dizziness, vision loss, double vision, changes in speech, unsteady on your feet?     Dizziness 7. OTHER SYMPTOMS: Do you have any other symptoms?     Wanting to sleep  Protocols used: Neurologic Deficit-A-AH

## 2023-11-29 NOTE — ED Notes (Signed)
 Granddaughter also added that at baseline, pt is able to walk to the bathroom using a walker. Since Saturday, pt has been unable to walk due to weakness and has been incontinent of urine. UA sent to lab.
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
## 2023-11-29 NOTE — ED Provider Notes (Signed)
 [place] EMERGENCY DEPARTMENT AT [facility] Provider Note   CSN: [id number] Arrival date & time: 11/29/23  1331     History  Chief Complaint  Patient presents with   Failure To Thrive    Derinda Bartus Guilliams is a 89 y.o. female.  Patient presents due to persistent confusion, general weakness, decreased appetite since passing out 2 days prior and family members caught patient and assisted her to the floor.  Patient has not been seen since.  Granddaughter reports patient normally is conversant alert oriented x 4 and uses a walker at baseline.  Patient now cannot walk is very weak and poor appetite.  Reports that she did not hit her head however granddaughter was not there to witness.  History of mild dementia.  Decreased urine output.  The history is provided by a relative.       Home Medications Prior to Admission medications   Medication Sig Start Date End Date Taking? Authorizing Provider  acetaminophen  (TYLENOL ) 325 MG tablet Take 2 tablets (650 mg total) by mouth every 6 (six) hours as needed for mild pain (or Fever >/= 101). 09/02/22  Yes Samtani, Jai-Gurmukh, MD  albuterol  (VENTOLIN  HFA) 108 (90 Base) MCG/ACT inhaler Inhale 1-2 puffs into the lungs every 6 (six) hours as needed for wheezing or shortness of breath. 01/24/21  Yes Nche, Roselie Rockford, NP  amiodarone  (PACERONE ) 200 MG tablet TAKE ONE-HALF TABLET BY MOUTH  DAILY 05/21/23  Yes Nahser, Aleene PARAS, MD  carvedilol  (COREG ) 3.125 MG tablet TAKE 1 TABLET BY MOUTH TWICE  DAILY WITH A MEAL 10/20/23  Yes Nahser, Aleene PARAS, MD  clopidogrel  (PLAVIX ) 75 MG tablet TAKE 1 TABLET BY MOUTH DAILY  WITH BREAKFAST 10/11/23  Yes Nche, Roselie Rockford, NP  Evolocumab  (REPATHA  SURECLICK) 140 MG/ML SOAJ INJECT 140MG  SUBCUTANEOUSLY  EVERY 2 WEEKS 10/27/23  Yes Nahser, Aleene PARAS, MD  ezetimibe  (ZETIA ) 10 MG tablet TAKE 1 TABLET BY MOUTH DAILY 08/10/23  Yes Nahser, Aleene PARAS, MD  furosemide  (LASIX ) 20 MG tablet TAKE 1 TABLET BY MOUTH 3 TIMES   WEEKLY Patient taking differently: Take 20 mg by mouth See admin instructions. Take 1 tablet (20mg ) every Monday, Wednesday, Friday. May take 1 tablet daily as needed for swelling on the other days. 05/21/23  Yes Nahser, Aleene PARAS, MD  isosorbide  mononitrate (IMDUR ) 30 MG 24 hr tablet TAKE 1 TABLET BY MOUTH DAILY 11/03/23  Yes Nahser, Aleene PARAS, MD  Multiple Vitamins-Minerals (PRESERVISION AREDS 2) CAPS Take 1 capsule by mouth 2 (two) times daily.   Yes [provider]  nitroGLYCERIN  (NITROSTAT ) 0.4 MG SL tablet DISSOLVE 1 TABLET UNDER THE  TONGUE EVERY 5 MINUTES AS NEEDED FOR CHEST PAIN. MAX OF 3 TABLETS IN 15 MINUTES. CALL 911 IF PAIN  PERSISTS. 05/11/23  Yes Nahser, Aleene PARAS, MD  spironolactone  (ALDACTONE ) 25 MG tablet TAKE 1 TABLET BY MOUTH DAILY 11/03/23  Yes Nahser, Aleene PARAS, MD      Allergies    Codeine, Crestor  [rosuvastatin  calcium ], Entresto  [sacubitril -valsartan ], Pravachol [pravastatin], Protonix [pantoprazole sodium], Zestril [lisinopril], and Zocor [simvastatin]    Review of Systems   Review of Systems  Unable to perform ROS: Mental status change    Physical Exam Updated Vital Signs BP (!) 109/54 (BP Location: Right Arm)   Pulse 63   Temp 98.3 F (36.8 C) (Oral)   Resp 15   Ht 4' 10 (1.473 m)   Wt 56.7 kg   SpO2 95%   BMI 26.13 kg/m  Physical Exam  Vitals and nursing note reviewed.  Constitutional:      General: She is not in acute distress.    Appearance: She is well-developed.  HENT:     Head: Normocephalic and atraumatic.     Mouth/Throat:     Mouth: Mucous membranes are dry.  Eyes:     General:        Right eye: No discharge.        Left eye: No discharge.     Conjunctiva/sclera: Conjunctivae normal.  Neck:     Trachea: No tracheal deviation.  Cardiovascular:     Rate and Rhythm: Normal rate and regular rhythm.  Pulmonary:     Effort: Pulmonary effort is normal.     Breath sounds: Normal breath sounds.  Abdominal:     General: There is no  distension.     Palpations: Abdomen is soft.     Tenderness: There is no abdominal tenderness. There is no guarding.  Musculoskeletal:        General: No swelling.     Cervical back: Normal range of motion and neck supple. No rigidity.  Skin:    General: Skin is warm.     Capillary Refill: Capillary refill takes less than 2 seconds.     Findings: No rash.  Neurological:     General: No focal deficit present.     Mental Status: She is alert.     Cranial Nerves: No cranial nerve deficit.  Psychiatric:     Comments: Pleasant dementia, general weakness     ED Results / Procedures / Treatments   Labs (all labs ordered are listed, but only abnormal results are displayed) Labs Reviewed  CBC WITH DIFFERENTIAL/PLATELET - Abnormal; Notable for the following components:      Result Value   WBC 12.5 (*)    MCV 100.2 (*)    Platelets 74 (*)    Neutro Abs 9.7 (*)    All other components within normal limits  COMPREHENSIVE METABOLIC PANEL - Abnormal; Notable for the following components:   Chloride 97 (*)    Glucose, Bld 120 (*)    BUN 51 (*)    Creatinine, Ser 1.82 (*)    Calcium  8.8 (*)    Albumin 3.0 (*)    AST 43 (*)    Total Bilirubin 1.4 (*)    GFR, Estimated 26 (*)    All other components within normal limits  URINALYSIS, ROUTINE W REFLEX MICROSCOPIC - Abnormal; Notable for the following components:   Color, Urine AMBER (*)    APPearance HAZY (*)    Hgb urine dipstick SMALL (*)    Protein, ur 30 (*)    Bacteria, UA MANY (*)    All other components within normal limits  CBG MONITORING, ED - Abnormal; Notable for the following components:   Glucose-Capillary 102 (*)    All other components within normal limits  I-STAT CHEM 8, ED - Abnormal; Notable for the following components:   BUN 55 (*)    Creatinine, Ser 1.90 (*)    Glucose, Bld 117 (*)    Calcium , Ion 1.01 (*)    All other components within normal limits  URINE CULTURE  APTT  PROTIME-INR    EKG EKG  Interpretation Date/Time:  Monday November 29 2023 14:35:27 EST Ventricular Rate:  66 PR Interval:  224 QRS Duration:  134 QT Interval:  494 QTC Calculation: 517 R Axis:   215  Text Interpretation: Sinus rhythm with 1st degree A-V block Right  superior axis deviation Non-specific intra-ventricular conduction block Minimal voltage criteria for LVH, may be normal variant ( Cornell product ) Cannot rule out Septal infarct , age undetermined T wave abnormality, consider lateral ischemia Abnormal ECG When compared with ECG of 30-Nov-2022 18:53, PREVIOUS ECG IS PRESENT Confirmed by Tonia Chew [phone]) on 11/29/2023 4:28:27 PM  Radiology CT Head Wo Contrast Result Date: 11/29/2023 CLINICAL DATA:  Mental status change EXAM: CT HEAD WITHOUT CONTRAST TECHNIQUE: Contiguous axial images were obtained from the base of the skull through the vertex without intravenous contrast. RADIATION DOSE REDUCTION: This exam was performed according to the departmental dose-optimization program which includes automated exposure control, adjustment of the mA and/or kV according to patient size and/or use of iterative reconstruction technique. COMPARISON:  Head CT 12/11/2022.  MRI brain 09/08/2022. FINDINGS: Brain: No evidence of acute infarction, hemorrhage, hydrocephalus, extra-axial collection or mass lesion/mass effect. There is moderate diffuse atrophy. There is mild periventricular white matter hypodensity, likely chronic small vessel ischemic change. This is similar to prior study. Vascular: Atherosclerotic calcifications are present within the cavernous internal carotid arteries. There also atherosclerotic calcifications of the intracranial vertebral arteries. Skull: Normal. Negative for fracture or focal lesion. Sinuses/Orbits: No acute finding. Other: None. IMPRESSION: 1. No acute intracranial process. 2. Moderate diffuse atrophy and mild chronic small vessel ischemic change. Electronically Signed   By: Greig Pique M.D.    On: 11/29/2023 17:58   DG Chest 2 View Result Date: 11/29/2023 CLINICAL DATA:  Failure to thrive EXAM: CHEST - 2 VIEW COMPARISON:  11/30/2022, 10/25/2022, chest CT 10/25/2022 FINDINGS: Underlying chronic lung disease with mild fibrosis. Patchy right mid and upper lung slightly nodular opacities. Normal cardiac size. Aortic atherosclerosis. IMPRESSION: Underlying chronic lung disease with mild fibrosis. Patchy right mid and upper lung slightly nodular opacities, favor infectious or inflammatory, continued radiographic follow-up to resolution is recommended. Electronically Signed   By: Luke Bun M.D.   On: 11/29/2023 15:48    Procedures Procedures    Medications Ordered in ED Medications  sodium chloride  0.9 % bolus 500 mL (0 mLs Intravenous Stopped 11/29/23 1730)  cefTRIAXone  (ROCEPHIN ) 1 g in sodium chloride  0.9 % 100 mL IVPB (0 g Intravenous Stopped 11/29/23 1730)    ED Course/ Medical Decision Making/ A&P                                 Medical Decision Making Amount and/or Complexity of Data Reviewed Labs: ordered. Radiology: ordered.  Risk Decision regarding hospitalization.   Patient presents with altered mental status for the past 2 days and general weakness.  Patient is also had a fall prior to this.  Differential broad including metabolic, UTI, renal failure, dehydration, intracranial bleeding, stroke, other.  Blood work independently reviewed concern for acute renal failure/uremia with BUN 51, Cr elevated 1.2 normally around 1.  White blood cell count mild elevated 12.1 concern for infectious etiology given chest x-ray possible pneumonia and UTI with many white blood cells, urine culture sent for completeness.  Patient generally weak on exam no focal findings no concern for significant stroke at this time.  CT head results independently reviewed no bleeding or large stroke.  Patient follows with [place], hospitalist paged for admission, IV fluid bolus 500 cc given due to  borderline blood pressure, minimal oral intake the past 2 days and significant elevated BUN.  Chest x-ray no signs of pulmonary edema.  Medical records reviewed patient has history  of 35% EF on last echo.        Final Clinical Impression(s) / ED Diagnoses Final diagnoses:  Dehydration  Acute encephalopathy  Uremia  Fall, initial encounter  Syncope and collapse  History of CHF (congestive heart failure)    Rx / DC Orders ED Discharge Orders     None      ,.   Tonia Chew, MD 11/29/23 1828
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
## 2023-11-29 NOTE — Telephone Encounter (Signed)
 Patient is currently in the hospital to be checked out now. Dm/cma

## 2023-11-29 NOTE — ED Triage Notes (Signed)
 Pt has not been eating and drinking since Saturday. Baseline walks with a walker. Hx of dementia. Also has decreased urine output. Able to answer some questions.

## 2023-11-29 NOTE — ED Notes (Signed)
 Patient transported to X-ray

## 2023-11-29 NOTE — ED Notes (Signed)
 Pt granddaughter came in and states pt had a syncopal episode on Saturday. No falls noted. Pt was walking to the bathroom and passed out. Family members caught pt and was assisted to the floor. Since then, pt has been weak, more confused than baseline and poor appetite. Family is worried about a stroke. Granddaughter reports pt has been leaning to the right side but has left sided weakness. [employer] was 1 since arrival to ED. Will notify MD.

## 2023-11-30 ENCOUNTER — Encounter (HOSPITAL_COMMUNITY): Payer: Self-pay | Admitting: Internal Medicine

## 2023-11-30 ENCOUNTER — Other Ambulatory Visit: Payer: Self-pay

## 2023-11-30 DIAGNOSIS — Z66 Do not resuscitate: Secondary | ICD-10-CM | POA: Diagnosis present

## 2023-11-30 DIAGNOSIS — R627 Adult failure to thrive: Secondary | ICD-10-CM | POA: Diagnosis present

## 2023-11-30 DIAGNOSIS — N179 Acute kidney failure, unspecified: Secondary | ICD-10-CM | POA: Diagnosis present

## 2023-11-30 DIAGNOSIS — R55 Syncope and collapse: Secondary | ICD-10-CM | POA: Diagnosis present

## 2023-11-30 DIAGNOSIS — E876 Hypokalemia: Secondary | ICD-10-CM | POA: Diagnosis present

## 2023-11-30 DIAGNOSIS — E785 Hyperlipidemia, unspecified: Secondary | ICD-10-CM | POA: Diagnosis present

## 2023-11-30 DIAGNOSIS — R21 Rash and other nonspecific skin eruption: Secondary | ICD-10-CM | POA: Diagnosis present

## 2023-11-30 DIAGNOSIS — I25119 Atherosclerotic heart disease of native coronary artery with unspecified angina pectoris: Secondary | ICD-10-CM | POA: Diagnosis present

## 2023-11-30 DIAGNOSIS — G9341 Metabolic encephalopathy: Secondary | ICD-10-CM | POA: Diagnosis present

## 2023-11-30 DIAGNOSIS — Z7902 Long term (current) use of antithrombotics/antiplatelets: Secondary | ICD-10-CM | POA: Diagnosis not present

## 2023-11-30 DIAGNOSIS — J45909 Unspecified asthma, uncomplicated: Secondary | ICD-10-CM | POA: Diagnosis present

## 2023-11-30 DIAGNOSIS — I110 Hypertensive heart disease with heart failure: Secondary | ICD-10-CM | POA: Diagnosis present

## 2023-11-30 DIAGNOSIS — I482 Chronic atrial fibrillation, unspecified: Secondary | ICD-10-CM | POA: Diagnosis present

## 2023-11-30 DIAGNOSIS — Z1152 Encounter for screening for COVID-19: Secondary | ICD-10-CM | POA: Diagnosis not present

## 2023-11-30 DIAGNOSIS — F02A0 Dementia in other diseases classified elsewhere, mild, without behavioral disturbance, psychotic disturbance, mood disturbance, and anxiety: Secondary | ICD-10-CM | POA: Diagnosis present

## 2023-11-30 DIAGNOSIS — Z885 Allergy status to narcotic agent status: Secondary | ICD-10-CM | POA: Diagnosis not present

## 2023-11-30 DIAGNOSIS — G9340 Encephalopathy, unspecified: Secondary | ICD-10-CM | POA: Diagnosis not present

## 2023-11-30 DIAGNOSIS — D649 Anemia, unspecified: Secondary | ICD-10-CM | POA: Diagnosis present

## 2023-11-30 DIAGNOSIS — D696 Thrombocytopenia, unspecified: Secondary | ICD-10-CM | POA: Diagnosis present

## 2023-11-30 DIAGNOSIS — Z79899 Other long term (current) drug therapy: Secondary | ICD-10-CM | POA: Diagnosis not present

## 2023-11-30 DIAGNOSIS — K219 Gastro-esophageal reflux disease without esophagitis: Secondary | ICD-10-CM | POA: Diagnosis present

## 2023-11-30 DIAGNOSIS — E860 Dehydration: Secondary | ICD-10-CM | POA: Diagnosis present

## 2023-11-30 DIAGNOSIS — I480 Paroxysmal atrial fibrillation: Secondary | ICD-10-CM | POA: Diagnosis present

## 2023-11-30 DIAGNOSIS — Z8249 Family history of ischemic heart disease and other diseases of the circulatory system: Secondary | ICD-10-CM | POA: Diagnosis not present

## 2023-11-30 DIAGNOSIS — I5042 Chronic combined systolic (congestive) and diastolic (congestive) heart failure: Secondary | ICD-10-CM | POA: Diagnosis present

## 2023-11-30 DIAGNOSIS — Z888 Allergy status to other drugs, medicaments and biological substances status: Secondary | ICD-10-CM | POA: Diagnosis not present

## 2023-11-30 HISTORY — DX: Metabolic encephalopathy: G93.41

## 2023-11-30 LAB — COMPREHENSIVE METABOLIC PANEL
ALT: 19 U/L (ref 0–44)
AST: 29 U/L (ref 15–41)
Albumin: 2.5 g/dL — ABNORMAL LOW (ref 3.5–5.0)
Alkaline Phosphatase: 53 U/L (ref 38–126)
Anion gap: 11 (ref 5–15)
BUN: 51 mg/dL — ABNORMAL HIGH (ref 8–23)
CO2: 25 mmol/L (ref 22–32)
Calcium: 8.5 mg/dL — ABNORMAL LOW (ref 8.9–10.3)
Chloride: 100 mmol/L (ref 98–111)
Creatinine, Ser: 1.37 mg/dL — ABNORMAL HIGH (ref 0.44–1.00)
GFR, Estimated: 36 mL/min — ABNORMAL LOW (ref >60)
Glucose, Bld: 98 mg/dL (ref 70–99)
Potassium: 3.3 mmol/L — ABNORMAL LOW (ref 3.5–5.1)
Sodium: 136 mmol/L (ref 135–145)
Total Bilirubin: 1.2 mg/dL (ref 0.0–1.2)
Total Protein: 6 g/dL — ABNORMAL LOW (ref 6.5–8.1)

## 2023-11-30 LAB — RESPIRATORY PANEL BY PCR

## 2023-11-30 LAB — SARS CORONAVIRUS 2 BY RT PCR: SARS Coronavirus 2 by RT PCR: NEGATIVE

## 2023-11-30 LAB — CBC
HCT: 37 % (ref 36.0–46.0)
Hemoglobin: 12 g/dL (ref 12.0–15.0)
MCH: 32.2 pg (ref 26.0–34.0)
MCHC: 32.4 g/dL (ref 30.0–36.0)
MCV: 99.2 fL (ref 80.0–100.0)
Platelets: 70 10*3/uL — ABNORMAL LOW (ref 150–400)
RBC: 3.73 MIL/uL — ABNORMAL LOW (ref 3.87–5.11)
RDW: 14.4 % (ref 11.5–15.5)
WBC: 7.9 10*3/uL (ref 4.0–10.5)
nRBC: 0 % (ref 0.0–0.2)

## 2023-11-30 LAB — URINE CULTURE: Culture: NO GROWTH

## 2023-11-30 MED ORDER — POTASSIUM CHLORIDE CRYS ER 20 MEQ PO TBCR
40.0000 meq | EXTENDED_RELEASE_TABLET | Freq: Once | ORAL | Status: AC
  Administered 2023-11-30: 40 meq via ORAL
  Filled 2023-11-30: qty 2

## 2023-11-30 MED ORDER — SODIUM CHLORIDE 0.9 % IV SOLN
500.0000 mg | INTRAVENOUS | Status: DC
  Administered 2023-11-30 – 2023-12-02 (×3): 500 mg via INTRAVENOUS
  Filled 2023-11-30 (×3): qty 5

## 2023-11-30 MED ORDER — MELATONIN 3 MG PO TABS
3.0000 mg | ORAL_TABLET | Freq: Every day | ORAL | Status: DC
  Administered 2023-11-30 – 2023-12-01 (×2): 3 mg via ORAL
  Filled 2023-11-30 (×2): qty 1

## 2023-11-30 NOTE — Evaluation (Signed)
 Physical Therapy Evaluation  Patient Details Name: [name] MRN: [id number] DOB: 30-Nov-1927 Today's Date: 11/30/2023  History of Present Illness  Pt is a 89 y/o female who presents 11/29/2023 with AMS and decreased PO intake x2 days. Family reports pt had a syncopal episode on 11/27/2023. CT head negative, chest x-ray showed chronic lung disease with some interstitial changes and patchy mid and upper lobe opacities. PMH significant for HTN, chronic a-fib on amiodarone  not on anticoagulation, HF, memory impairment.  Clinical Impression  Pt admitted with above diagnosis. Pt currently with functional limitations due to the deficits listed below (see PT Problem List). PTA, family reports pt was managing well at home. Family alternates time with pt and she has support 24/7. Great granddaughter present throughout session and states she looks better today than since she has been in the hospital. Pt was able to demonstrate transfers and ambulation with up to +2 min assist and RW for support. Anticipate pt will continue to progress well - recommend home health PT follow up at d/c. Acutely, pt will benefit from acute skilled PT to increase their independence and safety with mobility to allow discharge.           If plan is discharge home, recommend the following: A little help with walking and/or transfers;A little help with bathing/dressing/bathroom;Assistance with cooking/housework;Assist for transportation;Help with stairs or ramp for entrance;Supervision due to cognitive status   Can travel by private vehicle        Equipment Recommendations None recommended by PT  Recommendations for Other Services       Functional Status Assessment Patient has had a recent decline in their functional status and demonstrates the ability to make significant improvements in function in a reasonable and predictable amount of time.     Precautions / Restrictions Precautions Precautions:  Fall Restrictions Weight Bearing Restrictions Per Provider Order: No      Mobility  Bed Mobility Overal bed mobility: Needs Assistance Bed Mobility: Supine to Sit     Supine to sit: Contact guard     General bed mobility comments: Increased time and effort but pt was able to transition to EOB without assistance.    Transfers Overall transfer level: Needs assistance Equipment used: Rolling walker (2 wheels) Transfers: Sit to/from Stand Sit to Stand: Min assist, +2 physical assistance, +2 safety/equipment           General transfer comment: Multimodal cues for hand placement on seated surface for safety. Min assist for power up to full stand.    Ambulation/Gait Ambulation/Gait assistance: Min assist, +2 safety/equipment Gait Distance (Feet): 15 Feet Assistive device: Rolling walker (2 wheels) Gait Pattern/deviations: Step-through pattern, Decreased stride length, Trunk flexed Gait velocity: Decreased Gait velocity interpretation: <1.31 ft/sec, indicative of household ambulator   General Gait Details: VC's for improved posture. Assist and cues for steering walker and navigating around room. Pt overall steady within frame of walker.  Stairs            Wheelchair Mobility     Tilt Bed    Modified Rankin (Stroke Patients Only)       Balance Overall balance assessment: Needs assistance Sitting-balance support: Feet supported, No upper extremity supported Sitting balance-Leahy Scale: Fair     Standing balance support: Bilateral upper extremity supported, During functional activity, Reliant on assistive device for balance Standing balance-Leahy Scale: Poor  Pertinent Vitals/Pain Pain Assessment Pain Assessment: Faces Faces Pain Scale: Hurts little more Pain Location: LUE Pain Descriptors / Indicators: Discomfort, Grimacing, Guarding Pain Intervention(s): Limited activity within patient's tolerance, Monitored  during session, Repositioned    Home Living Family/patient expects to be discharged to:: Private residence Living Arrangements: Children Available Help at Discharge: Family;Available 24 hours/day Type of Home: House Home Access: Ramped entrance     Alternate Level Stairs-Number of Steps: flight Home Layout: Two level;Laundry or work area in basement;Able to live on main level with bedroom/bathroom Home Equipment: Agricultural Consultant (2 wheels);Hospital bed;Shower seat;Grab bars - tub/shower;BSC/3in1;Grab bars - toilet      Prior Function Prior Level of Function : Needs assist             Mobility Comments: RW all the time ADLs Comments: Assist for bathing/dressing.     Extremity/Trunk Assessment   Upper Extremity Assessment Upper Extremity Assessment: Defer to OT evaluation    Lower Extremity Assessment Lower Extremity Assessment: Overall WFL for tasks assessed (Over all good age-appropriate strength)    Cervical / Trunk Assessment Cervical / Trunk Assessment: Kyphotic  Communication   Communication Communication: No apparent difficulties Cueing Techniques: Verbal cues;Gestural cues;Tactile cues  Cognition Arousal: Alert Behavior During Therapy: WFL for tasks assessed/performed Overall Cognitive Status: History of cognitive impairments - at baseline                                          General Comments      Exercises     Assessment/Plan    PT Assessment Patient needs continued PT services  PT Problem List Decreased activity tolerance;Decreased balance;Decreased mobility;Decreased knowledge of use of DME;Decreased safety awareness;Decreased knowledge of precautions;Pain;Decreased cognition       PT Treatment Interventions Gait training;DME instruction;Functional mobility training;Therapeutic activities;Therapeutic exercise;Balance training;Cognitive remediation;Patient/family education    PT Goals (Current goals can be found in the Care  Plan section)  Acute Rehab PT Goals Patient Stated Goal: None stated. Great granddaughter wants pt to return home with family support PT Goal Formulation: With patient/family Time For Goal Achievement: 12/14/23 Potential to Achieve Goals: Good    Frequency Min 1X/week     Co-evaluation PT/OT/SLP Co-Evaluation/Treatment: Yes Reason for Co-Treatment: Necessary to address cognition/behavior during functional activity;For patient/therapist safety;To address functional/ADL transfers PT goals addressed during session: Mobility/safety with mobility;Balance;Proper use of DME;Strengthening/ROM         AM-PAC PT 6 Clicks Mobility  Outcome Measure Help needed turning from your back to your side while in a flat bed without using bedrails?: A Little Help needed moving from lying on your back to sitting on the side of a flat bed without using bedrails?: A Little Help needed moving to and from a bed to a chair (including a wheelchair)?: A Little Help needed standing up from a chair using your arms (e.g., wheelchair or bedside chair)?: A Lot Help needed to walk in hospital room?: A Little Help needed climbing 3-5 steps with a railing? : Total 6 Click Score: 15    End of Session Equipment Utilized During Treatment: Gait belt Activity Tolerance: Patient tolerated treatment well Patient left: in chair;with call bell/phone within reach;with chair alarm set;with family/visitor present Nurse Communication: Mobility status (Telemetry not reading properly. On and off phone with central monitoring but unable to fix during session. RN notified.) PT Visit Diagnosis: Difficulty in walking, not elsewhere classified (R26.2)  Time: 8958-8880 PT Time Calculation (min) (ACUTE ONLY): 38 min   Charges:   PT Evaluation $PT Eval Low Complexity: 1 Low PT Treatments $Gait Training: 8-22 mins PT General Charges $$ ACUTE PT VISIT: 1 Visit         Leita Sable, PT, DPT Acute Rehabilitation  Services Secure Chat Preferred Office: [phone]   Leita JONETTA Sable 11/30/2023, 1:04 PM

## 2023-11-30 NOTE — Progress Notes (Signed)
 [facility] PROGRESS NOTE    Progress Note  [name]  FMW:[id number] DOB: Apr 04, 1928 DOA: 11/29/2023 PCP: Katheen Roselie Rockford, NP     Brief Narrative:   [name] is an 89 y.o. female past medical history significant for essential hypertension, hyperlipidemia, chronic atrial fibrillation on amiodarone  not on anticoagulation, chronic combined systolic and diastolic heart failure memory impairment come in for confusion weakness about 2 days prior to admission he reports that about 2 days prior to admission she passed out did not hit her head, CT of the head in the ED showed no acute abnormality chest x-ray showed chronic lung disease with some interstitial changes patchy mid and upper lobe opacities, she was afebrile satting 94% on room air, white count of 12.5  Assessment/Plan:   Acute metabolic encephalopathy: This encephalopathy started couple of days prior to admission. Has had decreased oral intake. Had a mild leukocytosis in the ED with a chest x-ray showing possible bilateral infiltrates. Urinalysis does not appear to show signs of infection. Creatinine is 1.8 which is elevated from her baseline.  Possible pneumonia/rule out UTI: With white blood cell count of 12.5 on admission chest x-ray showed early evidence of pneumonia, with a UA showing some bacteria urine cultures were sent. Was started on IV Rocephin  at azithromycin . Blood cultures have been sent. Continue to monitor fever curve she has remained afebrile Check respiratory panel COVID-19.  Acute kidney injury: With a baseline creatinine of around 1 At home she is on Lasix  and Aldactone  these have been held.  In the setting of decreased oral intake She was started on IV fluids, possibly dynamically mediated. Basic metabolic panels pending this morning.  Essential hypertension: Holding antihypertensive medication blood pressure is borderline low she was started on IV fluids. Her blood pressure is  slowly improving her heart rate has remained in sinus bradycardia.  Dyslipidemia: Continue statin and Repatha .  Asthma: Continue butyryl as needed.  History of CAD: Holding Coreg  and Imdur  continue Plavix .  Chronic atrial fibrillation: Continue current home dose of amiodarone  she is not on anticoagulation.  Chronic systolic and diastolic heart failure: With last 2D echo in 2023 showed an EF of 35%. Due to borderline blood pressure holding Lasix , Aldactone  and Coreg . Will start on gentle IV fluids continue to monitor strict I's and O's and daily weights.   DVT prophylaxis: lovenox  Family Communication: Great granddaughter Status is: Observation The patient will require care spanning > 2 midnights and should be moved to inpatient because: Community-acquired pneumonia and acute kidney injury    Code Status:     Code Status Orders  (From admission, onward)           Start     Ordered   11/29/23 1929  Do not attempt resuscitation (DNR)- Limited -Do Not Intubate (DNI)  Continuous       Question Answer Comment  If pulseless and not breathing No CPR or chest compressions.   In Pre-Arrest Conditions (Patient Is Breathing and Has A Pulse) Do not intubate. Provide all appropriate non-invasive medical interventions. Avoid ICU transfer unless indicated or required.   Consent: Discussion documented in EHR or advanced directives reviewed      11/29/23 1930           Code Status History     Date Active Date Inactive Code Status Order ID Comments User Context   11/30/2022 2336 12/02/2022 1708 DNR [id number]  Tobie Jorie SAUNDERS, MD ED   10/26/2022 0003 10/26/2022 2129 DNR [id number]  Charlton Evalene RAMAN, MD ED   08/26/2022 0437 09/02/2022 1912 DNR [id number]  Alfornia Madison, MD ED   08/26/2022 0413 08/26/2022 0437 DNR [id number]  Alfornia Madison, MD ED   06/02/2022 1807 06/06/2022 1754 Full Code [id number]  Sebastian Toribio GAILS, MD ED   03/07/2021 0834 03/07/2021 1652 Full Code [id number]   Claudene Victory ORN, MD Inpatient   06/06/2020 1456 06/07/2020 1727 Full Code [id number]  Jordan, Peter M, MD Inpatient   05/24/2020 1458 05/25/2020 1710 Full Code [id number]  Jordan, Peter M, MD Inpatient   04/11/2014 1027 04/12/2014 1323 Full Code [id number]  Claudene Victory ORN, MD Inpatient         IV Access:   Peripheral IV   Procedures and diagnostic studies:   CT Head Wo Contrast Result Date: 11/29/2023 CLINICAL DATA:  Mental status change EXAM: CT HEAD WITHOUT CONTRAST TECHNIQUE: Contiguous axial images were obtained from the base of the skull through the vertex without intravenous contrast. RADIATION DOSE REDUCTION: This exam was performed according to the departmental dose-optimization program which includes automated exposure control, adjustment of the mA and/or kV according to patient size and/or use of iterative reconstruction technique. COMPARISON:  Head CT 12/11/2022.  MRI brain 09/08/2022. FINDINGS: Brain: No evidence of acute infarction, hemorrhage, hydrocephalus, extra-axial collection or mass lesion/mass effect. There is moderate diffuse atrophy. There is mild periventricular white matter hypodensity, likely chronic small vessel ischemic change. This is similar to prior study. Vascular: Atherosclerotic calcifications are present within the cavernous internal carotid arteries. There also atherosclerotic calcifications of the intracranial vertebral arteries. Skull: Normal. Negative for fracture or focal lesion. Sinuses/Orbits: No acute finding. Other: None. IMPRESSION: 1. No acute intracranial process. 2. Moderate diffuse atrophy and mild chronic small vessel ischemic change. Electronically Signed   By: Greig Pique M.D.   On: 11/29/2023 17:58   DG Chest 2 View Result Date: 11/29/2023 CLINICAL DATA:  Failure to thrive EXAM: CHEST - 2 VIEW COMPARISON:  11/30/2022, 10/25/2022, chest CT 10/25/2022 FINDINGS: Underlying chronic lung disease with mild fibrosis. Patchy right mid and upper lung slightly  nodular opacities. Normal cardiac size. Aortic atherosclerosis. IMPRESSION: Underlying chronic lung disease with mild fibrosis. Patchy right mid and upper lung slightly nodular opacities, favor infectious or inflammatory, continued radiographic follow-up to resolution is recommended. Electronically Signed   By: Luke Bun M.D.   On: 11/29/2023 15:48     Medical Consultants:   None.   Subjective:    [name] staring she relates he has no new complaints  Objective:    Vitals:   11/29/23 2136 11/29/23 2225 11/30/23 0443 11/30/23 0500  BP:  (!) 109/49 (!) 108/48   Pulse:  68 (!) 59   Resp:      Temp: 99.2 F (37.3 C) 98.8 F (37.1 C) 98.2 F (36.8 C)   TempSrc: Oral Oral Oral   SpO2:  95% 94%   Weight:    56.8 kg  Height:       SpO2: 94 %   Intake/Output Summary (Last 24 hours) at 11/30/2023 0654 Last data filed at 11/30/2023 0301 Gross per 24 hour  Intake 976.67 ml  Output --  Net 976.67 ml   Filed Weights   11/29/23 1334 11/30/23 0500  Weight: 56.7 kg 56.8 kg    Exam: General exam: In no acute distress. Respiratory system: Good air movement and clear to auscultation. Cardiovascular system: S1 & S2 heard, RRR. No JVD. Gastrointestinal system: Abdomen is nondistended, soft and nontender.  Central nervous system: Alert  and oriented to person nonfocal Extremities: No pedal edema. Skin: Blanchable rash in arms and legs Psychiatry: No judgment or insight of medical condition all   Data Reviewed:    Labs: Basic Metabolic Panel: Recent Labs  Lab 11/29/23 1433 11/29/23 1440  NA 135 135  K 3.8 3.8  CL 97* 98  CO2 24  --   GLUCOSE 120* 117*  BUN 51* 55*  CREATININE 1.82* 1.90*  CALCIUM  8.8*  --    GFR Estimated Creatinine Clearance: 13.8 mL/min (A) (by C-G formula based on SCr of 1.9 mg/dL (H)). Liver Function Tests: Recent Labs  Lab 11/29/23 1433  AST 43*  ALT 19  ALKPHOS 59  BILITOT 1.4*  PROT 6.9  ALBUMIN 3.0*   No results for  input(s): LIPASE, AMYLASE in the last 168 hours. No results for input(s): AMMONIA in the last 168 hours. Coagulation profile Recent Labs  Lab 11/29/23 1433  INR 1.1   COVID-19 Labs  No results for input(s): DDIMER, FERRITIN, LDH, CRP in the last 72 hours.  Lab Results  Component Value Date   SARSCOV2NAA NEGATIVE 11/30/2022   SARSCOV2NAA NEGATIVE 10/25/2022   SARSCOV2NAA NEGATIVE 08/30/2022   SARSCOV2NAA NEGATIVE 03/05/2021    CBC: Recent Labs  Lab 11/29/23 1433 11/29/23 1440  WBC 12.5*  --   NEUTROABS 9.7*  --   HGB 13.6 14.3  HCT 41.7 42.0  MCV 100.2*  --   PLT 74*  --    Cardiac Enzymes: No results for input(s): CKTOTAL, CKMB, CKMBINDEX, TROPONINI in the last 168 hours. BNP (last 3 results) No results for input(s): PROBNP in the last 8760 hours. CBG: Recent Labs  Lab 11/29/23 1434  GLUCAP 102*   D-Dimer: No results for input(s): DDIMER in the last 72 hours. Hgb A1c: No results for input(s): HGBA1C in the last 72 hours. Lipid Profile: No results for input(s): CHOL, HDL, LDLCALC, TRIG, CHOLHDL, LDLDIRECT in the last 72 hours. Thyroid  function studies: No results for input(s): TSH, T4TOTAL, T3FREE, THYROIDAB in the last 72 hours.  Invalid input(s): FREET3 Anemia work up: No results for input(s): VITAMINB12, FOLATE, FERRITIN, TIBC, IRON, RETICCTPCT in the last 72 hours. Sepsis Labs: Recent Labs  Lab 11/29/23 1433  WBC 12.5*   Microbiology No results found for this or any previous visit (from the past 240 hours).   Medications:    amiodarone   100 mg Oral Daily   clopidogrel   75 mg Oral Q breakfast   ezetimibe   10 mg Oral Daily   sodium chloride  flush  3 mL Intravenous Q12H   Continuous Infusions:  sodium chloride  100 mL/hr at 11/30/23 0415   cefTRIAXone  (ROCEPHIN )  IV        LOS: 0 days   [name]  [facility]  11/30/2023, 6:54 AM

## 2023-11-30 NOTE — Evaluation (Addendum)
 Occupational Therapy Evaluation Patient Details Name: [name] MRN: [id number] DOB: [birth year] Today's Date: 11/30/2023   History of Present Illness Pt is a 89 y/o female who presents 11/29/2023 with AMS and decreased PO intake x2 days. Family reports pt had a syncopal episode on 11/27/2023. CT head negative, chest x-ray showed chronic lung disease with some interstitial changes and patchy mid and upper lobe opacities. PMH significant for HTN, chronic a-fib on amiodarone  not on anticoagulation, HF, memory impairment.   Clinical Impression   Pt admitted with the above diagnoses and presents with below problem list. Pt will benefit from continued acute OT to address the below listed deficits and maximize independence with basic ADLs prior to d/c. At baseline, pt lives at home and has 24hr supervision provided by her 2 daughters; some assist with bathing/dressing, walker at baseline. Today pt needs min A with functional mobility/transfers, up to mod A with LB ADLs. Pt's great-granddaughter reports pt is moving better today than yesterday but is not back to baseline with functional mobility yet.        If plan is discharge home, recommend the following: A little help with walking and/or transfers;A little help with bathing/dressing/bathroom;Assistance with cooking/housework;Direct supervision/assist for medications management;Direct supervision/assist for financial management;Assist for transportation;Supervision due to cognitive status    Functional Status Assessment  Patient has had a recent decline in their functional status and demonstrates the ability to make significant improvements in function in a reasonable and predictable amount of time.  Equipment Recommendations  None recommended by OT    Recommendations for Other Services       Precautions / Restrictions Precautions Precautions: Fall Restrictions Weight Bearing Restrictions Per Provider Order: No      Mobility Bed  Mobility Overal bed mobility: Needs Assistance Bed Mobility: Supine to Sit     Supine to sit: Contact guard     General bed mobility comments: Increased time and effort but pt was able to transition to EOB without assistance.    Transfers Overall transfer level: Needs assistance Equipment used: Rolling walker (2 wheels) Transfers: Sit to/from Stand Sit to Stand: Min assist, +2 physical assistance, +2 safety/equipment           General transfer comment: Multimodal cues for hand placement on seated surface for safety. Min assist for power up to full stand.      Balance Overall balance assessment: Needs assistance Sitting-balance support: Feet supported, No upper extremity supported Sitting balance-Leahy Scale: Fair     Standing balance support: Bilateral upper extremity supported, During functional activity, Reliant on assistive device for balance Standing balance-Leahy Scale: Poor                             ADL either performed or assessed with clinical judgement   ADL Overall ADL's : Needs assistance/impaired Eating/Feeding: Set up;Sitting   Grooming: Minimal assistance;Sitting;Set up   Upper Body Bathing: Moderate assistance;Sitting   Lower Body Bathing: Moderate assistance;Sit to/from stand   Upper Body Dressing : Moderate assistance;Sitting   Lower Body Dressing: Moderate assistance;Sit to/from stand   Toilet Transfer: Minimal assistance;+2 for safety/equipment           Functional mobility during ADLs: Minimal assistance;+2 for safety/equipment;Rolling walker (2 wheels)       Vision         Perception         Praxis         Pertinent Vitals/Pain Pain Assessment  Pain Assessment: Faces Faces Pain Scale: Hurts little more Pain Location: LUE, IV infiltration Pain Descriptors / Indicators: Discomfort, Grimacing, Guarding Pain Intervention(s): Limited activity within patient's tolerance, Monitored during session, Repositioned      Extremity/Trunk Assessment Upper Extremity Assessment Upper Extremity Assessment: Generalized weakness   Lower Extremity Assessment Lower Extremity Assessment: Defer to PT evaluation   Cervical / Trunk Assessment Cervical / Trunk Assessment: Kyphotic   Communication Communication Communication: No apparent difficulties Cueing Techniques: Gestural cues;Verbal cues;Tactile cues   Cognition Arousal: Alert Behavior During Therapy: WFL for tasks assessed/performed Overall Cognitive Status: History of cognitive impairments - at baseline                                       General Comments  Grandaughter present and provided home setup and PLOF    Exercises     Shoulder Instructions      Home Living Family/patient expects to be discharged to:: Private residence Living Arrangements: Children Available Help at Discharge: Family;Available 24 hours/day Type of Home: House Home Access: Ramped entrance     Home Layout: Two level;Laundry or work area in basement;Able to live on main level with bedroom/bathroom Alternate Teacher, Music of Steps: flight Alternate Level Stairs-Rails: Left Bathroom Shower/Tub: Estate Manager/land Agent Accessibility: Yes   Home Equipment: Agricultural Consultant (2 wheels);Hospital bed;Shower seat;Grab bars - tub/shower;BSC/3in1;Grab bars - toilet   Additional Comments: 2 daughters rotate staying with her to provide 24hr supervision      Prior Functioning/Environment Prior Level of Function : Needs assist             Mobility Comments: RW all the time ADLs Comments: Assist for bathing/dressing.        OT Problem List: Decreased strength;Impaired balance (sitting and/or standing);Decreased activity tolerance;Decreased knowledge of use of DME or AE;Decreased knowledge of precautions;Pain      OT Treatment/Interventions: Self-care/ADL training;Therapeutic exercise;Energy conservation;DME and/or AE  instruction;Therapeutic activities;Patient/family education;Balance training    OT Goals(Current goals can be found in the care plan section) Acute Rehab OT Goals Patient Stated Goal: pt: not stated. grandaughter: return to baseline with ADLs OT Goal Formulation: With patient/family Time For Goal Achievement: 12/14/23 Potential to Achieve Goals: Good ADL Goals Pt Will Perform Upper Body Dressing: with min assist;sitting Pt Will Perform Lower Body Dressing: with min assist;sit to/from stand Pt Will Transfer to Toilet: with contact guard assist;ambulating Pt Will Perform Toileting - Clothing Manipulation and hygiene: with contact guard assist;with min assist;sit to/from stand Additional ADL Goal #1: Pt will complete bed mobility at supervision level to prepare for EOB/OOB ADLs.  OT Frequency: Min 2X/week    Co-evaluation PT/OT/SLP Co-Evaluation/Treatment: Yes Reason for Co-Treatment: Necessary to address cognition/behavior during functional activity;For patient/therapist safety;To address functional/ADL transfers PT goals addressed during session: Mobility/safety with mobility;Balance;Proper use of DME;Strengthening/ROM OT goals addressed during session: ADL's and self-care;Proper use of Adaptive equipment and DME;Strengthening/ROM      AM-PAC OT 6 Clicks Daily Activity     Outcome Measure Help from another person eating meals?: A Little Help from another person taking care of personal grooming?: A Little Help from another person toileting, which includes using toliet, bedpan, or urinal?: A Lot Help from another person bathing (including washing, rinsing, drying)?: A Little Help from another person to put on and taking off regular upper body clothing?: A Little Help from another person to put on and taking  off regular lower body clothing?: None 6 Click Score: 18   End of Session Equipment Utilized During Treatment: Rolling walker (2 wheels);Gait belt  Activity Tolerance: Patient  limited by fatigue;Patient tolerated treatment well Patient left: in chair;with call bell/phone within reach;with chair alarm set;with family/visitor present  OT Visit Diagnosis: Unsteadiness on feet (R26.81);Muscle weakness (generalized) (M62.81);Pain;Other symptoms and signs involving cognitive function;History of falling (Z91.81)                Time: 8958-8883 OT Time Calculation (min): 35 min Charges:  OT General Charges $OT Visit: 1 Visit OT Evaluation $OT Eval Low Complexity: 1 Low  Lamarr Hoots, OT Acute Rehabilitation Services Office: [phone]   Hoots Lamarr DEL 11/30/2023, 2:02 PM

## 2023-11-30 NOTE — TOC Initial Note (Addendum)
 Transition of Care (TOC) - Initial/Assessment Note   Spoke to patient and daughter [name] at bedside. [name] confirmed face sheet information. Patient from home. Family takes turns staying with her 24/7.   Patient has a walker and bedside commode at home already.   PT/OT recommending home health. Provided [name] with Medicare.gov list of home health agencies. She will discuss with family and call NCM with their top three choices. [name] has NCM direct number   [name] called back , their choices are 1) [facility] as long as it's not [facility] , 2) [facility], 3) [facility] . Called [place] with [facility] he can accept and aware they do not want [facility] . Called [name] back and [facility] aware . Entered HH orders and face to face for MD to sign. Secure chatted Odell Celinda Balo  Patient Details  Name: [name] MRN: [id number] Date of Birth: [birth year]  Transition of Care [facility]) CM/SW Contact:    Stephane Powell Jansky, RN Phone Number: 11/30/2023, 2:57 PM  Clinical Narrative:                   Expected Discharge Plan: Home w Home Health Services Barriers to Discharge: Continued Medical Work up   Patient Goals and CMS Choice Patient states their goals for this hospitalization and ongoing recovery are:: to return to home CMS Medicare.gov Compare Post Acute Care list provided to:: Patient Represenative (must comment) (daughter [name]) Choice offered to / list presented to : Adult Children      Expected Discharge Plan and Services   Discharge Planning Services: CM Consult Post Acute Care Choice: Home Health Living arrangements for the past 2 months: Single Family Home                 DME Arranged: N/A DME Agency: NA       HH Arranged: PT, OT (see note)          Prior Living Arrangements/Services Living arrangements for the past 2 months: Single Family Home Lives with:: Self (family takes turns staying with her) Patient language and need for interpreter reviewed:: Yes Do you feel safe going back to  the place where you live?: Yes      Need for Family Participation in Patient Care: Yes (Comment) Care giver support system in place?: Yes (comment) Current home services: DME Criminal Activity/Legal Involvement Pertinent to Current Situation/Hospitalization: No - Comment as needed  Activities of Daily Living   ADL Screening (condition at time of admission) Independently performs ADLs?: Yes (appropriate for developmental age) Is the patient deaf or have difficulty hearing?: Yes Does the patient have difficulty seeing, even when wearing glasses/contacts?: No Does the patient have difficulty concentrating, remembering, or making decisions?: No  Permission Sought/Granted   Permission granted to share information with : Yes, Verbal Permission Granted  Share Information with NAME: daughter [name]           Emotional Assessment Appearance:: Appears stated age     Orientation: : Oriented to Self, Oriented to Place Alcohol / Substance Use: Not Applicable Psych Involvement: No (comment)  Admission diagnosis:  Syncope and collapse [R55] Dehydration [E86.0] Uremia [N19] History of CHF (congestive heart failure) [Z86.79] Acute encephalopathy [G93.40] Fall, initial encounter [W19.XXXA] Acute metabolic encephalopathy [G93.41] Patient Active Problem List   Diagnosis Date Noted   Acute metabolic encephalopathy 11/30/2023   Acute encephalopathy 11/29/2023   Memory impairment 01/11/2023   History of cervical fracture 11/30/2022   Prediabetes 11/04/2022   Maxillary sinus fracture, sequela (HCC) 10/26/2022  History of fracture of orbit 10/25/2022   PAF (paroxysmal atrial fibrillation) (HCC) 09/08/2022   Syncope 06/02/2022   Orthostatic hypotension 06/02/2022   Coronary artery disease involving native coronary artery of native heart with angina pectoris (HCC) 06/02/2022   Statin myopathy 05/30/2021   Chronic combined systolic and diastolic CHF (congestive heart failure) (HCC) 06/07/2020    Anemia 06/09/2013   Psoriasis 01/31/2013   Essential hypertension 07/08/2012   Vitamin D  deficiency 12/22/2011   History of rib fracture 11/25/2011   GERD (gastroesophageal reflux disease) 11/25/2011   Atherosclerotic heart disease of native coronary artery without angina pectoris 05/28/2011   Hyperlipidemia 12/05/2008   Asthma 12/05/2008   Diverticulosis of colon 12/05/2008   HEMOCCULT POSITIVE STOOL 12/05/2008   Arthropathy 12/18/2006   Osteopenia 12/18/2006   PCP:  Katheen Roselie Rockford, NP Pharmacy:   [facility]) - [place], [place] - [address] [facility] [address] [address] [facility] [address] [place] [address] Phone: [phone] Fax: [phone]  [id number] - [place], [place] - [address] [place] [address] Phone: [phone] Fax: [phone]  [facility] - [place], [place] - [address] [place] [address] Phone: [phone] Fax: [phone]  [facility] [address] [place] [address] Phone: [phone] Fax: [phone]     Social Drivers of Health (SDOH) Social History: SDOH Screenings   Food Insecurity: No Food Insecurity (11/30/2023)  Housing: Low Risk  (11/30/2023)  Transportation Needs: No Transportation Needs (11/30/2023)  Utilities: Not At Risk (11/30/2023)  Alcohol Screen: Low Risk  (08/26/2022)  Depression (PHQ2-9): Medium Risk (01/11/2023)  Financial Resource Strain: Low Risk  (08/26/2022)  Social Connections: Unknown (11/30/2023)  Tobacco Use: Low Risk  (11/30/2023)   SDOH Interventions:     Readmission Risk Interventions     No data to display

## 2023-12-01 DIAGNOSIS — G9340 Encephalopathy, unspecified: Secondary | ICD-10-CM | POA: Diagnosis not present

## 2023-12-01 NOTE — Progress Notes (Addendum)
 [facility] PROGRESS NOTE    Progress Note  [name]  FMW:[id number] DOB: September 28, 1928 DOA: 11/29/2023 PCP: Katheen Roselie Rockford, NP     Brief Narrative:   [name] is an 89 y.o. female past medical history significant for essential hypertension, hyperlipidemia, chronic atrial fibrillation on amiodarone  not on anticoagulation, chronic combined systolic and diastolic heart failure memory impairment come in for confusion weakness about 2 days prior to admission he reports that about 2 days prior to admission she passed out did not hit her head, CT of the head in the ED showed no acute abnormality chest x-ray showed chronic lung disease with some interstitial changes patchy mid and upper lobe opacities, she was afebrile satting 94% on room air, white count of 12.5  Assessment/Plan:   Acute metabolic encephalopathy: Multifactorial likely due to decreased oral intake with new acute kidney injury and infectious etiology. Creatinine is 1.8 which is elevated from her baseline. PT evaluated the patient, will need home health PT.  Possible pneumonia/rule out UTI: On admission was started empirically on IV Rocephin  and azithromycin  leukocytosis resolved she has remained febrile. Blood cultures have been negative till date. Respiratory panel was negative SARS-CoV-2 was negative.  Acute kidney injury: With a baseline creatinine of around 1, on admission 1.9. Likely hemodynamically mediated diuretics were held she was started on IV fluids. Repeat a basic metabolic panel in the morning.  Essential hypertension: Holding antihypertensive medication blood pressure is borderline low she was started on IV fluids. Her blood pressure is slowly improving, heart rate has remained in sinus bradycardia continue to monitor blood pressure closely.  Dyslipidemia: Continue statin and Repatha .  Asthma: Continue butyryl as needed.  History of CAD: Holding Coreg  and Imdur  continue  Plavix .  Chronic atrial fibrillation: Continue current home dose of amiodarone  she is not on anticoagulation.  Thrombocytopenia: Rechck tmrw.  Chronic systolic and diastolic heart failure: With last 2D echo in 2023 showed an EF of 35%. Due to borderline blood pressure and AKI; holding Lasix , Aldactone  and Coreg . KVO monitor strict I's and O's and daily weights.  Hypokalemia: Repleted, orally.   DVT prophylaxis: lovenox  Family Communication: Great granddaughter Status is: Observation The patient will require care spanning > 2 midnights and should be moved to inpatient because: Community-acquired pneumonia and acute kidney injury    Code Status:     Code Status Orders  (From admission, onward)           Start     Ordered   11/29/23 1929  Do not attempt resuscitation (DNR)- Limited -Do Not Intubate (DNI)  Continuous       Question Answer Comment  If pulseless and not breathing No CPR or chest compressions.   In Pre-Arrest Conditions (Patient Is Breathing and Has A Pulse) Do not intubate. Provide all appropriate non-invasive medical interventions. Avoid ICU transfer unless indicated or required.   Consent: Discussion documented in EHR or advanced directives reviewed      11/29/23 1930           Code Status History     Date Active Date Inactive Code Status Order ID Comments User Context   11/30/2022 2336 12/02/2022 1708 DNR [id number]  Tobie Jorie SAUNDERS, MD ED   10/26/2022 0003 10/26/2022 2129 DNR [id number]  Charlton Evalene RAMAN, MD ED   08/26/2022 0437 09/02/2022 1912 DNR [id number]  Alfornia Madison, MD ED   08/26/2022 0413 08/26/2022 0437 DNR [id number]  Alfornia Madison, MD ED   06/02/2022 1807 06/06/2022 1754 Full  Code [id number]  Sebastian Toribio GAILS, MD ED   03/07/2021 0834 03/07/2021 1652 Full Code [id number]  Claudene Victory ORN, MD Inpatient   06/06/2020 1456 06/07/2020 1727 Full Code [id number]  Jordan, Peter M, MD Inpatient   05/24/2020 1458 05/25/2020 1710 Full Code [id number]  Jordan,  Peter M, MD Inpatient   04/11/2014 1027 04/12/2014 1323 Full Code [id number]  Claudene Victory ORN, MD Inpatient         IV Access:   Peripheral IV   Procedures and diagnostic studies:   CT Head Wo Contrast Result Date: 11/29/2023 CLINICAL DATA:  Mental status change EXAM: CT HEAD WITHOUT CONTRAST TECHNIQUE: Contiguous axial images were obtained from the base of the skull through the vertex without intravenous contrast. RADIATION DOSE REDUCTION: This exam was performed according to the departmental dose-optimization program which includes automated exposure control, adjustment of the mA and/or kV according to patient size and/or use of iterative reconstruction technique. COMPARISON:  Head CT 12/11/2022.  MRI brain 09/08/2022. FINDINGS: Brain: No evidence of acute infarction, hemorrhage, hydrocephalus, extra-axial collection or mass lesion/mass effect. There is moderate diffuse atrophy. There is mild periventricular white matter hypodensity, likely chronic small vessel ischemic change. This is similar to prior study. Vascular: Atherosclerotic calcifications are present within the cavernous internal carotid arteries. There also atherosclerotic calcifications of the intracranial vertebral arteries. Skull: Normal. Negative for fracture or focal lesion. Sinuses/Orbits: No acute finding. Other: None. IMPRESSION: 1. No acute intracranial process. 2. Moderate diffuse atrophy and mild chronic small vessel ischemic change. Electronically Signed   By: Greig Pique M.D.   On: 11/29/2023 17:58   DG Chest 2 View Result Date: 11/29/2023 CLINICAL DATA:  Failure to thrive EXAM: CHEST - 2 VIEW COMPARISON:  11/30/2022, 10/25/2022, chest CT 10/25/2022 FINDINGS: Underlying chronic lung disease with mild fibrosis. Patchy right mid and upper lung slightly nodular opacities. Normal cardiac size. Aortic atherosclerosis. IMPRESSION: Underlying chronic lung disease with mild fibrosis. Patchy right mid and upper lung slightly nodular  opacities, favor infectious or inflammatory, continued radiographic follow-up to resolution is recommended. Electronically Signed   By: Luke Bun M.D.   On: 11/29/2023 15:48     Medical Consultants:   None.   Subjective:    Annett Boxwell Colston no complains talkative this am  Objective:    Vitals:   11/30/23 2249 12/01/23 0347 12/01/23 0348 12/01/23 0856  BP: (!) 107/59  (!) 106/52 (!) 122/57  Pulse: (!) 52  (!) 50 (!) 53  Resp: 18  17 17   Temp: 97.6 F (36.4 C)  (!) 97.5 F (36.4 C)   TempSrc: Oral  Oral   SpO2: 97%  96% 97%  Weight:  57.3 kg    Height:       SpO2: 97 %   Intake/Output Summary (Last 24 hours) at 12/01/2023 0903 Last data filed at 12/01/2023 0417 Gross per 24 hour  Intake 1126.33 ml  Output 2 ml  Net 1124.33 ml   Filed Weights   11/29/23 1334 11/30/23 0500 12/01/23 0347  Weight: 56.7 kg 56.8 kg 57.3 kg    Exam: General exam: In no acute distress. Respiratory system: Good air movement and clear to auscultation. Cardiovascular system: S1 & S2 heard, RRR. No JVD. Gastrointestinal system: Abdomen is nondistended, soft and nontender.  Extremities: No pedal edema. Skin: No rashes, lesions or ulcers Psychiatry: Judgement and insight appear normal. Mood & affect appropriate.  Data Reviewed:    Labs: Basic Metabolic Panel: Recent Labs  Lab 11/29/23 1433 11/29/23 1440 11/30/23  0646  NA 135 135 136  K 3.8 3.8 3.3*  CL 97* 98 100  CO2 24  --  25  GLUCOSE 120* 117* 98  BUN 51* 55* 51*  CREATININE 1.82* 1.90* 1.37*  CALCIUM  8.8*  --  8.5*   GFR Estimated Creatinine Clearance: 19.2 mL/min (A) (by C-G formula based on SCr of 1.37 mg/dL (H)). Liver Function Tests: Recent Labs  Lab 11/29/23 1433 11/30/23 0646  AST 43* 29  ALT 19 19  ALKPHOS 59 53  BILITOT 1.4* 1.2  PROT 6.9 6.0*  ALBUMIN 3.0* 2.5*   No results for input(s): LIPASE, AMYLASE in the last 168 hours. No results for input(s): AMMONIA in the last 168  hours. Coagulation profile Recent Labs  Lab 11/29/23 1433  INR 1.1   COVID-19 Labs  No results for input(s): DDIMER, FERRITIN, LDH, CRP in the last 72 hours.  Lab Results  Component Value Date   SARSCOV2NAA NEGATIVE 11/30/2023   SARSCOV2NAA NEGATIVE 11/30/2022   SARSCOV2NAA NEGATIVE 10/25/2022   SARSCOV2NAA NEGATIVE 08/30/2022    CBC: Recent Labs  Lab 11/29/23 1433 11/29/23 1440 11/30/23 0646  WBC 12.5*  --  7.9  NEUTROABS 9.7*  --   --   HGB 13.6 14.3 12.0  HCT 41.7 42.0 37.0  MCV 100.2*  --  99.2  PLT 74*  --  70*   Cardiac Enzymes: No results for input(s): CKTOTAL, CKMB, CKMBINDEX, TROPONINI in the last 168 hours. BNP (last 3 results) No results for input(s): PROBNP in the last 8760 hours. CBG: Recent Labs  Lab 11/29/23 1434  GLUCAP 102*   D-Dimer: No results for input(s): DDIMER in the last 72 hours. Hgb A1c: No results for input(s): HGBA1C in the last 72 hours. Lipid Profile: No results for input(s): CHOL, HDL, LDLCALC, TRIG, CHOLHDL, LDLDIRECT in the last 72 hours. Thyroid  function studies: No results for input(s): TSH, T4TOTAL, T3FREE, THYROIDAB in the last 72 hours.  Invalid input(s): FREET3 Anemia work up: No results for input(s): VITAMINB12, FOLATE, FERRITIN, TIBC, IRON, RETICCTPCT in the last 72 hours. Sepsis Labs: Recent Labs  Lab 11/29/23 1433 11/30/23 0646  WBC 12.5* 7.9   Microbiology Recent Results (from the past 240 hours)  Urine Culture     Status: None   Collection Time: 11/29/23  4:00 PM   Specimen: Urine, Clean Catch  Result Value Ref Range Status   Specimen Description URINE, CLEAN CATCH  Final   Special Requests NONE  Final   Culture   Final    NO GROWTH Performed at [facility], [address] [place], [place] [address]    Report Status 11/30/2023 FINAL  Final  Respiratory (~20 pathogens) panel by PCR     Status: None   Collection Time: 11/30/23  7:16 AM    Specimen: Nasopharyngeal Swab; Respiratory  Result Value Ref Range Status   Adenovirus NOT DETECTED NOT DETECTED Final   Coronavirus 229E NOT DETECTED NOT DETECTED Final    Comment: (NOTE) The Coronavirus on the Respiratory Panel, DOES NOT test for the novel  Coronavirus (2019 nCoV)    Coronavirus HKU1 NOT DETECTED NOT DETECTED Final   Coronavirus NL63 NOT DETECTED NOT DETECTED Final   Coronavirus OC43 NOT DETECTED NOT DETECTED Final   Metapneumovirus NOT DETECTED NOT DETECTED Final   Rhinovirus / Enterovirus NOT DETECTED NOT DETECTED Final   Influenza A NOT DETECTED NOT DETECTED Final   Influenza B NOT DETECTED NOT DETECTED Final   Parainfluenza Virus 1 NOT DETECTED NOT  DETECTED Final   Parainfluenza Virus 2 NOT DETECTED NOT DETECTED Final   Parainfluenza Virus 3 NOT DETECTED NOT DETECTED Final   Parainfluenza Virus 4 NOT DETECTED NOT DETECTED Final   Respiratory Syncytial Virus NOT DETECTED NOT DETECTED Final   Bordetella pertussis NOT DETECTED NOT DETECTED Final   Bordetella Parapertussis NOT DETECTED NOT DETECTED Final   Chlamydophila pneumoniae NOT DETECTED NOT DETECTED Final   Mycoplasma pneumoniae NOT DETECTED NOT DETECTED Final    Comment: Performed at [facility], [address] [place], [place] [address]  SARS Coronavirus 2 by RT PCR (hospital order, performed in [facility] hospital lab) *cepheid single result test* Anterior Nasal Swab     Status: None   Collection Time: 11/30/23  7:16 AM   Specimen: Anterior Nasal Swab  Result Value Ref Range Status   SARS Coronavirus 2 by RT PCR NEGATIVE NEGATIVE Final    Comment: Performed at [facility], [address] [place], [place] [address]     Medications:    amiodarone   100 mg Oral Daily   clopidogrel   75 mg Oral Q breakfast   ezetimibe   10 mg Oral Daily   melatonin  3 mg Oral QHS   sodium chloride  flush  3 mL Intravenous Q12H   Continuous Infusions:  azithromycin  500 mg (12/01/23 0841)    cefTRIAXone  (ROCEPHIN )  IV 1 g (11/30/23 1621)      LOS: 1 day   Erle Odell Castor  [facility]  12/01/2023, 9:03 AM

## 2023-12-01 NOTE — Plan of Care (Signed)

## 2023-12-02 DIAGNOSIS — G9340 Encephalopathy, unspecified: Secondary | ICD-10-CM | POA: Diagnosis not present

## 2023-12-02 LAB — BASIC METABOLIC PANEL
Anion gap: 9 (ref 5–15)
BUN: 27 mg/dL — ABNORMAL HIGH (ref 8–23)
CO2: 22 mmol/L (ref 22–32)
Calcium: 8.6 mg/dL — ABNORMAL LOW (ref 8.9–10.3)
Chloride: 105 mmol/L (ref 98–111)
Creatinine, Ser: 0.92 mg/dL (ref 0.44–1.00)
GFR, Estimated: 58 mL/min — ABNORMAL LOW (ref >60)
Glucose, Bld: 95 mg/dL (ref 70–99)
Potassium: 3.6 mmol/L (ref 3.5–5.1)
Sodium: 136 mmol/L (ref 135–145)

## 2023-12-02 LAB — CBC
HCT: 35.2 % — ABNORMAL LOW (ref 36.0–46.0)
Hemoglobin: 11.6 g/dL — ABNORMAL LOW (ref 12.0–15.0)
MCH: 32.1 pg (ref 26.0–34.0)
MCHC: 33 g/dL (ref 30.0–36.0)
MCV: 97.5 fL (ref 80.0–100.0)
Platelets: 105 10*3/uL — ABNORMAL LOW (ref 150–400)
RBC: 3.61 MIL/uL — ABNORMAL LOW (ref 3.87–5.11)
RDW: 14.3 % (ref 11.5–15.5)
WBC: 6.7 10*3/uL (ref 4.0–10.5)
nRBC: 0 % (ref 0.0–0.2)

## 2023-12-02 NOTE — Discharge Summary (Signed)
 Physician Discharge Summary  [name] FMW:[id number] DOB: Jan 18, 1928 DOA: 11/29/2023  PCP: Cynthia Roselie Rockford, NP  Admit date: 11/29/2023 Discharge date: 12/02/2023  Admitted From: Home Disposition: Home with home health  Recommendations for Outpatient Follow-up:  Follow up with PCP in 1-2 weeks Please obtain BMP/CBC in one week Please follow up on the following pending results:  Home Health: PT/OT Equipment/Devices: None  Discharge Condition: Stable CODE STATUS: DNR with limited intervention Diet recommendation: Regular diet, nutritional supplements  Discharge summary:  89 year old with history of essential hypertension, hyperlipidemia, chronic A-fib on amiodarone  but not on anticoagulation, chronic combined heart failure on multiple heart failure medications presented to the hospital with about 2 days of confusion.  Patient had recently developed dementia since last 6 months to a year.  Lives at home with family.  She was noted to be more confused than usual.  In the emergency room hemodynamically stable.  UA was abnormal, chest x-ray showed interstitial changes.  Patient was also found to have AKI with creatinine 1.9 and baseline creatinine of less than 1.  Presumed UTI, ruled out.  She received 3 days of IV Rocephin  today.  Urine cultures and blood cultures negative.  Discontinue further antibiotics. Presumed pneumonia, not likely.  Likely chronic changes on her x-ray.  AKI: Likely hemodynamic mediated.  On multiple antihypertensive medications and heart failure regimen.  She was treated with gentle IV fluids and levels are normalized.  She is going to resume all her medications.  Acute metabolic encephalopathy superimposed on Alzheimer's dementia: Mental status improving.  No focal deficits.  Lives at home with family.  History of coronary artery disease, dyslipidemia, chronic A-fib and chronic combined heart failure: Patient on multiple medications, Lasix , Aldactone , Coreg   and nitrates.  She is also on statins.  Daughter was wondering whether we can titrate her off some of the medications.  She has upcoming appointment with cardiology on 1/18.  Recommended to discuss with cardiology.  Given her age and developing dementia, she may benefit with laced medications.  Medically stable for discharge.  She has adequate support system at home.   Discharge Diagnoses:  Principal Problem:   Acute encephalopathy Active Problems:   Hyperlipidemia   Asthma   Atherosclerotic heart disease of native coronary artery without angina pectoris   GERD (gastroesophageal reflux disease)   Essential hypertension   Anemia   Chronic combined systolic and diastolic CHF (congestive heart failure) (HCC)   Coronary artery disease involving native coronary artery of native heart with angina pectoris (HCC)   PAF (paroxysmal atrial fibrillation) (HCC)   Memory impairment   Acute metabolic encephalopathy    Discharge Instructions  Discharge Instructions     Diet - low sodium heart healthy   Complete by: As directed    Increase activity slowly   Complete by: As directed    No wound care   Complete by: As directed       Allergies as of 12/02/2023       Reactions   Codeine Cough   Crestor  [rosuvastatin  Calcium ] Cough   Entresto  [sacubitril -valsartan ] Cough   Pravachol [pravastatin] Cough   Protonix [pantoprazole Sodium] Cough   Zestril [lisinopril] Cough   Zocor [simvastatin] Cough        Medication List     TAKE these medications    acetaminophen  325 MG tablet Commonly known as: TYLENOL  Take 2 tablets (650 mg total) by mouth every 6 (six) hours as needed for mild pain (or Fever >/= 101).  albuterol  108 (90 Base) MCG/ACT inhaler Commonly known as: VENTOLIN  HFA Inhale 1-2 puffs into the lungs every 6 (six) hours as needed for wheezing or shortness of breath.   amiodarone  200 MG tablet Commonly known as: PACERONE  TAKE ONE-HALF TABLET BY MOUTH  DAILY    carvedilol  3.125 MG tablet Commonly known as: COREG  TAKE 1 TABLET BY MOUTH TWICE  DAILY WITH A MEAL   clopidogrel  75 MG tablet Commonly known as: PLAVIX  TAKE 1 TABLET BY MOUTH DAILY  WITH BREAKFAST   ezetimibe  10 MG tablet Commonly known as: ZETIA  TAKE 1 TABLET BY MOUTH DAILY   furosemide  20 MG tablet Commonly known as: LASIX  TAKE 1 TABLET BY MOUTH 3 TIMES  WEEKLY What changed:  how much to take how to take this when to take this additional instructions   isosorbide  mononitrate 30 MG 24 hr tablet Commonly known as: IMDUR  TAKE 1 TABLET BY MOUTH DAILY   nitroGLYCERIN  0.4 MG SL tablet Commonly known as: NITROSTAT  DISSOLVE 1 TABLET UNDER THE  TONGUE EVERY 5 MINUTES AS NEEDED FOR CHEST PAIN. MAX OF 3 TABLETS IN 15 MINUTES. CALL 911 IF PAIN  PERSISTS.   PreserVision AREDS 2 Caps Take 1 capsule by mouth 2 (two) times daily.   Repatha  SureClick 140 MG/ML Soaj Generic drug: Evolocumab  INJECT 140MG  SUBCUTANEOUSLY  EVERY 2 WEEKS   spironolactone  25 MG tablet Commonly known as: ALDACTONE  TAKE 1 TABLET BY MOUTH DAILY        Follow-up Information     [facility] Follow up.   Specialty: Home Health Services Contact information: [address] [place] [address] [phone]                Allergies  Allergen Reactions   Codeine Cough   Crestor  [Rosuvastatin  Calcium ] Cough   Entresto  [Sacubitril -Valsartan ] Cough   Pravachol [Pravastatin] Cough   Protonix [Pantoprazole Sodium] Cough   Zestril [Lisinopril] Cough   Zocor [Simvastatin] Cough    Consultations: None   Procedures/Studies: CT Head Wo Contrast Result Date: 11/29/2023 CLINICAL DATA:  Mental status change EXAM: CT HEAD WITHOUT CONTRAST TECHNIQUE: Contiguous axial images were obtained from the base of the skull through the vertex without intravenous contrast. RADIATION DOSE REDUCTION: This exam was performed according to the departmental dose-optimization program which  includes automated exposure control, adjustment of the mA and/or kV according to patient size and/or use of iterative reconstruction technique. COMPARISON:  Head CT 12/11/2022.  MRI brain 09/08/2022. FINDINGS: Brain: No evidence of acute infarction, hemorrhage, hydrocephalus, extra-axial collection or mass lesion/mass effect. There is moderate diffuse atrophy. There is mild periventricular white matter hypodensity, likely chronic small vessel ischemic change. This is similar to prior study. Vascular: Atherosclerotic calcifications are present within the cavernous internal carotid arteries. There also atherosclerotic calcifications of the intracranial vertebral arteries. Skull: Normal. Negative for fracture or focal lesion. Sinuses/Orbits: No acute finding. Other: None. IMPRESSION: 1. No acute intracranial process. 2. Moderate diffuse atrophy and mild chronic small vessel ischemic change. Electronically Signed   By: Greig Pique M.D.   On: 11/29/2023 17:58   DG Chest 2 View Result Date: 11/29/2023 CLINICAL DATA:  Failure to thrive EXAM: CHEST - 2 VIEW COMPARISON:  11/30/2022, 10/25/2022, chest CT 10/25/2022 FINDINGS: Underlying chronic lung disease with mild fibrosis. Patchy right mid and upper lung slightly nodular opacities. Normal cardiac size. Aortic atherosclerosis. IMPRESSION: Underlying chronic lung disease with mild fibrosis. Patchy right mid and upper lung slightly nodular opacities, favor infectious or inflammatory, continued radiographic  follow-up to resolution is recommended. Electronically Signed   By: Luke Bun M.D.   On: 11/29/2023 15:48   (Echo, Carotid, EGD, Colonoscopy, ERCP)    Subjective: Patient seen in the morning rounds.  She was complaining of painful IV line otherwise denies any complaints.  Pleasantly confused.  Daughter at the bedside.  Patient is eager to go home.   Discharge Exam: Vitals:   12/02/23 0554 12/02/23 0744  BP: 128/65 (!) 125/58  Pulse: (!) 59 61  Resp: 16  18  Temp: 98.4 F (36.9 C)   SpO2: 100% 100%   Vitals:   12/01/23 1615 12/01/23 2049 12/02/23 0554 12/02/23 0744  BP: 116/74 (!) 142/64 128/65 (!) 125/58  Pulse: (!) 56 64 (!) 59 61  Resp: 17 18 16 18   Temp: 98 F (36.7 C) (!) 97.5 F (36.4 C) 98.4 F (36.9 C)   TempSrc:  Oral Oral   SpO2: 99% 97% 100% 100%  Weight:      Height:        General: Pt is alert, awake, not in acute distress Cardiovascular: RRR, S1/S2 +, no rubs, no gallops Respiratory: CTA bilaterally, no wheezing, no rhonchi Abdominal: Soft, NT, ND, bowel sounds + Extremities: no edema, no cyanosis Alert awake.  Oriented to her family.  Pleasant interaction.    The results of significant diagnostics from this hospitalization (including imaging, microbiology, ancillary and laboratory) are listed below for reference.     Microbiology: Recent Results (from the past 240 hours)  Urine Culture     Status: None   Collection Time: 11/29/23  4:00 PM   Specimen: Urine, Clean Catch  Result Value Ref Range Status   Specimen Description URINE, CLEAN CATCH  Final   Special Requests NONE  Final   Culture   Final    NO GROWTH Performed at [facility] Lab, [address], [place], [place] [address]    Report Status 11/30/2023 FINAL  Final  Respiratory (~20 pathogens) panel by PCR     Status: None   Collection Time: 11/30/23  7:16 AM   Specimen: Nasopharyngeal Swab; Respiratory  Result Value Ref Range Status   Adenovirus NOT DETECTED NOT DETECTED Final   Coronavirus 229E NOT DETECTED NOT DETECTED Final    Comment: (NOTE) The Coronavirus on the Respiratory Panel, DOES NOT test for the novel  Coronavirus (2019 nCoV)    Coronavirus HKU1 NOT DETECTED NOT DETECTED Final   Coronavirus NL63 NOT DETECTED NOT DETECTED Final   Coronavirus OC43 NOT DETECTED NOT DETECTED Final   Metapneumovirus NOT DETECTED NOT DETECTED Final   Rhinovirus / Enterovirus NOT DETECTED NOT DETECTED Final   Influenza A NOT DETECTED NOT  DETECTED Final   Influenza B NOT DETECTED NOT DETECTED Final   Parainfluenza Virus 1 NOT DETECTED NOT DETECTED Final   Parainfluenza Virus 2 NOT DETECTED NOT DETECTED Final   Parainfluenza Virus 3 NOT DETECTED NOT DETECTED Final   Parainfluenza Virus 4 NOT DETECTED NOT DETECTED Final   Respiratory Syncytial Virus NOT DETECTED NOT DETECTED Final   Bordetella pertussis NOT DETECTED NOT DETECTED Final   Bordetella Parapertussis NOT DETECTED NOT DETECTED Final   Chlamydophila pneumoniae NOT DETECTED NOT DETECTED Final   Mycoplasma pneumoniae NOT DETECTED NOT DETECTED Final    Comment: Performed at [facility], [address], [place], [place] [address]  SARS Coronavirus 2 by RT PCR (hospital order, performed in [facility] hospital lab) *cepheid single result test* Anterior Nasal Swab  Status: None   Collection Time: 11/30/23  7:16 AM   Specimen: Anterior Nasal Swab  Result Value Ref Range Status   SARS Coronavirus 2 by RT PCR NEGATIVE NEGATIVE Final    Comment: Performed at [facility], [address], [place], [place] [address]     Labs: BNP (last 3 results) No results for input(s): BNP in the last 8760 hours. Basic Metabolic Panel: Recent Labs  Lab 11/29/23 1433 11/29/23 1440 11/30/23 0646 12/02/23 0547  NA 135 135 136 136  K 3.8 3.8 3.3* 3.6  CL 97* 98 100 105  CO2 24  --  25 22  GLUCOSE 120* 117* 98 95  BUN 51* 55* 51* 27*  CREATININE 1.82* 1.90* 1.37* 0.92  CALCIUM  8.8*  --  8.5* 8.6*   Liver Function Tests: Recent Labs  Lab 11/29/23 1433 11/30/23 0646  AST 43* 29  ALT 19 19  ALKPHOS 59 53  BILITOT 1.4* 1.2  PROT 6.9 6.0*  ALBUMIN 3.0* 2.5*   No results for input(s): LIPASE, AMYLASE in the last 168 hours. No results for input(s): AMMONIA in the last 168 hours. CBC: Recent Labs  Lab 11/29/23 1433 11/29/23 1440 11/30/23 0646 12/02/23 0547  WBC 12.5*  --  7.9 6.7  NEUTROABS 9.7*  --   --   --   HGB 13.6 14.3 12.0 11.6*  HCT 41.7  42.0 37.0 35.2*  MCV 100.2*  --  99.2 97.5  PLT 74*  --  70* 105*   Cardiac Enzymes: No results for input(s): CKTOTAL, CKMB, CKMBINDEX, TROPONINI in the last 168 hours. BNP: Invalid input(s): POCBNP CBG: Recent Labs  Lab 11/29/23 1434  GLUCAP 102*   D-Dimer No results for input(s): DDIMER in the last 72 hours. Hgb A1c No results for input(s): HGBA1C in the last 72 hours. Lipid Profile No results for input(s): CHOL, HDL, LDLCALC, TRIG, CHOLHDL, LDLDIRECT in the last 72 hours. Thyroid  function studies No results for input(s): TSH, T4TOTAL, T3FREE, THYROIDAB in the last 72 hours.  Invalid input(s): FREET3 Anemia work up No results for input(s): VITAMINB12, FOLATE, FERRITIN, TIBC, IRON, RETICCTPCT in the last 72 hours. Urinalysis    Component Value Date/Time   COLORURINE AMBER (A) 11/29/2023 1532   APPEARANCEUR HAZY (A) 11/29/2023 1532   LABSPEC 1.018 11/29/2023 1532   PHURINE 5.0 11/29/2023 1532   GLUCOSEU NEGATIVE 11/29/2023 1532   HGBUR SMALL (A) 11/29/2023 1532   BILIRUBINUR NEGATIVE 11/29/2023 1532   BILIRUBINUR negative 01/03/2020 1100   KETONESUR NEGATIVE 11/29/2023 1532   PROTEINUR 30 (A) 11/29/2023 1532   UROBILINOGEN 1.0 01/03/2020 1100   UROBILINOGEN 1 12/22/2011 1442   NITRITE NEGATIVE 11/29/2023 1532   LEUKOCYTESUR NEGATIVE 11/29/2023 1532   Sepsis Labs Recent Labs  Lab 11/29/23 1433 11/30/23 0646 12/02/23 0547  WBC 12.5* 7.9 6.7   Microbiology Recent Results (from the past 240 hours)  Urine Culture     Status: None   Collection Time: 11/29/23  4:00 PM   Specimen: Urine, Clean Catch  Result Value Ref Range Status   Specimen Description URINE, CLEAN CATCH  Final   Special Requests NONE  Final   Culture   Final    NO GROWTH Performed at [facility], [address], [place], [place] [address]    Report Status 11/30/2023 FINAL  Final  Respiratory (~20 pathogens) panel by PCR     Status: None    Collection Time: 11/30/23  7:16 AM   Specimen: Nasopharyngeal Swab; Respiratory  Result Value Ref Range Status   Adenovirus NOT DETECTED NOT DETECTED Final   Coronavirus 229E NOT DETECTED NOT DETECTED Final    Comment: (NOTE) The Coronavirus on the Respiratory Panel, DOES NOT test for the novel  Coronavirus (2019 nCoV)    Coronavirus HKU1 NOT DETECTED NOT DETECTED Final   Coronavirus NL63 NOT DETECTED NOT DETECTED Final   Coronavirus OC43 NOT DETECTED NOT DETECTED Final   Metapneumovirus NOT DETECTED NOT DETECTED Final   Rhinovirus / Enterovirus NOT DETECTED NOT DETECTED Final   Influenza A NOT DETECTED NOT DETECTED Final   Influenza B NOT DETECTED NOT DETECTED Final   Parainfluenza Virus 1 NOT DETECTED NOT DETECTED Final   Parainfluenza Virus 2 NOT DETECTED NOT DETECTED Final   Parainfluenza Virus 3 NOT DETECTED NOT DETECTED Final   Parainfluenza Virus 4 NOT DETECTED NOT DETECTED Final   Respiratory Syncytial Virus NOT DETECTED NOT DETECTED Final   Bordetella pertussis NOT DETECTED NOT DETECTED Final   Bordetella Parapertussis NOT DETECTED NOT DETECTED Final   Chlamydophila pneumoniae NOT DETECTED NOT DETECTED Final   Mycoplasma pneumoniae NOT DETECTED NOT DETECTED Final    Comment: Performed at [facility], [address] [place], [place] [address]  SARS Coronavirus 2 by RT PCR (hospital order, performed in [facility] hospital lab) *cepheid single result test* Anterior Nasal Swab     Status: None   Collection Time: 11/30/23  7:16 AM   Specimen: Anterior Nasal Swab  Result Value Ref Range Status   SARS Coronavirus 2 by RT PCR NEGATIVE NEGATIVE Final    Comment: Performed at [facility], [address], [place], [place] [address]     Time coordinating discharge: 35 minutes  SIGNED:   Renato Applebaum, MD  [facility] 12/02/2023, 10:05 AM

## 2023-12-02 NOTE — TOC Progression Note (Signed)
 Transition of Care (TOC) - Progression Note   Notified Darleene with [employer] , discharge planned for today  Patient Details  Name: [name] MRN: [id number] Date of Birth: August 31, 1928  [facility]) CM/SW Contact  Carrington Olazabal, Powell Jansky, RN Phone Number: 12/02/2023, 10:24 AM  Clinical Narrative:       Expected Discharge Plan: Home w Home Health Services Barriers to Discharge: Continued Medical Work up  Expected Discharge Plan and Services   Discharge Planning Services: CM Consult Post Acute Care Choice: Home Health Living arrangements for the past 2 months: Single Family Home Expected Discharge Date: 12/02/23               DME Arranged: N/A DME Agency: NA       HH Arranged: PT, OT (see note)           Social Determinants of Health (SDOH) Interventions SDOH Screenings   Food Insecurity: No Food Insecurity (11/30/2023)  Housing: Low Risk  (11/30/2023)  Transportation Needs: No Transportation Needs (11/30/2023)  Utilities: Not At Risk (11/30/2023)  Alcohol Screen: Low Risk  (08/26/2022)  Depression (PHQ2-9): Medium Risk (01/11/2023)  Financial Resource Strain: Low Risk  (08/26/2022)  Social Connections: Unknown (11/30/2023)  Tobacco Use: Low Risk  (11/30/2023)    Readmission Risk Interventions     No data to display

## 2023-12-02 NOTE — Plan of Care (Signed)
  Problem: Clinical Measurements: Goal: Will remain free from infection Outcome: Progressing   Problem: Coping: Goal: Level of anxiety will decrease Outcome: Progressing   Problem: Pain Management: Goal: General experience of comfort will improve Outcome: Progressing   Problem: Safety: Goal: Ability to remain free from injury will improve Outcome: Progressing

## 2023-12-03 ENCOUNTER — Telehealth: Payer: Self-pay

## 2023-12-03 NOTE — Transitions of Care (Post Inpatient/ED Visit) (Signed)
 12/03/2023  Name: [name] MRN: [id number] DOB: [birth year]  Today's TOC FU Call Status: Today's TOC FU Call Status:: Successful TOC FU Call Completed TOC FU Call Complete Date: 12/03/23 Patient's Name and Date of Birth confirmed.  Transition Care Management Follow-up Telephone Call Date of Discharge: 12/03/23 Discharge Facility: [facility]) Type of Discharge: Inpatient Admission Primary Inpatient Discharge Diagnosis:: Dehydration How have you been since you were released from the hospital?: Better Any questions or concerns?: No  Items Reviewed: Did you receive and understand the discharge instructions provided?: Yes Medications obtained,verified, and reconciled?: Yes (Medications Reviewed) Any new allergies since your discharge?: No Dietary orders reviewed?: Yes Type of Diet Ordered:: Regular diet, low sodium, offer smaller size portion meals more often; increase fluid intake Do you have support at home?: Yes People in Home: child(ren), adult, grandchild(ren) Name of Support/Comfort Primary Source: Resides with dauhter - Large family network - very supportive  Medications Reviewed Today: Medications Reviewed Today     Reviewed by Gladis Sin, RN (Case Manager) on 12/03/23 at 1522  Med List Status: <None>   Medication Order Taking? Sig Documenting Provider Last Dose Status Informant  acetaminophen  (TYLENOL ) 325 MG tablet [id number] Yes Take 2 tablets (650 mg total) by mouth every 6 (six) hours as needed for mild pain (or Fever >/= 101). Samtani, Jai-Gurmukh, MD Taking Active Family Member, Pharmacy Records           Med Note (COFFELL, JON CHRISTELLA Kitchens Nov 29, 2023  5:54 PM) Unknown last dose, > 30 days.  albuterol  (VENTOLIN  HFA) 108 (90 Base) MCG/ACT inhaler [id number] Yes Inhale 1-2 puffs into the lungs every 6 (six) hours as needed for wheezing or shortness of breath. Nche, Roselie Rockford, NP Taking Active Family Member, Pharmacy Records           Med Note (COFFELL,  JON CHRISTELLA Kitchens Nov 29, 2023  5:55 PM) Unknown last dose, > 30 days.  amiodarone  (PACERONE ) 200 MG tablet [id number] Yes TAKE ONE-HALF TABLET BY MOUTH  DAILY Nahser, Aleene PARAS, MD Taking Active Family Member, Pharmacy Records  carvedilol  (COREG ) 3.125 MG tablet [id number] Yes TAKE 1 TABLET BY MOUTH TWICE  DAILY WITH A MEAL Nahser, Aleene PARAS, MD Taking Active Family Member, Pharmacy Records  clopidogrel  (PLAVIX ) 75 MG tablet [id number] Yes TAKE 1 TABLET BY MOUTH DAILY  WITH BREAKFAST Nche, Roselie Rockford, NP Taking Active Family Member, Pharmacy Records  Evolocumab  (REPATHA  SURECLICK) 140 MG/ML EMMANUEL [id number] Yes INJECT 140MG  SUBCUTANEOUSLY  EVERY 2 WEEKS Nahser, Aleene PARAS, MD Taking Active Family Member, Pharmacy Records  ezetimibe  (ZETIA ) 10 MG tablet [id number] Yes TAKE 1 TABLET BY MOUTH DAILY Nahser, Aleene PARAS, MD Taking Active Family Member, Pharmacy Records  furosemide  (LASIX ) 20 MG tablet [id number] Yes TAKE 1 TABLET BY MOUTH 3 TIMES  WEEKLY  Patient taking differently: Take 20 mg by mouth See admin instructions. Take 1 tablet (20mg ) every Monday, Wednesday, Friday. May take 1 tablet daily as needed for swelling on the other days.   Nahser, Aleene PARAS, MD Taking Active Family Member, Pharmacy Records  isosorbide  mononitrate (IMDUR ) 30 MG 24 hr tablet [id number] Yes TAKE 1 TABLET BY MOUTH DAILY Nahser, Aleene PARAS, MD Taking Active Family Member, Pharmacy Records  Multiple Vitamins-Minerals (PRESERVISION AREDS 2) CAPS [id number] Yes Take 1 capsule by mouth 2 (two) times daily. [provider] Taking Active Family Member, Pharmacy Records  nitroGLYCERIN  (NITROSTAT ) 0.4 MG SL tablet [id number] Yes DISSOLVE 1 TABLET UNDER THE  TONGUE  EVERY 5 MINUTES AS NEEDED FOR CHEST PAIN. MAX OF 3 TABLETS IN 15 MINUTES. CALL 911 IF PAIN  PERSISTS. Nahser, Aleene PARAS, MD Taking Active Family Member, Pharmacy Records           Med Note CARMIN, PASCO   Fri Dec 03, 2023  3:12 PM) As needed  spironolactone  (ALDACTONE ) 25 MG  tablet [id number] Yes TAKE 1 TABLET BY MOUTH DAILY Nahser, Aleene PARAS, MD Taking Active Family Member, Pharmacy Records            Home Care and Equipment/Supplies: Were Home Health Services Ordered?: Yes Name of Home Health Agency:: [facility] Has Agency set up a time to come to your home?: No EMR reviewed for Home Health Orders: Orders present/patient has not received call (refer to CM for follow-up) (RNCM placed call to [facility], [phone]) Any new equipment or medical supplies ordered?: No  Functional Questionnaire: Do you need assistance with bathing/showering or dressing?: Yes Do you need assistance with meal preparation?: Yes Do you need assistance with eating?: Yes Do you have difficulty maintaining continence: Yes Do you need assistance with getting out of bed/getting out of a chair/moving?: Yes Do you have difficulty managing or taking your medications?: Yes  Follow up appointments reviewed: PCP Follow-up appointment confirmed?: No (RNCM scheduled PCP appointment via care guide for 12/06/23 at 9 AM with Roselie Mood) MD Provider Line [phone] Given: No Specialist Hospital Follow-up appointment confirmed?: Yes Date of Specialist follow-up appointment?: 12/21/23 Follow-Up Specialty Provider:: Dr.Nasher, Cardiology Do you need transportation to your follow-up appointment?: No Do you understand care options if your condition(s) worsen?: Yes-patient verbalized understanding  SDOH Interventions Today    Flowsheet Row Most Recent Value  SDOH Interventions   Food Insecurity Interventions Intervention Not Indicated  Housing Interventions Intervention Not Indicated  Transportation Interventions Intervention Not Indicated  Utilities Interventions Intervention Not Indicated  Social Connections Interventions Intervention Not Indicated      Interventions Today    Flowsheet Row Most Recent Value  Chronic Disease   Chronic disease during today's  visit Other  [Dehydration, Failure To Thrive]  Exercise Interventions   Exercise Discussed/Reviewed Physical Activity  [Increase activity slowly as tolerated]  Education Interventions   Education Provided Provided Education  Provided Verbal Education On Nutrition, Labs, Medication, When to see the doctor, Insurance Plans  Nutrition Interventions   Nutrition Discussed/Reviewed Nutrition Discussed, Nutrition Reviewed, Fluid intake, Portion sizes, Supplemental nutrition  Pharmacy Interventions   Pharmacy Dicussed/Reviewed Medications and their functions  Safety Interventions   Safety Discussed/Reviewed Safety Discussed, Safety Reviewed, Fall Risk * Remove throw rugs, cords the patient may trip over, be sure sock have no skid soles when not wearing shoes and to ambulate with prescribed assistive device to help with balance and upright posture.  Advanced Directive Interventions   Advanced Directives Discussed/Reviewed Advanced Directives Reviewed, Advanced Directives Discussed  [Per daughter, patient has: DNR with limited intervention]       TOC Interventions Today    Flowsheet Row Most Recent Value  TOC Interventions   TOC Interventions Discussed/Reviewed TOC Interventions Discussed, TOC Interventions Reviewed, Arranged PCP follow up within 7 days/Care Guide scheduled, Contacted Home Health RN/OT/PT, Post discharge activity limitations per provider, S/S of infection      Outreach in real-time with scheduling care guide who successfully scheduled the patient a post-hospitalization PCP appointment with Roselie Mood, NP on 12/06/23 at 9 AM .  Patient/granddaughter, Gwendlyn,  verbalized understanding and is agreeable to appointment.   [facility] confirmed receipt of referral for the patient and will contact her by tomorrow to arrange appointment for start of care.  The majority of the [facility] assessment was completed with Ronal Neighbors, the patient's daughter and patient's primary caregiver.  The  patient was present as well for the telephone call today.  Patient and daughter declined 68 DAY TOC program at this time.  RNCM contact information provided should post-discharge needs, questions or concerns arise after call today.  Alfreda Hammad Gladis BSN, Programmer, Systems / Transitions of Care [facility] Direct Dial Number:  [phone]

## 2023-12-06 ENCOUNTER — Inpatient Hospital Stay: Payer: Medicare Other | Admitting: Nurse Practitioner

## 2023-12-13 ENCOUNTER — Telehealth: Payer: Self-pay | Admitting: Nurse Practitioner

## 2023-12-13 NOTE — Telephone Encounter (Signed)
Patient dropped off document Home Health Certificate (Order ID [id number]), to be filled out by provider. Patient requested to send it back via Call Patient to pick up within 7-days. Document is located in providers tray at front office.Please advise at Mobile [phone] (mobile)

## 2023-12-14 ENCOUNTER — Telehealth: Payer: Self-pay

## 2023-12-14 ENCOUNTER — Ambulatory Visit: Payer: Self-pay | Admitting: Nurse Practitioner

## 2023-12-14 NOTE — Telephone Encounter (Signed)
Copied from CRM [id number]. Topic: Clinical - Home Health Verbal Orders >> Dec 10, 2023  3:28 PM Kathryne Eriksson wrote: Caller/Agency: [facility] Callback Number: [phone] Service Requested: Nursing And Physical Therapy  Frequency: 2 x week for 3 weeks , 1 x week for 4 weeks  Any new concerns about the patient? Yes >> Dec 10, 2023  3:34 PM Kathryne Eriksson wrote: 1 level 2 medication interaction warning - Albuterol and Carvedilol  Open wound on her right buddock , no signs of infection.   [name] is manipulating her medication by not administering them.    Please review and advise.  Thanks. Dm/cma

## 2023-12-14 NOTE — Telephone Encounter (Signed)
Hospital follow up appointment scheduled for 12/29/2023 at 1:20pm

## 2023-12-14 NOTE — Telephone Encounter (Signed)
Copied from CRM [id number]. Topic: Clinical - Medication Question >> Dec 14, 2023  3:18 PM Orinda Kenner C wrote: Reason for CRM: Threasa Alpha PT from [facility] [phone] reports when she stands up full body pain 5 out of 10. Patient states she takes Ibuprofen for pain. Rosanne Ashing states they do not have medication on patient's list. Can patient have Ibuprofen and if so, please advise on dosage and sig. Please call Rosanne Ashing back.   Chief Complaint: PCP Call Symptoms: Pain Frequency: Since Discharge on the 9th of January Disposition: [] ED /[] Urgent Care (no appt availability in office) / [] Appointment(In office/virtual)/ []  [facility]/ [] Home Care/ [] Refused Recommended Disposition /[] [facility]/ [x]  Follow-up with PCP Additional Notes: Threasa Alpha from [facility] care states the patient has a popping sensation, and pain with standing. Saw the patient had Tylenol but no ibuprofen on the med list and was inquiring about the need and or allowance of the medication being taken.   Contact Threasa Alpha PT from [facility] [phone]  Reason for Disposition  [1] Follow-up call from patient regarding patient's clinical status AND [2] information urgent  Protocols used: PCP Call - No Triage-A-AH

## 2023-12-14 NOTE — Telephone Encounter (Signed)
Paperwork was obtained and place in review/sign provider folder.

## 2023-12-15 NOTE — Telephone Encounter (Signed)
Forms were faxed on 12/15/2023; I will also notify patient and leave copy at front desk for pick up.

## 2023-12-16 ENCOUNTER — Telehealth: Payer: Self-pay | Admitting: Nurse Practitioner

## 2023-12-16 NOTE — Telephone Encounter (Signed)
Paperwork obtained and placed in provider review/sign folder

## 2023-12-16 NOTE — Telephone Encounter (Signed)
Patient dropped off document Home Health Certificate (Order ID [id number]), to be filled out by provider. Patient requested to send it back via Fax within 7-days. Document is located in providers tray at front office.Please advise at Mobile [phone] (mobile)

## 2023-12-16 NOTE — Telephone Encounter (Signed)
Paperwork was filled out and faxed on 12/16/2023 with confirmation; charge sheet and copy placed in [place] office folder.

## 2023-12-17 ENCOUNTER — Telehealth: Payer: Self-pay | Admitting: Nurse Practitioner

## 2023-12-17 NOTE — Telephone Encounter (Signed)
Paperwork was obtained and placed in provider review/sign folder

## 2023-12-17 NOTE — Telephone Encounter (Signed)
Patient dropped off document Home Health Certificate (Order ID [id number]), to be filled out by provider. Patient requested to send it back via Fax within 7-days. Document is located in providers tray at front office.Please advise at Mobile [phone] (mobile)

## 2023-12-20 ENCOUNTER — Telehealth: Payer: Self-pay

## 2023-12-20 NOTE — Telephone Encounter (Signed)
Copied from CRM [id number]. Topic: General - Other >> Dec 20, 2023  2:08 PM Irine Seal wrote: Reason for CRM: [facility] [phone]   calling to leave a message for Nche, Bonna Gains, NP, patient is requesting early discharge from home health physical therapy effective 12/20/23. She does continue to have nursing involved.

## 2023-12-20 NOTE — Telephone Encounter (Signed)
Please advise, see below.

## 2023-12-21 ENCOUNTER — Encounter: Payer: Self-pay | Admitting: Cardiovascular Disease

## 2023-12-21 ENCOUNTER — Ambulatory Visit: Payer: Medicare Other | Attending: Cardiovascular Disease | Admitting: Cardiovascular Disease

## 2023-12-21 VITALS — BP 118/68 | HR 58 | Ht <= 58 in | Wt 123.0 lb

## 2023-12-21 DIAGNOSIS — I2510 Atherosclerotic heart disease of native coronary artery without angina pectoris: Secondary | ICD-10-CM | POA: Diagnosis not present

## 2023-12-21 DIAGNOSIS — E782 Mixed hyperlipidemia: Secondary | ICD-10-CM
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
## 2023-12-21 NOTE — Telephone Encounter (Signed)
Faxed on 12/21/2023 not 12/22/2023

## 2023-12-21 NOTE — Telephone Encounter (Signed)
Paperwork was faxed on 12/22/2023 and a copy place in [employer], museum/gallery.

## 2023-12-21 NOTE — Patient Instructions (Signed)
Medication Instructions:  Your physician recommends that you continue on your current medications as directed. Please refer to the Current Medication list given to you today.  *If you need a refill on your cardiac medications before your next appointment, please call your pharmacy*  Lab Work: If you have labs (blood work) drawn today and your tests are completely normal, you will receive your results only by: MyChart Message (if you have MyChart) OR A paper copy in the mail If you have any lab test that is abnormal or we need to change your treatment, we will call you to review the results.   Testing/Procedures: None ordered today.  Follow-Up: At [facility], you and your health needs are our priority.  As part of our continuing mission to provide you with exceptional heart care, we have created designated Provider Care Teams.  These Care Teams include your primary Cardiologist (physician) and Advanced Practice Providers (APPs -  Physician Assistants and Nurse Practitioners) who all work together to provide you with the care you need, when you need it.  We recommend signing up for the patient portal called "MyChart".  Sign up information is provided on this After Visit Summary.  MyChart is used to connect with patients for Virtual Visits (Telemedicine).  Patients are able to view lab/test results, encounter notes, upcoming appointments, etc.  Non-urgent messages can be sent to your provider as well.   To learn more about what you can do with MyChart, go to [email or web address].    Your next appointment:   1 year(s)  Provider:   [name], MD     Other Instructions

## 2023-12-28 ENCOUNTER — Other Ambulatory Visit: Payer: Self-pay | Admitting: Nurse Practitioner

## 2023-12-29 ENCOUNTER — Telehealth: Payer: Self-pay | Admitting: Nurse Practitioner

## 2023-12-29 ENCOUNTER — Inpatient Hospital Stay: Payer: Medicare Other | Admitting: Nurse Practitioner

## 2023-12-29 NOTE — Telephone Encounter (Signed)
 12/29/2023 no show for hosp f/u, 1st missed visit, pt scheduled for f/u 01/12/2024, does she need to be seen sooner?

## 2023-12-30 ENCOUNTER — Other Ambulatory Visit: Payer: Self-pay | Admitting: Cardiovascular Disease

## 2024-01-03 ENCOUNTER — Telehealth: Payer: Self-pay | Admitting: Nurse Practitioner

## 2024-01-03 NOTE — Telephone Encounter (Signed)
 I called and spoke with [name] and notified her that forms were faxed back.

## 2024-01-03 NOTE — Telephone Encounter (Signed)
   CLINICAL USE BELOW THIS LINE (use X to signify action taken)  _X__ Form received and placed in providers office for signature. ___ Form completed and faxed to LOA Dept.  ___ Form completed & LVM to notify patient ready for pick up.  ___ Charge sheet and copy of form in front office folder for office supervisor.

## 2024-01-03 NOTE — Telephone Encounter (Signed)
 Patient dropped off document Home Health Certificate (Order ID [id number]), to be filled out by provider. Patient requested to send it back via Fax within 7-days. Document is located in providers tray at front office.Please advise at  [phone]

## 2024-01-09 ENCOUNTER — Other Ambulatory Visit: Payer: Self-pay | Admitting: Cardiovascular Disease

## 2024-01-12 ENCOUNTER — Encounter: Payer: Self-pay | Admitting: Nurse Practitioner

## 2024-01-12 ENCOUNTER — Telehealth: Payer: Medicare Other | Admitting: Nurse Practitioner

## 2024-01-12 VITALS — BP 117/75 | HR 59

## 2024-01-12 DIAGNOSIS — N1831 Chronic kidney disease, stage 3a: Secondary | ICD-10-CM

## 2024-01-12 DIAGNOSIS — E559 Vitamin D deficiency, unspecified: Secondary | ICD-10-CM

## 2024-01-12 DIAGNOSIS — R7303 Prediabetes: Secondary | ICD-10-CM | POA: Diagnosis not present

## 2024-01-12 DIAGNOSIS — E782 Mixed hyperlipidemia: Secondary | ICD-10-CM | POA: Diagnosis not present

## 2024-01-12 DIAGNOSIS — N189 Chronic kidney disease, unspecified: Secondary | ICD-10-CM

## 2024-01-12 NOTE — Assessment & Plan Note (Addendum)
Positive urine protein, low calcium, normal electrolytes No LE edema Current use of furosemide 20mg  3x/week  Check UACr, PTH, vit. D, and repeat CMP.

## 2024-01-12 NOTE — Assessment & Plan Note (Signed)
Current use of zetia and repatha. Under the care of cardiology. Repeat lipid panel

## 2024-01-12 NOTE — Assessment & Plan Note (Signed)
 Repeat hgbA1c

## 2024-01-12 NOTE — Progress Notes (Signed)
Virtual Visit via Video Note  I connected with [name] on 01/12/24 at  1:00 PM EST by a video enabled telemedicine application and verified that I am speaking with the correct person using two identifiers.  Location: Patient:Home Provider: Office Participants: patient, daughter and provider  I discussed the limitations of evaluation and management by telemedicine and the availability of in person appointments. I also discussed with the patient that there may be a patient responsible charge related to this service. The patient expressed understanding and agreed to proceed.  WG:NFAOZHY care management  History of Present Illness:  Prediabetes Repeat hgbA1c  Hyperlipidemia Current use of zetia and repatha. Under the care of cardiology. Repeat lipid panel  Stage 3a chronic kidney disease (HCC) Positive urine protein, low calcium, normal electrolytes No LE edema Current use of furosemide 20mg  3x/week  Check UACr, PTH, vit. D, and repeat CMP.   Observations/Objective: Physical Exam Vitals reviewed.  Cardiovascular:     Pulses: Normal pulses.  Pulmonary:     Effort: Pulmonary effort is normal.  Neurological:     Mental Status: She is alert and oriented to person, place, and time.     Assessment and Plan: [name] was seen today for follow-up, hypertension, hyperlipidemia and prediabetes.  Diagnoses and all orders for this visit:  Stage 3a chronic kidney disease (HCC) -     Comprehensive metabolic panel; Future -     CBC; Future -     PTH, intact (no Ca); Future -     VITAMIN D 25 Hydroxy (Vit-D Deficiency, Fractures); Future -     Microalbumin / creatinine urine ratio  Mixed hyperlipidemia -     Lipid panel; Future -     Comprehensive metabolic panel; Future  Prediabetes -     Hemoglobin A1c; Future -     Comprehensive metabolic panel; Future  Vitamin D deficiency  Hypocalcemia due to chronic kidney disease -     PTH, intact (no Ca); Future -     VITAMIN  D 25 Hydroxy (Vit-D Deficiency, Fractures); Future   Follow Up Instructions: Scheduled lab appointment on 01/17/2024 at 2pm F/up with me in 6months   I discussed the assessment and treatment plan with the patient. The patient was provided an opportunity to ask questions and all were answered. The patient agreed with the plan and demonstrated an understanding of the instructions.   The patient was advised to call back or seek an in-person evaluation if the symptoms worsen or if the condition fails to improve as anticipated.  Cynthia Penna, NP

## 2024-01-17 ENCOUNTER — Other Ambulatory Visit: Payer: Medicare Other

## 2024-01-24 ENCOUNTER — Other Ambulatory Visit: Payer: Self-pay | Admitting: Cardiovascular Disease

## 2024-01-27 ENCOUNTER — Other Ambulatory Visit: Payer: Self-pay | Admitting: Cardiovascular Disease

## 2024-02-03 ENCOUNTER — Other Ambulatory Visit: Payer: Self-pay | Admitting: Cardiovascular Disease

## 2024-02-03 DIAGNOSIS — E860 Dehydration: Secondary | ICD-10-CM

## 2024-03-03 ENCOUNTER — Other Ambulatory Visit: Payer: Self-pay | Admitting: Cardiovascular Disease

## 2024-03-03 DIAGNOSIS — E7800 Pure hypercholesterolemia, unspecified: Secondary | ICD-10-CM

## 2024-03-03 DIAGNOSIS — I25811 Atherosclerosis of native coronary artery of transplanted heart without angina pectoris: Secondary | ICD-10-CM

## 2024-04-15 ENCOUNTER — Other Ambulatory Visit: Payer: Self-pay | Admitting: Nurse Practitioner

## 2024-04-18 NOTE — Telephone Encounter (Signed)
 Medication: Clopidgrel (Plavix ) 75 mg  Directions: Take 1 tablet by mouth daily  Last given: 10/11/23 Number refills: 1 Last o/v: 01/12/24 (Video),  Follow up: 6 months-NOT scheduled  Labs: 12/02/23 [facility])

## 2024-04-21 ENCOUNTER — Other Ambulatory Visit: Payer: Self-pay | Admitting: Cardiovascular Disease

## 2024-05-29 ENCOUNTER — Other Ambulatory Visit: Payer: Self-pay | Admitting: Nurse Practitioner

## 2024-07-17 ENCOUNTER — Ambulatory Visit: Payer: Self-pay | Admitting: *Deleted

## 2024-07-17 NOTE — Telephone Encounter (Signed)
 Copied from CRM [id number]. Topic: Clinical - Red Word Triage >> Jul 17, 2024 12:09 PM Drema MATSU wrote: Red Word that prompted transfer to Nurse Triage: Patient has blood in her urine. Reason for Disposition  Blood in urine  (Exception: Could be normal menstrual bleeding.)  Answer Assessment - Initial Assessment Questions 1. COLOR of URINE: Describe the color of the urine.  (e.g., tea-colored, pink, red, bloody) Do you have blood clots in your urine? (e.g., none, pea, grape, small coin)     The past 3 days she has been seeing blood in urine but not today.   Denies burning with urination.     2. ONSET: When did the bleeding start?      3 days ago 3. EPISODES: How many times has there been blood in the urine? or How many times today?     The past 3 days but none today. 4. PAIN with URINATION: Is there any pain with passing your urine? If Yes, ask: How bad is the pain?  (Scale 1-10; or mild, moderate, severe)     No 5. FEVER: Do you have a fever? If Yes, ask: What is your temperature, how was it measured, and when did it start?     No 6. ASSOCIATED SYMPTOMS: Are you passing urine more frequently than usual?     No 7. OTHER SYMPTOMS: Do you have any other symptoms? (e.g., back/flank pain, abdomen pain, vomiting)     No other symptoms 8. PREGNANCY: Is there any chance you are pregnant? When was your last menstrual period?     N/A  Protocols used: Urine - Blood In-A-AH FYI Only or Action Required?: FYI only for provider.  Patient was last seen in primary care on 01/12/2024 by Nche, Roselie Rockford, NP.  Called Nurse Triage reporting Hematuria. For past 3 days but not today 8/25.   Symptoms began several days ago. 3 days ago   Denies any other symptoms.   Just the blood in her urine.  Interventions attempted: Nothing.  Symptoms are: unchanged.  Triage Disposition: See Physician Within 24 Hours  Patient/caregiver understands and will follow disposition?: Yes

## 2024-07-17 NOTE — Telephone Encounter (Signed)
 Called and left VM to see if patient wanted to come in today to see Corean Geralds. Dm/cma

## 2024-07-18 ENCOUNTER — Encounter: Payer: Self-pay | Admitting: Nurse Practitioner

## 2024-07-18 ENCOUNTER — Ambulatory Visit (INDEPENDENT_AMBULATORY_CARE_PROVIDER_SITE_OTHER): Admitting: Nurse Practitioner

## 2024-07-18 VITALS — BP 114/64 | HR 62 | Temp 97.2°F | Ht 60.0 in | Wt 122.2 lb

## 2024-07-18 DIAGNOSIS — N3001 Acute cystitis with hematuria: Secondary | ICD-10-CM | POA: Diagnosis not present

## 2024-07-18 DIAGNOSIS — N1831 Chronic kidney disease, stage 3a: Secondary | ICD-10-CM

## 2024-07-18 DIAGNOSIS — R350 Frequency of micturition: Secondary | ICD-10-CM | POA: Diagnosis not present

## 2024-07-18 DIAGNOSIS — N939 Abnormal uterine and vaginal bleeding, unspecified: Secondary | ICD-10-CM | POA: Diagnosis not present

## 2024-07-18 DIAGNOSIS — R7303 Prediabetes: Secondary | ICD-10-CM | POA: Diagnosis not present

## 2024-07-18 DIAGNOSIS — I10 Essential (primary) hypertension: Secondary | ICD-10-CM

## 2024-07-18 DIAGNOSIS — E782 Mixed hyperlipidemia: Secondary | ICD-10-CM | POA: Diagnosis not present

## 2024-07-18 LAB — COMPREHENSIVE METABOLIC PANEL WITH GFR
ALT: 9 U/L (ref 0–35)
AST: 18 U/L (ref 0–37)
Albumin: 3.8 g/dL (ref 3.5–5.2)
Alkaline Phosphatase: 65 U/L (ref 39–117)
BUN: 17 mg/dL (ref 6–23)
CO2: 28 meq/L (ref 19–32)
Calcium: 9 mg/dL (ref 8.4–10.5)
Chloride: 99 meq/L (ref 96–112)
Creatinine, Ser: 1.09 mg/dL (ref 0.40–1.20)
GFR: 43.47 mL/min — ABNORMAL LOW (ref >60.00)
Glucose, Bld: 101 mg/dL — ABNORMAL HIGH (ref 70–99)
Potassium: 3.7 meq/L (ref 3.5–5.1)
Sodium: 137 meq/L (ref 135–145)
Total Bilirubin: 0.5 mg/dL (ref 0.2–1.2)
Total Protein: 7.2 g/dL (ref 6.0–8.3)

## 2024-07-18 LAB — CBC WITH DIFFERENTIAL/PLATELET
Basophils Absolute: 0 K/uL (ref 0.0–0.1)
Basophils Relative: 0.7 % (ref 0.0–3.0)
Eosinophils Absolute: 0.2 K/uL (ref 0.0–0.7)
Eosinophils Relative: 2.4 % (ref 0.0–5.0)
HCT: 40.8 % (ref 36.0–46.0)
Hemoglobin: 13.6 g/dL (ref 12.0–15.0)
Lymphocytes Relative: 29.1 % (ref 12.0–46.0)
Lymphs Abs: 1.8 K/uL (ref 0.7–4.0)
MCHC: 33.3 g/dL (ref 30.0–36.0)
MCV: 95.4 fl (ref 78.0–100.0)
Monocytes Absolute: 0.6 K/uL (ref 0.1–1.0)
Monocytes Relative: 9.2 % (ref 3.0–12.0)
Neutro Abs: 3.7 K/uL (ref 1.4–7.7)
Neutrophils Relative %: 58.6 % (ref 43.0–77.0)
Platelets: 253 K/uL (ref 150.0–400.0)
RBC: 4.28 Mil/uL (ref 3.87–5.11)
RDW: 14.1 % (ref 11.5–15.5)
WBC: 6.3 K/uL (ref 4.0–10.5)

## 2024-07-18 LAB — HEMOGLOBIN A1C: Hgb A1c MFr Bld: 6.2 % (ref 4.6–6.5)

## 2024-07-18 LAB — TSH: TSH: 15.7 u[IU]/mL — ABNORMAL HIGH (ref 0.35–5.50)

## 2024-07-18 NOTE — Patient Instructions (Signed)
 Go to lab You will be contacted to schedule appointment for pelvic US . Maintain Heart healthy diet and daily exercise. Maintain current medications.

## 2024-07-18 NOTE — Progress Notes (Unsigned)
 Acute Office Visit  Subjective:    Patient ID: [name], female    DOB: 05-Aug-1928, 89 y.o.   MRN: [id number]  Chief Complaint  Patient presents with   Hematuria    Starting Sunday frequent urge to urinate no abd pain    Accompanied by daughter.  Patient is in today for possible hematuria vs vaginal bleed? Onset 3days ago, she denies any pain or fever or dizziness or chills or dysuria Current use of plavix  due to paroxysmal A-fib.  Outpatient Medications Prior to Visit  Medication Sig   amiodarone  (PACERONE ) 200 MG tablet TAKE ONE-HALF TABLET BY MOUTH  DAILY   carvedilol  (COREG ) 3.125 MG tablet TAKE 1 TABLET BY MOUTH TWICE  DAILY WITH MEALS (Patient taking differently: Take 3.125 mg by mouth daily.)   clopidogrel  (PLAVIX ) 75 MG tablet TAKE 1 TABLET BY MOUTH DAILY  WITH BREAKFAST   Evolocumab  (REPATHA  SURECLICK) 140 MG/ML SOAJ INJECT 1 PEN SUBCUTANEOUSLY  EVERY 2 WEEKS   furosemide  (LASIX ) 20 MG tablet TAKE 1 TABLET BY MOUTH 3 TIMES  WEEKLY   nitroGLYCERIN  (NITROSTAT ) 0.4 MG SL tablet DISSOLVE 1 TABLET UNDER THE  TONGUE EVERY 5 MINUTES AS NEEDED FOR CHEST PAIN. MAX OF 3 TABLETS IN 15 MINUTES. CALL 911 IF PAIN  PERSISTS.   spironolactone  (ALDACTONE ) 25 MG tablet TAKE 1 TABLET BY MOUTH DAILY   No facility-administered medications prior to visit.    Reviewed past medical and social history.  Review of Systems  Genitourinary:  Positive for hematuria.   Per HPI     Objective:    Physical Exam Vitals and nursing note reviewed. Exam conducted with a chaperone present.  Constitutional:      General: She is not in acute distress.    Appearance: She is not ill-appearing.  Pulmonary:     Effort: Pulmonary effort is normal.  Abdominal:     General: There is no distension.     Palpations: Abdomen is soft.     Tenderness: There is no abdominal tenderness. There is no right CVA tenderness, left CVA tenderness or guarding.  Genitourinary:    General: Normal vulva.     Exam  position: Knee-chest position.     Urethra: No urethral pain or urethral swelling.     Vagina: Bleeding present.  Neurological:     Mental Status: She is alert.     Comments: Oriented to person and place    BP 114/64 (BP Location: Left Arm, Patient Position: Sitting, Cuff Size: Normal)   Pulse 62   Temp (!) 97.2 F (36.2 C) (Oral)   Ht 5' (1.524 m)   Wt 122 lb 3.2 oz (55.4 kg)   SpO2 95%   BMI 23.87 kg/m  {Vitals History (Optional):23777}  Results for orders placed or performed in visit on 07/18/24  CBC with Differential/Platelet  Result Value Ref Range   WBC 6.3 4.0 - 10.5 K/uL   RBC 4.28 3.87 - 5.11 Mil/uL   Hemoglobin 13.6 12.0 - 15.0 g/dL   HCT 59.1 63.9 - 53.9 %   MCV 95.4 78.0 - 100.0 fl   MCHC 33.3 30.0 - 36.0 g/dL   RDW 85.8 88.4 - 84.4 %   Platelets 253.0 150.0 - 400.0 K/uL   Neutrophils Relative % 58.6 43.0 - 77.0 %   Lymphocytes Relative 29.1 12.0 - 46.0 %   Monocytes Relative 9.2 3.0 - 12.0 %   Eosinophils Relative 2.4 0.0 - 5.0 %   Basophils Relative 0.7 0.0 - 3.0 %  Neutro Abs 3.7 1.4 - 7.7 K/uL   Lymphs Abs 1.8 0.7 - 4.0 K/uL   Monocytes Absolute 0.6 0.1 - 1.0 K/uL   Eosinophils Absolute 0.2 0.0 - 0.7 K/uL   Basophils Absolute 0.0 0.0 - 0.1 K/uL  Comprehensive metabolic panel with GFR  Result Value Ref Range   Sodium 137 135 - 145 mEq/L   Potassium 3.7 3.5 - 5.1 mEq/L   Chloride 99 96 - 112 mEq/L   CO2 28 19 - 32 mEq/L   Glucose, Bld 101 (H) 70 - 99 mg/dL   BUN 17 6 - 23 mg/dL   Creatinine, Ser 8.90 0.40 - 1.20 mg/dL   Total Bilirubin 0.5 0.2 - 1.2 mg/dL   Alkaline Phosphatase 65 39 - 117 U/L   AST 18 0 - 37 U/L   ALT 9 0 - 35 U/L   Total Protein 7.2 6.0 - 8.3 g/dL   Albumin 3.8 3.5 - 5.2 g/dL   GFR 56.52 (L) >39.99 mL/min   Calcium  9.0 8.4 - 10.5 mg/dL  TSH  Result Value Ref Range   TSH 15.70 (H) 0.35 - 5.50 uIU/mL  Hemoglobin A1c  Result Value Ref Range   Hgb A1c MFr Bld 6.2 4.6 - 6.5 %  PTH, intact (no Ca)  Result Value Ref Range    PTH 48 16 - 77 pg/mL       Assessment & Plan:   Problem List Items Addressed This Visit     Essential hypertension   Relevant Orders   TSH (Completed)   Hyperlipidemia   Relevant Orders   Comprehensive metabolic panel with GFR (Completed)   Prediabetes   Relevant Orders   Comprehensive metabolic panel with GFR (Completed)   Hemoglobin A1c (Completed)   Stage 3a chronic kidney disease (HCC)   Relevant Orders   Comprehensive metabolic panel with GFR (Completed)   PTH, intact (no Ca) (Completed)   Other Visit Diagnoses       Urinary frequency    -  Primary   Relevant Orders   POCT urinalysis dipstick     Vaginal bleeding       Relevant Orders   CBC with Differential/Platelet (Completed)   US  Pelvis Complete      No orders of the defined types were placed in this encounter.  Return if symptoms worsen or fail to improve.    Roselie Mood, NP

## 2024-07-19 ENCOUNTER — Ambulatory Visit: Payer: Self-pay | Admitting: Nurse Practitioner

## 2024-07-19 ENCOUNTER — Ambulatory Visit
Admission: RE | Admit: 2024-07-19 | Discharge: 2024-07-19 | Disposition: A | Discharge status: Home / Self Care | Source: Ambulatory Visit | Attending: Nurse Practitioner | Admitting: Nurse Practitioner

## 2024-07-19 DIAGNOSIS — N3001 Acute cystitis with hematuria: Secondary | ICD-10-CM

## 2024-07-19 DIAGNOSIS — N939 Abnormal uterine and vaginal bleeding, unspecified: Secondary | ICD-10-CM

## 2024-07-19 LAB — POCT URINALYSIS DIPSTICK
Glucose, UA: NEGATIVE
Ketones, UA: NEGATIVE
Nitrite, UA: NEGATIVE
Protein, UA: POSITIVE — AB
Spec Grav, UA: 1.02 (ref 1.010–1.025)
Urobilinogen, UA: 1 U/dL
pH, UA: 6 (ref 5.0–8.0)

## 2024-07-19 LAB — PARATHYROID HORMONE, INTACT (NO CA): PTH: 48 pg/mL (ref 16–77)

## 2024-07-20 DIAGNOSIS — N3001 Acute cystitis with hematuria: Secondary | ICD-10-CM | POA: Diagnosis not present

## 2024-07-20 MED ORDER — CEFDINIR 300 MG PO CAPS: 300.0000 mg | ORAL_CAPSULE | Freq: Two times a day (BID) | ORAL | 0 refills | Status: DC

## 2024-07-20 NOTE — Telephone Encounter (Signed)
 Called mobile number listed for mary Jenkins Neighbors and left a voice message per DPR asking to give me a call back

## 2024-07-20 NOTE — Telephone Encounter (Signed)
-----   Message from [place] sent at 07/20/2024  9:13 AM EDT ----- Normal pelvic US  Schedule 1week f/up appointment to re eval bleeding ----- Message ----- From: Interface, Rad Results In Sent: 07/19/2024   5:15 PM EDT To: Roselie Bishop Mood, NP

## 2024-07-20 NOTE — Telephone Encounter (Signed)
 Tried calling patient on the number listed for home number and it just rings. Call the number listed for mobile and was told to call Ronal Jenkins Neighbors her daughter on the mobile number.

## 2024-07-21 ENCOUNTER — Encounter: Payer: Self-pay | Admitting: Nurse Practitioner

## 2024-07-22 LAB — URINE CULTURE
MICRO NUMBER:: 16896718
SPECIMEN QUALITY:: ADEQUATE

## 2024-07-27 NOTE — Telephone Encounter (Addendum)
 Called and left a voice message per DPR on file asking to give me a call back at the office at [phone].  ----- Message from Roselie Mood sent at 07/25/2024 12:03 PM EDT ----- Positive E. Coli growth which is sensitive to cefdinir  prescribed. Complete oral antibiotics as prescribed. Ask if bleeding has resolved or not? Needs a f/up appointment with me this week of next week. ----- Message ----- From: Interface, Lab In Three Zero One Sent: 07/18/2024   4:22 PM EDT To: Roselie Bishop Mood, NP

## 2024-09-01 ENCOUNTER — Encounter: Payer: Self-pay | Admitting: Nurse Practitioner

## 2024-09-01 ENCOUNTER — Ambulatory Visit: Payer: Self-pay | Admitting: Nurse Practitioner

## 2024-09-01 ENCOUNTER — Ambulatory Visit

## 2024-09-01 ENCOUNTER — Ambulatory Visit (INDEPENDENT_AMBULATORY_CARE_PROVIDER_SITE_OTHER): Admitting: Nurse Practitioner

## 2024-09-01 VITALS — BP 110/60 | HR 60 | Temp 98.1°F | Ht <= 58 in | Wt 119.4 lb

## 2024-09-01 DIAGNOSIS — J209 Acute bronchitis, unspecified: Secondary | ICD-10-CM

## 2024-09-01 DIAGNOSIS — R053 Chronic cough: Secondary | ICD-10-CM | POA: Diagnosis not present

## 2024-09-01 DIAGNOSIS — R918 Other nonspecific abnormal finding of lung field: Secondary | ICD-10-CM | POA: Diagnosis not present

## 2024-09-01 DIAGNOSIS — I69954 Hemiplegia and hemiparesis following unspecified cerebrovascular disease affecting left non-dominant side: Secondary | ICD-10-CM | POA: Insufficient documentation

## 2024-09-01 DIAGNOSIS — R911 Solitary pulmonary nodule: Secondary | ICD-10-CM | POA: Diagnosis not present

## 2024-09-01 DIAGNOSIS — I679 Cerebrovascular disease, unspecified: Secondary | ICD-10-CM | POA: Insufficient documentation

## 2024-09-01 DIAGNOSIS — Z8673 Personal history of transient ischemic attack (TIA), and cerebral infarction without residual deficits: Secondary | ICD-10-CM | POA: Insufficient documentation

## 2024-09-01 DIAGNOSIS — J189 Pneumonia, unspecified organism: Secondary | ICD-10-CM

## 2024-09-01 MED ORDER — ALBUTEROL SULFATE HFA 108 (90 BASE) MCG/ACT IN AERS: 2.0000 | INHALATION_SPRAY | Freq: Four times a day (QID) | RESPIRATORY_TRACT | 0 refills | Status: AC | PRN

## 2024-09-01 MED ORDER — DM-GUAIFENESIN ER 30-600 MG PO TB12: 1.0000 | ORAL_TABLET | Freq: Two times a day (BID) | ORAL | Status: AC | PRN

## 2024-09-01 MED ORDER — SPACER/AERO-HOLDING CHAMBERS DEVI: 1.0000 [IU] | 0 refills | Status: AC | PRN

## 2024-09-01 MED ORDER — DOXYCYCLINE HYCLATE 100 MG PO TABS: 100.0000 mg | ORAL_TABLET | Freq: Two times a day (BID) | ORAL | 0 refills | Status: AC

## 2024-09-01 NOTE — Progress Notes (Signed)
 Established Patient Visit  Patient: [name]   DOB: 01/27/1928   89 y.o. Female  MRN: [id number] Visit Date: 09/01/2024  Subjective:    Chief Complaint  Patient presents with   Cough    For 1 week has had a dry cough worse at night, itchy watery eyes    Accompanied by daughter  Cough This is a new problem. The current episode started in the past 7 days. The problem has been unchanged. The cough is Non-productive. Associated symptoms include postnasal drip and wheezing. Pertinent negatives include no chest pain, chills, ear congestion, ear pain, fever, headaches, heartburn, hemoptysis, myalgias, nasal congestion, rash, rhinorrhea, sore throat, shortness of breath, sweats or weight loss. The symptoms are aggravated by lying down. She has tried nothing for the symptoms. Her past medical history is significant for asthma and environmental allergies. There is no history of bronchiectasis, bronchitis, COPD, emphysema or pneumonia.  Completed course of cefdinir  2months ago for UTI.  Wt Readings from Last 3 Encounters:  09/01/24 119 lb 6.4 oz (54.2 kg)  07/18/24 122 lb 3.2 oz (55.4 kg)  12/21/23 123 lb (55.8 kg)    BP Readings from Last 3 Encounters:  09/01/24 110/60  07/18/24 114/64  01/12/24 117/75    Reviewed medical, surgical, and social history today  Medications: Outpatient Medications Prior to Visit  Medication Sig   amiodarone  (PACERONE ) 200 MG tablet TAKE ONE-HALF TABLET BY MOUTH  DAILY   carvedilol  (COREG ) 3.125 MG tablet TAKE 1 TABLET BY MOUTH TWICE  DAILY WITH MEALS (Patient taking differently: Take 3.125 mg by mouth daily.)   cefdinir  (OMNICEF ) 300 MG capsule Take 1 capsule (300 mg total) by mouth 2 (two) times daily.   clopidogrel  (PLAVIX ) 75 MG tablet TAKE 1 TABLET BY MOUTH DAILY  WITH BREAKFAST   Evolocumab  (REPATHA  SURECLICK) 140 MG/ML SOAJ INJECT 1 PEN SUBCUTANEOUSLY  EVERY 2 WEEKS   furosemide  (LASIX ) 20 MG tablet TAKE 1 TABLET BY MOUTH 3 TIMES   WEEKLY   nitroGLYCERIN  (NITROSTAT ) 0.4 MG SL tablet DISSOLVE 1 TABLET UNDER THE  TONGUE EVERY 5 MINUTES AS NEEDED FOR CHEST PAIN. MAX OF 3 TABLETS IN 15 MINUTES. CALL 911 IF PAIN  PERSISTS.   spironolactone  (ALDACTONE ) 25 MG tablet TAKE 1 TABLET BY MOUTH DAILY   No facility-administered medications prior to visit.   Reviewed past medical and social history.   ROS per HPI above      Objective:  BP 110/60 (BP Location: Right Arm, Patient Position: Sitting, Cuff Size: Normal)   Pulse 60   Temp 98.1 F (36.7 C) (Oral)   Ht 4' 10 (1.473 m)   Wt 119 lb 6.4 oz (54.2 kg)   SpO2 96%   BMI 24.95 kg/m      Physical Exam Vitals and nursing note reviewed.  Cardiovascular:     Rate and Rhythm: Normal rate and regular rhythm.     Pulses: Normal pulses.     Heart sounds: Normal heart sounds.  Pulmonary:     Effort: Pulmonary effort is normal. No respiratory distress.     Breath sounds: No stridor. Wheezing present. No rhonchi or rales.  Chest:     Chest wall: No tenderness.  Musculoskeletal:     Right lower leg: No edema.     Left lower leg: No edema.  Neurological:     Mental Status: She is alert and oriented to person, place, and time.  No results found for any visits on 09/01/24.    Assessment & Plan:    Problem List Items Addressed This Visit   None Visit Diagnoses       Acute bronchitis, unspecified organism    -  Primary   Relevant Medications   dextromethorphan-guaiFENesin  (MUCINEX  DM) 30-600 MG 12hr tablet   albuterol  (VENTOLIN  HFA) 108 (90 Base) MCG/ACT inhaler   Spacer/Aero-Holding Chambers DEVI   Other Relevant Orders   DG Chest 2 View     Start albuterol  and robitussin Dm or mucinex  Dm for cough Maintain adequate oral hydration. Use space with albuterol  inhaler Consider addling oral prednisone  if normal CXR and cough persistent.  Return if symptoms worsen or fail to improve.     Roselie Mood, NP

## 2024-09-01 NOTE — Patient Instructions (Signed)
 Go for CXR Start albuterol  and robitussin Dm or mucinex  Dm for cough Use space with albuterol  inhaler

## 2024-09-11 ENCOUNTER — Other Ambulatory Visit: Payer: Self-pay

## 2024-09-18 MED ORDER — CARVEDILOL 3.125 MG PO TABS: 3.1250 mg | ORAL_TABLET | Freq: Two times a day (BID) | ORAL | 3 refills | Status: AC

## 2024-10-09 ENCOUNTER — Encounter: Payer: Self-pay | Admitting: Nurse Practitioner

## 2024-10-09 ENCOUNTER — Ambulatory Visit

## 2024-10-09 ENCOUNTER — Telehealth: Payer: Self-pay | Admitting: Nurse Practitioner

## 2024-10-09 ENCOUNTER — Ambulatory Visit (INDEPENDENT_AMBULATORY_CARE_PROVIDER_SITE_OTHER): Admitting: Nurse Practitioner

## 2024-10-09 VITALS — BP 120/68 | HR 71 | Temp 97.4°F | Ht <= 58 in | Wt 114.4 lb

## 2024-10-09 DIAGNOSIS — R7989 Other specified abnormal findings of blood chemistry: Secondary | ICD-10-CM

## 2024-10-09 DIAGNOSIS — R634 Abnormal weight loss: Secondary | ICD-10-CM | POA: Insufficient documentation

## 2024-10-09 DIAGNOSIS — J189 Pneumonia, unspecified organism: Secondary | ICD-10-CM | POA: Diagnosis not present

## 2024-10-09 NOTE — Patient Instructions (Signed)
 Go to lab for CXR  High-Protein and High-Calorie Diet Eating high-protein and high-calorie foods can help you to gain weight, heal after an injury, and get better after an illness or surgery. The amount of daily protein and calories you need depends on: Your body weight. The reason you were told to follow this diet. Usually, a high-protein, high-calorie diet means that you should: Eat 250-500 extra calories each day. Make sure that you get enough of your daily calories from protein. Ask your health care provider how many of your calories should come from protein and how many calories total you need each day. Follow the diet as told by your provider. What are tips for following this plan? Reading food labels Check the nutrition facts label for calories, and grams of fat and protein. Items with more than 4 grams of protein are high-protein foods. General information  Ask your provider if you should take a nutritional supplement. Try to eat six small meals each day instead of three large meals. A goal is usually to eat every 2 to 3 hours. Eat a balanced diet. In each meal, include one food that's high in protein and one food with fat in it. Keep nutritious snacks available, such as nuts, trail mixes, dried fruit, and whole-milk yogurt. If you have kidney disease or diabetes, talk with your provider about how much protein is safe for you. Too much protein may put extra stress on your kidneys. Replace zero-calorie drinks with drinks that have calories in them, such as milk and 100% fruit juice. Consider setting a timer to remind you to eat. You'll want to eat even if you do not feel very hungry. Preparing meals Milk and dairy foods. Add whole milk, half-and-half, or heavy cream to cereal, pudding, soup, or hot cocoa. Add whole milk to instant breakfast drinks. Add powdered milk to baked goods, smoothies, or milkshakes. Add powdered milk, cream, or butter to mashed potatoes. Replace water  with milk or heavy cream when making foods such as oatmeal, pudding, or cocoa. Make cream-based pastas and soups. Add cheese to cooked vegetables. Make whole-milk yogurt parfaits. Top them with granola, fruit, or nuts. Add cottage cheese to fruit. Add cream cheese to sandwiches or as a topping on crackers and bread. Eggs. Add hard-boiled eggs to salads. Keep hard-boiled eggs in the fridge to snack on. Add cheese to cooked eggs. Beans, nuts, and seeds. Add peanut butter to oatmeal or smoothies. Use peanut butter as a dip for fruits and vegetables or as a topping for pretzels, celery, or crackers. Add beans to casseroles, dips, and spreads. Add pureed beans to sauces and soups. Salads, soups, and other foods. Add avocado, cheese, or both to sandwiches or salads. Add avocado to smoothies. Add meat, poultry, or seafood to rice, pasta, casseroles, salads, and soups. Use mayonnaise when making egg salad, chicken salad, or tuna salad. Add oil or butter to cooked vegetables and grains. What high-protein foods should I eat?  Vegetables Soybeans. Peas. Grains Quinoa. Bulgur wheat. Buckwheat. Meats and other proteins Beef, pork, and poultry. Fish and seafood. Eggs. Tofu. Textured vegetable protein (TVP). Peanut butter. Nuts and seeds. Dried beans. Protein powders. Hummus. Jerky. Dairy Whole milk. Whole-milk yogurt. Powdered milk. Cheese. Cottage cheese. Eggnog. Beverages High-protein supplement drinks. Soy milk. Other foods Protein bars. The items listed above may not be all the foods and drinks you can have. Talk with an expert in healthy eating called a dietitian to learn more. What high-calorie foods should I eat?  Fruits Dried fruit. Fruit leather. Canned fruit in syrup. Fruit juice. Avocado. Vegetables Vegetables cooked in oil or butter. Fried potatoes. Grains Pasta. Quick breads. Muffins. Pancakes. Granola. Meats and other proteins Peanut butter and other nut butters. Nuts and  seeds. Dairy Heavy cream. Whipped cream. Cream cheese. Sour cream. Ice cream. Custard. Pudding. Whole-milk dairy products. Beverages Meal-replacement beverages. Nutrition shakes. Fruit juice. Seasonings and condiments Salad dressing. Mayonnaise. Alfredo sauce. Fruit preserves or jelly. Honey. Syrup. Sweets and desserts Cake. Cookies. Pie. Pastries. Candy bars. Chocolate. Fats and oils Butter or margarine. Oil. Gravy. Other foods Meal-replacement bars. The items listed above may not be all the foods and drinks you can have. Talk with an expert in healthy eating to learn more. This information is not intended to replace advice given to you by your health care provider. Make sure you discuss any questions you have with your health care provider. Document Revised: 04/05/2023 Document Reviewed: 04/05/2023 Elsevier Patient Education  2024 [employer].

## 2024-10-09 NOTE — Telephone Encounter (Signed)
 Called the patient on the number listed for her home and left a detailed voice message per DPR on file stating that Roselie would like for patient to get scheduled for a repeat of thyroid  function which is a lab only appointment prior to 3 month follow up appointment. Asked to give the office a call at [phone] to get scheduled for the lab appointment or if there are any questions. I will try calling patient again

## 2024-10-09 NOTE — Assessment & Plan Note (Signed)
 Resolved cough, no fever or night sweats. She completed 6days of doxycycline , unable to complete due to nausea CXR 08/2024: Irregular nodular density in the lateral left mid lung may represent scarring or focal inflammation. Atherosclerosis of the thoracic aorta. Old left rib fracture.  Repeat CXR today

## 2024-10-09 NOTE — Progress Notes (Signed)
 Established Patient Visit  Patient: [name]   DOB: 11/23/1927   89 y.o. Female  MRN: [id number] Visit Date: 10/09/2024  Subjective:    Chief Complaint  Patient presents with   Follow-up    6 month follow up for HTN, Hyperlipidemia    HPI Accompanied by daughter.  Community acquired pneumonia of left lower lobe of lung Resolved cough, no fever or night sweats. She completed 6days of doxycycline , unable to complete due to nausea CXR 08/2024: Irregular nodular density in the lateral left mid lung may represent scarring or focal inflammation. Atherosclerosis of the thoracic aorta. Old left rib fracture.  Repeat CXR today   Abnormal weight loss 8Lbs weight loss in last 3months. Daughter admits her appetite has decreased, she eats 1-74meals a day. [name], [name] states food does not taste good. She denies any dysphagia or nausea or ABD pain. Hx of intermittent constipation: use of prunes with improvement.  Wt Readings from Last 3 Encounters:  10/09/24 114 lb 6.4 oz (51.9 kg)  09/01/24 119 lb 6.4 oz (54.2 kg)  07/18/24 122 lb 3.2 oz (55.4 kg)    Encouraged to drink 1bottle of ensure daily and meal at least daily with snacks F/up in 3months   Reviewed medical, surgical, and social history today  Medications: Outpatient Medications Prior to Visit  Medication Sig   albuterol  (VENTOLIN  HFA) 108 (90 Base) MCG/ACT inhaler Inhale 2 puffs into the lungs every 6 (six) hours as needed.   amiodarone  (PACERONE ) 200 MG tablet TAKE ONE-HALF TABLET BY MOUTH  DAILY   carvedilol  (COREG ) 3.125 MG tablet Take 1 tablet (3.125 mg total) by mouth 2 (two) times daily with a meal.   clopidogrel  (PLAVIX ) 75 MG tablet TAKE 1 TABLET BY MOUTH DAILY  WITH BREAKFAST   dextromethorphan-guaiFENesin  (MUCINEX  DM) 30-600 MG 12hr tablet Take 1 tablet by mouth 2 (two) times daily as needed for cough.   Evolocumab  (REPATHA  SURECLICK) 140 MG/ML SOAJ INJECT 1 PEN SUBCUTANEOUSLY  EVERY 2  WEEKS   furosemide  (LASIX ) 20 MG tablet TAKE 1 TABLET BY MOUTH 3 TIMES  WEEKLY   nitroGLYCERIN  (NITROSTAT ) 0.4 MG SL tablet DISSOLVE 1 TABLET UNDER THE  TONGUE EVERY 5 MINUTES AS NEEDED FOR CHEST PAIN. MAX OF 3 TABLETS IN 15 MINUTES. CALL 911 IF PAIN  PERSISTS.   Spacer/Aero-Holding Chambers DEVI 1 Units by Does not apply route as needed.   spironolactone  (ALDACTONE ) 25 MG tablet TAKE 1 TABLET BY MOUTH DAILY   No facility-administered medications prior to visit.   Reviewed past medical and social history.   ROS per HPI above  Last CBC Lab Results  Component Value Date   WBC 6.3 07/18/2024   HGB 13.6 07/18/2024   HCT 40.8 07/18/2024   MCV 95.4 07/18/2024   MCH 32.1 12/02/2023   RDW 14.1 07/18/2024   PLT 253.0 07/18/2024   Last metabolic panel Lab Results  Component Value Date   GLUCOSE 101 (H) 07/18/2024   NA 137 07/18/2024   K 3.7 07/18/2024   CL 99 07/18/2024   CO2 28 07/18/2024   BUN 17 07/18/2024   CREATININE 1.09 07/18/2024   GFR 43.47 (L) 07/18/2024   CALCIUM  9.0 07/18/2024   PHOS 3.3 10/11/2023   PROT 7.2 07/18/2024   ALBUMIN 3.8 07/18/2024   BILITOT 0.5 07/18/2024   ALKPHOS 65 07/18/2024   AST 18 07/18/2024   ALT 9 07/18/2024   ANIONGAP 9  12/02/2023   Last hemoglobin A1c Lab Results  Component Value Date   HGBA1C 6.2 07/18/2024   Last thyroid  functions Lab Results  Component Value Date   TSH 15.70 (H) 07/18/2024      Objective:  BP 120/68 (BP Location: Right Arm, Patient Position: Sitting, Cuff Size: Normal)   Pulse 71   Temp (!) 97.4 F (36.3 C) (Oral)   Ht 4' 10 (1.473 m)   Wt 114 lb 6.4 oz (51.9 kg)   SpO2 96%   BMI 23.91 kg/m      Physical Exam Vitals and nursing note reviewed.  Cardiovascular:     Rate and Rhythm: Normal rate and regular rhythm.     Pulses: Normal pulses.     Heart sounds: Normal heart sounds.  Pulmonary:     Effort: Pulmonary effort is normal.     Breath sounds: Normal breath sounds.  Musculoskeletal:      Right lower leg: No edema.     Left lower leg: No edema.  Neurological:     Mental Status: She is alert and oriented to person, place, and time.     No results found for any visits on 10/09/24.    Assessment & Plan:    Problem List Items Addressed This Visit     Abnormal weight loss   8Lbs weight loss in last 3months. Daughter admits her appetite has decreased, she eats 1-86meals a day. [name], [name] states food does not taste good. She denies any dysphagia or nausea or ABD pain. Hx of intermittent constipation: use of prunes with improvement.  Wt Readings from Last 3 Encounters:  10/09/24 114 lb 6.4 oz (51.9 kg)  09/01/24 119 lb 6.4 oz (54.2 kg)  07/18/24 122 lb 3.2 oz (55.4 kg)    Encouraged to drink 1bottle of ensure daily and meal at least daily with snacks F/up in 3months      Community acquired pneumonia of left lower lobe of lung - Primary   Resolved cough, no fever or night sweats. She completed 6days of doxycycline , unable to complete due to nausea CXR 08/2024: Irregular nodular density in the lateral left mid lung may represent scarring or focal inflammation. Atherosclerosis of the thoracic aorta. Old left rib fracture.  Repeat CXR today       Relevant Orders   DG Chest 2 View   Other Visit Diagnoses       Elevated TSH       Relevant Orders   Thyroid  Panel With TSH      Return in about 3 months (around 01/09/2025) for abnormal weight loss, HTN, hyperlipidemia (fasting).     Roselie Mood, NP

## 2024-10-09 NOTE — Assessment & Plan Note (Signed)
 8Lbs weight loss in last 3months. Daughter admits her appetite has decreased, she eats 1-11meals a day. Mrs, Signor states food does not taste good. She denies any dysphagia or nausea or ABD pain. Hx of intermittent constipation: use of prunes with improvement.  Wt Readings from Last 3 Encounters:  10/09/24 114 lb 6.4 oz (51.9 kg)  09/01/24 119 lb 6.4 oz (54.2 kg)  07/18/24 122 lb 3.2 oz (55.4 kg)    Encouraged to drink 1bottle of ensure daily and meal at least daily with snacks F/up in 3months

## 2024-10-11 ENCOUNTER — Ambulatory Visit: Payer: Self-pay | Admitting: Nurse Practitioner

## 2024-10-11 DIAGNOSIS — R911 Solitary pulmonary nodule: Secondary | ICD-10-CM

## 2024-10-11 NOTE — Telephone Encounter (Signed)
-----   Message from CMA Terrah A sent at 10/11/2024 10:29 AM EST ----- The patient's daughter Stacy Neighbors who is on DPR on file has been notified of the result and verbalized understanding. Mrs. Neighbors stated that she is okay with the CT of the chest. All questions (if  any) were answered.    ----- Message ----- From: Katheen Roselie Rockford, NP Sent: 10/11/2024   9:40 AM EST To: [facility] Team  Persistent left upper lobe mass. CT chest recommended to determine type of mass. Are you ok with this order? No pneumonia Chronic bilateral inflammatory changes noted. No change from previous imaging ----- Message ----- From: Interface, Rad Results In Sent: 10/10/2024  11:37 PM EST To: Roselie Rockford Katheen, NP

## 2024-10-17 ENCOUNTER — Encounter: Payer: Self-pay | Admitting: Cardiovascular Disease

## 2024-10-24 ENCOUNTER — Other Ambulatory Visit

## 2024-10-25 ENCOUNTER — Other Ambulatory Visit: Payer: Self-pay | Admitting: Physician Assistant

## 2024-10-25 ENCOUNTER — Ambulatory Visit
Admission: RE | Admit: 2024-10-25 | Discharge: 2024-10-25 | Disposition: A | Source: Ambulatory Visit | Attending: Nurse Practitioner

## 2024-10-25 DIAGNOSIS — R911 Solitary pulmonary nodule: Secondary | ICD-10-CM

## 2024-10-26 ENCOUNTER — Other Ambulatory Visit: Payer: Self-pay | Admitting: Physician Assistant

## 2024-10-30 MED ORDER — AMIODARONE HCL 200 MG PO TABS: 100.0000 mg | ORAL_TABLET | Freq: Every day | ORAL | 0 refills | Status: AC

## 2024-11-02 ENCOUNTER — Ambulatory Visit: Payer: Self-pay | Admitting: Nurse Practitioner

## 2024-11-08 ENCOUNTER — Other Ambulatory Visit: Payer: Self-pay

## 2024-11-08 DIAGNOSIS — E860 Dehydration: Secondary | ICD-10-CM

## 2024-11-09 MED ORDER — FUROSEMIDE 20 MG PO TABS: ORAL_TABLET | ORAL | 0 refills | Status: DC

## 2024-11-12 ENCOUNTER — Other Ambulatory Visit: Payer: Self-pay | Admitting: Nurse Practitioner

## 2024-11-13 MED ORDER — SPIRONOLACTONE 25 MG PO TABS: 25.0000 mg | ORAL_TABLET | Freq: Every day | ORAL | 2 refills | Status: AC

## 2024-11-30 ENCOUNTER — Other Ambulatory Visit: Payer: Self-pay | Admitting: Physician Assistant

## 2024-11-30 DIAGNOSIS — E860 Dehydration: Secondary | ICD-10-CM

## 2025-01-10 ENCOUNTER — Ambulatory Visit: Admitting: Nurse Practitioner
